# Patient Record
Sex: Male | Born: 1950
Health system: Southern US, Community
[De-identification: ages and names within clinical notes are randomized; demographics above are authoritative.]

## PROBLEM LIST (undated history)

## (undated) DIAGNOSIS — G4730 Sleep apnea, unspecified: Secondary | ICD-10-CM

## (undated) DIAGNOSIS — I469 Cardiac arrest, cause unspecified: Secondary | ICD-10-CM

## (undated) DIAGNOSIS — Z83719 Family history of colon polyps, unspecified: Secondary | ICD-10-CM

## (undated) DIAGNOSIS — E119 Type 2 diabetes mellitus without complications: Secondary | ICD-10-CM

## (undated) DIAGNOSIS — C801 Malignant (primary) neoplasm, unspecified: Secondary | ICD-10-CM

## (undated) DIAGNOSIS — I10 Essential (primary) hypertension: Secondary | ICD-10-CM

## (undated) DIAGNOSIS — I340 Nonrheumatic mitral (valve) insufficiency: Secondary | ICD-10-CM

## (undated) DIAGNOSIS — K1121 Acute sialoadenitis: Secondary | ICD-10-CM

## (undated) DIAGNOSIS — K5730 Diverticulosis of large intestine without perforation or abscess without bleeding: Secondary | ICD-10-CM

## (undated) DIAGNOSIS — J302 Other seasonal allergic rhinitis: Secondary | ICD-10-CM

## (undated) DIAGNOSIS — E785 Hyperlipidemia, unspecified: Secondary | ICD-10-CM

## (undated) DIAGNOSIS — T7840XA Allergy, unspecified, initial encounter: Secondary | ICD-10-CM

## (undated) DIAGNOSIS — Z8371 Family history of colonic polyps: Secondary | ICD-10-CM

## (undated) HISTORY — DX: Sleep apnea, unspecified: G47.30

## (undated) HISTORY — PX: NASAL SINUS SURGERY: SHX719

## (undated) HISTORY — DX: Hyperlipidemia, unspecified: E78.5

## (undated) HISTORY — PX: LUNG LOBECTOMY: SHX167

## (undated) HISTORY — DX: Other seasonal allergic rhinitis: J30.2

## (undated) HISTORY — DX: Family history of colon polyps, unspecified: Z83.719

## (undated) HISTORY — DX: Diverticulosis of large intestine without perforation or abscess without bleeding: K57.30

## (undated) HISTORY — DX: Allergy, unspecified, initial encounter: T78.40XA

## (undated) HISTORY — PX: HERNIA REPAIR: SHX51

## (undated) HISTORY — DX: Cardiac arrest, cause unspecified: I46.9

## (undated) HISTORY — PX: APPENDECTOMY: SHX54

## (undated) HISTORY — DX: Type 2 diabetes mellitus without complications: E11.9

## (undated) HISTORY — DX: Malignant (primary) neoplasm, unspecified: C80.1

## (undated) HISTORY — PX: OTHER SURGICAL HISTORY: SHX169

## (undated) HISTORY — DX: Essential (primary) hypertension: I10

## (undated) HISTORY — DX: Family history of colonic polyps: Z83.71

## (undated) HISTORY — DX: Nonrheumatic mitral (valve) insufficiency: I34.0

## (undated) HISTORY — PX: TONSILLECTOMY AND ADENOIDECTOMY: SUR1326

## (undated) HISTORY — PX: MIDDLE EAR SURGERY: SHX713

## (undated) HISTORY — DX: Acute sialoadenitis: K11.21

---

## 2004-01-31 ENCOUNTER — Ambulatory Visit: Payer: Self-pay | Admitting: Internal Medicine

## 2004-02-23 ENCOUNTER — Ambulatory Visit: Payer: Self-pay | Admitting: Internal Medicine

## 2004-03-15 ENCOUNTER — Ambulatory Visit: Payer: Self-pay | Admitting: Internal Medicine

## 2004-03-24 DIAGNOSIS — T8859XA Other complications of anesthesia, initial encounter: Secondary | ICD-10-CM

## 2004-03-24 HISTORY — DX: Other complications of anesthesia, initial encounter: T88.59XA

## 2004-04-01 ENCOUNTER — Ambulatory Visit: Payer: Self-pay | Admitting: Internal Medicine

## 2004-10-04 ENCOUNTER — Ambulatory Visit: Payer: Self-pay | Admitting: Internal Medicine

## 2004-10-18 ENCOUNTER — Ambulatory Visit: Payer: Self-pay | Admitting: Internal Medicine

## 2005-03-18 ENCOUNTER — Ambulatory Visit: Payer: Self-pay | Admitting: Internal Medicine

## 2005-07-30 ENCOUNTER — Ambulatory Visit: Payer: Self-pay | Admitting: Internal Medicine

## 2005-08-11 ENCOUNTER — Ambulatory Visit: Payer: Self-pay | Admitting: Internal Medicine

## 2006-03-12 ENCOUNTER — Ambulatory Visit: Payer: Self-pay | Admitting: Internal Medicine

## 2006-03-12 LAB — CONVERTED CEMR LAB
ALT: 30 units/L (ref 0–40)
AST: 26 units/L (ref 0–37)
BUN: 14 mg/dL (ref 6–23)
Creatinine, Ser: 1.1 mg/dL (ref 0.4–1.5)
Hgb A1c MFr Bld: 5.9 % (ref 4.6–6.0)

## 2006-03-24 HISTORY — PX: OTHER SURGICAL HISTORY: SHX169

## 2006-03-27 ENCOUNTER — Ambulatory Visit: Payer: Self-pay | Admitting: Internal Medicine

## 2006-06-06 ENCOUNTER — Ambulatory Visit: Payer: Self-pay | Admitting: Family Medicine

## 2006-08-28 ENCOUNTER — Ambulatory Visit: Payer: Self-pay | Admitting: Internal Medicine

## 2006-08-28 DIAGNOSIS — G4733 Obstructive sleep apnea (adult) (pediatric): Secondary | ICD-10-CM | POA: Insufficient documentation

## 2006-08-30 LAB — CONVERTED CEMR LAB
ALT: 33 units/L (ref 0–40)
AST: 19 units/L (ref 0–37)
Total CHOL/HDL Ratio: 6.8
VLDL: 40 mg/dL (ref 0–40)

## 2006-08-31 ENCOUNTER — Encounter: Payer: Self-pay | Admitting: Internal Medicine

## 2007-01-15 ENCOUNTER — Telehealth (INDEPENDENT_AMBULATORY_CARE_PROVIDER_SITE_OTHER): Payer: Self-pay | Admitting: *Deleted

## 2007-02-09 ENCOUNTER — Encounter: Payer: Self-pay | Admitting: Internal Medicine

## 2007-02-09 ENCOUNTER — Ambulatory Visit: Payer: Self-pay | Admitting: Cardiology

## 2007-02-09 ENCOUNTER — Encounter (INDEPENDENT_AMBULATORY_CARE_PROVIDER_SITE_OTHER): Payer: Self-pay | Admitting: Otolaryngology

## 2007-02-09 ENCOUNTER — Inpatient Hospital Stay (HOSPITAL_COMMUNITY): Admission: RE | Admit: 2007-02-09 | Discharge: 2007-02-10 | Payer: Self-pay | Admitting: Otolaryngology

## 2007-02-10 ENCOUNTER — Encounter (INDEPENDENT_AMBULATORY_CARE_PROVIDER_SITE_OTHER): Payer: Self-pay | Admitting: Otolaryngology

## 2007-02-12 ENCOUNTER — Ambulatory Visit: Payer: Self-pay | Admitting: Cardiology

## 2007-02-22 ENCOUNTER — Ambulatory Visit: Payer: Self-pay | Admitting: Internal Medicine

## 2007-02-22 DIAGNOSIS — I469 Cardiac arrest, cause unspecified: Secondary | ICD-10-CM | POA: Insufficient documentation

## 2007-02-22 DIAGNOSIS — Z8674 Personal history of sudden cardiac arrest: Secondary | ICD-10-CM | POA: Insufficient documentation

## 2007-02-28 LAB — CONVERTED CEMR LAB
CO2: 29 meq/L (ref 19–32)
Calcium: 9.7 mg/dL (ref 8.4–10.5)
Chloride: 107 meq/L (ref 96–112)
GFR calc non Af Amer: 73 mL/min
Glucose, Bld: 111 mg/dL — ABNORMAL HIGH (ref 70–99)

## 2007-03-01 ENCOUNTER — Encounter (INDEPENDENT_AMBULATORY_CARE_PROVIDER_SITE_OTHER): Payer: Self-pay | Admitting: *Deleted

## 2007-03-08 ENCOUNTER — Ambulatory Visit: Payer: Self-pay

## 2007-03-12 ENCOUNTER — Ambulatory Visit: Payer: Self-pay | Admitting: Cardiology

## 2007-03-19 ENCOUNTER — Encounter: Payer: Self-pay | Admitting: Internal Medicine

## 2007-03-26 ENCOUNTER — Ambulatory Visit: Payer: Self-pay | Admitting: Internal Medicine

## 2007-03-26 DIAGNOSIS — I10 Essential (primary) hypertension: Secondary | ICD-10-CM | POA: Insufficient documentation

## 2007-03-26 DIAGNOSIS — E8881 Metabolic syndrome: Secondary | ICD-10-CM

## 2007-03-26 DIAGNOSIS — E1159 Type 2 diabetes mellitus with other circulatory complications: Secondary | ICD-10-CM | POA: Insufficient documentation

## 2007-04-12 ENCOUNTER — Telehealth (INDEPENDENT_AMBULATORY_CARE_PROVIDER_SITE_OTHER): Payer: Self-pay | Admitting: *Deleted

## 2007-06-28 ENCOUNTER — Telehealth (INDEPENDENT_AMBULATORY_CARE_PROVIDER_SITE_OTHER): Payer: Self-pay | Admitting: *Deleted

## 2007-07-21 ENCOUNTER — Telehealth (INDEPENDENT_AMBULATORY_CARE_PROVIDER_SITE_OTHER): Payer: Self-pay | Admitting: *Deleted

## 2007-08-19 ENCOUNTER — Telehealth (INDEPENDENT_AMBULATORY_CARE_PROVIDER_SITE_OTHER): Payer: Self-pay | Admitting: *Deleted

## 2007-09-27 ENCOUNTER — Telehealth (INDEPENDENT_AMBULATORY_CARE_PROVIDER_SITE_OTHER): Payer: Self-pay | Admitting: *Deleted

## 2007-10-08 ENCOUNTER — Ambulatory Visit: Payer: Self-pay | Admitting: Internal Medicine

## 2007-10-08 DIAGNOSIS — K5730 Diverticulosis of large intestine without perforation or abscess without bleeding: Secondary | ICD-10-CM | POA: Insufficient documentation

## 2007-10-08 DIAGNOSIS — Z86010 Personal history of colonic polyps: Secondary | ICD-10-CM

## 2007-10-14 ENCOUNTER — Encounter: Payer: Self-pay | Admitting: Internal Medicine

## 2007-10-26 ENCOUNTER — Encounter (INDEPENDENT_AMBULATORY_CARE_PROVIDER_SITE_OTHER): Payer: Self-pay | Admitting: *Deleted

## 2007-10-26 ENCOUNTER — Ambulatory Visit: Payer: Self-pay | Admitting: Internal Medicine

## 2007-10-26 LAB — CONVERTED CEMR LAB
OCCULT 1: NEGATIVE
OCCULT 2: NEGATIVE
OCCULT 3: NEGATIVE

## 2008-02-01 ENCOUNTER — Ambulatory Visit: Payer: Self-pay | Admitting: Internal Medicine

## 2008-02-01 DIAGNOSIS — T887XXA Unspecified adverse effect of drug or medicament, initial encounter: Secondary | ICD-10-CM | POA: Insufficient documentation

## 2008-02-01 LAB — CONVERTED CEMR LAB
Creatinine, Ser: 1.2 mg/dL (ref 0.4–1.5)
HDL: 29.8 mg/dL — ABNORMAL LOW (ref >39.0)
Hgb A1c MFr Bld: 6.2 % — ABNORMAL HIGH (ref 4.6–6.0)
LDL Cholesterol: 70 mg/dL (ref 0–99)
Total CHOL/HDL Ratio: 4.3
VLDL: 28 mg/dL (ref 0–40)

## 2008-02-07 ENCOUNTER — Ambulatory Visit: Payer: Self-pay | Admitting: Internal Medicine

## 2008-02-07 DIAGNOSIS — R7989 Other specified abnormal findings of blood chemistry: Secondary | ICD-10-CM

## 2008-02-07 DIAGNOSIS — E790 Hyperuricemia without signs of inflammatory arthritis and tophaceous disease: Secondary | ICD-10-CM | POA: Insufficient documentation

## 2008-02-24 ENCOUNTER — Ambulatory Visit: Payer: Self-pay | Admitting: Internal Medicine

## 2008-02-28 ENCOUNTER — Ambulatory Visit: Payer: Self-pay | Admitting: Internal Medicine

## 2008-03-01 ENCOUNTER — Encounter (INDEPENDENT_AMBULATORY_CARE_PROVIDER_SITE_OTHER): Payer: Self-pay | Admitting: *Deleted

## 2008-03-07 ENCOUNTER — Ambulatory Visit: Payer: Self-pay | Admitting: Internal Medicine

## 2008-03-07 DIAGNOSIS — R930 Abnormal findings on diagnostic imaging of skull and head, not elsewhere classified: Secondary | ICD-10-CM | POA: Insufficient documentation

## 2008-03-08 ENCOUNTER — Encounter: Payer: Self-pay | Admitting: Internal Medicine

## 2008-03-08 ENCOUNTER — Encounter (INDEPENDENT_AMBULATORY_CARE_PROVIDER_SITE_OTHER): Payer: Self-pay | Admitting: *Deleted

## 2008-03-23 ENCOUNTER — Ambulatory Visit: Payer: Self-pay | Admitting: Internal Medicine

## 2008-03-23 ENCOUNTER — Telehealth (INDEPENDENT_AMBULATORY_CARE_PROVIDER_SITE_OTHER): Payer: Self-pay | Admitting: *Deleted

## 2008-03-23 DIAGNOSIS — I517 Cardiomegaly: Secondary | ICD-10-CM | POA: Insufficient documentation

## 2008-03-24 HISTORY — PX: OTHER SURGICAL HISTORY: SHX169

## 2008-03-29 ENCOUNTER — Encounter: Payer: Self-pay | Admitting: Internal Medicine

## 2008-03-29 ENCOUNTER — Telehealth (INDEPENDENT_AMBULATORY_CARE_PROVIDER_SITE_OTHER): Payer: Self-pay | Admitting: *Deleted

## 2008-03-29 ENCOUNTER — Ambulatory Visit: Payer: Self-pay

## 2008-03-30 ENCOUNTER — Encounter (INDEPENDENT_AMBULATORY_CARE_PROVIDER_SITE_OTHER): Payer: Self-pay | Admitting: *Deleted

## 2008-04-26 ENCOUNTER — Telehealth (INDEPENDENT_AMBULATORY_CARE_PROVIDER_SITE_OTHER): Payer: Self-pay | Admitting: *Deleted

## 2008-07-14 ENCOUNTER — Encounter (INDEPENDENT_AMBULATORY_CARE_PROVIDER_SITE_OTHER): Payer: Self-pay | Admitting: *Deleted

## 2008-07-31 ENCOUNTER — Ambulatory Visit: Payer: Self-pay | Admitting: Internal Medicine

## 2008-08-08 ENCOUNTER — Encounter (INDEPENDENT_AMBULATORY_CARE_PROVIDER_SITE_OTHER): Payer: Self-pay | Admitting: *Deleted

## 2008-08-08 ENCOUNTER — Ambulatory Visit: Payer: Self-pay | Admitting: Internal Medicine

## 2008-10-10 ENCOUNTER — Telehealth (INDEPENDENT_AMBULATORY_CARE_PROVIDER_SITE_OTHER): Payer: Self-pay | Admitting: *Deleted

## 2008-11-01 ENCOUNTER — Telehealth (INDEPENDENT_AMBULATORY_CARE_PROVIDER_SITE_OTHER): Payer: Self-pay | Admitting: *Deleted

## 2008-11-13 ENCOUNTER — Ambulatory Visit: Payer: Self-pay | Admitting: Internal Medicine

## 2008-11-13 LAB — CONVERTED CEMR LAB
HDL: 28.6 mg/dL — ABNORMAL LOW (ref >39.00)
Hgb A1c MFr Bld: 6 % (ref 4.6–6.5)
Microalb, Ur: 0.6 mg/dL (ref 0.0–1.9)
Total CHOL/HDL Ratio: 4
VLDL: 46.4 mg/dL — ABNORMAL HIGH (ref 0.0–40.0)

## 2008-11-14 ENCOUNTER — Ambulatory Visit: Payer: Self-pay | Admitting: Internal Medicine

## 2008-12-06 ENCOUNTER — Telehealth (INDEPENDENT_AMBULATORY_CARE_PROVIDER_SITE_OTHER): Payer: Self-pay | Admitting: *Deleted

## 2008-12-25 ENCOUNTER — Ambulatory Visit: Payer: Self-pay | Admitting: Family Medicine

## 2009-01-25 ENCOUNTER — Ambulatory Visit: Payer: Self-pay | Admitting: Internal Medicine

## 2009-03-12 ENCOUNTER — Encounter (INDEPENDENT_AMBULATORY_CARE_PROVIDER_SITE_OTHER): Payer: Self-pay | Admitting: *Deleted

## 2009-05-01 ENCOUNTER — Ambulatory Visit: Payer: Self-pay | Admitting: Family

## 2009-05-01 ENCOUNTER — Encounter (INDEPENDENT_AMBULATORY_CARE_PROVIDER_SITE_OTHER): Payer: Self-pay | Admitting: *Deleted

## 2009-05-21 ENCOUNTER — Encounter (INDEPENDENT_AMBULATORY_CARE_PROVIDER_SITE_OTHER): Payer: Self-pay

## 2009-05-22 ENCOUNTER — Ambulatory Visit: Payer: Self-pay | Admitting: Internal Medicine

## 2009-06-05 ENCOUNTER — Ambulatory Visit: Payer: Self-pay | Admitting: Internal Medicine

## 2009-06-06 ENCOUNTER — Encounter: Payer: Self-pay | Admitting: Internal Medicine

## 2009-08-13 ENCOUNTER — Ambulatory Visit: Payer: Self-pay | Admitting: Internal Medicine

## 2009-08-13 DIAGNOSIS — R7309 Other abnormal glucose: Secondary | ICD-10-CM

## 2009-08-13 DIAGNOSIS — R9431 Abnormal electrocardiogram [ECG] [EKG]: Secondary | ICD-10-CM

## 2009-08-16 ENCOUNTER — Ambulatory Visit: Payer: Self-pay | Admitting: Internal Medicine

## 2009-08-21 LAB — CONVERTED CEMR LAB
Albumin: 4.3 g/dL (ref 3.5–5.2)
Alkaline Phosphatase: 71 units/L (ref 39–117)
BUN: 16 mg/dL (ref 6–23)
Basophils Relative: 0.4 % (ref 0.0–3.0)
CO2: 26 meq/L (ref 19–32)
Calcium: 9.5 mg/dL (ref 8.4–10.5)
Cholesterol: 166 mg/dL (ref 0–200)
Creatinine, Ser: 1.1 mg/dL (ref 0.4–1.5)
Eosinophils Relative: 2.4 % (ref 0.0–5.0)
Glucose, Bld: 118 mg/dL — ABNORMAL HIGH (ref 70–99)
HCT: 41.3 % (ref 39.0–52.0)
HDL: 33 mg/dL — ABNORMAL LOW (ref >39.00)
Lymphs Abs: 1.6 10*3/uL (ref 0.7–4.0)
MCV: 89.9 fL (ref 78.0–100.0)
Monocytes Absolute: 0.4 10*3/uL (ref 0.1–1.0)
Platelets: 169 10*3/uL (ref 150.0–400.0)
Sodium: 141 meq/L (ref 135–145)
Total Protein: 6.8 g/dL (ref 6.0–8.3)
WBC: 6.3 10*3/uL (ref 4.5–10.5)

## 2010-01-07 ENCOUNTER — Ambulatory Visit: Payer: Self-pay | Admitting: Internal Medicine

## 2010-01-07 DIAGNOSIS — J019 Acute sinusitis, unspecified: Secondary | ICD-10-CM | POA: Insufficient documentation

## 2010-01-11 ENCOUNTER — Telehealth: Payer: Self-pay | Admitting: Internal Medicine

## 2010-01-24 ENCOUNTER — Encounter: Payer: Self-pay | Admitting: Internal Medicine

## 2010-01-30 ENCOUNTER — Telehealth: Payer: Self-pay | Admitting: Internal Medicine

## 2010-02-18 ENCOUNTER — Ambulatory Visit: Payer: Self-pay | Admitting: Internal Medicine

## 2010-03-11 ENCOUNTER — Telehealth: Payer: Self-pay | Admitting: Internal Medicine

## 2010-03-13 ENCOUNTER — Ambulatory Visit: Payer: Self-pay | Admitting: Cardiology

## 2010-04-21 LAB — CONVERTED CEMR LAB
Alkaline Phosphatase: 64 units/L (ref 39–117)
Basophils Absolute: 0 10*3/uL (ref 0.0–0.1)
Bilirubin, Direct: 0.1 mg/dL (ref 0.0–0.3)
Cholesterol: 133 mg/dL (ref 0–200)
GFR calc Af Amer: 73 mL/min
GFR calc non Af Amer: 60 mL/min
Glucose, Bld: 111 mg/dL — ABNORMAL HIGH (ref 70–99)
HCT: 38.9 % — ABNORMAL LOW (ref 39.0–52.0)
HDL: 30.9 mg/dL — ABNORMAL LOW (ref >39.0)
LDL Cholesterol: 69 mg/dL (ref 0–99)
MCHC: 34.5 g/dL (ref 30.0–36.0)
Monocytes Absolute: 0.4 10*3/uL (ref 0.1–1.0)
Monocytes Relative: 7.7 % (ref 3.0–12.0)
PSA: 1.09 ng/mL (ref 0.10–4.00)
Platelets: 158 10*3/uL (ref 150–400)
Potassium: 4.3 meq/L (ref 3.5–5.1)
RDW: 11.7 % (ref 11.5–14.6)
Sodium: 138 meq/L (ref 135–145)
TSH: 1.27 microintl units/mL (ref 0.35–5.50)
Total Bilirubin: 0.8 mg/dL (ref 0.3–1.2)
Total CHOL/HDL Ratio: 4.3
Total Protein: 6.5 g/dL (ref 6.0–8.3)
Triglycerides: 168 mg/dL — ABNORMAL HIGH (ref 0–149)
Uric Acid, Serum: 8.1 mg/dL — ABNORMAL HIGH (ref 4.0–7.8)

## 2010-04-25 NOTE — Assessment & Plan Note (Signed)
Summary: sinus infection//lch   Vital Signs:  Patient profile:   60 year old male Weight:      237.4 pounds BMI:     34.68 Temp:     99.6 degrees F oral Pulse rate:   72 / minute Resp:     16 per minute BP sitting:   122 / 80  (left arm) Cuff size:   large  Vitals Entered By: Shonna Chock CMA (January 07, 2010 4:42 PM) CC: Sinus Infection: scratchy throat, discolored sinus drainage, chest congestion and cough, URI symptoms   CC:  Sinus Infection: scratchy throat, discolored sinus drainage, chest congestion and cough, and URI symptoms.  History of Present Illness: URI Symptoms      This is a 60 year old man who presents with URI symptoms since 10/14 . Initially he had a ST then head congestion.  The patient reports nasal congestion, purulent nasal discharge, sore throat, and dry cough, but denies earache.  Associated symptoms include low-grade fever (<100.5 degrees).  The patient denies dyspnea and wheezing.  The patient denies headache ,bilateral facial pain, tooth pain, Strep exposure,  or tender adenopathy. Rx: Zyrtec, Advil, Riccola lozenges   Current Medications (verified): 1)  Vytorin 10-20 Mg Tabs (Ezetimibe-Simvastatin) .... Take One Tablet At Bedtime 2)  Labetalol Hcl 300 Mg  Tabs (Labetalol Hcl) .... 1/2 Two Times A Day 3)  Bayer Low Strength 81 Mg Tbec (Aspirin) .Marland Kitchen.. 1 By Mouth Once Daily 4)  Aldactone 25 Mg Tabs (Spironolactone) .Marland Kitchen.. 1 By Mouth Two Times A Day To Keep B/p Average 135/85 5)  Micardis 80 Mg Tabs (Telmisartan) .Marland Kitchen.. 1 By Mouth Once Daily  Allergies: 1)  ! Pcn 2)  ! Pcn 3)  ! Tiazac (Diltiazem Hcl Er Beads) 4)  * Tiazac  Physical Exam  General:  well-nourished,in no acute distress; alert,appropriate and cooperative throughout examination Ears:  External ear exam shows no significant lesions or deformities.  Otoscopic examination reveals clear canals, tympanic membranes are intact bilaterally without bulging, retraction, inflammation or discharge. Hearing  is grossly normal bilaterally.Minor TM scarring Nose:  External nasal examination shows no deformity or inflammation. Nasal mucosa are pink and moist without lesions or exudates. Slight septal deviation Mouth:  Oral mucosa and oropharynx without lesions or exudates.  Teeth in good repair. Mild pharyngeal erythema.   Hoarse Lungs:  Normal respiratory effort, chest expands symmetrically. Lungs are clear to auscultation, no crackles or wheezes. Heart:  Normal rate and regular rhythm. S1 and S2 normal without gallop, murmur, click, rub .S4  Cervical Nodes:  No lymphadenopathy noted Axillary Nodes:  No palpable lymphadenopathy   Impression & Recommendations:  Problem # 1:  SINUSITIS- ACUTE-NOS (ICD-461.9)  His updated medication list for this problem includes:    Smz-tmp Ds 800-160 Mg Tabs (Sulfamethoxazole-trimethoprim) .Marland Kitchen... 1 two times a day with 8 oz of water  Complete Medication List: 1)  Vytorin 10-20 Mg Tabs (Ezetimibe-simvastatin) .... Take one tablet at bedtime 2)  Labetalol Hcl 300 Mg Tabs (Labetalol hcl) .... 1/2 two times a day 3)  Bayer Low Strength 81 Mg Tbec (Aspirin) .Marland Kitchen.. 1 by mouth once daily 4)  Aldactone 25 Mg Tabs (Spironolactone) .Marland Kitchen.. 1 by mouth two times a day to keep b/p average 135/85 5)  Micardis 80 Mg Tabs (Telmisartan) .Marland Kitchen.. 1 by mouth once daily 6)  Smz-tmp Ds 800-160 Mg Tabs (Sulfamethoxazole-trimethoprim) .Marland Kitchen.. 1 two times a day with 8 oz of water  Patient Instructions: 1)  Netri pot once daily until sinuses are  clear. 2)  Drink as much  NON dairy fluid as you can tolerate for the next few days. Nasonex spray two times a day as "crossover " technique. Prescriptions: SMZ-TMP DS 800-160 MG TABS (SULFAMETHOXAZOLE-TRIMETHOPRIM) 1 two times a day with 8 oz of water  #20 x 0   Entered and Authorized by:   Marga Melnick MD   Signed by:   Marga Melnick MD on 01/07/2010   Method used:   Faxed to ...       [facility]* (retail)       [address]        [place], [place]  [address]       Ph: [phone]       Fax: [phone]   RxID:   [id number]    Orders Added: 1)  Est. Patient Level III [03474]

## 2010-04-25 NOTE — Letter (Signed)
Summary: [facility] Instructions  [facility]  [address] [place], [place] [address]   Phone: [phone]  Fax: [phone]       JORGEN WOLFINGER    Apr 22, 1950    MRN: [id number]       Procedure Day /Date: Tuesday 06/05/09     Arrival Time:  9:30 am     Procedure Time: 10:30 am     Location of Procedure:                    _x_  [facility] (4th Floor)        PREPARATION FOR COLONOSCOPY WITH MIRALAX  Starting 5 days prior to your procedure  05/31/09 do not eat nuts, seeds, popcorn, corn, beans, peas,  salads, or any raw vegetables.  Do not take any fiber supplements (e.g. Metamucil, Citrucel, and Benefiber). ____________________________________________________________________________________________________   THE DAY BEFORE YOUR PROCEDURE         DATE: 06/04/09 DAY: Monday  1   Drink clear liquids the entire day-NO SOLID FOOD  2   Do not drink anything colored red or purple.  Avoid juices with pulp.  No orange juice.  3   Drink at least 64 oz. (8 glasses) of fluid/clear liquids during the day to prevent dehydration and help the prep work efficiently.  CLEAR LIQUIDS INCLUDE: Water Jello Ice Popsicles Tea (sugar ok, no milk/cream) Powdered fruit flavored drinks Coffee (sugar ok, no milk/cream) Gatorade Juice: apple, white grape, white cranberry  Lemonade Clear bullion, consomm, broth Carbonated beverages (any kind) Strained chicken noodle soup Hard Candy  4   Mix the entire bottle of Miralax with 64 oz. of Gatorade/Powerade in the morning and put in the refrigerator to chill.  5   At 3:00 pm take 2 Dulcolax/Bisacodyl tablets.  6   At 4:30 pm take one Reglan/Metoclopramide tablet.  7  Starting at 5:00 pm drink one 8 oz glass of the Miralax mixture every 15-20 minutes until you have finished drinking the entire 64 oz.  You should finish drinking prep around 7:30 or 8:00 pm.  8   If you are nauseated, you may take the 2nd  Reglan/Metoclopramide tablet at 6:30 pm.        9    At 8:00 pm take 2 more DULCOLAX/Bisacodyl tablets.     THE DAY OF YOUR PROCEDURE      DATE:  06/05/09 DAY: Tuesday  You may drink clear liquids until 8:30 am (2 HOURS BEFORE PROCEDURE).   MEDICATION INSTRUCTIONS  Unless otherwise instructed, you should take regular prescription medications with a small sip of water as early as possible the morning of your procedure.   Additional medication instructions: Do not take fluid pill am of procedure         OTHER INSTRUCTIONS  You will need a responsible adult at least 61 years of age to accompany you and drive you home.   This person must remain in the waiting room during your procedure.  Wear loose fitting clothing that is easily removed.  Leave jewelry and other valuables at home.  However, you may wish to bring a book to read or an iPod/MP3 player to listen to music as you wait for your procedure to start.  Remove all body piercing jewelry and leave at home.  Total time from sign-in until discharge is approximately 2-3 hours.  You should go home directly after your procedure and rest.  You can resume normal activities the day after  your procedure.  The day of your procedure you should not:   Drive   Make legal decisions   Operate machinery   Drink alcohol   Return to work  You will receive specific instructions about eating, activities and medications before you leave.   The above instructions have been reviewed and explained to me by   Ulis Rias RN  May 22, 2009 8:31 AM     I fully understand and can verbalize these instructions _____________________________ Date _______

## 2010-04-25 NOTE — Procedures (Signed)
Summary: Colonoscopy  Patient: Jonathon Armstrong Note: All result statuses are Final unless otherwise noted.  Tests: (1) Colonoscopy (COL)   COL Colonoscopy           DONE     [facility]     [address]     [place], [place]  [address]           COLONOSCOPY PROCEDURE REPORT           PATIENT:  Jonathon, Armstrong  MR#:  [id number]     BIRTHDATE:  06-20-1950, 58 yrs. old  GENDER:  male           ENDOSCOPIST:  Hedwig Morton. Juanda Chance, MD     Referred by:           PROCEDURE DATE:  06/05/2009     PROCEDURE:  Colonoscopy [address]     ASA CLASS:  Class I     INDICATIONS:  adenomatous polyp in 03/2004           MEDICATIONS:   Versed 9 mg, Fentanyl 100 mcg           DESCRIPTION OF PROCEDURE:   After the risks benefits and     alternatives of the procedure were thoroughly explained, informed     consent was obtained.  Digital rectal exam was performed and     revealed no rectal masses.   The LB [id number] endoscope     was introduced through the anus and advanced to the cecum, which     was identified by both the appendix and ileocecal valve, without     limitations.  The quality of the prep was good, using MiraLax.     The instrument was then slowly withdrawn as the colon was fully     examined.     <<PROCEDUREIMAGES>>           FINDINGS:  Two polyps were found in the sigmoid colon. at 30 and     35 cm 2-3 mm sessile polyps removed The polyps were removed using     cold biopsy forceps (see image1).  Mild diverticulosis was found     in the sigmoid colon (see image3).  This was otherwise a normal     examination of the colon (see image4, image5, image6, and image9).     Retroflexed views in the rectum revealed no abnormalities.    The     scope was then withdrawn from the patient and the procedure     completed.           COMPLICATIONS:  None           ENDOSCOPIC IMPRESSION:     1) Two polyps in the sigmoid colon     2) Mild diverticulosis in the sigmoid colon     3) Otherwise  normal examination     RECOMMENDATIONS:     1) Await pathology results     2) high fiber diet           REPEAT EXAM:  In 5 - 7 year(s) for.  recall will depend on type of     polyps           ______________________________     Hedwig Morton. Juanda Chance, MD           CC:           n.     eSIGNED:   Hedwig Morton. Gwenlyn Hottinger at 06/05/2009 11:23 AM  Jonathon, Armstrong, [id number]  Note: An exclamation mark (!) indicates a result that was not dispersed into the flowsheet. Document Creation Date: 06/05/2009 11:23 AM _______________________________________________________________________  (1) Order result status: Final Collection or observation date-time: 06/05/2009 11:15 Requested date-time:  Receipt date-time:  Reported date-time:  Referring Physician:   Ordering Physician: Lina Sar [phone]) Specimen Source:  Source: Launa Grill Order Number: [id number] Lab site:   Appended Document: Colonoscopy     Procedures Next Due Date:    Colonoscopy: 05/2014

## 2010-04-25 NOTE — Letter (Signed)
Summary: Patient Notice- Polyp Results  [facility]  [address] [place], [place] [address]   Phone: [phone]  Fax: [phone]        June 06, 2009 MRN: [id number]    Jonathon Armstrong [address] [place], [place]  [address]    Dear [name],  I am pleased to inform you that the colon polyp(s) removed during your recent colonoscopy was (were) found to be benign (no cancer detected) upon pathologic examination.The polyp was  adenomatous ( precancerous).  I recommend you have a repeat colonoscopy examination in 5_ years to look for recurrent polyps, as having colon polyps increases your risk for having recurrent polyps or even colon cancer in the future.  Should you develop new or worsening symptoms of abdominal pain, bowel habit changes or bleeding from the rectum or bowels, please schedule an evaluation with either your primary care physician or with me.  Additional information/recommendations:  _x_ No further action with gastroenterology is needed at this time. Please      follow-up with your primary care physician for your other healthcare      needs.  __ Please call [phone] to schedule a return visit to review your      situation.  __ Please keep your follow-up visit as already scheduled.  __ Continue treatment plan as outlined the day of your exam.  Please call us if you are having persistent problems or have questions about your condition that have not been fully answered at this time.  Sincerely,  Hart Carwin MD  This letter has been electronically signed by your physician.  Appended Document: Patient Notice- Polyp Results Letter mailed 3.21.11

## 2010-04-25 NOTE — Assessment & Plan Note (Signed)
Summary: FLU SHOT/RH.......   Nurse Visit  CC: Flu shot./kb   Allergies: 1)  ! Pcn 2)  ! Pcn 3)  ! Tiazac (Diltiazem Hcl Er Beads) 4)  * Tiazac  Orders Added: 1)  Admin 1st Vaccine [90471] 2)  Flu Vaccine 72yrs + [[address]]      Flu Vaccine Consent Questions     Do you have a history of severe allergic reactions to this vaccine? no    Any prior history of allergic reactions to egg and/or gelatin? no    Do you have a sensitivity to the preservative Thimersol? no    Do you have a past history of Guillan-Barre Syndrome? no    Do you currently have an acute febrile illness? no    Have you ever had a severe reaction to latex? no    Vaccine information given and explained to patient? yes    Are you currently pregnant? no    Lot Number:[id number]   Exp Date:09/21/2010   Site Given  Left Deltoid IMu

## 2010-04-25 NOTE — Letter (Signed)
Summary: [facility]   Imported By: Lanelle Bal 02/05/2010 10:34:34  _____________________________________________________________________  External Attachment:    Type:   Image     Comment:   External Document

## 2010-04-25 NOTE — Miscellaneous (Signed)
Summary: Lec previsit  Clinical Lists Changes  Medications: Added new medication of MIRALAX   POWD (POLYETHYLENE GLYCOL 3350) As per prep  instructions. - Signed Added new medication of DULCOLAX 5 MG  TBEC (BISACODYL) Day before procedure take 2 at 3pm and 2 at 8pm. - Signed Added new medication of REGLAN 10 MG  TABS (METOCLOPRAMIDE HCL) As per prep instructions. - Signed Rx of MIRALAX   POWD (POLYETHYLENE GLYCOL 3350) As per prep  instructions.;  #255gm x 0;  Signed;  Entered by: Ulis Rias RN;  Authorized by: Hart Carwin MD;  Method used: Electronically to [facility] [address], [place], [place]  [address], Ph: [phone], Fax: [phone] Rx of DULCOLAX 5 MG  TBEC (BISACODYL) Day before procedure take 2 at 3pm and 2 at 8pm.;  #4 x 0;  Signed;  Entered by: Ulis Rias RN;  Authorized by: Hart Carwin MD;  Method used: Electronically to [facility]*, [address] [place], [place]  [address], Ph: [phone], Fax: [phone] Rx of REGLAN 10 MG  TABS (METOCLOPRAMIDE HCL) As per prep instructions.;  #2 x 0;  Signed;  Entered by: Ulis Rias RN;  Authorized by: Hart Carwin MD;  Method used: Electronically to [facility]*, [address], [place], [place]  [address], Ph: [phone], Fax: [phone] Observations: Added new observation of ALLERGY REV: Done (05/22/2009 8:12)    Prescriptions: REGLAN 10 MG  TABS (METOCLOPRAMIDE HCL) As per prep instructions.  #2 x 0   Entered by:   Ulis Rias RN   Authorized by:   Hart Carwin MD   Signed by:   Ulis Rias RN on 05/22/2009   Method used:   Electronically to        [facility]* (retail)       [address]       [place], [place]  [address]       Ph: [phone]       Fax: [phone]   RxID:   [id number] DULCOLAX 5 MG  TBEC (BISACODYL) Day before procedure take 2 at 3pm and 2 at 8pm.  #4 x 0   Entered by:   Ulis Rias RN   Authorized by:   Hart Carwin MD   Signed by:   Ulis Rias RN on 05/22/2009   Method used:   Electronically to        [facility]* (retail)       [address]       [place], [place]  [address]       Ph: [phone]       Fax: [phone]   RxID:   [id number] MIRALAX   POWD (POLYETHYLENE GLYCOL 3350) As per prep  instructions.  #255gm x 0   Entered by:   Ulis Rias RN   Authorized by:   Hart Carwin MD   Signed by:   Ulis Rias RN on 05/22/2009   Method used:   Electronically to        [facility]* (retail)       [address]       [place], [place]  [address]       Ph: [phone]       Fax: [phone]   RxID:   [id number]

## 2010-04-25 NOTE — Assessment & Plan Note (Signed)
Summary: cpx/cbs   Vital Signs:  Patient profile:   60 year old male Height:      69.5 inches Weight:      230.8 pounds BMI:     33.72 Temp:     98.8 degrees F oral Pulse rate:   76 / minute Resp:     14 per minute BP sitting:   142 / 90  (left arm) Cuff size:   large  Vitals Entered By: Shonna Chock (Aug 13, 2009 1:16 PM)  CC: CPX and refill meds (out of meds x 1 week), General Medical Evaluation Comments REVIEWED MED LIST, PATIENT AGREED DOSE AND INSTRUCTION CORRECT    CC:  CPX and refill meds (out of meds x 1 week) and General Medical Evaluation.  History of Present Illness: Jonathon Armstrong is here for a physical; he is asymptomatic .See BP; it averages 120/85 @ home . He has been out of meds for > 1 week.  Preventive Screening-Counseling & Management  Caffeine-Diet-Exercise     Does Patient Exercise: no  Allergies: 1)  ! Pcn 2)  ! Pcn 3)  ! Tiazac (Diltiazem Hcl Er Beads) 4)  * Tiazac  Past History:  Past Medical History: Hyperlipidemia Hypertension Elevated Glucose(Fasting Hyperglycemia) Obstructive sleep apnea, CPAP  Asystole , "vagal response " post nasal polypectomy Colonic polyps, hx of Diverticulosis, colon Mitral Regurgitation , mild & Aortic Valve calcification on 2D ECHO; SBE Prophylaxis  Past Surgical History: Sinus surgery, Polypectomy Parotitis, no surgery Herniorraphy x 2 L  TM replacement Appendectomy Colonoscopy 2006 & 2011 : polyps, Tics, Dr Juanda Chance, due 2015 ; Asystole post nasal polypectomy 02/09/2007  Family History: Father: MI in 68s, HTN,dementia Mother: angina,CABG,MI late 60's, cancer in  liver, Multiple Myeloma ZOX:WRUEAV in bone   Social History: Never Smoked Alcohol use-yes-socially No diet Regular exercise-no Retired Does Patient Exercise:  no  Review of Systems  The patient denies anorexia, fever, weight loss, weight gain, vision loss, decreased hearing, hoarseness, chest pain, syncope, dyspnea on exertion, peripheral  edema, prolonged cough, headaches, hemoptysis, abdominal pain, melena, hematochezia, severe indigestion/heartburn, hematuria, suspicious skin lesions, depression, unusual weight change, abnormal bleeding, enlarged lymph nodes, and angioedema.    Physical Exam  General:  well-nourished; alert,appropriate and cooperative throughout examination Head:  Normocephalic and atraumatic without obvious abnormalities. No apparent alopecia Eyes:  No corneal or conjunctival inflammation noted.  Perrla. Funduscopic exam benign, without hemorrhages, exudates or papilledema.  Ears:  External ear exam shows no significant lesions or deformities.  Otoscopic examination reveals clear canals, tympanic membranes are intact bilaterally without bulging, retraction, inflammation or discharge. Hearing is grossly normal bilaterally. Nose:  External nasal examination shows no deformity or inflammation. Nasal mucosa are pink and moist without lesions or exudates. Mouth:  Oral mucosa and oropharynx without lesions or exudates.  Teeth in good repair. Neck:  No deformities, masses, or tenderness noted. Lungs:  Normal respiratory effort, chest expands symmetrically. Lungs are clear to auscultation, no crackles or wheezes. Heart:  Normal rate and regular rhythm.  S2 accentuated. No gallop, murmur, click, rub or other extra sounds. Abdomen:  Bowel sounds positive,abdomen soft and non-tender without masses, organomegaly or hernias noted. Rectal:  Colonscopy 05/2009 Genitalia:  Testes bilaterally descended without nodularity, tenderness or masses. No scrotal masses or lesions. No penis lesions or urethral discharge. L varicocele.   Msk:  No deformity or scoliosis noted of thoracic or lumbar spine.   Pulses:  R and L carotid,radial,dorsalis pedis and posterior tibial pulses are full and equal bilaterally  Extremities:  No clubbing, cyanosis, edema. DIP  deformity noted  3rd R finger with normal full range of motion of all joints.     Neurologic:  alert & oriented X3 and DTRs symmetrical and 0-1/2+ Skin:  Intact without suspicious lesions or rashes Cervical Nodes:  No lymphadenopathy noted Axillary Nodes:  No palpable lymphadenopathy Inguinal Nodes:  No significant adenopathy Psych:  memory intact for recent and remote, normally interactive, and good eye contact.     Impression & Recommendations:  Problem # 1:  ROUTINE GENERAL MEDICAL EXAM@HEALTH  CARE FACL (ICD-V70.0)  Orders: EKG w/ Interpretation ([address])  Problem # 2:  HYPERTENSION, ESSENTIAL NOS (ICD-401.9)  His updated medication list for this problem includes:    Labetalol Hcl 300 Mg Tabs (Labetalol hcl) .Marland Kitchen... 1/2 two times a day    Aldactone 25 Mg Tabs (Spironolactone) .Marland Kitchen... 1 by mouth two times a day to keep b/p average 135/85    Micardis 80 Mg Tabs (Telmisartan) .Marland Kitchen... 1 by mouth once daily  Orders: EKG w/ Interpretation ([address])  Problem # 3:  HYPERGLYCEMIA, FASTING (ICD-790.29)  Problem # 4:  NONSPECIFIC ABNORMAL ELECTROCARDIOGRAM (ICD-794.31)  in context of elevated BP off meds  Orders: EKG w/ Interpretation ([address])  Problem # 5:  HYPERURICEMIA, ASYMPTOMATIC (ICD-790.6)  Problem # 6:  CARDIOMEGALY, MILD (ICD-429.3)  Mild MR  Orders: EKG w/ Interpretation ([address])  Problem # 7:  OBSTRUCTIVE SLEEP APNEA (ICD-327.23) on CPAP  Complete Medication List: 1)  Vytorin 10-20 Mg Tabs (Ezetimibe-simvastatin) .... Take one tablet at bedtime 2)  Labetalol Hcl 300 Mg Tabs (Labetalol hcl) .... 1/2 two times a day 3)  Bayer Low Strength 81 Mg Tbec (Aspirin) .Marland Kitchen.. 1 by mouth once daily 4)  Aldactone 25 Mg Tabs (Spironolactone) .Marland Kitchen.. 1 by mouth two times a day to keep b/p average 135/85 5)  Micardis 80 Mg Tabs (Telmisartan) .Marland Kitchen.. 1 by mouth once daily  Patient Instructions: 1)  It is important that you exercise regularly at least 20 minutes 5 times a week. If you develop chest pain, have severe difficulty breathing, or feel very tired , stop exercising  immediately and seek medical attention.Take an Aspirin every day.Please schedule fasting labs: 2)  BMP ;Hepatic Panel ;Lipid Panel ;TSH ;CBC w/ Diff ;PSA;HbgA1C . Prescriptions: VYTORIN 10-20 MG TABS (EZETIMIBE-SIMVASTATIN) TAKE ONE TABLET AT BEDTIME  #30 Each x 11   Entered and Authorized by:   Marga Melnick MD   Signed by:   Marga Melnick MD on 08/13/2009   Method used:   Print then Give to Patient   RxID:   [id number] MICARDIS 80 MG TABS (TELMISARTAN) 1 by mouth once daily  #90 Each x 3   Entered and Authorized by:   Marga Melnick MD   Signed by:   Marga Melnick MD on 08/13/2009   Method used:   Faxed to ...       [facility]* (retail)       [address]       [place], [place]  [address]       Ph: [phone]       Fax: [phone]   RxID:   [id number] ALDACTONE 25 MG TABS (SPIRONOLACTONE) 1 by mouth two times a day TO KEEP B/P AVERAGE 135/85  #180 Each x 3   Entered and Authorized by:   Marga Melnick MD   Signed by:   Marga Melnick MD on 08/13/2009   Method used:   Faxed to ...       [facility]* (retail)       [address]       [place], [place]  [address]       Ph: [phone]       Fax: [phone]   RxID:   [id number] LABETALOL HCL 300 MG  TABS (LABETALOL HCL) 1/2 two times a day  #90 x 3   Entered and Authorized by:   Marga Melnick MD   Signed by:   Marga Melnick MD on 08/13/2009   Method used:   Faxed to ...       [facility]* (retail)       [address]       [place], [place]  [address]       Ph: [phone]       Fax: [phone]   RxID:   [id number]

## 2010-04-25 NOTE — Assessment & Plan Note (Signed)
Summary: bronchtsis/kdc   Vital Signs:  Patient profile:   60 year old male Weight:      229 pounds O2 Sat:      97 % on Room air Temp:     98.0 degrees F oral Pulse rate:   67 / minute BP sitting:   130 / 90  (left arm)  Vitals Entered By: Jonathon Armstrong CMA (May 01, 2009 3:41 PM)  O2 Flow:  Room air CC: ? bronchitis,sore throat, dry cough, chest congestion Comments REVIEWED MED LIST, PATIENT AGREED DOSE AND INSTRUCTION CORRECT    CC:  ? bronchitis, sore throat, dry cough, and chest congestion.  History of Present Illness: Jonathon Armstrong is a 60 year old male who presents with sore throat on saturday.  Yesterday he developed a deep cough.  Voice is hoarse.  Notes that he has history of mitral valve problems for which he takes dental prophylaxis. He has an upcoming dental procedure pending.  Denies fever, has taken zyrtec and mucinex-D, and cough syrup without any improvment.  Allergies: 1)  ! Pcn 2)  ! Pcn 3)  ! Tiazac (Diltiazem Hcl Er Beads) 4)  * Tiazac  Physical Exam  General:  Well-developed,well-nourished,in no acute distress; alert,appropriate and cooperative throughout examination Eyes:  PERRLA Ears:  bilateral TM scarring without erythema or bulging Mouth:  Oral mucosa and oropharynx without lesions or exudates.  Teeth in good repair. Neck:  No deformities, masses, or tenderness noted. Lungs:  Normal respiratory effort, chest expands symmetrically. Lungs are clear to auscultation, no crackles or wheezes. Heart:  Normal rate and regular rhythm. S1 and S2 normal without gallop, murmur, click, rub or other extra sounds.   Impression & Recommendations:  Problem # 1:  BRONCHITIS, MILD (ICD-490) Assessment New Will plan to treat patient with zithromax.  Refill promethazine-codeine. Patient instructed to call if symptoms worsen or do not improve in 1 week. His updated medication list for this problem includes:    Promethazine-codeine 6.25-10 Mg/92ml Syrp  (Promethazine-codeine) .Marland Kitchen... 1 tsp q 6 hrs  as needed    Asmanex 120 Metered Doses 220 Mcg/inh Aepb (Mometasone furoate) .Marland Kitchen... 2 inh every 12 hours    Proair Hfa 108 (90 Base) Mcg/act Aers (Albuterol sulfate) .Marland Kitchen... 1-2 every 4 hours as needed  Complete Medication List: 1)  Vytorin 10-20 Mg Tabs (Ezetimibe-simvastatin) .... Take one tablet at bedtime 2)  Labetalol Hcl 300 Mg Tabs (Labetalol hcl) .... 1/2 two times a day 3)  Promethazine-codeine 6.25-10 Mg/3ml Syrp (Promethazine-codeine) .Marland Kitchen.. 1 tsp q 6 hrs  as needed 4)  Freestyle Test Strp (Glucose blood) .... Once daily 5)  Freestyle Lancets Misc (Lancets) .... Once daily 6)  Bayer Low Strength 81 Mg Tbec (Aspirin) .Marland Kitchen.. 1 by mouth once daily 7)  Aldactone 25 Mg Tabs (Spironolactone) .Marland Kitchen.. 1 by mouth two times a day to keep b/p average 135/85 8)  Asmanex 120 Metered Doses 220 Mcg/inh Aepb (Mometasone furoate) .... 2 inh every 12 hours 9)  Proair Hfa 108 (90 Base) Mcg/act Aers (Albuterol sulfate) .Marland Kitchen.. 1-2 every 4 hours as needed 10)  Micardis 80 Mg Tabs (Telmisartan) .Marland Kitchen.. 1 by mouth once daily 11)  Zithromax Z-pak 250 Mg Tabs (Azithromycin) .... 2 tabs by mouth today, then one tab by mouth daily x 4 days  Patient Instructions: 1)  Please call if symptoms worsen or have not resolved in 1 week. Prescriptions: PROMETHAZINE-CODEINE 6.25-10 MG/5ML SYRP (PROMETHAZINE-CODEINE) 1 tsp q 6 hrs  as needed  #150cc x 0  Entered and Authorized by:   Lemont Fillers FNP   Signed by:   Lemont Fillers FNP on 05/01/2009   Method used:   Print then Give to Patient   RxID:   [id number] ZITHROMAX Z-PAK 250 MG TABS (AZITHROMYCIN) 2 tabs by mouth today, then one tab by mouth daily x 4 days  #1 pack x 0   Entered and Authorized by:   Lemont Fillers FNP   Signed by:   Lemont Fillers FNP on 05/01/2009   Method used:   Print then Give to Patient   RxID:   [id number]

## 2010-04-25 NOTE — Letter (Signed)
Summary: Colonoscopy Letter  [facility]  [address] [place], [place] [address]   Phone: [phone]  Fax: [phone]      May 01, 2009 MRN: [id number]   VIJAY DURFLINGER [address] [place], [place]  [address]   Dear Mr. Hannay,   According to your medical record, it is time for you to schedule a Colonoscopy. The American Cancer Society recommends this procedure as a method to detect early colon cancer. Patients with a family history of colon cancer, or a personal history of colon polyps or inflammatory bowel disease are at increased risk.  This letter has beeen generated based on the recommendations made at the time of your procedure. If you feel that in your particular situation this may no longer apply, please contact our office.  Please call our office at [phone] to schedule this appointment or to update your records at your earliest convenience.  Thank you for cooperating with [place] to provide you with the very best care possible.   Sincerely,  Hedwig Morton. Juanda Chance, M.D.  [facility] [phone]

## 2010-04-25 NOTE — Progress Notes (Signed)
Summary: ABX request  Phone Note Call from Patient Call back at Home Phone [phone]   Details for Reason: [facility] Summary of Call: Patient called noting that sinus infection has returned. Can he be given another round of ABX? Repeat the same of use different?  Patient was seen on 01/07/10. Please advise. Initial call taken by: Lucious Groves CMA,  January 30, 2010 1:10 PM  Follow-up for Phone Call        see Rx Follow-up by: Marga Melnick MD,  January 30, 2010 2:26 PM  Additional Follow-up for Phone Call Additional follow up Details #1::        Patient notified. Additional Follow-up by: Lucious Groves CMA,  January 30, 2010 2:28 PM    New/Updated Medications: METRONIDAZOLE 500 MG TABS (METRONIDAZOLE) 1 three times a day ; avoid alcoho Prescriptions: METRONIDAZOLE 500 MG TABS (METRONIDAZOLE) 1 three times a day ; avoid alcoho  #21 x 0   Entered and Authorized by:   Marga Melnick MD   Signed by:   Marga Melnick MD on 01/30/2010   Method used:   Faxed to ...       [facility]* (retail)       [address]       [place], [place]  [id number]       Ph: [phone]       Fax: [phone]   RxID:   [id number]

## 2010-04-25 NOTE — Progress Notes (Signed)
Summary: ABX request  Phone Note Call from Patient   Summary of Call: Patient left message on triage that he again is battling a sinus inf and would like that 2nd ABX he received previously sent to [facility]. Pt was given METRONIDAZOLE 500 on 11/9.  Please advise. Initial call taken by: Lucious Groves CMA,  March 11, 2010 11:59 AM  Follow-up for Phone Call        #21 , 1 three times a day; do not take with alcohol . Sinus CT & OV if no better. Follow-up by: Marga Melnick MD,  March 11, 2010 3:39 PM  Additional Follow-up for Phone Call Additional follow up Details #1::        Pt aware, Rx sent to pharmacy, awaiting appt info.........Marland KitchenFelecia Deloach CMA  March 11, 2010 3:49 PM     New/Updated Medications: METRONIDAZOLE 500 MG TABS (METRONIDAZOLE) 1 three times a day ; avoid alcoho Prescriptions: METRONIDAZOLE 500 MG TABS (METRONIDAZOLE) 1 three times a day ; avoid alcoho  #21 x 0   Entered by:   Jeremy Johann CMA   Authorized by:   Marga Melnick MD   Signed by:   Jeremy Johann CMA on 03/11/2010   Method used:   Faxed to ...       [facility]* (retail)       [address]       [place], [place]  [id number]       Ph: [phone]       Fax: [phone]   RxID:   [id number]

## 2010-04-25 NOTE — Progress Notes (Signed)
Summary: cough med  Phone Note Call from Patient Call back at Home Phone [phone]   Summary of Call: Patient called noting that he is taking the med given, but is not that much better and needs a cough med. (seen on Monday) Please advise. Initial call taken by: Lucious Groves CMA,  January 11, 2010 8:59 AM  Follow-up for Phone Call        Patient notified.  Follow-up by: Lucious Groves CMA,  January 11, 2010 3:50 PM    New/Updated Medications: HYDROMET 5-1.5 MG/5ML SYRP (HYDROCODONE-HOMATROPINE) 1 tsp every 6 hrs as needed Prescriptions: HYDROMET 5-1.5 MG/5ML SYRP (HYDROCODONE-HOMATROPINE) 1 tsp every 6 hrs as needed  #120 cc x 0   Entered and Authorized by:   Marga Melnick MD   Signed by:   Marga Melnick MD on 01/11/2010   Method used:   Printed then faxed to ...       [facility]* (retail)       [address]       [place], [place]  [id number]       Ph: [phone]       Fax: [phone]   RxID:   [id number]

## 2010-08-06 NOTE — Assessment & Plan Note (Signed)
[facility]                            CARDIOLOGY OFFICE NOTE   NAME:Rookstool, LEONCE BALE                      MRN:          [id number]  DATE:03/12/2007                            DOB:          Mar 04, 1951    SUBJECTIVE:  Mr. Amory is a pleasant gentleman that was recently  admitted to [facility] for surgery on his nose.  Postoperatively he was found to apparently be bradycardic with a brief  episode of asystole.  It was unclear what caused this but it was felt to  possibly be secondary to anesthesia or a vagal episode.  He was admitted  to telemetry and observed.  He had no further arrhythmia's and he did  rule out for myocardial infarction.  We did schedule him to have an  echocardiogram which was performed on February 10, 2007.  His left  ventricular function was normal.  There was mild mitral regurgitation  and mild left atrial enlargement was noted.  He was discharged and had  an outpatient Holter monitor.  This revealed a sinus rhythm with brief  episodes of paroxysmal atrial tachycardia and a PVC but there were no  pauses or symptoms reported.  Finally, a Myoview was performed on  March 08, 2007.  The ejection fraction was found to be 62% and there  was no ischemia or infarction.  Since discharge the patient has not had  dyspnea, chest pain, palpitations or syncope.   MEDICATIONS:  His medications include:  1. Labetalol 300 mg p.o. b.i.d.  2. Micardis 80 mg p.o. daily.  3. Hydrochlorothiazide 12.5 mg p.o. daily.   PHYSICAL EXAMINATION:  VITAL SIGNS:  Physical exam shows a blood  pressure of 148/91 and his pulse is 69.  He weighs 222 pounds.  HEENT: Normal.  NECK:  Supple.  CHEST:  Clear.  CARDIOVASCULAR:  Exam reveals a regular rate and rhythm.  ABDOMEN:  Exam is benign.  EXTREMITIES:  Show no edema.   DIAGNOSES:  1. Recent episode of bradycardia following surgery - this may have      been a vagal episode.  Regardless, his left  ventricular function is      normal.  There is no ischemia on his Myoview and his Holter showed      no further problems with bradycardia.  He has also not had a      syncopal episode.  I do not think we need to proceed with further      cardiac evaluation at this point.  2. Hypertension.  His blood pressure is mildly elevated today.  Dr.      Alwyn Ren recently added the Micardis and he is scheduled to see him      back on March 26, 2007 for additional adjustments.  We will leave      this to his primary care physician.  3. Hyperlipidemia - per his primary care physician.  4. Sleep apnea.  He will continue on his CPAP.   FOLLOWUP:  We will see him back on an as-needed basis.     Madolyn Frieze Jens Som, MD, [facility]  Electronically  Signed    BSC/MedQ  DD: 03/12/2007  DT: 03/13/2007  Job #: [id number]   cc:   Titus Dubin. Alwyn Ren, MD,FACP,FCCP

## 2010-08-06 NOTE — Consult Note (Signed)
NAME:  Jonathon Armstrong, MINIX NO.:  [id number]   MEDICAL RECORD NO.:  [id number]          PATIENT TYPE:  OIB   LOCATION:  3312                         FACILITY:  [facility]   PHYSICIAN:  Madolyn Frieze. Jens Som, MD, FACCDATE OF BIRTH:  [birth year]   DATE OF CONSULTATION:  02/09/2007  DATE OF DISCHARGE:                                 CONSULTATION   PRIMARY CARE PHYSICIAN:  Dr. Alwyn Ren.   CHIEF COMPLAINT:  Asystole.   HISTORY OF PRESENT ILLNESS:  Mr. Clingan is a 60 year old male with no  previous history of coronary artery disease.  He was scheduled for  outpatient surgery today to remove a nasal polyp and evaluate his  tympanic membranes.  This morning he took his morning medications which  include labetalol 300 mg.  He tolerated the surgery well, but during  recovery from anesthesia while still in the operating room, he had a  heart rate then dropped into the 30s and was treated with ephedrine and  then went asystolic.  The asystole lasted 6 seconds by report.  CPR was  begun, and sinus bradycardia was reestablished with atropine 0.4 mg.  He  had another episode of asystole, according to reports, and received 0.2  mg of atropine.  His heart rate increased to the 80s, and his blood  pressure responded as well.  His systolic blood pressure had dropped to  the 80s during this.  There are no strips available for review.  Postprocedure once he recovered from anesthesia, Mr. Leveque is alert and  oriented.   Mr. Weakland never has chest pain.  The labetalol 300 mg b.i.d. he has been  on for over a year without any dose changes.  He denies any history of  chest pain.  He does not have shortness of breath or dyspnea on  exertion.  He does not have syncope or presyncope.  He denies orthopnea,  PND, orthostatic dizziness or weakness.  He has been diagnosed with  obstructive sleep apnea but has not used to CPAP recently and snores  heavily and, according to his wife, has periods of apnea.  At  this time  he is resting comfortably.   PAST MEDICAL HISTORY:  1. Hypertension.  2. Hyperlipidemia.  3. Family history of coronary artery disease but not prematurely.  4. Elevated homocysteine level.  5. History of obstructive sleep apnea.  6. History of sinusitis.   SURGICAL HISTORY:  He is status post sinus surgery map remotely and  today as well as tonsillectomy, left ear surgery, appendectomy, and  hernia repair x2.   ALLERGIES:  He is reportedly allergic or intolerant to PENICILLIN and  TIAZAC.   CURRENT MEDICATIONS:  1. Trandate 300 mg b.i.d.  2. Cozaar 100 mg a day.  3. Vytorin 10/20 daily.   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  He has no history of alcohol, tobacco or drug abuse.   FAMILY HISTORY:  His parents are both alive in their 25s, and his father  has a history of coronary artery disease.   REVIEW OF SYSTEMS:  He has been under family stressing and gained about  15 pounds in the last year.  He wears glasses.  He denies hematemesis,  hemoptysis or melena.  He has no reflux symptoms.  No dysuria.  No  hematuria. He denies arthralgias.  Full 14-point Review of Systems is  otherwise negative.   PHYSICAL EXAMINATION:  VITAL SIGNS:  Temperature is 97.0, blood pressure  181/95, pulse 73, respiratory rate 20, O2 saturation 96% on room air.  GENERAL:  He is a well-developed obese white male in no acute distress.  HEENT:  Is normal.  NECK:  There is no lymphadenopathy, thyromegaly, bruit or JVD noted.  CARDIOVASCULAR:  Heart is regular rate and rhythm with an S1-S2, and no  significant murmur or gallop is noted.  LUNGS:  Essentially clear to auscultation bilaterally.  SKIN:  No rashes or lesions are noted.  ABDOMEN:  Soft and nontender with active bowel sounds.  There is no  hepatosplenomegaly noted by percussion or palpation.  EXTREMITIES:  There is no cyanosis, clubbing or edema noted.  Distal  pulses are intact to all  four extremities.  MUSCULOSKELETAL:  There is no joint deformity or effusions and no spine  or CVA tenderness.  NEUROLOGIC: He is alert and oriented.  Cranial nerves II-XII grossly  intact.   Chest x-ray performed November 14 showed mild cardiomegaly, decreased  lung volumes and no acute disease.   EKG performed today postoperatively shows sinus rhythm, rate 82, with no  acute ischemic changes and diffuse T-wave flattening.   Laboratory values performed November 14 show hemoglobin 14.1, hematocrit  40. Sodium 141, potassium 3.9, glucose 120.  Coags within normal limits  and other labs within normal limits as well.   IMPRESSION:  Mr. Vignola is a 60 year old male with a past medical history  of hypertension, hyperlipidemia, sleep apnea who was postoperative for a  right polypectomy and had an episode of bradycardia and asystole.  He  has no prior cardiac history and no history of syncope, chest pain, or  dizziness.  Today after surgery, he had transit bradycardia and then a 6-  second pause per the notes, although no strips were available for  review.  He was treated with atropine and ephedrine and brief CPR.  Mr.  Mczeal has no recollection of these events and no complaints of chest  pain for dizziness or shortness of breath at this time.  Cardiology was  asked to evaluate him.  His EKG is sinus rhythm with nonspecific ST  changes.  His potassium is 3.9 on November 14.  The etiology of events  is unclear but most likely is a vagal reaction as he had previously  received Zofran per our records.  We will monitor overnight and cycle  cardiac enzymes.  An echocardiogram will be ordered.  If the cardiac  enzymes are negative and he has no further bradycardia on his home dose  of labetalol, he can be discharged in the morning with outpatient 48-  hour  Holter and Myoview.  He will be continued on his home blood pressure  medications with p.r.n. hydralazine for systolic greater than 170.   He  will be restarted on his CPAP for obstructive sleep apnea as well.  We  will check a TSH and lipid profile for screening and a BUN and magnesium  at this time.      Theodore Demark, PA-C      Madolyn Frieze. Jens Som, MD, [facility]  Electronically Signed    RB/MEDQ  D:  02/09/2007  T:  02/10/2007  Job:  [id number]   cc:   Dr. Alwyn Ren

## 2010-09-02 ENCOUNTER — Other Ambulatory Visit: Payer: Self-pay | Admitting: Internal Medicine

## 2010-09-05 ENCOUNTER — Other Ambulatory Visit: Payer: Self-pay | Admitting: Internal Medicine

## 2010-09-17 ENCOUNTER — Other Ambulatory Visit: Payer: Self-pay | Admitting: Internal Medicine

## 2010-09-17 NOTE — Telephone Encounter (Signed)
Pt is already scheduled for CPX. Refill sent.

## 2010-10-06 ENCOUNTER — Other Ambulatory Visit: Payer: Self-pay | Admitting: Internal Medicine

## 2010-10-29 ENCOUNTER — Encounter: Payer: Self-pay | Admitting: Internal Medicine

## 2010-12-05 ENCOUNTER — Encounter: Payer: Self-pay | Admitting: Internal Medicine

## 2010-12-05 ENCOUNTER — Ambulatory Visit (INDEPENDENT_AMBULATORY_CARE_PROVIDER_SITE_OTHER): Payer: 59 | Admitting: Internal Medicine

## 2010-12-05 DIAGNOSIS — I10 Essential (primary) hypertension: Secondary | ICD-10-CM

## 2010-12-05 DIAGNOSIS — R7309 Other abnormal glucose: Secondary | ICD-10-CM

## 2010-12-05 DIAGNOSIS — E785 Hyperlipidemia, unspecified: Secondary | ICD-10-CM | POA: Insufficient documentation

## 2010-12-05 DIAGNOSIS — G4733 Obstructive sleep apnea (adult) (pediatric): Secondary | ICD-10-CM

## 2010-12-05 DIAGNOSIS — I469 Cardiac arrest, cause unspecified: Secondary | ICD-10-CM

## 2010-12-05 DIAGNOSIS — E1169 Type 2 diabetes mellitus with other specified complication: Secondary | ICD-10-CM | POA: Insufficient documentation

## 2010-12-05 DIAGNOSIS — Z0000 Encounter for general adult medical examination without abnormal findings: Secondary | ICD-10-CM

## 2010-12-05 NOTE — Progress Notes (Signed)
Subjective:    Patient ID: Jonathon Armstrong, male    DOB: [birth year], 60 y.o.   MRN: [id number]  HPI  Jonathon Armstrong  is here for a physical; he has no acute issues.      Review of Systems HYPERTENSION: Disease Monitoring: Blood pressure range-< 125/85  Chest pain, palpitations- no       Dyspnea- no Medications: Compliance- yes  Lightheadedness,Syncope- none since sinus surgery    Edema- no  Fasting Hyperglycemia: Disease Monitoring: Blood Sugar ranges-not monitored  Polyuria/phagia/dipsia- no       Visual problems- no Medications: Compliance- no meds  Hypoglycemic symptoms- no  HYPERLIPIDEMIA: Disease Monitoring: See symptoms for Hypertension Medications: Compliance- yes  Abd pain, bowel changes- no   Muscle aches- no          Objective:   Physical Exam Gen.: Healthy and well-nourished in appearance. Alert, appropriate and cooperative throughout exam. Head: Normocephalic without obvious abnormalities;  no alopecia  Eyes: No corneal or conjunctival inflammation noted. Pupils equal round reactive to light and accommodation. Fundal exam is benign without hemorrhages, exudate, papilledema. Extraocular motion intact. Vision grossly normal with lenses. Ears: External  ear exam reveals no significant lesions or deformities. Canals clear .TMs normal. Hearing is grossly slightly decreased on L Nose: External nasal exam reveals no deformity or inflammation. Nasal mucosa are pink and moist. No lesions or exudates noted. Septum  : slight deviation & dislocation  Mouth: Oral mucosa and oropharynx reveal no lesions or exudates. Teeth in good repair. Neck: No deformities, masses, or tenderness noted. Range of motion &. Thyroid normal. Lungs: Normal respiratory effort; chest expands symmetrically. Lungs are clear to auscultation without rales, wheezes, or increased work of breathing. Heart: Normal rate and rhythm. Normal S1 and S2. No gallop, click, or rub. S4 w/o  murmur. Abdomen: Bowel  sounds normal; abdomen soft and nontender. No masses, organomegaly or hernias noted. Genitalia/DRE: Normal exam; no prostate enlargement, induration or nodularity                                                                               Musculoskeletal/extremities: No deformity or scoliosis noted of  the thoracic or lumbar spine. No clubbing, cyanosis, edema, or deformity noted. Range of motion  normal .Tone & strength  normal.Joints : localized DIP changes. Nail health  good. Vascular: Carotid, radial artery, dorsalis pedis and  posterior tibial pulses are full and equal. No bruits present. Neurologic: Alert and oriented x3. Deep tendon reflexes symmetrical and normal.         Skin: Intact without suspicious lesions or rashes. Lymph: No cervical, axillary, or inguinal lymphadenopathy present. Psych: Mood and affect are normal. Normally interactive                                                                                         Assessment &  Plan:  #1 comprehensive physical exam; no acute findings #2 see Problem List with Assessments & Recommendations Plan: see Orders   EKG reveals one PAC. Compared to 08/13/2009 there's been improvement in the T. voltage diffusely, especially in lead 1 and V4 through V6.

## 2010-12-05 NOTE — Patient Instructions (Signed)
Preventive Health Care: Exercise at least 30-45 minutes a day,  3-4 days a week.  Eat a low-fat diet with lots of fruits and vegetables, up to 7-9 servings per day. Consume less than 40 grams of sugar per day from foods & drinks with High Fructose Corn Sugar as # 1,2,3 or # 4 on label. Please  schedule fasting Labs : BMET,Lipids, hepatic panel, CBC & dif, TSH, PSA. (V70.0)

## 2010-12-09 ENCOUNTER — Other Ambulatory Visit: Payer: Self-pay | Admitting: Internal Medicine

## 2010-12-09 DIAGNOSIS — Z0000 Encounter for general adult medical examination without abnormal findings: Secondary | ICD-10-CM

## 2010-12-10 ENCOUNTER — Other Ambulatory Visit (INDEPENDENT_AMBULATORY_CARE_PROVIDER_SITE_OTHER): Payer: 59

## 2010-12-10 DIAGNOSIS — Z0000 Encounter for general adult medical examination without abnormal findings: Secondary | ICD-10-CM

## 2010-12-10 LAB — BASIC METABOLIC PANEL
CO2: 24 mEq/L (ref 19–32)
Calcium: 9.9 mg/dL (ref 8.4–10.5)
Chloride: 105 mEq/L (ref 96–112)
Glucose, Bld: 137 mg/dL — ABNORMAL HIGH (ref 70–99)
Sodium: 138 mEq/L (ref 135–145)

## 2010-12-10 LAB — LIPID PANEL
HDL: 31.5 mg/dL — ABNORMAL LOW (ref >39.00)
LDL Cholesterol: 59 mg/dL (ref 0–99)
Total CHOL/HDL Ratio: 4

## 2010-12-10 LAB — CBC WITH DIFFERENTIAL/PLATELET
Basophils Relative: 0.5 % (ref 0.0–3.0)
Eosinophils Relative: 1.5 % (ref 0.0–5.0)
HCT: 39.1 % (ref 39.0–52.0)
Hemoglobin: 13 g/dL (ref 13.0–17.0)
Lymphs Abs: 1.6 10*3/uL (ref 0.7–4.0)
MCV: 92.1 fl (ref 78.0–100.0)
Monocytes Absolute: 0.4 10*3/uL (ref 0.1–1.0)
Monocytes Relative: 7.2 % (ref 3.0–12.0)
Neutro Abs: 4.1 10*3/uL (ref 1.4–7.7)
Platelets: 155 10*3/uL (ref 150.0–400.0)
RBC: 4.25 Mil/uL (ref 4.22–5.81)
WBC: 6.2 10*3/uL (ref 4.5–10.5)

## 2010-12-10 LAB — HEPATIC FUNCTION PANEL
AST: 26 U/L (ref 0–37)
Total Bilirubin: 0.4 mg/dL (ref 0.3–1.2)

## 2010-12-12 ENCOUNTER — Other Ambulatory Visit: Payer: Self-pay | Admitting: Internal Medicine

## 2010-12-19 ENCOUNTER — Other Ambulatory Visit: Payer: Self-pay | Admitting: Internal Medicine

## 2010-12-23 ENCOUNTER — Telehealth: Payer: Self-pay | Admitting: Internal Medicine

## 2010-12-23 MED ORDER — ZOSTER VACCINE LIVE 19400 UNT/0.65ML ~~LOC~~ SOLR: 0.6500 mL | Freq: Once | SUBCUTANEOUS | Status: DC

## 2010-12-23 NOTE — Telephone Encounter (Signed)
RX mailed.

## 2010-12-23 NOTE — Telephone Encounter (Signed)
Patient wants rx for shingles mailed to him - he checked with ins & they will pay if it is given at pharmacy

## 2010-12-24 ENCOUNTER — Other Ambulatory Visit (INDEPENDENT_AMBULATORY_CARE_PROVIDER_SITE_OTHER): Payer: 59

## 2010-12-24 DIAGNOSIS — R7309 Other abnormal glucose: Secondary | ICD-10-CM

## 2010-12-24 NOTE — Progress Notes (Signed)
Labs only

## 2010-12-27 LAB — HEMOGLOBIN A1C: Hgb A1c MFr Bld: 7.2 % — ABNORMAL HIGH (ref 4.6–6.5)

## 2010-12-31 LAB — CARDIAC PANEL(CRET KIN+CKTOT+MB+TROPI)
CK, MB: 4.2 — ABNORMAL HIGH
Relative Index: 0.4
Total CK: 1105 — ABNORMAL HIGH
Total CK: 876 — ABNORMAL HIGH
Troponin I: 0.01
Troponin I: 0.02

## 2010-12-31 LAB — BASIC METABOLIC PANEL
BUN: 13
BUN: 14
CO2: 28
Calcium: 8.9
Chloride: 105
Creatinine, Ser: 1.02
Creatinine, Ser: 1.02
GFR calc Af Amer: >60
GFR calc non Af Amer: >60
Glucose, Bld: 120 — ABNORMAL HIGH

## 2010-12-31 LAB — PROTIME-INR: Prothrombin Time: 13.3

## 2010-12-31 LAB — LIPID PANEL: Cholesterol: 145

## 2010-12-31 LAB — CK TOTAL AND CKMB (NOT AT ARMC): Total CK: 912 — ABNORMAL HIGH

## 2010-12-31 LAB — CBC
MCHC: 34.9
MCV: 86.1
Platelets: 187
RBC: 4.69
RDW: 13.2

## 2010-12-31 LAB — TSH: TSH: 1.005

## 2010-12-31 LAB — TROPONIN I: Troponin I: 0.01

## 2011-01-10 ENCOUNTER — Ambulatory Visit (INDEPENDENT_AMBULATORY_CARE_PROVIDER_SITE_OTHER): Payer: 59 | Admitting: Internal Medicine

## 2011-01-10 ENCOUNTER — Encounter: Payer: Self-pay | Admitting: Internal Medicine

## 2011-01-10 VITALS — BP 124/78 | HR 86 | Temp 98.6°F | Wt 231.0 lb

## 2011-01-10 DIAGNOSIS — M5412 Radiculopathy, cervical region: Secondary | ICD-10-CM

## 2011-01-10 DIAGNOSIS — Z23 Encounter for immunization: Secondary | ICD-10-CM

## 2011-01-10 MED ORDER — GABAPENTIN 100 MG PO CAPS: 100.0000 mg | ORAL_CAPSULE | ORAL | Status: DC

## 2011-01-10 NOTE — Progress Notes (Signed)
  Subjective:    Patient ID: Jonathon Armstrong, male    DOB: [birth year], 60 y.o.   MRN: [id number]  HPIExtremity pain Location:L neck &  Shoulder/ trapezius area Onset:10 days ago upon arising Trigger/injury:no Pain quality:dull - throbbing Pain severity:up to 8 Duration:constant  Radiation:no Exacerbating factors:rotation of neck to  R Treatment/response:heat with benefit; massage; Icy Hot Review of systems: Constitutional: no fever, chills, sweats, change in weight  Musculoskeletal:no  muscle cramps or pain; no  joint stiffness, redness, or swelling Skin:no rash, color change Neuro: no weakness; incontinence (stool/urine); numbness and tingling Heme:no lymphadenopathy; abnormal bruising or bleeding        Review of Systems     Objective:   Physical Exam   He is healthy and well-nourished and in no acute distress  He has no lymphadenopathy about the head, neck or axilla.  There is full range of motion of the neck; he describes discomfort with rotation to the right.  Thyroid is normal to palpation without nodularity  Cranial nerve exam is normal.  There is slight accentuation of the lordotic curve of the upper thoracic spine  There is asymmetry of the thoracic musculature; right paraspinous muscles are larger than the left. There is no scapular winging with arm extension. There is no neuro deficit noted in the upper extremity.  Gait and reflexes and strength are normal in the upper extremities  He has  a regular rhythm with a grade 1/2-1 systolic murmur  There is an isolated DIP deformity of the third right digit  Skin is free of any significant lesions or rashes.         Assessment & Plan:  #1 cervical radiculopathy symptoms, question C5-C7  Plan: See orders and recommendations

## 2011-01-10 NOTE — Patient Instructions (Signed)
Order for x-rays entered into  the computer; these will be performed at [address] across from [facility]. No appointment is necessary. 

## 2011-01-13 ENCOUNTER — Ambulatory Visit (INDEPENDENT_AMBULATORY_CARE_PROVIDER_SITE_OTHER)
Admission: RE | Admit: 2011-01-13 | Discharge: 2011-01-13 | Disposition: A | Discharge status: Home / Self Care | Payer: 59 | Source: Ambulatory Visit | Attending: Internal Medicine | Admitting: Internal Medicine

## 2011-01-13 DIAGNOSIS — Z23 Encounter for immunization: Secondary | ICD-10-CM

## 2011-01-23 ENCOUNTER — Ambulatory Visit (INDEPENDENT_AMBULATORY_CARE_PROVIDER_SITE_OTHER): Payer: 59 | Admitting: Internal Medicine

## 2011-01-23 ENCOUNTER — Encounter: Payer: Self-pay | Admitting: Internal Medicine

## 2011-01-23 VITALS — BP 108/76 | HR 94 | Temp 99.1°F | Resp 16 | Wt 230.4 lb

## 2011-01-23 DIAGNOSIS — J029 Acute pharyngitis, unspecified: Secondary | ICD-10-CM

## 2011-01-23 DIAGNOSIS — J019 Acute sinusitis, unspecified: Secondary | ICD-10-CM

## 2011-01-23 MED ORDER — CLARITHROMYCIN ER 500 MG PO TB24: 1000.0000 mg | ORAL_TABLET | Freq: Every day | ORAL | Status: AC

## 2011-01-23 MED ORDER — MOMETASONE FUROATE 50 MCG/ACT NA SUSP: 2.0000 | NASAL | Status: DC

## 2011-01-23 NOTE — Progress Notes (Signed)
  Subjective:    Patient ID: Jonathon Armstrong, male    DOB: [birth year], 60 y.o.   MRN: [id number]  HPI Respiratory tract infection Onset/symptoms:10/28 as head congestion Exposures (illness/environmental/extrinsic):yes , non family Progression of symptoms:to ST & chest Treatments/response:Neti pot , Aleve Cold & Sinus Present symptoms: Fever/chills/sweats:no Frontal headache:no Facial pain:no Nasal purulence:green X 2 days Sore throat:yes Dental pain:no Lymphadenopathy:no Wheezing/shortness of breath:no Cough/sputum/hemoptysis:no Associated extrinsic/allergic symptoms:itchy eyes/ sneezing:no Past medical history: Seasonal allergies: remotely/asthma:no Smoking history:never           Review of Systems     Objective:   Physical Exam General appearance is of good health and nourishment; no acute distress or increased work of breathing is present.  No  lymphadenopathy about the head, neck, or axilla noted.   Eyes: No conjunctival inflammation or lid edema is present.   Ears:  External ear exam shows no significant lesions or deformities.  Otoscopic examination reveals clear canals, tympanic membranes are intact bilaterally without bulging, retraction, inflammation or discharge.  Nose:  External nasal examination shows no deformity or inflammation. Nasal mucosa are pink and moist without lesions or exudates. No septal  dislocation.No obstruction to airflow.   Oral exam: Dental hygiene is good; lips and gums are healthy appearing.There is mild  oropharyngeal erythema w/o  exudate noted. Hoarse    Heart:  Normal rate and regular rhythm. S1 and S2 normal without gallop, murmur, click, rub or other extra sounds.   Lungs:Chest clear to auscultation; no wheezes, rhonchi,rales ,or rubs present.No increased work of breathing.    Extremities:  No cyanosis, edema, or clubbing  noted    Skin: Warm & dry           Assessment & Plan:  #1 rhinosinusitis  #2 pharyngitis  #3  past history of rash with amoxicillin in the context of mononucleosis  Plan: See orders and recommendations

## 2011-01-23 NOTE — Patient Instructions (Addendum)
Plain Mucinex for thick secretions ;force NON dairy fluids for next 48 hrs. Use a Neti pot daily as needed for sinus congestion . Nasonex 1 spray in each nostril twice a day as needed. Use the "crossover" technique as discussed   

## 2011-01-23 NOTE — Progress Notes (Signed)
Addended by: Regis Bill on: 01/23/2011 02:04 PM   Modules accepted: Orders

## 2011-03-21 ENCOUNTER — Other Ambulatory Visit: Payer: Self-pay | Admitting: Internal Medicine

## 2011-03-21 MED ORDER — TELMISARTAN 80 MG PO TABS: ORAL_TABLET | ORAL | Status: DC

## 2011-03-21 MED ORDER — EZETIMIBE-SIMVASTATIN 10-20 MG PO TABS: ORAL_TABLET | ORAL | Status: DC

## 2011-03-21 NOTE — Telephone Encounter (Signed)
RXs sent.

## 2011-03-25 DIAGNOSIS — K1121 Acute sialoadenitis: Secondary | ICD-10-CM

## 2011-03-25 HISTORY — DX: Acute sialoadenitis: K11.21

## 2011-03-27 ENCOUNTER — Other Ambulatory Visit: Payer: Self-pay | Admitting: Internal Medicine

## 2011-03-28 NOTE — Telephone Encounter (Signed)
BMET,Lipids, A1c in 3 months after nutrition changes (277.7 Copied from 11/2010

## 2011-10-03 ENCOUNTER — Ambulatory Visit (INDEPENDENT_AMBULATORY_CARE_PROVIDER_SITE_OTHER): Payer: 59 | Admitting: Internal Medicine

## 2011-10-03 ENCOUNTER — Encounter: Payer: Self-pay | Admitting: Internal Medicine

## 2011-10-03 VITALS — BP 128/80 | HR 60 | Temp 98.3°F | Wt 238.0 lb

## 2011-10-03 DIAGNOSIS — K1120 Sialoadenitis, unspecified: Secondary | ICD-10-CM

## 2011-10-03 MED ORDER — CLINDAMYCIN HCL 300 MG PO CAPS: 300.0000 mg | ORAL_CAPSULE | Freq: Four times a day (QID) | ORAL | Status: AC

## 2011-10-03 MED ORDER — CIPROFLOXACIN HCL 500 MG PO TABS: 500.0000 mg | ORAL_TABLET | Freq: Two times a day (BID) | ORAL | Status: AC

## 2011-10-03 NOTE — Assessment & Plan Note (Signed)
Symptoms consistent with parotitis, he is allergic to penicillin. Plan: clinda, ciprofloxacin, see instructions.

## 2011-10-03 NOTE — Patient Instructions (Addendum)
Parotitis  Parotitis is an inflammation of one or both parotid glands. This is the main salivary gland. It lies behind the angle of the jaw and below the ear lobe. The saliva produced comes out of a tiny opening (duct) inside the cheek on either side. It is usually at the level of the upper back teeth. If the parotid gland is swollen, the ear is pushed up and out in a particular way. This helps set this condition apart from a simple lymph gland infection in the same area.  CAUSES   Cases of mumps have mostly disappeared since the start of immunization against mumps. Currently, the most common causes of parotitis are:   Germ (bacterial) infection.   Inflammation of the lymph channels (lymphatics).  Other Uncommon Causes of Parotitis:   Sjogren's syndrome. A condition in which arthritis is associated with a decrease in activity of the glands of the body that produce saliva and tears. Some people are bothered by a dry mouth and intermittent salivary gland enlargement. The diagnosis is made with blood tests or by examination of a piece of tissue from the inside of the lip.   Atypical mycobacteria. Can give rise to a condition that usually infects children. It is a germ similar to tuberculosis. It is often resistant to antibiotic treatment. It may require surgical treatment and removal of the infected salivary gland.   Actinomycosis. An infection of the parotid gland that may also involve the overlying skin. The diagnosis is made by detecting granules of sulphur, produced by the bacteria, on microscopic examination. Treatment is with a prolonged course of penicillin for up to one year.  Acute (Sudden Onset) Bacertial Parotitis  This is a sudden inflammatory response to bacterial infection that causes:   Redness (erythema).   Pain.   Swelling.   Tenderness over the gland on the side of the cheek.   The appearance of pus from the opening of the duct on the inside of the cheek.  It used to be common in dehydrated  and debilitated patients, and often following surgery. It is now more commonly seen after radiotherapy (X-ray treatment) or in patients with a poorly working immune system. Treatment includes:    Correction of the lack of fluids (rehydration).   Medications which kill germs(antibiotics).   Pain relief.  Chronic Recurrent Parotitis  This refers to repeated episodes of discomfort and swelling of the parotid gland. This occurs often after eating. It is caused by decreased flow of saliva. This is often due to either blockage of the duct by a stone or the formation of a duct narrowing. It is treated with:    Gland massage.   Methods to stimulate the flow of saliva (for example, giving lemon juice).   Antibiotics if required.  Surgery to remove the gland is possible. The benefits of surgery need to be balanced against the risk of damage to the facial nerve. The facial nerve allows the muscles of facial expression to function. Damage to this can cause paralysis of one side of the face. X-ray treatment (radiotherapy) and treatment with steroid tablets have been considered. But they are thought to be ineffective.  Viral Parotitis  The most common viral cause of parotitis is mumps. It usually affects 4 to 10 year olds. It causes painful swelling of both parotid glands.  Recurrent Parotitis in Children  This condition is thought to be due to swelling or ballooning of the ducts (ectasia). It results in the same problems(symptoms ) as acute   penicillin. It usually gets well by itself without treatment (self-limiting). Surgery is usually not needed. Tuberculosis The parotid glands may become infected with the same bacteria causing tuberculosis (TB). Treatment is with anti-tuberculous antibiotic therapy. HOME CARE INSTRUCTIONS   Apply ice bags about every 2 hours, for 15 to 20 minutes, while awake, to the sore  gland. Place ice in a plastic bag with a towel around it to prevent frostbite to skin. Continue for 24 hours and then as directed by your caregiver.   Only take over-the-counter or prescription medicines for pain, discomfort, or fever as directed by your caregiver.  SEEK IMMEDIATE MEDICAL CARE IF:   There is increased pain or swelling in your gland that is not controlled with medication.   You have a fever.  Document Released: 08/30/2001 Document Revised: 02/27/2011 Document Reviewed  02/03/2011 [facility] Patient Information 2012 [place], [place].  Take antibiotics as prescribed Drink plenty of fluids Suck on a wedge of lime or lemon 2-3 times a day Call if not improving in the next few days, call anytime if you get worse.

## 2011-10-03 NOTE — Progress Notes (Signed)
  Subjective:    Patient ID: Jonathon Armstrong, male    DOB: January 08, 1951, 61 y.o.   MRN: [id number]  HPI Acute visit Last night the right side of his face become tender to touch, this morning it is a little hard to open his mouth wide. Symptoms resemble a previous episode of arthritis he had several years ago.  Past Medical History  Diagnosis Date  . Hyperlipidemia   . Hypertension   . Elevated glucose     fasting hyperglycemia  . Obstructive sleep apnea   . Asystole     "vagal response"post nasal polypectomy  . FH: colonic polyps     hx of  . Diverticulosis of colon   . Mitral regurgitation     mild & aortic Valve calcification on 2d echo;sbe prophylaxis    Review of Systems No fever or chills No sore throat, ear ache, runny nose. No cough or chest congestion No difficulty swallowing.     Objective:   Physical Exam  HENT:  Head:      General -- alert, well-developed . No apparent distress.  Neck --no LADs HEENT -- TMs normal, throat w/o redness,   nose not congested . Right parotid slightly increased in size, slightly tender. No red or hot. Left parotid normal. No trismus or difficulty swallowing. Oral cavity normal to inspection. Lungs -- normal respiratory effort, no intercostal retractions, no accessory muscle use, and normal breath sounds.   Heart-- normal rate, regular rhythm, no murmur, and no gallop.    Psych-- Cognition and judgment appear intact. Alert and cooperative with normal attention span and concentration.  not anxious appearing and not depressed appearing.         Assessment & Plan:

## 2011-10-30 ENCOUNTER — Telehealth: Payer: Self-pay | Admitting: Internal Medicine

## 2011-10-30 MED ORDER — TELMISARTAN 80 MG PO TABS: ORAL_TABLET | ORAL | Status: DC

## 2011-10-30 NOTE — Telephone Encounter (Signed)
Refill: Micardis 80mg  tablet. Take 1 tablet once daily. Qty 30. Last fill 09-29-11

## 2011-10-30 NOTE — Telephone Encounter (Signed)
RX sent

## 2011-12-19 ENCOUNTER — Other Ambulatory Visit: Payer: Self-pay | Admitting: Internal Medicine

## 2012-04-01 ENCOUNTER — Other Ambulatory Visit: Payer: Self-pay | Admitting: Internal Medicine

## 2012-04-01 NOTE — Telephone Encounter (Signed)
APPOINTMENT DUE, Patient needs to schedule CPX and fasting labs

## 2012-04-29 ENCOUNTER — Other Ambulatory Visit: Payer: Self-pay | Admitting: Internal Medicine

## 2012-05-07 ENCOUNTER — Other Ambulatory Visit: Payer: Self-pay | Admitting: Internal Medicine

## 2012-05-08 ENCOUNTER — Ambulatory Visit (INDEPENDENT_AMBULATORY_CARE_PROVIDER_SITE_OTHER): Payer: 59 | Admitting: Family Medicine

## 2012-05-08 VITALS — BP 111/71 | HR 81 | Temp 98.7°F | Resp 18 | Wt 243.0 lb

## 2012-05-08 DIAGNOSIS — J069 Acute upper respiratory infection, unspecified: Secondary | ICD-10-CM

## 2012-05-08 MED ORDER — AZITHROMYCIN 250 MG PO TABS: ORAL_TABLET | ORAL | Status: DC

## 2012-05-08 NOTE — Patient Instructions (Addendum)
Saline nasal spray atleast 4 times per day, over the counter mucinex or mucinex DM, drink plenty of fluids. Can fill zpak if chest congestion and not improving into next week. Return to the clinic or go to the nearest emergency room if any of your symptoms worsen or new symptoms occur.

## 2012-05-08 NOTE — Progress Notes (Signed)
  Subjective:    Patient ID: Jonathon Armstrong, male    DOB: June 05, 1950, 62 y.o.   MRN: [id number]  HPI Jonathon Armstrong is a 62 y.o. male Started with headache, scratchy throat. Hx of bronchitis in past with these sx's - last experienced a year and a half ago.  No fever. Did have flu vaccine.  No known sick contacts.  Nasal congestion today, nothing in chest yet "but always goes to chest"   Tx: mucinex D. Aware of risks with this medicine and blood pressure. advil few times for headache.    Review of Systems As above.     Objective:   Physical Exam  Vitals reviewed. Constitutional: He is oriented to person, place, and time. He appears well-developed and well-nourished.  HENT:  Head: Normocephalic and atraumatic.  Right Ear: Tympanic membrane, external ear and ear canal normal.  Left Ear: Tympanic membrane, external ear and ear canal normal.  Nose: No rhinorrhea. Right sinus exhibits no maxillary sinus tenderness and no frontal sinus tenderness. Left sinus exhibits no maxillary sinus tenderness and no frontal sinus tenderness.  Mouth/Throat: Oropharynx is clear and moist and mucous membranes are normal. No oropharyngeal exudate or posterior oropharyngeal erythema.  Eyes: Conjunctivae are normal. Pupils are equal, round, and reactive to light.  Neck: Neck supple.  Cardiovascular: Normal rate, regular rhythm, normal heart sounds and intact distal pulses.   No murmur heard. Pulmonary/Chest: Effort normal and breath sounds normal. He has no wheezes. He has no rhonchi. He has no rales.  Abdominal: Soft. There is no tenderness.  Lymphadenopathy:    He has no cervical adenopathy.  Neurological: He is alert and oriented to person, place, and time.  Skin: Skin is warm and dry. No rash noted.  Psychiatric: He has a normal mood and affect. His behavior is normal.          Assessment & Plan:  Jonathon Armstrong is a 62 y.o. male Acute upper respiratory infections of unspecified site - Plan:  azithromycin (ZITHROMAX) 250 MG tablet  [place] Peers - discussed likely viral uri at this point.  Hx of bronchitis.  zpak if not improving next week. Sx care with mucinex and saline ns.  Cautioned on mucinex D and PMH including htn. Understanding expressed.   Patient Instructions  Saline nasal spray atleast 4 times per day, over the counter mucinex or mucinex DM, drink plenty of fluids. Can fill zpak if chest congestion and not improving into next week. Return to the clinic or go to the nearest emergency room if any of your symptoms worsen or new symptoms occur.

## 2012-05-31 ENCOUNTER — Other Ambulatory Visit: Payer: Self-pay | Admitting: Internal Medicine

## 2012-06-07 ENCOUNTER — Other Ambulatory Visit: Payer: Self-pay | Admitting: Internal Medicine

## 2012-07-13 ENCOUNTER — Ambulatory Visit (INDEPENDENT_AMBULATORY_CARE_PROVIDER_SITE_OTHER): Payer: 59 | Admitting: Internal Medicine

## 2012-07-13 ENCOUNTER — Encounter: Payer: Self-pay | Admitting: Internal Medicine

## 2012-07-13 VITALS — BP 120/68 | HR 89 | Temp 98.6°F | Resp 14 | Ht 69.5 in | Wt 237.0 lb

## 2012-07-13 DIAGNOSIS — R9431 Abnormal electrocardiogram [ECG] [EKG]: Secondary | ICD-10-CM

## 2012-07-13 DIAGNOSIS — Z0000 Encounter for general adult medical examination without abnormal findings: Secondary | ICD-10-CM

## 2012-07-13 MED ORDER — TELMISARTAN 80 MG PO TABS: ORAL_TABLET | ORAL | Status: DC

## 2012-07-13 MED ORDER — SPIRONOLACTONE 25 MG PO TABS: 25.0000 mg | ORAL_TABLET | Freq: Every day | ORAL | Status: DC

## 2012-07-13 MED ORDER — LABETALOL HCL 300 MG PO TABS: ORAL_TABLET | ORAL | Status: DC

## 2012-07-13 NOTE — Progress Notes (Signed)
  Subjective:    Patient ID: Jonathon Armstrong, male    DOB: Jun 09, 1950, 62 y.o.   MRN: [id number]  HPI  [name] is here for a physical; he denies acute issues.     Review of Systems He is on a heart healthy diet; he exercises 60 minutes 2 times per week as walking without symptoms. Specifically he denies chest pain, palpitations, dyspnea, or claudication. Blood pressure averages in the 120s over 72 or less. He's been compliant with his blood pressure medicine and statin. Family history is negative for premature coronary disease. No advanced cholesterol testing to date. He does not monitor glucoses; he denies polyuria, polydipsia, or polyphagia. A1c has been non-diabetic todate.     . Objective:   Physical Exam  Gen.: Healthy and well-nourished in appearance. Alert, appropriate and cooperative throughout exam. Head: Normocephalic without obvious abnormalities; no alopecia  Eyes: No corneal or conjunctival inflammation noted. Pupils equal round reactive to light and accommodation.  Extraocular motion intact. Vision grossly normal with lenses Ears: External  ear exam reveals no significant lesions or deformities. Canals clear .Minor scarring L TM. Hearing is grossly decreased on L. Nose: External nasal exam reveals no deformity or inflammation. Nasal mucosa are pink and moist. No lesions or exudates noted.  Mouth: Oral mucosa and oropharynx reveal no lesions or exudates. Teeth in good repair. Neck: No deformities, masses, or tenderness noted. Range of motion & Thyroid normal. Lungs: Normal respiratory effort; chest expands symmetrically. Lungs are clear to auscultation without rales, wheezes, or increased work of breathing. Heart: Normal rate and rhythm. Normal S1 and S2. No gallop, click, or rub. S4 w/o murmur. Abdomen: Bowel sounds normal; abdomen soft and nontender. No masses or organomegaly .Small umbilical hernia noted. Genitalia: Genitalia normal except for left varices & slight atrophy L  testicle. Prostate is normal without enlargement, asymmetry, nodularity, or induration.                             Musculoskeletal/extremities: There is subtle asymmetry of the posterior thoracic musculature suggesting occult scoliosis. No clubbing, cyanosis, edema, or significant extremity  deformity noted. Range of motion normal .Tone & strength  Normal. Joints  reveal mild  DJD DIP changes. Nail health good. Able to lie down & sit up w/o help. Negative SLR bilaterally Vascular: Carotid, radial artery, dorsalis pedis and  posterior tibial pulses are full and equal. No bruits present. Neurologic: Alert and oriented x3. Deep tendon reflexes symmetrical and normal.       Skin: Intact without suspicious lesions or rashes. Lymph: No cervical, axillary, or inguinal lymphadenopathy present. Psych: Mood and affect are normal. Normally interactive                                                                                        Assessment & Plan:  #1 comprehensive physical exam; no acute findings  Plan: see Orders  & Recommendations

## 2012-07-13 NOTE — Patient Instructions (Addendum)
Preventive Health Care: Exercise at least 30-45 minutes a day,  3-4 days a week.  Eat a low-fat diet with lots of fruits and vegetables, up to 7-9 servings per day. This would eliminate the need for vitamin supplements. Consume less than 40 grams of sugar (preferably ZERO) per day from foods & drinks with High Fructose Corn Sugar as #1,2,3 or # 4 on label. Take the EKG to any emergency room or preop visits. There are nonspecific T  changes; as long as there is no new change these are not clinically significant . If the old EKG is not available for comparison; it may result in unnecessary hospitalization for observation with significant unnecessary expense. Please  schedule fasting Labs : BMET,NMR LIPOPROFILE Lipid Panel, hepatic panel, CBC & dif, TSH.PLEASE BRING THESE INSTRUCTIONS TO FOLLOW UP  LAB APPOINTMENT.This will guarantee correct labs are drawn, eliminating need for repeat blood sampling ( needle sticks ! ). Diagnoses /Codes: V70.0.  If you activate the  My Chart system; lab & Xray results will be released directly  to you as soon as I review & address these through the computer. If you choose not to sign up for My Chart within 36 hours of labs being drawn; results will be reviewed & interpretation added before being copied & mailed, causing a delay in getting the results to you.If you do not receive that report within 7-10 days ,please call. Additionally you can use this system to gain direct  access to your records  if  out of town or @ an office of a  physician who is not in  the My Chart network.  This improves continuity of care & places you in control of your medical record.

## 2012-07-27 ENCOUNTER — Other Ambulatory Visit (INDEPENDENT_AMBULATORY_CARE_PROVIDER_SITE_OTHER): Payer: 59

## 2012-07-27 DIAGNOSIS — Z0000 Encounter for general adult medical examination without abnormal findings: Secondary | ICD-10-CM

## 2012-07-27 LAB — HEPATIC FUNCTION PANEL
Albumin: 4.3 g/dL (ref 3.5–5.2)
Alkaline Phosphatase: 65 U/L (ref 39–117)
Bilirubin, Direct: 0.1 mg/dL (ref 0.0–0.3)
Total Bilirubin: 0.7 mg/dL (ref 0.3–1.2)

## 2012-07-27 LAB — CBC WITH DIFFERENTIAL/PLATELET
Basophils Absolute: 0 10*3/uL (ref 0.0–0.1)
HCT: 40 % (ref 39.0–52.0)
Hemoglobin: 14 g/dL (ref 13.0–17.0)
Lymphs Abs: 1.5 10*3/uL (ref 0.7–4.0)
MCHC: 34.9 g/dL (ref 30.0–36.0)
Monocytes Relative: 7.4 % (ref 3.0–12.0)
Neutro Abs: 4.3 10*3/uL (ref 1.4–7.7)
RDW: 13.2 % (ref 11.5–14.6)

## 2012-07-27 LAB — BASIC METABOLIC PANEL
BUN: 21 mg/dL (ref 6–23)
CO2: 24 mEq/L (ref 19–32)
Calcium: 9.7 mg/dL (ref 8.4–10.5)
Glucose, Bld: 155 mg/dL — ABNORMAL HIGH (ref 70–99)
Sodium: 135 mEq/L (ref 135–145)

## 2012-07-29 LAB — NMR LIPOPROFILE WITH LIPIDS
HDL Particle Number: 28 umol/L — ABNORMAL LOW (ref >=30.5)
HDL-C: 34 mg/dL — ABNORMAL LOW (ref >=40)
LDL Size: 19.7 nm — ABNORMAL LOW (ref >20.5)
Large HDL-P: <1.3 umol/L — ABNORMAL LOW (ref >=4.8)
Large VLDL-P: 15.1 nmol/L — ABNORMAL HIGH (ref <=2.7)

## 2012-08-02 ENCOUNTER — Ambulatory Visit: Payer: 59

## 2012-08-02 DIAGNOSIS — R7309 Other abnormal glucose: Secondary | ICD-10-CM

## 2012-08-04 ENCOUNTER — Encounter: Payer: Self-pay | Admitting: Internal Medicine

## 2012-08-11 ENCOUNTER — Other Ambulatory Visit: Payer: Self-pay | Admitting: Internal Medicine

## 2012-12-30 ENCOUNTER — Other Ambulatory Visit: Payer: Self-pay | Admitting: Internal Medicine

## 2012-12-31 ENCOUNTER — Other Ambulatory Visit: Payer: Self-pay | Admitting: *Deleted

## 2012-12-31 MED ORDER — EZETIMIBE-SIMVASTATIN 10-20 MG PO TABS: ORAL_TABLET | ORAL | Status: DC

## 2012-12-31 NOTE — Telephone Encounter (Signed)
Vytorin refill sent to pharmacy

## 2013-02-25 ENCOUNTER — Ambulatory Visit (INDEPENDENT_AMBULATORY_CARE_PROVIDER_SITE_OTHER): Payer: 59 | Admitting: Internal Medicine

## 2013-02-25 ENCOUNTER — Encounter: Payer: Self-pay | Admitting: Internal Medicine

## 2013-02-25 VITALS — BP 133/80 | HR 66 | Temp 98.3°F | Resp 13 | Ht 69.5 in | Wt 241.6 lb

## 2013-02-25 DIAGNOSIS — J209 Acute bronchitis, unspecified: Secondary | ICD-10-CM

## 2013-02-25 DIAGNOSIS — E1169 Type 2 diabetes mellitus with other specified complication: Secondary | ICD-10-CM | POA: Insufficient documentation

## 2013-02-25 DIAGNOSIS — IMO0001 Reserved for inherently not codable concepts without codable children: Secondary | ICD-10-CM

## 2013-02-25 DIAGNOSIS — E119 Type 2 diabetes mellitus without complications: Secondary | ICD-10-CM | POA: Insufficient documentation

## 2013-02-25 LAB — MICROALBUMIN / CREATININE URINE RATIO
Creatinine,U: 125.3 mg/dL
Microalb Creat Ratio: 1.3 mg/g (ref 0.0–30.0)
Microalb, Ur: 1.6 mg/dL (ref 0.0–1.9)

## 2013-02-25 MED ORDER — HYDROCODONE-HOMATROPINE 5-1.5 MG/5ML PO SYRP: 5.0000 mL | ORAL_SOLUTION | Freq: Four times a day (QID) | ORAL | Status: DC | PRN

## 2013-02-25 MED ORDER — AZITHROMYCIN 250 MG PO TABS: ORAL_TABLET | ORAL | Status: DC

## 2013-02-25 NOTE — Patient Instructions (Signed)

## 2013-02-25 NOTE — Progress Notes (Signed)
   Subjective:    Patient ID: Jonathon Armstrong, male    DOB: [birth year], 62 y.o.   MRN: [id number]  HPI   Symptoms began 7-8 days ago as a scratchy throat. This was followed by a dry cough. Subsequently as of 12/1 he is developed discolored nasal secretions, postnasal drainage, watery eyes.  With laughing he will developed some airway compromise. There's been no definite wheezing or shortness of breath.  He has used the nasal saline with some response. The cough continues to be major issue.  He does have a  past history of bronchitis but not asthma. He's never smoked.    Review of Systems  He denies associated itchy eyes or sneezing. He has not had fever, chills, or sweats.  The cough is not associated with purulent sputum. He has no frontal sinus pain, maxillary sinus pain, dental pain, otic pain, or otic discharge.   His A1c was 7.1% in May. He does not monitor glucoses at this time. Due to stresses his diet program has not been optimal     Objective:   Physical Exam General appearance:good health ;well nourished; no acute distress or increased work of breathing is present.  No  lymphadenopathy about the head, neck, or axilla noted.   Eyes: No conjunctival inflammation or lid edema is present.   Ears:  External ear exam shows no significant lesions or deformities.  Otoscopic examination reveals clear canals, tympanic membranes are intact bilaterally without bulging, retraction, inflammation or discharge. Healed perforation right TM. Postoperative changes left TM  Nose:  External nasal examination shows no deformity or inflammation. Nasal mucosa are pink and moist without lesions or exudates. No septal dislocation or deviation.No obstruction to airflow.   Oral exam: Dental hygiene is good; lips and gums are healthy appearing.There is no oropharyngeal erythema or exudate noted.   Neck:  No deformities, thyromegaly, masses, or tenderness noted.    Heart:  Normal rate and regular  rhythm. S1 and S2 normal without gallop, murmur, click, rub or other extra sounds.   Lungs:Chest clear to auscultation; no wheezes, rhonchi,rales ,or rubs present.No increased work of breathing.    Extremities:  No cyanosis, edema, or clubbing  noted    Skin: Warm & dry .         Assessment & Plan:  #1 acute bronchitis w/o bronchospasm #2 URI, resolved  #3 diabetes, questionable status Plan: See orders and recommendations

## 2013-02-25 NOTE — Progress Notes (Signed)
Pre visit review using our clinic review tool, if applicable. No additional management support is needed unless otherwise documented below in the visit note. 

## 2013-02-26 ENCOUNTER — Other Ambulatory Visit: Payer: Self-pay | Admitting: Internal Medicine

## 2013-02-26 MED ORDER — METFORMIN HCL 1000 MG PO TABS: 500.0000 mg | ORAL_TABLET | Freq: Two times a day (BID) | ORAL | Status: DC

## 2013-02-26 MED ORDER — DAPAGLIFLOZIN PROPANEDIOL 10 MG PO TABS: 10.0000 mg | ORAL_TABLET | ORAL | Status: DC

## 2013-04-21 ENCOUNTER — Telehealth: Payer: Self-pay | Admitting: *Deleted

## 2013-04-21 NOTE — Telephone Encounter (Signed)
Called and informed patient's wife that card is ready for pick up

## 2013-04-21 NOTE — Telephone Encounter (Signed)
Patient called and stated that his discount card for ezetimibe-simvastatin (VYTORIN) 10-20 MG per tablet is expired and wanted to know if we can give him another one

## 2013-07-16 ENCOUNTER — Other Ambulatory Visit: Payer: Self-pay | Admitting: Internal Medicine

## 2013-08-08 ENCOUNTER — Other Ambulatory Visit: Payer: Self-pay | Admitting: Internal Medicine

## 2013-08-17 ENCOUNTER — Other Ambulatory Visit: Payer: Self-pay | Admitting: Internal Medicine

## 2013-09-01 ENCOUNTER — Ambulatory Visit (INDEPENDENT_AMBULATORY_CARE_PROVIDER_SITE_OTHER): Payer: 59 | Admitting: Internal Medicine

## 2013-09-01 ENCOUNTER — Encounter: Payer: Self-pay | Admitting: Internal Medicine

## 2013-09-01 VITALS — BP 112/74 | HR 72 | Temp 98.7°F | Wt 238.0 lb

## 2013-09-01 DIAGNOSIS — E1165 Type 2 diabetes mellitus with hyperglycemia: Secondary | ICD-10-CM

## 2013-09-01 DIAGNOSIS — J209 Acute bronchitis, unspecified: Secondary | ICD-10-CM

## 2013-09-01 DIAGNOSIS — I10 Essential (primary) hypertension: Secondary | ICD-10-CM

## 2013-09-01 DIAGNOSIS — E785 Hyperlipidemia, unspecified: Secondary | ICD-10-CM

## 2013-09-01 DIAGNOSIS — IMO0001 Reserved for inherently not codable concepts without codable children: Secondary | ICD-10-CM

## 2013-09-01 MED ORDER — AZITHROMYCIN 250 MG PO TABS: ORAL_TABLET | ORAL | Status: DC

## 2013-09-01 MED ORDER — HYDROCODONE-HOMATROPINE 5-1.5 MG/5ML PO SYRP: 5.0000 mL | ORAL_SOLUTION | Freq: Four times a day (QID) | ORAL | Status: DC | PRN

## 2013-09-01 NOTE — Patient Instructions (Signed)

## 2013-09-01 NOTE — Progress Notes (Signed)
   Subjective:    Patient ID: Jonathon Armstrong, male    DOB: Nov 17, 1950, 63 y.o.   MRN: [id number]  HPI  His symptoms began 08/18/13 as a sore, scratchy throat followed by rhinitis. By day 3 he had chest congestion with a nonproductive cough. He also has had itchy, watery eyes and sneezing.  The initiating event was probably exposure to an ill granddaughter  He's used Mucinex D and Robitussin with some benefit  He's had some nasal discoloration. He also questions some wheezing.  His last A1c was 7.5. He is not monitoring his glucoses    Review of Systems  He denies frontal headache, facial pain, dental pain, otic pain, or otic discharge.  There is no purulent sputum. He has no associated shortness of breath  He also denies fever, chills, or sweats     Objective:   Physical Exam General appearance:good health ;well nourished; no acute distress or increased work of breathing is present.  No  lymphadenopathy about the head, neck, or axilla noted. Obesity present as per CDC Guidelines  Eyes: No conjunctival inflammation or lid edema is present. There is no scleral icterus.  Ears:  External ear exam shows no significant lesions or deformities.  Otoscopic examination reveals clear canals, tympanic membranes are intact bilaterally without bulging, retraction, inflammation or discharge.  Nose:  External nasal examination shows no deformity or inflammation. Nasal mucosa are pink and moist without lesions or exudates. No septal dislocation or deviation.No obstruction to airflow.   Oral exam: Dental hygiene is good; lips and gums are healthy appearing.There is no oropharyngeal erythema or exudate noted.   Neck:  No deformities, thyromegaly, masses, or tenderness noted.    Heart:  Normal rate and regular rhythm. S1 and S2 normal without gallop, murmur, click, rub or other extra sounds.   Lungs:Chest clear to auscultation; no wheezes, rhonchi,rales ,or rubs present.No increased work of  breathing.    Extremities:  No cyanosis, edema, or clubbing  noted    Skin: Warm & dry .         Assessment & Plan:  #1 Acute bronchitis  #2 diabetes, questionable control  See orders and recommendations

## 2013-09-01 NOTE — Progress Notes (Signed)
Pre visit review using our clinic review tool, if applicable. No additional management support is needed unless otherwise documented below in the visit note. 

## 2013-09-21 ENCOUNTER — Telehealth: Payer: Self-pay | Admitting: *Deleted

## 2013-09-21 NOTE — Telephone Encounter (Signed)
Spoke with wife and patient will come for labs.  Orders already placed.

## 2013-09-29 ENCOUNTER — Other Ambulatory Visit (INDEPENDENT_AMBULATORY_CARE_PROVIDER_SITE_OTHER): Payer: 59

## 2013-09-29 DIAGNOSIS — E785 Hyperlipidemia, unspecified: Secondary | ICD-10-CM

## 2013-09-29 DIAGNOSIS — IMO0001 Reserved for inherently not codable concepts without codable children: Secondary | ICD-10-CM

## 2013-09-29 DIAGNOSIS — E1165 Type 2 diabetes mellitus with hyperglycemia: Principal | ICD-10-CM

## 2013-09-29 DIAGNOSIS — I10 Essential (primary) hypertension: Secondary | ICD-10-CM

## 2013-09-29 LAB — BASIC METABOLIC PANEL
BUN: 14 mg/dL (ref 6–23)
CO2: 28 mEq/L (ref 19–32)
Calcium: 9.7 mg/dL (ref 8.4–10.5)
Chloride: 106 mEq/L (ref 96–112)
Creatinine, Ser: 1.1 mg/dL (ref 0.4–1.5)
GFR: 73.12 mL/min (ref >60.00)
Glucose, Bld: 178 mg/dL — ABNORMAL HIGH (ref 70–99)
Potassium: 4.5 mEq/L (ref 3.5–5.1)
SODIUM: 138 meq/L (ref 135–145)

## 2013-09-29 LAB — LIPID PANEL
CHOL/HDL RATIO: 4
Cholesterol: 137 mg/dL (ref 0–200)
HDL: 38.6 mg/dL — AB (ref >39.00)
LDL Cholesterol: 51 mg/dL (ref 0–99)
NONHDL: 98.4
Triglycerides: 238 mg/dL — ABNORMAL HIGH (ref 0.0–149.0)
VLDL: 47.6 mg/dL — AB (ref 0.0–40.0)

## 2013-09-29 LAB — HEMOGLOBIN A1C: HEMOGLOBIN A1C: 7.2 % — AB (ref 4.6–6.5)

## 2013-09-29 LAB — HEPATIC FUNCTION PANEL
ALT: 34 U/L (ref 0–53)
AST: 29 U/L (ref 0–37)
Albumin: 4.3 g/dL (ref 3.5–5.2)
Alkaline Phosphatase: 66 U/L (ref 39–117)
BILIRUBIN DIRECT: 0.1 mg/dL (ref 0.0–0.3)
Total Bilirubin: 0.6 mg/dL (ref 0.2–1.2)
Total Protein: 6.9 g/dL (ref 6.0–8.3)

## 2013-09-29 LAB — MICROALBUMIN / CREATININE URINE RATIO
Creatinine,U: 212.1 mg/dL
MICROALB UR: 2.2 mg/dL — AB (ref 0.0–1.9)
Microalb Creat Ratio: 1 mg/g (ref 0.0–30.0)

## 2013-09-29 LAB — TSH: TSH: 1.03 u[IU]/mL (ref 0.35–4.50)

## 2013-10-01 ENCOUNTER — Other Ambulatory Visit: Payer: Self-pay | Admitting: Internal Medicine

## 2013-10-01 DIAGNOSIS — IMO0001 Reserved for inherently not codable concepts without codable children: Secondary | ICD-10-CM

## 2013-10-01 DIAGNOSIS — E1165 Type 2 diabetes mellitus with hyperglycemia: Principal | ICD-10-CM

## 2013-10-01 MED ORDER — METFORMIN HCL 500 MG PO TABS: ORAL_TABLET | ORAL | Status: DC

## 2013-10-03 MED ORDER — METFORMIN HCL 500 MG PO TABS: ORAL_TABLET | ORAL | Status: DC

## 2013-10-03 NOTE — Addendum Note (Signed)
Addended by: Roma Schanz R on: 10/03/2013 08:15 AM   Modules accepted: Orders

## 2013-12-29 ENCOUNTER — Other Ambulatory Visit: Payer: Self-pay | Admitting: Internal Medicine

## 2014-02-10 ENCOUNTER — Other Ambulatory Visit: Payer: Self-pay | Admitting: Internal Medicine

## 2014-02-18 ENCOUNTER — Other Ambulatory Visit: Payer: Self-pay | Admitting: Internal Medicine

## 2014-02-21 ENCOUNTER — Ambulatory Visit (INDEPENDENT_AMBULATORY_CARE_PROVIDER_SITE_OTHER): Payer: 59 | Admitting: Internal Medicine

## 2014-02-21 ENCOUNTER — Other Ambulatory Visit (INDEPENDENT_AMBULATORY_CARE_PROVIDER_SITE_OTHER): Payer: 59

## 2014-02-21 ENCOUNTER — Encounter: Payer: Self-pay | Admitting: Internal Medicine

## 2014-02-21 VITALS — BP 128/70 | HR 78 | Temp 98.9°F | Wt 233.0 lb

## 2014-02-21 DIAGNOSIS — R059 Cough, unspecified: Secondary | ICD-10-CM

## 2014-02-21 DIAGNOSIS — IMO0002 Reserved for concepts with insufficient information to code with codable children: Secondary | ICD-10-CM

## 2014-02-21 DIAGNOSIS — E1165 Type 2 diabetes mellitus with hyperglycemia: Secondary | ICD-10-CM

## 2014-02-21 DIAGNOSIS — J069 Acute upper respiratory infection, unspecified: Secondary | ICD-10-CM

## 2014-02-21 DIAGNOSIS — R05 Cough: Secondary | ICD-10-CM

## 2014-02-21 LAB — HEMOGLOBIN A1C: HEMOGLOBIN A1C: 7.1 % — AB (ref 4.6–6.5)

## 2014-02-21 MED ORDER — SULFAMETHOXAZOLE-TRIMETHOPRIM 800-160 MG PO TABS: 1.0000 | ORAL_TABLET | Freq: Two times a day (BID) | ORAL | Status: DC

## 2014-02-21 MED ORDER — HYDROCODONE-HOMATROPINE 5-1.5 MG/5ML PO SYRP
5.0000 mL | ORAL_SOLUTION | Freq: Four times a day (QID) | ORAL | Status: DC | PRN
Start: 2014-02-21 — End: 2014-05-02

## 2014-02-21 NOTE — Patient Instructions (Signed)

## 2014-02-21 NOTE — Progress Notes (Signed)
Pre visit review using our clinic review tool, if applicable. No additional management support is needed unless otherwise documented below in the visit note. 

## 2014-02-21 NOTE — Progress Notes (Signed)
   Subjective:    Patient ID: Jonathon Armstrong, male    DOB: 10/27/1950, 63 y.o.   MRN: [id number]  HPI   His symptoms began 11/28 as head congestion followed by throat discomfort 11/30 and now nonproductive cough. He was exposed to strep over Thanksgiving.  He describes paroxysmal cough lasting up to a minute which is nonproductive.  He has had some yellow-green nasal discharge.  He took non prescription cough syrup with marginal response  No other upper respiratory tract infection symptoms.  He has no extrinsic symptoms.  Not monitoring glucoses @ home.A1c 7.2 % on 7/9  Review of Systems Frontal headache, facial pain , dental pain, otic pain or otic discharge denied. No fever , chills or sweats. Extrinsic symptoms of itchy, watery eyes, sneezing, or angioedema are denied. There is no sputum production, wheezing,or  paroxysmal nocturnal dyspnea.     Objective:   Physical Exam  General appearance:good health ;well nourished; no acute distress or increased work of breathing is present.  No  lymphadenopathy about the head, neck, or axilla noted.   Eyes: No conjunctival inflammation or lid edema is present. There is no scleral icterus.  Ears:  External ear exam shows no significant lesions or deformities.  Otoscopic examination reveals clear canals, tympanic membranes are intact bilaterally without bulging, retraction, inflammation or discharge.  Nose:  External nasal examination shows no deformity or inflammation. Nasal mucosa are pink and moist without lesions or exudates. No septal dislocation or deviation.No obstruction to airflow.   Oral exam: Dental hygiene is good; lips and gums are healthy appearing.There is no oropharyngeal erythema or exudate noted.   Neck:  No deformities, thyromegaly, masses, or tenderness noted.   Supple with full range of motion without pain.   Heart:  Normal rate and regular rhythm. S1 and S2 normal without gallop, murmur, click, rub or other extra  sounds.   Lungs:Chest clear to auscultation; no wheezes, rhonchi,rales ,or rubs present.No increased work of breathing.    Extremities:  No cyanosis, edema, or clubbing  noted    Skin: Warm & dry w/o jaundice or tenting.        Assessment & Plan:  #1 URI #2 DM , ? status See orders

## 2014-03-09 ENCOUNTER — Other Ambulatory Visit: Payer: Self-pay | Admitting: Internal Medicine

## 2014-03-23 ENCOUNTER — Other Ambulatory Visit: Payer: Self-pay | Admitting: Internal Medicine

## 2014-05-02 ENCOUNTER — Ambulatory Visit (INDEPENDENT_AMBULATORY_CARE_PROVIDER_SITE_OTHER): Payer: 59 | Admitting: Physician Assistant

## 2014-05-02 VITALS — BP 110/68 | HR 68 | Temp 98.8°F | Resp 16 | Ht 69.5 in | Wt 237.6 lb

## 2014-05-02 DIAGNOSIS — R05 Cough: Secondary | ICD-10-CM

## 2014-05-02 DIAGNOSIS — J329 Chronic sinusitis, unspecified: Secondary | ICD-10-CM

## 2014-05-02 DIAGNOSIS — R059 Cough, unspecified: Secondary | ICD-10-CM

## 2014-05-02 DIAGNOSIS — R0981 Nasal congestion: Secondary | ICD-10-CM

## 2014-05-02 MED ORDER — GUAIFENESIN ER 1200 MG PO TB12: 1.0000 | ORAL_TABLET | Freq: Two times a day (BID) | ORAL | Status: AC | PRN

## 2014-05-02 MED ORDER — BENZONATATE 100 MG PO CAPS: 100.0000 mg | ORAL_CAPSULE | Freq: Three times a day (TID) | ORAL | Status: DC | PRN

## 2014-05-02 MED ORDER — HYDROCOD POLST-CHLORPHEN POLST 10-8 MG/5ML PO LQCR: 5.0000 mL | Freq: Two times a day (BID) | ORAL | Status: DC | PRN

## 2014-05-02 MED ORDER — CETIRIZINE HCL 10 MG PO TABS: 10.0000 mg | ORAL_TABLET | Freq: Every day | ORAL | Status: DC

## 2014-05-02 MED ORDER — AZITHROMYCIN 500 MG PO TABS: 500.0000 mg | ORAL_TABLET | Freq: Every day | ORAL | Status: AC

## 2014-05-02 NOTE — Progress Notes (Signed)
    MRN: [id number] DOB: 1950/10/21  Subjective:   Jonathon Armstrong is a 64 y.o. male presenting for chief complaint of Sinusitis; Sore Throat; and Cough Patient states that she has had 2 days of rhinorrhea, cough and discolored nasal mucus.  He states that the mucus just pores out of him.  It is yellowish green mucus.  He has post nasal drip, and coughs with same colored sputum.  He has no ear fullness, foever, or sore throat.  Patient denies any sinus pressure, but notes that his symptoms rarely have any sinus pressure.  He denies chest pains, palpitaitons, or leg swelling.  He has no dyspnea, or pnd.  He has been taking zyrtec, mucinex, which are not causing any relief.  He has one sick contact from a grand child who had an ear infection.    Jonathon Armstrong has a current medication list which includes the following prescription(s): vitamin c, aspirin, labetalol, metformin, micardis, spironolactone, and vytorin.  He is allergic to diltiazem hcl and penicillins.  Jonathon Armstrong  has a past medical history of Hyperlipidemia; Hypertension; Elevated glucose; Obstructive sleep apnea; Asystole; FH: colonic polyps; Diverticulosis of colon; Mitral regurgitation; Seasonal rhinitis; and Parotitis, acute (2013). Also  has past surgical history that includes Nasal sinus surgery (1994, 4287,6811); heriorraphy; Middle ear surgery; Appendectomy; asytole post nasal polypectomy (2008); Tonsillectomy and adenoidectomy; colonoscopy with polypectomy (2010); and Hernia repair.  ROS As in subjective.  Objective:   Vitals: BP 110/68 mmHg  Pulse 68  Temp(Src) 98.8 F (37.1 C) (Oral)  Resp 16  Ht 5' 9.5" (1.765 m)  Wt 237 lb 9.6 oz (107.775 kg)  BMI 34.60 kg/m2  SpO2 95%  Physical Exam  Constitutional: He is oriented to person, place, and time and well-developed, well-nourished, and in no distress. No distress.  HENT:  Nose: Rhinorrhea (Mucopurulent and thich mucus) present.  Mouth/Throat: No trismus in the jaw. No uvula  swelling. No oropharyngeal exudate, posterior oropharyngeal edema or posterior oropharyngeal erythema.  Cardiovascular: Normal rate and regular rhythm.  Exam reveals no gallop and no friction rub.   Murmur heard. Pulmonary/Chest: Effort normal and breath sounds normal. No respiratory distress. He has no wheezes.  Lymphadenopathy:       Head (right side): No tonsillar, no preauricular and no posterior auricular adenopathy present.       Head (left side): No tonsillar, no preauricular and no posterior auricular adenopathy present.    He has no cervical adenopathy.  Neurological: He is alert and oriented to person, place, and time.  Skin: Skin is warm and dry. He is not diaphoretic.  Psychiatric: Mood and affect normal.      Assessment and Plan :  64 year old male with PMH listed above is here today for chief complaint of rhinorrhea.  Hx of sinus infection and multiple procedures, and comorbidities significant to address vague symptoms more aggressively.  Treating with azithromycin for 5 days.    Sinusitis, unspecified chronicity, unspecified location - Plan: azithromycin (ZITHROMAX) 500 MG tablet, cetirizine (ZYRTEC) 10 MG tablet, Guaifenesin (MUCINEX MAXIMUM STRENGTH) 1200 MG TB12  Nasal congestion - Plan: cetirizine (ZYRTEC) 10 MG tablet, Guaifenesin (MUCINEX MAXIMUM STRENGTH) 1200 MG TB12  Cough - Plan: benzonatate (TESSALON) 100 MG capsule, chlorpheniramine-HYDROcodone (TUSSIONEX PENNKINETIC ER) 10-8 MG/5ML Jonathon Armstrong  Jonathon Drape, PA-C [facility] 2/11/201610:13 AM

## 2014-05-02 NOTE — Patient Instructions (Signed)

## 2014-07-12 ENCOUNTER — Encounter: Payer: Self-pay | Admitting: Internal Medicine

## 2014-07-20 ENCOUNTER — Encounter: Payer: Self-pay | Admitting: Internal Medicine

## 2014-07-23 ENCOUNTER — Other Ambulatory Visit: Payer: Self-pay | Admitting: Internal Medicine

## 2014-08-13 ENCOUNTER — Other Ambulatory Visit: Payer: Self-pay | Admitting: Internal Medicine

## 2014-08-14 ENCOUNTER — Other Ambulatory Visit: Payer: Self-pay

## 2014-08-14 MED ORDER — SPIRONOLACTONE 25 MG PO TABS: 25.0000 mg | ORAL_TABLET | Freq: Every day | ORAL | Status: DC

## 2014-08-25 ENCOUNTER — Other Ambulatory Visit: Payer: Self-pay | Admitting: Internal Medicine

## 2014-09-15 ENCOUNTER — Ambulatory Visit (AMBULATORY_SURGERY_CENTER): Payer: Self-pay | Admitting: *Deleted

## 2014-09-15 VITALS — Ht 70.0 in | Wt 235.0 lb

## 2014-09-15 DIAGNOSIS — Z86010 Personal history of colonic polyps: Secondary | ICD-10-CM

## 2014-09-15 NOTE — Progress Notes (Signed)
No egg or soy allergy. No anesthesia problems.  No home O2.  No diet meds.  

## 2014-09-18 ENCOUNTER — Ambulatory Visit (INDEPENDENT_AMBULATORY_CARE_PROVIDER_SITE_OTHER): Payer: 59 | Admitting: Internal Medicine

## 2014-09-18 ENCOUNTER — Other Ambulatory Visit: Payer: Self-pay | Admitting: Internal Medicine

## 2014-09-18 ENCOUNTER — Encounter: Payer: Self-pay | Admitting: Internal Medicine

## 2014-09-18 ENCOUNTER — Other Ambulatory Visit (INDEPENDENT_AMBULATORY_CARE_PROVIDER_SITE_OTHER): Payer: 59

## 2014-09-18 ENCOUNTER — Ambulatory Visit: Payer: Self-pay | Admitting: Internal Medicine

## 2014-09-18 VITALS — BP 128/82 | HR 66 | Temp 97.9°F | Resp 16 | Wt 235.0 lb

## 2014-09-18 DIAGNOSIS — E785 Hyperlipidemia, unspecified: Secondary | ICD-10-CM

## 2014-09-18 DIAGNOSIS — E1165 Type 2 diabetes mellitus with hyperglycemia: Secondary | ICD-10-CM

## 2014-09-18 DIAGNOSIS — IMO0002 Reserved for concepts with insufficient information to code with codable children: Secondary | ICD-10-CM

## 2014-09-18 DIAGNOSIS — Z86010 Personal history of colonic polyps: Secondary | ICD-10-CM | POA: Diagnosis not present

## 2014-09-18 DIAGNOSIS — E790 Hyperuricemia without signs of inflammatory arthritis and tophaceous disease: Secondary | ICD-10-CM

## 2014-09-18 DIAGNOSIS — R799 Abnormal finding of blood chemistry, unspecified: Secondary | ICD-10-CM | POA: Diagnosis not present

## 2014-09-18 DIAGNOSIS — I10 Essential (primary) hypertension: Secondary | ICD-10-CM | POA: Diagnosis not present

## 2014-09-18 LAB — HEPATIC FUNCTION PANEL
ALT: 45 U/L (ref 0–53)
AST: 39 U/L — ABNORMAL HIGH (ref 0–37)
Albumin: 4.5 g/dL (ref 3.5–5.2)
Alkaline Phosphatase: 72 U/L (ref 39–117)
Bilirubin, Direct: 0 mg/dL (ref 0.0–0.3)
Total Bilirubin: 0.5 mg/dL (ref 0.2–1.2)
Total Protein: 7.1 g/dL (ref 6.0–8.3)

## 2014-09-18 LAB — BASIC METABOLIC PANEL WITH GFR
BUN: 17 mg/dL (ref 6–23)
CO2: 25 meq/L (ref 19–32)
Calcium: 9.8 mg/dL (ref 8.4–10.5)
Chloride: 103 meq/L (ref 96–112)
Creatinine, Ser: 1.14 mg/dL (ref 0.40–1.50)
GFR: 68.49 mL/min
Glucose, Bld: 153 mg/dL — ABNORMAL HIGH (ref 70–99)
Potassium: 4.4 meq/L (ref 3.5–5.1)
Sodium: 136 meq/L (ref 135–145)

## 2014-09-18 LAB — CBC WITH DIFFERENTIAL/PLATELET
Basophils Absolute: 0 10*3/uL (ref 0.0–0.1)
Basophils Relative: 0.3 % (ref 0.0–3.0)
Eosinophils Absolute: 0.1 10*3/uL (ref 0.0–0.7)
Eosinophils Relative: 1.8 % (ref 0.0–5.0)
HCT: 40.1 % (ref 39.0–52.0)
HEMOGLOBIN: 13.8 g/dL (ref 13.0–17.0)
Lymphocytes Relative: 26.2 % (ref 12.0–46.0)
Lymphs Abs: 1.8 10*3/uL (ref 0.7–4.0)
MCHC: 34.3 g/dL (ref 30.0–36.0)
MCV: 90.1 fl (ref 78.0–100.0)
Monocytes Absolute: 0.6 10*3/uL (ref 0.1–1.0)
Monocytes Relative: 8.5 % (ref 3.0–12.0)
Neutro Abs: 4.3 10*3/uL (ref 1.4–7.7)
Neutrophils Relative %: 63.2 % (ref 43.0–77.0)
Platelets: 150 10*3/uL (ref 150.0–400.0)
RBC: 4.46 Mil/uL (ref 4.22–5.81)
RDW: 13.1 % (ref 11.5–15.5)
WBC: 6.9 10*3/uL (ref 4.0–10.5)

## 2014-09-18 LAB — LIPID PANEL
CHOL/HDL RATIO: 4
CHOLESTEROL: 126 mg/dL (ref 0–200)
HDL: 32.2 mg/dL — AB (ref >39.00)
LDL Cholesterol: 54 mg/dL (ref 0–99)
NonHDL: 93.8
TRIGLYCERIDES: 197 mg/dL — AB (ref 0.0–149.0)
VLDL: 39.4 mg/dL (ref 0.0–40.0)

## 2014-09-18 LAB — MICROALBUMIN / CREATININE URINE RATIO
CREATININE, U: 82.6 mg/dL
Microalb Creat Ratio: 0.8 mg/g (ref 0.0–30.0)
Microalb, Ur: <0.7 mg/dL (ref 0.0–1.9)

## 2014-09-18 LAB — TSH: TSH: 1.36 u[IU]/mL (ref 0.35–4.50)

## 2014-09-18 LAB — HEMOGLOBIN A1C: Hgb A1c MFr Bld: 7.2 % — ABNORMAL HIGH (ref 4.6–6.5)

## 2014-09-18 LAB — URIC ACID: Uric Acid, Serum: 8.6 mg/dL — ABNORMAL HIGH (ref 4.0–7.8)

## 2014-09-18 NOTE — Assessment & Plan Note (Signed)
A1c , urine microalbumin, BMET 

## 2014-09-18 NOTE — Assessment & Plan Note (Signed)
Colonoscopy 09/29/14

## 2014-09-18 NOTE — Progress Notes (Signed)
Pre visit review using our clinic review tool, if applicable. No additional management support is needed unless otherwise documented below in the visit note. 

## 2014-09-18 NOTE — Assessment & Plan Note (Signed)
Uric acid

## 2014-09-18 NOTE — Assessment & Plan Note (Signed)
Blood pressure goals reviewed. BMET 

## 2014-09-18 NOTE — Progress Notes (Signed)
   Subjective:    Patient ID: Jonathon Armstrong, male    DOB: 03-08-1951, 64 y.o.   MRN: [id number]  HPI The patient is here to assess status of active health conditions.  PMH, FH, & Social History reviewed & updated.   He is on a modified heart healthy diet with no added salt. He states that he is a stress eater. 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   He walks 30 minutes 3 times per week with no cardio pulmonary symptoms. Blood pressure averages less than 130/85.  He has no active GI symptoms; he has a follow-up colonoscopy scheduled 09/29/14.   He has not seen Ophthalmologist for 3 years. He does not see a Podiatrist. He is not monitoring glucoses. He denies any hypoglycemia, limb numbness or tingling, or nonhealing skin lesions.  He has a history of elevated uric acid but no history of gout.    Review of Systems Chest pain, palpitations, tachycardia, exertional dyspnea, paroxysmal nocturnal dyspnea, claudication or edema are absent. No unexplained weight loss, abdominal pain, significant dyspepsia, dysphagia, melena, rectal bleeding, or persistently small caliber stools. Dysuria, pyuria, hematuria, frequency, nocturia or polyuria are denied. Change in hair, skin, nails denied. No bowel changes of constipation or diarrhea. No intolerance to heat or cold.     Objective:   Physical Exam  Pertinent or positive findings include: Abdomen is protuberant with ventral and umbilical hernias. Dorsalis pedis pulses are slightly decreased but equal.  General appearance :adequately nourished; in no distress.BMI: 33.72  Eyes: No conjunctival inflammation or scleral icterus is present.  Oral exam:  Lips and gums are healthy appearing.There is no oropharyngeal erythema or exudate noted. Dental hygiene is  good.  Heart:  Normal rate and regular rhythm. S1 and S2 normal without gallop, murmur, click, rub or other extra sounds    Lungs:Chest clear to auscultation; no wheezes, rhonchi,rales ,or rubs present.No increased work of breathing.   Abdomen: bowel sounds normal, soft and non-tender without masses, organomegaly or hernias noted.  No guarding or rebound.   Vascular : all pulses equal ; no bruits present.  Skin:Warm & dry.  Intact without suspicious lesions or rashes ; no tenting or jaundice   Lymphatic: No lymphadenopathy is noted about the head, neck, axilla   Neuro: Strength, tone & DTRs normal.         Assessment & Plan:  See Current Assessment & Plan in Problem List under specific Diagnosis

## 2014-09-18 NOTE — Assessment & Plan Note (Signed)
Lipids, LFTs, TSH  

## 2014-09-18 NOTE — Patient Instructions (Addendum)
Minimal Blood Pressure Goal= AVERAGE < 140/90;  Ideal is an AVERAGE < 135/85. This AVERAGE should be calculated from @ least 5-7 BP readings taken @ different times of day on different days of week. You should not respond to isolated BP readings , but rather the AVERAGE for that week .Please bring your  blood pressure cuff to office visits to verify that it is reliable.It  can also be checked against the blood pressure device at the pharmacy. Finger or wrist cuffs are not dependable; an arm cuff is.  Your next office appointment will be determined based upon review of your pending labs . Those written interpretation of the lab results and instructions will be transmitted to you by mail for your records.  Critical results will be called.   Followup as needed for any active or acute issue. Please report any significant change in your symptoms.  Ophthalmology exam should be done annually.

## 2014-09-27 ENCOUNTER — Other Ambulatory Visit: Payer: Self-pay | Admitting: Internal Medicine

## 2014-09-29 ENCOUNTER — Ambulatory Visit (AMBULATORY_SURGERY_CENTER): Payer: 59 | Admitting: Internal Medicine

## 2014-09-29 ENCOUNTER — Encounter: Payer: Self-pay | Admitting: Internal Medicine

## 2014-09-29 VITALS — BP 98/54 | HR 68 | Temp 96.7°F | Resp 16 | Ht 70.0 in | Wt 235.0 lb

## 2014-09-29 DIAGNOSIS — K635 Polyp of colon: Secondary | ICD-10-CM

## 2014-09-29 DIAGNOSIS — D124 Benign neoplasm of descending colon: Secondary | ICD-10-CM | POA: Diagnosis not present

## 2014-09-29 DIAGNOSIS — Z86010 Personal history of colonic polyps: Secondary | ICD-10-CM | POA: Diagnosis not present

## 2014-09-29 MED ORDER — SODIUM CHLORIDE 0.9 % IV SOLN: 500.0000 mL | INTRAVENOUS | Status: DC

## 2014-09-29 NOTE — Op Note (Signed)
[place]  [address] [place], [id number]   COLONOSCOPY PROCEDURE REPORT  PATIENT: Jonathon Armstrong, Jonathon Armstrong  MR#: [id number] BIRTHDATE: [birth year] , 67  yrs. old GENDER: male ENDOSCOPIST: Lafayette Dragon, MD REFERRED ZS:MOLMBEM Linna Darner, M.D. PROCEDURE DATE:  09/29/2014 PROCEDURE:   Colonoscopy, surveillance and Colonoscopy with snare polypectomy First Screening Colonoscopy - Avg.  risk and is 50 yrs.  old or older - No.  Prior Negative Screening - Now for repeat screening. N/A  History of Adenoma - Now for follow-up colonoscopy & has been > or = to 3 yrs.  Yes hx of adenoma.  Has been 3 or more years since last colonoscopy.  Polyps removed today? Yes ASA CLASS:   Class II INDICATIONS:Surveillance due to prior colonic neoplasia and higher colonoscopy in 2006.  Adenomatous polyp removed.  Most recent colonoscopy in March 2011, 2 tubular adenomas removed. MEDICATIONS: Monitored anesthesia care and Propofol 200 mg IV  DESCRIPTION OF PROCEDURE:   After the risks benefits and alternatives of the procedure were thoroughly explained, informed consent was obtained.  The digital rectal exam revealed no abnormalities of the rectum.   The LB [id number]  endoscope was introduced through the anus and advanced to the cecum, which was identified by both the appendix and ileocecal valve. No adverse events experienced.   The quality of the prep was good.  The instrument was then slowly withdrawn as the colon was fully examined. Estimated blood loss is zero unless otherwise noted in this procedure report.      COLON FINDINGS: A smooth sessile polyp measuring 4 mm in size was found in the descending colon.  A polypectomy was performed with a cold snare.  The resection was complete, the polyp tissue was completely retrieved and sent to histology.   There was mild diverticulosis noted in the descending colon.  Retroflexed views revealed no abnormalities. The time to cecum = 4.47  Withdrawal time = 7.58   The scope was withdrawn and the procedure completed. COMPLICATIONS: There were no immediate complications.  ENDOSCOPIC IMPRESSION: 1.   Sessile polyp was found in the descending colon; polypectomy was performed with a cold snare 2.   Mild diverticulosis was noted in the descending colon 3. Small internal hemorrhoids  RECOMMENDATIONS: 1.  Await pathology results 2.  High fiber diet Recall colonoscopy pending path report  eSigned:  Lafayette Dragon, MD 09/29/2014 7:56 AM   cc:   PATIENT NAME:  [name], [name] MR#: [id number]

## 2014-09-29 NOTE — Progress Notes (Signed)
To recovery, report to Scott, RN, VSS 

## 2014-09-29 NOTE — Progress Notes (Signed)
Called to room to assist during endoscopic procedure.  Patient ID and intended procedure confirmed with present staff. Received instructions for my participation in the procedure from the performing physician.  

## 2014-09-29 NOTE — Patient Instructions (Signed)
YOU HAD AN ENDOSCOPIC PROCEDURE TODAY AT [facility]:   Refer to the procedure report that was given to you for any specific questions about what was found during the examination.  If the procedure report does not answer your questions, please call your gastroenterologist to clarify.  If you requested that your care partner not be given the details of your procedure findings, then the procedure report has been included in a sealed envelope for you to review at your convenience later.  YOU SHOULD EXPECT: Some feelings of bloating in the abdomen. Passage of more gas than usual.  Walking can help get rid of the air that was put into your GI tract during the procedure and reduce the bloating. If you had a lower endoscopy (such as a colonoscopy or flexible sigmoidoscopy) you may notice spotting of blood in your stool or on the toilet paper. If you underwent a bowel prep for your procedure, you may not have a normal bowel movement for a few days.  Please Note:  You might notice some irritation and congestion in your nose or some drainage.  This is from the oxygen used during your procedure.  There is no need for concern and it should clear up in a day or so.  SYMPTOMS TO REPORT IMMEDIATELY:   Following lower endoscopy (colonoscopy or flexible sigmoidoscopy):  Excessive amounts of blood in the stool  Significant tenderness or worsening of abdominal pains  Swelling of the abdomen that is new, acute  Fever of 100F or higher  For urgent or emergent issues, a gastroenterologist can be reached at any hour by calling [phone].   DIET: Your first meal following the procedure should be a small meal and then it is ok to progress to your normal diet. Heavy or fried foods are harder to digest and may make you feel nauseous or bloated.  Likewise, meals heavy in dairy and vegetables can increase bloating.  Drink plenty of fluids but you should avoid alcoholic beverages for 24  hours.  ACTIVITY:  You should plan to take it easy for the rest of today and you should NOT DRIVE or use heavy machinery until tomorrow (because of the sedation medicines used during the test).    FOLLOW UP: Our staff will call the number listed on your records the next business day following your procedure to check on you and address any questions or concerns that you may have regarding the information given to you following your procedure. If we do not reach you, we will leave a message.  However, if you are feeling well and you are not experiencing any problems, there is no need to return our call.  We will assume that you have returned to your regular daily activities without incident.  If any biopsies were taken you will be contacted by phone or by letter within the next 1-3 weeks.  Please call us at [phone] if you have not heard about the biopsies in 3 weeks.    SIGNATURES/CONFIDENTIALITY: You and/or your care partner have signed paperwork which will be entered into your electronic medical record.  These signatures attest to the fact that that the information above on your After Visit Summary has been reviewed and is understood.  Full responsibility of the confidentiality of this discharge information lies with you and/or your care-partner.  Polyp,hemorrhoid,diverticulosis, high fiber diet information given.

## 2014-10-02 ENCOUNTER — Telehealth: Payer: Self-pay | Admitting: *Deleted

## 2014-10-02 NOTE — Telephone Encounter (Signed)
  Follow up Call-  Call back number 09/29/2014  Post procedure Call Back phone  # [phone]  Permission to leave phone message Yes     Patient questions:  Do you have a fever, pain , or abdominal swelling? No. Pain Score  0 *  Have you tolerated food without any problems? Yes.    Have you been able to return to your normal activities? Yes.    Do you have any questions about your discharge instructions: Diet   No. Medications  No. Follow up visit  No.  Do you have questions or concerns about your Care? No.  Actions: * If pain score is 4 or above: No action needed, pain <4.

## 2014-10-04 ENCOUNTER — Encounter: Payer: Self-pay | Admitting: Internal Medicine

## 2014-10-06 ENCOUNTER — Other Ambulatory Visit: Payer: Self-pay | Admitting: Internal Medicine

## 2014-10-06 NOTE — Telephone Encounter (Signed)
Metformin rx sent to pharm 

## 2014-10-18 ENCOUNTER — Encounter: Payer: Self-pay | Admitting: Internal Medicine

## 2015-01-17 ENCOUNTER — Other Ambulatory Visit: Payer: Self-pay | Admitting: Internal Medicine

## 2015-01-18 ENCOUNTER — Other Ambulatory Visit: Payer: Self-pay | Admitting: Emergency Medicine

## 2015-01-18 MED ORDER — METFORMIN HCL 500 MG PO TABS: ORAL_TABLET | ORAL | Status: DC

## 2015-01-26 ENCOUNTER — Other Ambulatory Visit: Payer: Self-pay | Admitting: Internal Medicine

## 2015-02-22 ENCOUNTER — Encounter: Payer: 59 | Admitting: Allergy and Immunology

## 2015-02-23 NOTE — Progress Notes (Signed)
This encounter was created in error - please disregard.

## 2015-03-28 ENCOUNTER — Other Ambulatory Visit (INDEPENDENT_AMBULATORY_CARE_PROVIDER_SITE_OTHER): Payer: Medicare Other

## 2015-03-28 ENCOUNTER — Ambulatory Visit (INDEPENDENT_AMBULATORY_CARE_PROVIDER_SITE_OTHER): Payer: Medicare Other | Admitting: Family

## 2015-03-28 ENCOUNTER — Encounter: Payer: Self-pay | Admitting: Family

## 2015-03-28 VITALS — BP 122/80 | HR 68 | Temp 98.3°F | Resp 20 | Ht 70.0 in | Wt 231.8 lb

## 2015-03-28 DIAGNOSIS — Z0000 Encounter for general adult medical examination without abnormal findings: Secondary | ICD-10-CM

## 2015-03-28 DIAGNOSIS — IMO0001 Reserved for inherently not codable concepts without codable children: Secondary | ICD-10-CM

## 2015-03-28 DIAGNOSIS — E785 Hyperlipidemia, unspecified: Secondary | ICD-10-CM

## 2015-03-28 DIAGNOSIS — E1165 Type 2 diabetes mellitus with hyperglycemia: Secondary | ICD-10-CM | POA: Diagnosis not present

## 2015-03-28 DIAGNOSIS — Z0001 Encounter for general adult medical examination with abnormal findings: Secondary | ICD-10-CM | POA: Diagnosis not present

## 2015-03-28 DIAGNOSIS — I10 Essential (primary) hypertension: Secondary | ICD-10-CM | POA: Diagnosis not present

## 2015-03-28 DIAGNOSIS — Z23 Encounter for immunization: Secondary | ICD-10-CM

## 2015-03-28 DIAGNOSIS — R972 Elevated prostate specific antigen [PSA]: Secondary | ICD-10-CM

## 2015-03-28 LAB — LIPID PANEL
CHOLESTEROL: 146 mg/dL (ref 0–200)
HDL: 30.9 mg/dL — AB (ref >39.00)
NonHDL: 114.91
TRIGLYCERIDES: 239 mg/dL — AB (ref 0.0–149.0)
Total CHOL/HDL Ratio: 5
VLDL: 47.8 mg/dL — ABNORMAL HIGH (ref 0.0–40.0)

## 2015-03-28 LAB — COMPREHENSIVE METABOLIC PANEL
ALBUMIN: 4.6 g/dL (ref 3.5–5.2)
ALK PHOS: 79 U/L (ref 39–117)
ALT: 31 U/L (ref 0–53)
AST: 27 U/L (ref 0–37)
BILIRUBIN TOTAL: 0.5 mg/dL (ref 0.2–1.2)
BUN: 18 mg/dL (ref 6–23)
CALCIUM: 9.9 mg/dL (ref 8.4–10.5)
CO2: 24 mEq/L (ref 19–32)
CREATININE: 1.12 mg/dL (ref 0.40–1.50)
Chloride: 104 mEq/L (ref 96–112)
GFR: 69.79 mL/min (ref >60.00)
Glucose, Bld: 162 mg/dL — ABNORMAL HIGH (ref 70–99)
Potassium: 4.5 mEq/L (ref 3.5–5.1)
Sodium: 137 mEq/L (ref 135–145)
Total Protein: 6.8 g/dL (ref 6.0–8.3)

## 2015-03-28 LAB — CBC
HCT: 42.2 % (ref 39.0–52.0)
HEMOGLOBIN: 14.4 g/dL (ref 13.0–17.0)
MCHC: 34.1 g/dL (ref 30.0–36.0)
MCV: 90.2 fl (ref 78.0–100.0)
PLATELETS: 168 10*3/uL (ref 150.0–400.0)
RBC: 4.68 Mil/uL (ref 4.22–5.81)
RDW: 13 % (ref 11.5–15.5)
WBC: 7.2 10*3/uL (ref 4.0–10.5)

## 2015-03-28 LAB — HEMOGLOBIN A1C: Hgb A1c MFr Bld: 7.5 % — ABNORMAL HIGH (ref 4.6–6.5)

## 2015-03-28 LAB — PSA: PSA: 2 ng/mL (ref 0.10–4.00)

## 2015-03-28 LAB — LDL CHOLESTEROL, DIRECT: LDL DIRECT: 81 mg/dL

## 2015-03-28 MED ORDER — SPIRONOLACTONE 25 MG PO TABS: 25.0000 mg | ORAL_TABLET | Freq: Every day | ORAL | Status: DC

## 2015-03-28 NOTE — Patient Instructions (Signed)
Thank you for choosing [facility].  Summary/Instructions:  Your prescription(s) have been submitted to your pharmacy or been printed and provided for you. Please take as directed and contact our office if you believe you are having problem(s) with the medication(s) or have any questions.  Please stop by the lab on the basement level of the building for your blood work. Your results will be released to [place] (or called to you) after review, usually within 72 hours after test completion. If any changes need to be made, you will be notified at that same time.  If your symptoms worsen or fail to improve, please contact our office for further instruction, or in case of emergency go directly to the emergency room at the closest medical facility.    Health Maintenance  Topic Date Due  . Hepatitis C Screening  November 08, 1950  . OPHTHALMOLOGY EXAM  07/25/1959  . HIV Screening  07/24/1964  . ZOSTAVAX  07/24/2009  . TETANUS/TDAP  03/24/2014  . PNA vac Low Risk Adult (1 of 2 - PCV13) 07/25/2014  . HEMOGLOBIN A1C  03/20/2015  . FOOT EXAM  09/18/2015  . INFLUENZA VACCINE  10/23/2015  . COLONOSCOPY  09/28/2024    Health Maintenance, Male A healthy lifestyle and preventative care can promote health and wellness.  Maintain regular health, dental, and eye exams.  Eat a healthy diet. Foods like vegetables, fruits, whole grains, low-fat dairy products, and lean protein foods contain the nutrients you need and are low in calories. Decrease your intake of foods high in solid fats, added sugars, and salt. Get information about a proper diet from your health care provider, if necessary.  Regular physical exercise is one of the most important things you can do for your health. Most adults should get at least 150 minutes of moderate-intensity exercise (any activity that increases your heart rate and causes you to sweat) each week. In addition, most adults need muscle-strengthening exercises on 2 or more  days a week.   Maintain a healthy weight. The body mass index (BMI) is a screening tool to identify possible weight problems. It provides an estimate of body fat based on height and weight. Your health care provider can find your BMI and can help you achieve or maintain a healthy weight. For males 20 years and older:  A BMI below 18.5 is considered underweight.  A BMI of 18.5 to 24.9 is normal.  A BMI of 25 to 29.9 is considered overweight.  A BMI of 30 and above is considered obese.  Maintain normal blood lipids and cholesterol by exercising and minimizing your intake of saturated fat. Eat a balanced diet with plenty of fruits and vegetables. Blood tests for lipids and cholesterol should begin at age 40 and be repeated every 5 years. If your lipid or cholesterol levels are high, you are over age 77, or you are at high risk for heart disease, you may need your cholesterol levels checked more frequently.Ongoing high lipid and cholesterol levels should be treated with medicines if diet and exercise are not working.  If you smoke, find out from your health care provider how to quit. If you do not use tobacco, do not start.  Lung cancer screening is recommended for adults aged 49-80 years who are at high risk for developing lung cancer because of a history of smoking. A yearly low-dose CT scan of the lungs is recommended for people who have at least a 30-pack-year history of smoking and are current smokers or have  quit within the past 15 years. A pack year of smoking is smoking an average of 1 pack of cigarettes a day for 1 year (for example, a 30-pack-year history of smoking could mean smoking 1 pack a day for 30 years or 2 packs a day for 15 years). Yearly screening should continue until the smoker has stopped smoking for at least 15 years. Yearly screening should be stopped for people who develop a health problem that would prevent them from having lung cancer treatment.  If you choose to drink  alcohol, do not have more than 2 drinks per day. One drink is considered to be 12 oz (360 mL) of beer, 5 oz (150 mL) of wine, or 1.5 oz (45 mL) of liquor.  Avoid the use of street drugs. Do not share needles with anyone. Ask for help if you need support or instructions about stopping the use of drugs.  High blood pressure causes heart disease and increases the risk of stroke. High blood pressure is more likely to develop in:  People who have blood pressure in the end of the normal range (100-139/85-89 mm Hg).  People who are overweight or obese.  People who are African American.  If you are 4-65 years of age, have your blood pressure checked every 3-5 years. If you are 79 years of age or older, have your blood pressure checked every year. You should have your blood pressure measured twice--once when you are at a hospital or clinic, and once when you are not at a hospital or clinic. Record the average of the two measurements. To check your blood pressure when you are not at a hospital or clinic, you can use:  An automated blood pressure machine at a pharmacy.  A home blood pressure monitor.  If you are 47-30 years old, ask your health care provider if you should take aspirin to prevent heart disease.  Diabetes screening involves taking a blood sample to check your fasting blood sugar level. This should be done once every 3 years after age 85 if you are at a normal weight and without risk factors for diabetes. Testing should be considered at a younger age or be carried out more frequently if you are overweight and have at least 1 risk factor for diabetes.  Colorectal cancer can be detected and often prevented. Most routine colorectal cancer screening begins at the age of 20 and continues through age 40. However, your health care provider may recommend screening at an earlier age if you have risk factors for colon cancer. On a yearly basis, your health care provider may provide home test kits to  check for hidden blood in the stool. A small camera at the end of a tube may be used to directly examine the colon (sigmoidoscopy or colonoscopy) to detect the earliest forms of colorectal cancer. Talk to your health care provider about this at age 72 when routine screening begins. A direct exam of the colon should be repeated every 5-10 years through age 54, unless early forms of precancerous polyps or small growths are found.  People who are at an increased risk for hepatitis B should be screened for this virus. You are considered at high risk for hepatitis B if:  You were born in a country where hepatitis B occurs often. Talk with your health care provider about which countries are considered high risk.  Your parents were born in a high-risk country and you have not received a shot to protect against hepatitis  B (hepatitis B vaccine).  You have HIV or AIDS.  You use needles to inject street drugs.  You live with, or have sex with, someone who has hepatitis B.  You are a man who has sex with other men (MSM).  You get hemodialysis treatment.  You take certain medicines for conditions like cancer, organ transplantation, and autoimmune conditions.  Hepatitis C blood testing is recommended for all people born from [birth year] through 1965 and any individual with known risk factors for hepatitis C.  Healthy men should no longer receive prostate-specific antigen (PSA) blood tests as part of routine cancer screening. Talk to your health care provider about prostate cancer screening.  Testicular cancer screening is not recommended for adolescents or adult males who have no symptoms. Screening includes self-exam, a health care provider exam, and other screening tests. Consult with your health care provider about any symptoms you have or any concerns you have about testicular cancer.  Practice safe sex. Use condoms and avoid high-risk sexual practices to reduce the spread of sexually transmitted  infections (STIs).  You should be screened for STIs, including gonorrhea and chlamydia if:  You are sexually active and are younger than 24 years.  You are older than 24 years, and your health care provider tells you that you are at risk for this type of infection.  Your sexual activity has changed since you were last screened, and you are at an increased risk for chlamydia or gonorrhea. Ask your health care provider if you are at risk.  If you are at risk of being infected with HIV, it is recommended that you take a prescription medicine daily to prevent HIV infection. This is called pre-exposure prophylaxis (PrEP). You are considered at risk if:  You are a man who has sex with other men (MSM).  You are a heterosexual man who is sexually active with multiple partners.  You take drugs by injection.  You are sexually active with a partner who has HIV.  Talk with your health care provider about whether you are at high risk of being infected with HIV. If you choose to begin PrEP, you should first be tested for HIV. You should then be tested every 3 months for as long as you are taking PrEP.  Use sunscreen. Apply sunscreen liberally and repeatedly throughout the day. You should seek shade when your shadow is shorter than you. Protect yourself by wearing long sleeves, pants, a wide-brimmed hat, and sunglasses year round whenever you are outdoors.  Tell your health care provider of new moles or changes in moles, especially if there is a change in shape or color. Also, tell your health care provider if a mole is larger than the size of a pencil eraser.  A one-time screening for abdominal aortic aneurysm (AAA) and surgical repair of large AAAs by ultrasound is recommended for men aged 65-75 years who are current or former smokers.  Stay current with your vaccines (immunizations).   This information is not intended to replace advice given to you by your health care provider. Make sure you  discuss any questions you have with your health care provider.   Document Released: 09/06/2007 Document Revised: 03/31/2014 Document Reviewed: 08/05/2010 Elsevier Interactive Patient Education [employer].

## 2015-03-28 NOTE — Progress Notes (Signed)
Pre visit review using our clinic review tool, if applicable. No additional management support is needed unless otherwise documented below in the visit note. 

## 2015-03-28 NOTE — Assessment & Plan Note (Signed)
Reviewed and updated patient's medical, surgical, family and social history. Medications and allergies were also reviewed. Basic screenings for depression, activities of daily living, hearing, cognition and safety were performed. Provider list was updated and health plan was provided to the patient.   1) Anticipatory Guidance: Discussed importance of wearing a seatbelt while driving and not texting while driving; changing batteries in smoke detector at least once annually; wearing suntan lotion when outside; eating a balanced and moderate diet; getting physical activity at least 30 minutes per day.  2) Immunizations / Screenings / Labs:  Prevnar updated today. Due for tetanus and Zostavax which will be completed at a later time. Due for a vision screen which will be completed independently. Hepatitis C screening will be obtained through blood work. Prostate screening will be obtained through PSA. All other screenings are up-to-date per recommendations. Obtain CBC, CMET, Lipid profile and A1c.   Overall well exam with risk factors for cardiovascular disease including hypertension, hyperlipidemia, diabetes, and obesity. Discussed importance of a nutrient dense diet that is low in saturated/transfer fats and is moderate and varied. Continue physical activity. Chronic conditions will be assessed through blood work and are currently maintained on medications. Changes will be made pending blood work results. Continue other healthy lifestyle behaviors and choices. Follow-up prevention exam in 1 year. Follow-up office visit pending blood work.

## 2015-03-28 NOTE — Progress Notes (Signed)
Subjective:    Patient ID: Jonathon Armstrong, male    DOB: [birth year], 65 y.o.   MRN: [id number]  Chief Complaint  Patient presents with  . Annual Exam  . Medication Refill    HPI:  Jonathon Armstrong is a 65 y.o. male who presents today for an annual wellness visit.   1) Health Maintenance -   Diet - Averages about 3 meals per day consisting of chicken, beef, pork, fruits, vegetables and some fish; Caffiene intake of about 2-3 cups daily.   Exercise - Walk 3x per week for about 1.5 miles  2) Preventative Exams / Immunizations:  Dental -- Up to date  Vision -- Due for exam   Health Maintenance  Topic Date Due  . Hepatitis C Screening  [birth year]  . OPHTHALMOLOGY EXAM  07/25/1959  . HIV Screening  07/24/1964  . ZOSTAVAX  07/24/2009  . TETANUS/TDAP  03/24/2014  . PNA vac Low Risk Adult (1 of 2 - PCV13) 07/25/2014  . HEMOGLOBIN A1C  03/20/2015  . FOOT EXAM  09/18/2015  . INFLUENZA VACCINE  10/23/2015  . COLONOSCOPY  09/28/2024     Immunization History  Administered Date(s) Administered  . Influenza Split 01/10/2011  . Influenza Whole 02/18/2010  . Influenza-Unspecified 01/05/2013, 01/23/2015  . Tdap 03/24/2004     RISK FACTORS  Tobacco History  Smoking status  . Never Smoker   Smokeless tobacco  . Never Used     Cardiac risk factors: advanced age (older than 53 for men, 1 for women), diabetes mellitus, dyslipidemia, hypertension and obesity (BMI >= 30 kg/m2).  Depression Screen  Q1: Over the past two weeks, have you felt down, depressed or hopeless? No  Q2: Over the past two weeks, have you felt little interest or pleasure in doing things? No  Have you lost interest or pleasure in daily life? No  Do you often feel hopeless? No  Do you cry easily over simple problems? No  Activities of Daily Living In your present state of health, do you have any difficulty performing the following activities?:  Driving? No Managing money?  No Feeding yourself?  No Getting from bed to chair? No Climbing a flight of stairs? No Preparing food and eating?: No Bathing or showering? No Getting dressed: No Getting to the toilet? No Using the toilet: No Moving around from place to place: No In the past year have you fallen or had a near fall?:No   Home Safety Has smoke detector and wears seat belts. No excess sun exposure. Are there smokers in your home (other than you)?  No Do you feel safe at home?  Yes  Hearing Difficulties: No Do you often ask people to speak up or repeat themselves? No Do you experience ringing or noises in your ears? No  Do you have difficulty understanding soft or whispered voices? No    Cognitive Testing  Alert? Yes   Normal Appearance? Yes  Oriented to person? Yes  Place? Yes   Time? Yes  Recall of three objects?  Yes  Can perform simple calculations? Yes  Displays appropriate judgment? Yes  Can read the correct time from a watch face? Yes  Do you feel that you have a problem with memory? No  Do you often misplace items? No   Advanced Directives have been discussed with the patient? Living will completed that is [place].   Current Physicians/Providers and Suppliers  1. Marcos Eke, FNP  - Internal Medicine/PCP 2. Marga Melnick, MD -  Internal Medicine 3. Delfin Edis, MD - Gastroenterology    Indicate any recent Medical Services you may have received from other than [facility] providers in the past year (date may be approximate).  All answers were reviewed with the patient and necessary referrals were made:  Mauricio Po, [place]   03/28/2015   Allergies  Allergen Reactions  . Diltiazem Hcl     ? symptoms  . Penicillins     Rash with Amoxicillin with Mono in college     Outpatient Prescriptions Prior to Visit  Medication Sig Dispense Refill  . Ascorbic Acid (VITAMIN C) 1000 MG tablet Take 1,000 mg by mouth daily.    Marland Kitchen aspirin (BAYER LOW STRENGTH) 81 MG EC tablet Take 81 mg by mouth daily.      Marland Kitchen  labetalol (NORMODYNE) 300 MG tablet TAKE (1/2) TABLET TWICE DAILY. 90 tablet 3  . metFORMIN (GLUCOPHAGE) 500 MG tablet TAKE 1 TABLET AFTER LARGEST MEAL. 30 tablet 2  . spironolactone (ALDACTONE) 25 MG tablet TAKE 1 TABLET ONCE DAILY. 30 tablet 5  . telmisartan (MICARDIS) 80 MG tablet TAKE 1 TABLET ONCE DAILY. 30 tablet 5  . VYTORIN 10-20 MG per tablet TAKE (1) TABLET DAILY AT BEDTIME. 30 tablet 5  . bisacodyl (DULCOLAX) 5 MG EC tablet Take 5 mg by mouth daily as needed for moderate constipation.     No facility-administered medications prior to visit.     Past Medical History  Diagnosis Date  . Hyperlipidemia   . Hypertension   . Elevated glucose     fasting hyperglycemia  . Obstructive sleep apnea     Dr Ardis Hughs  . Asystole ([place])     "vagal response"post nasal polypectomy  . FH: colonic polyps     hx of  . Diverticulosis of colon   . Mitral regurgitation     mild & aortic Valve calcification on 2d echo;sbe prophylaxis  . Seasonal rhinitis   . Parotitis, acute 2013  . Allergy     seasonal  . Diabetes mellitus without complication ([place])     pre but take metformin  . Sleep apnea     CPAP     Past Surgical History  Procedure Laterality Date  . Nasal sinus surgery  1994, [id number]    X3  . Heriorraphy      x2  . Middle ear surgery      TM replacement  . Appendectomy    . Asytole post nasal polypectomy  2008  . Tonsillectomy and adenoidectomy    . Colonoscopy with polypectomy  2010    Dr Olevia Perches  . Hernia repair       Family History  Problem Relation Age of Onset  . Cancer Mother     liver &multiple myeloma  . Heart disease Mother     CABG, Angina  . Hyperlipidemia Mother   . Hypertension Father   . Heart disease Father     MI in 56"s  . Dementia Father   . Cancer Maternal Grandfather     bone  . Diabetes Neg Hx   . Stroke Neg Hx   . Colon cancer Neg Hx   . Other Sister     perforated bowel  . Other Sister     cns aneurysm     Social History    Social History  . Marital Status: Married    Spouse Name: N/A  . Number of Children: 2  . Years of Education: 18   Occupational History  .  retired    Social History Main Topics  . Smoking status: Never Smoker   . Smokeless tobacco: Never Used  . Alcohol Use: 1.2 oz/week    1 Glasses of wine, 1 Shots of liquor per week     Comment: socially < 2/ week  . Drug Use: No  . Sexual Activity: Yes   Other Topics Concern  . Not on file   Social History Narrative   Fun: Water, swimming, [employer]   Denies abuse and feels safe at home.   Regular exercise           Review of Systems  Constitutional: Denies fever, chills, fatigue, or significant weight gain/loss. HENT: Head: Denies headache or neck pain Ears: Denies changes in hearing, ringing in ears, earache, drainage Nose: Denies discharge, stuffiness, itching, nosebleed, sinus pain Throat: Denies sore throat, hoarseness, dry mouth, sores, thrush Eyes: Denies loss/changes in vision, pain, redness, blurry/double vision, flashing lights Cardiovascular: Denies chest pain/discomfort, tightness, palpitations, shortness of breath with activity, difficulty lying down, swelling, sudden awakening with shortness of breath Respiratory: Denies shortness of breath, cough, sputum production, wheezing Gastrointestinal: Denies dysphasia, heartburn, change in appetite, nausea, change in bowel habits, rectal bleeding, constipation, diarrhea, yellow skin or eyes Genitourinary: Denies frequency, urgency, burning/pain, blood in urine, incontinence, change in urinary strength. Musculoskeletal: Denies muscle/joint pain, stiffness, back pain, redness or swelling of joints, trauma Skin: Denies rashes, lumps, itching, dryness, color changes, or hair/nail changes Neurological: Denies dizziness, fainting, seizures, weakness, numbness, tingling, tremor Psychiatric - Denies nervousness, stress, depression or memory loss Endocrine: Denies heat or cold  intolerance, sweating, frequent urination, excessive thirst, changes in appetite Hematologic: Denies ease of bruising or bleeding    Objective:     BP 122/80 mmHg  Pulse 68  Temp(Src) 98.3 F (36.8 C) (Oral)  Resp 20  Ht _0  (1.778 m)  Wt 231 lb 12 oz (105.121 kg)  BMI 33.25 kg/m2  SpO2 98% Nursing note and vital signs reviewed.  Physical Exam  Constitutional: He is oriented to person, place, and time. He appears well-developed and well-nourished. No distress.  Cardiovascular: Normal rate, regular rhythm, normal heart sounds and intact distal pulses.   Pulmonary/Chest: Effort normal and breath sounds normal.  Neurological: He is alert and oriented to person, place, and time.  Skin: Skin is warm and dry.  Psychiatric: He has a normal mood and affect. His behavior is normal. Judgment and thought content normal.       Assessment & Plan:   During the course of the visit the patient was educated and counseled about appropriate screening and preventive services including:    Pneumococcal vaccine   Hepatitis B vaccine  Td vaccine  Prostate cancer screening  Colorectal cancer screening  Diabetes screening  Nutrition counseling   Diet review for nutrition referral? Yes ____  Not Indicated _X___   Patient Instructions (the written plan) was given to the patient.  Medicare Attestation I have personally reviewed: The patient's medical and social history Their use of alcohol, tobacco or illicit drugs Their current medications and supplements The patient's functional ability including ADLs,fall risks, home safety risks, cognitive, and hearing and visual impairment Diet and physical activities Evidence for depression or mood disorders  The patient's weight, height, BMI,  have been recorded in the chart.  I have made referrals, counseling, and provided education to the patient based on review of the above and I have provided the patient with a written personalized care  plan for preventive services.  Mauricio Po, FNP   03/28/2015    Problem List Items Addressed This Visit      Cardiovascular and Mediastinum   Essential hypertension   Relevant Medications   spironolactone (ALDACTONE) 25 MG tablet   Other Relevant Orders   CBC   Comprehensive metabolic panel   Lipid panel     Other   Hyperlipidemia   Relevant Medications   spironolactone (ALDACTONE) 25 MG tablet   Other Relevant Orders   CBC   Lipid panel   Uncontrolled diabetes mellitus ([place])   Relevant Orders   CBC   Comprehensive metabolic panel   Hemoglobin A1c   Medicare annual wellness visit, initial - Primary    Reviewed and updated patient's medical, surgical, family and social history. Medications and allergies were also reviewed. Basic screenings for depression, activities of daily living, hearing, cognition and safety were performed. Provider list was updated and health plan was provided to the patient.   1) Anticipatory Guidance: Discussed importance of wearing a seatbelt while driving and not texting while driving; changing batteries in smoke detector at least once annually; wearing suntan lotion when outside; eating a balanced and moderate diet; getting physical activity at least 30 minutes per day.  2) Immunizations / Screenings / Labs:  Prevnar updated today. Due for tetanus and Zostavax which will be completed at a later time. Due for a vision screen which will be completed independently. Hepatitis C screening will be obtained through blood work. Prostate screening will be obtained through PSA. All other screenings are up-to-date per recommendations. Obtain CBC, CMET, Lipid profile and A1c.   Overall well exam with risk factors for cardiovascular disease including hypertension, hyperlipidemia, diabetes, and obesity. Discussed importance of a nutrient dense diet that is low in saturated/transfer fats and is moderate and varied. Continue physical activity. Chronic conditions  will be assessed through blood work and are currently maintained on medications. Changes will be made pending blood work results. Continue other healthy lifestyle behaviors and choices. Follow-up prevention exam in 1 year. Follow-up office visit pending blood work.       Routine general medical examination at a health care facility   Relevant Orders   Hepatitis C antibody    Other Visit Diagnoses    Rising PSA level        Relevant Orders    PSA

## 2015-03-29 ENCOUNTER — Telehealth: Payer: Self-pay | Admitting: Family

## 2015-03-29 LAB — HEPATITIS C ANTIBODY: HCV Ab: NEGATIVE

## 2015-03-29 MED ORDER — EZETIMIBE-SIMVASTATIN 10-20 MG PO TABS: ORAL_TABLET | ORAL | Status: DC

## 2015-03-29 MED ORDER — METFORMIN HCL 500 MG PO TABS: 500.0000 mg | ORAL_TABLET | Freq: Two times a day (BID) | ORAL | Status: DC

## 2015-03-29 NOTE — Telephone Encounter (Signed)
Please inform patient that his Hepatitis C was negative. His kidney function, electrolytes, liver function, white/red blood cells and prostate are all within the normal limits. His A1c has increased slightly to 7.5 from 7.2. His ttotal and LDL or bad cholesterol are both normal. His HDL or good cholesterol is low at 30 with a goal of being as high as possible. Lastly his triglycerides were elevated at 239 with a goal of less than 150. Thus I am going to restart his Vytorin for him to take and I would like him to take the metformin twice daily. We will plan to follow up in 3 months for A1c check.

## 2015-04-03 NOTE — Telephone Encounter (Signed)
Tried calling pt and he had trouble hearing and understanding what I was saying to him. He asked that I send his results in the mail so he could look over them. Results have been mailed.

## 2015-06-02 ENCOUNTER — Ambulatory Visit (INDEPENDENT_AMBULATORY_CARE_PROVIDER_SITE_OTHER): Payer: Medicare Other | Admitting: Family Medicine

## 2015-06-02 VITALS — BP 118/70 | HR 79 | Temp 98.8°F | Resp 16 | Ht 70.0 in | Wt 224.0 lb

## 2015-06-02 DIAGNOSIS — J0191 Acute recurrent sinusitis, unspecified: Secondary | ICD-10-CM | POA: Diagnosis not present

## 2015-06-02 MED ORDER — HYDROCOD POLST-CPM POLST ER 10-8 MG/5ML PO SUER: 5.0000 mL | Freq: Two times a day (BID) | ORAL | Status: DC | PRN

## 2015-06-02 MED ORDER — AMOXICILLIN-POT CLAVULANATE 875-125 MG PO TABS: 1.0000 | ORAL_TABLET | Freq: Two times a day (BID) | ORAL | Status: DC

## 2015-06-02 NOTE — Progress Notes (Signed)
Subjective:  By signing my name below, I, Jonathon Armstrong, attest that this documentation has been prepared under the direction and in the presence of Delman Cheadle, MD.  Electronically Signed: Thea Alken, ED Scribe. 06/02/2015. 2:22 PM.   Patient ID: Jonathon Armstrong, male    DOB: 1950-05-26, 66 y.o.   MRN: [id number]  HPI Chief Complaint  Patient presents with  . Sinusitis    x 5 days  . Sore Throat    x 5 days  . Cough    x 5 days    HPI Comments: Jonathon Armstrong is a 65 y.o. male who presents to the [facility] complaining of cough that began 4 days ago. Pt reports symptoms started with sinus followed by sore throat, cough and chest congestion. He has been drinking tea and has been taking robitussin and advil without relief. He uses a nette pot 3 times a day that he's had for about 20 years. He denies fever, chills, SOB and CP.   Patient Active Problem List   Diagnosis Date Noted  . Medicare annual wellness visit, initial 03/28/2015  . Routine general medical examination at a health care facility 03/28/2015  . Uncontrolled diabetes mellitus ([place]) 02/25/2013  . Hyperlipidemia 12/05/2010  . NONSPECIFIC ABNORMAL ELECTROCARDIOGRAM 08/13/2009  . CARDIOMEGALY, MILD 03/23/2008  . HYPERURICEMIA, ASYMPTOMATIC 02/07/2008  . DIVERTICULOSIS, COLON 10/08/2007  . History of colonic polyps 10/08/2007  . Essential hypertension 03/26/2007  . ASYSTOLE 02/22/2007  . OBSTRUCTIVE SLEEP APNEA 08/28/2006   Past Medical History  Diagnosis Date  . Hyperlipidemia   . Hypertension   . Elevated glucose     fasting hyperglycemia  . Obstructive sleep apnea     Dr Ardis Hughs  . Asystole ([place])     "vagal response"post nasal polypectomy  . FH: colonic polyps     hx of  . Diverticulosis of colon   . Mitral regurgitation     mild & aortic Valve calcification on 2d echo;sbe prophylaxis  . Seasonal rhinitis   . Parotitis, acute 2013  . Allergy     seasonal  . Diabetes mellitus without  complication ([place])     pre but take metformin  . Sleep apnea     CPAP   Past Surgical History  Procedure Laterality Date  . Nasal sinus surgery  1994, [id number]    X3  . Heriorraphy      x2  . Middle ear surgery      TM replacement  . Appendectomy    . Asytole post nasal polypectomy  2008  . Tonsillectomy and adenoidectomy    . Colonoscopy with polypectomy  2010    Dr Olevia Perches  . Hernia repair     Allergies  Allergen Reactions  . Diltiazem Hcl     ? symptoms  . Penicillins     Rash with Amoxicillin with Mono in college   Prior to Admission medications   Medication Sig Start Date End Date Taking? Authorizing Provider  Ascorbic Acid (VITAMIN C) 1000 MG tablet Take 1,000 mg by mouth daily.   Yes Historical Provider, MD  aspirin (BAYER LOW STRENGTH) 81 MG EC tablet Take 81 mg by mouth daily.     Yes Historical Provider, MD  ezetimibe-simvastatin (VYTORIN) 10-20 MG tablet TAKE (1) TABLET DAILY AT BEDTIME. 03/29/15  Yes Golden Circle, FNP  labetalol (NORMODYNE) 300 MG tablet TAKE (1/2) TABLET TWICE DAILY. 09/27/14  Yes Hendricks Limes, MD  metFORMIN (GLUCOPHAGE) 500 MG tablet Take  1 tablet (500 mg total) by mouth 2 (two) times daily with a meal. 03/29/15  Yes Golden Circle, FNP  spironolactone (ALDACTONE) 25 MG tablet Take 1 tablet (25 mg total) by mouth daily. 03/28/15  Yes Golden Circle, FNP  telmisartan (MICARDIS) 80 MG tablet TAKE 1 TABLET ONCE DAILY. 01/26/15  Yes Hendricks Limes, MD   Social History   Social History  . Marital Status: Married    Spouse Name: N/A  . Number of Children: 2  . Years of Education: 18   Occupational History  . retired    Social History Main Topics  . Smoking status: Never Smoker   . Smokeless tobacco: Never Used  . Alcohol Use: 1.2 oz/week    1 Glasses of wine, 1 Shots of liquor per week     Comment: socially < 2/ week  . Drug Use: No  . Sexual Activity: Yes   Other Topics Concern  . Not on file   Social History Narrative   Fun:  Water, swimming, [employer]   Denies abuse and feels safe at home.   Regular exercise         Review of Systems  Constitutional: Negative for fever, chills and appetite change.  HENT: Positive for congestion and sore throat. Negative for trouble swallowing.   Respiratory: Positive for cough. Negative for chest tightness and shortness of breath.   Cardiovascular: Negative for chest pain.       Objective:   Physical Exam  Constitutional: He is oriented to person, place, and time. He appears well-developed and well-nourished. No distress.  HENT:  Head: Normocephalic and atraumatic.  Right Ear: A middle ear effusion is present.  Left Ear: Tympanic membrane is injected and scarred. A middle ear effusion is present.  Nose: Nose normal.  Mouth/Throat: Posterior oropharyngeal erythema present.  Eyes: Conjunctivae and EOM are normal.  Neck: Neck supple.  Cardiovascular: Normal rate.   Pulmonary/Chest: Effort normal.  Musculoskeletal: Normal range of motion.  Lymphadenopathy:    He has no cervical adenopathy.  Neurological: He is alert and oriented to person, place, and time.  Skin: Skin is warm and dry.  Psychiatric: He has a normal mood and affect. His behavior is normal.  Nursing note and vitals reviewed.   Filed Vitals:   06/02/15 1405  BP: 118/70  Pulse: 79  Temp: 98.8 F (37.1 C)  TempSrc: Oral  Resp: 16  Height: '5\' 10"'$  (1.778 m)  Weight: 224 lb (101.606 kg)  SpO2: 96%   Assessment & Plan:   1. Acute recurrent sinusitis, unspecified location     Meds ordered this encounter  Medications  . amoxicillin-clavulanate (AUGMENTIN) 875-125 MG tablet    Sig: Take 1 tablet by mouth 2 (two) times daily.    Dispense:  20 tablet    Refill:  0  . chlorpheniramine-HYDROcodone (TUSSIONEX PENNKINETIC ER) 10-8 MG/5ML SUER    Sig: Take 5 mLs by mouth every 12 (twelve) hours as needed.    Dispense:  120 mL    Refill:  0    I personally performed the services described in this  documentation, which was scribed in my presence. The recorded information has been reviewed and considered, and addended by me as needed.  Delman Cheadle, MD MPH

## 2015-06-02 NOTE — Patient Instructions (Addendum)
   IF you received an x-ray today, you will receive an invoice from [facility]. Please contact [facility] at [phone] with questions or concerns regarding your invoice.   IF you received labwork today, you will receive an invoice from [facility]. Please contact [facility] at [phone] with questions or concerns regarding your invoice.   Our billing staff will not be able to assist you with questions regarding bills from these companies.  You will be contacted with the lab results as soon as they are available. The fastest way to get your results is to activate your My Chart account. Instructions are located on the last page of this paperwork. If you have not heard from us regarding the results in 2 weeks, please contact this office.     Sinusitis, Adult Sinusitis is redness, soreness, and inflammation of the paranasal sinuses. Paranasal sinuses are air pockets within the bones of your face. They are located beneath your eyes, in the middle of your forehead, and above your eyes. In healthy paranasal sinuses, mucus is able to drain out, and air is able to circulate through them by way of your nose. However, when your paranasal sinuses are inflamed, mucus and air can become trapped. This can allow bacteria and other germs to grow and cause infection. Sinusitis can develop quickly and last only a short time (acute) or continue over a long period (chronic). Sinusitis that lasts for more than 12 weeks is considered chronic. CAUSES Causes of sinusitis include:  Allergies.  Structural abnormalities, such as displacement of the cartilage that separates your nostrils (deviated septum), which can decrease the air flow through your nose and sinuses and affect sinus drainage.  Functional abnormalities, such as when the small hairs (cilia) that line your sinuses and help remove mucus do not work properly or are not present. SIGNS AND SYMPTOMS Symptoms  of acute and chronic sinusitis are the same. The primary symptoms are pain and pressure around the affected sinuses. Other symptoms include:  Upper toothache.  Earache.  Headache.  Bad breath.  Decreased sense of smell and taste.  A cough, which worsens when you are lying flat.  Fatigue.  Fever.  Thick drainage from your nose, which often is green and may contain pus (purulent).  Swelling and warmth over the affected sinuses. DIAGNOSIS Your health care provider will perform a physical exam. During your exam, your health care provider may perform any of the following to help determine if you have acute sinusitis or chronic sinusitis:  Look in your nose for signs of abnormal growths in your nostrils (nasal polyps).  Tap over the affected sinus to check for signs of infection.  View the inside of your sinuses using an imaging device that has a light attached (endoscope). If your health care provider suspects that you have chronic sinusitis, one or more of the following tests may be recommended:  Allergy tests.  Nasal culture. A sample of mucus is taken from your nose, sent to a lab, and screened for bacteria.  Nasal cytology. A sample of mucus is taken from your nose and examined by your health care provider to determine if your sinusitis is related to an allergy. TREATMENT Most cases of acute sinusitis are related to a viral infection and will resolve on their own within 10 days. Sometimes, medicines are prescribed to help relieve symptoms of both acute and chronic sinusitis. These may include pain medicines, decongestants, nasal steroid sprays, or saline sprays. However, for sinusitis related   to a bacterial infection, your health care provider will prescribe antibiotic medicines. These are medicines that will help kill the bacteria causing the infection. Rarely, sinusitis is caused by a fungal infection. In these cases, your health care provider will prescribe antifungal  medicine. For some cases of chronic sinusitis, surgery is needed. Generally, these are cases in which sinusitis recurs more than 3 times per year, despite other treatments. HOME CARE INSTRUCTIONS  Drink plenty of water. Water helps thin the mucus so your sinuses can drain more easily.  Use a humidifier.  Inhale steam 3-4 times a day (for example, sit in the bathroom with the shower running).  Apply a warm, moist washcloth to your face 3-4 times a day, or as directed by your health care provider.  Use saline nasal sprays to help moisten and clean your sinuses.  Take medicines only as directed by your health care provider.  If you were prescribed either an antibiotic or antifungal medicine, finish it all even if you start to feel better. SEEK IMMEDIATE MEDICAL CARE IF:  You have increasing pain or severe headaches.  You have nausea, vomiting, or drowsiness.  You have swelling around your face.  You have vision problems.  You have a stiff neck.  You have difficulty breathing.   This information is not intended to replace advice given to you by your health care provider. Make sure you discuss any questions you have with your health care provider.   Document Released: 03/10/2005 Document Revised: 03/31/2014 Document Reviewed: 03/25/2011 Elsevier Interactive Patient Education 2016 Elsevier Inc.  

## 2015-06-04 ENCOUNTER — Other Ambulatory Visit: Payer: Self-pay | Admitting: Family

## 2015-06-15 ENCOUNTER — Encounter: Payer: Self-pay | Admitting: Family

## 2015-06-15 ENCOUNTER — Ambulatory Visit (INDEPENDENT_AMBULATORY_CARE_PROVIDER_SITE_OTHER): Payer: Medicare Other | Admitting: Family

## 2015-06-15 ENCOUNTER — Ambulatory Visit (INDEPENDENT_AMBULATORY_CARE_PROVIDER_SITE_OTHER)
Admission: RE | Admit: 2015-06-15 | Discharge: 2015-06-15 | Disposition: A | Discharge status: Home / Self Care | Payer: Medicare Other | Source: Ambulatory Visit | Attending: Family | Admitting: Family

## 2015-06-15 VITALS — BP 120/72 | HR 60 | Temp 98.3°F | Resp 16 | Ht 70.0 in | Wt 221.0 lb

## 2015-06-15 DIAGNOSIS — R05 Cough: Secondary | ICD-10-CM | POA: Diagnosis not present

## 2015-06-15 DIAGNOSIS — R059 Cough, unspecified: Secondary | ICD-10-CM | POA: Insufficient documentation

## 2015-06-15 DIAGNOSIS — R051 Acute cough: Secondary | ICD-10-CM | POA: Insufficient documentation

## 2015-06-15 MED ORDER — ALBUTEROL SULFATE HFA 108 (90 BASE) MCG/ACT IN AERS: 2.0000 | INHALATION_SPRAY | Freq: Four times a day (QID) | RESPIRATORY_TRACT | Status: DC | PRN

## 2015-06-15 MED ORDER — PREDNISONE 20 MG PO TABS
20.0000 mg | ORAL_TABLET | Freq: Every day | ORAL | Status: DC
Start: 2015-06-15 — End: 2016-04-30

## 2015-06-15 MED ORDER — HYDROCOD POLST-CPM POLST ER 10-8 MG/5ML PO SUER: 5.0000 mL | Freq: Two times a day (BID) | ORAL | Status: DC | PRN

## 2015-06-15 MED ORDER — BENZONATATE 100 MG PO CAPS: 100.0000 mg | ORAL_CAPSULE | Freq: Three times a day (TID) | ORAL | Status: DC | PRN

## 2015-06-15 NOTE — Assessment & Plan Note (Addendum)
Symptoms and exam consistent with post-infective cough with concern for pneumonia based on adventitious lung sounds. Obtain x-ray. Start albuterol, tessalon and Breo. Continue current dosage of Tussinex. Start prednisone if no improvement. Follow up pending x-ray results and if symptoms worsen or fail to improve.

## 2015-06-15 NOTE — Progress Notes (Signed)
Pre visit review using our clinic review tool, if applicable. No additional management support is needed unless otherwise documented below in the visit note. 

## 2015-06-15 NOTE — Patient Instructions (Signed)
Thank you for choosing [facility].  Summary/Instructions:  Your prescription(s) have been submitted to your pharmacy or been printed and provided for you. Please take as directed and contact our office if you believe you are having problem(s) with the medication(s) or have any questions.  Please stop by radiology on the basement level of the building for your x-rays. Your results will be released to MyChart (or called to you) after review, usually within 72 hours after test completion. If any treatments or changes are necessary, you will be notified at that same time.  If your symptoms worsen or fail to improve, please contact our office for further instruction, or in case of emergency go directly to the emergency room at the closest medical facility.   General Recommendations:    Please drink plenty of fluids.  Get plenty of rest   Sleep in humidified air  Use saline nasal sprays  Netti pot   OTC Medications:  Decongestants - helps relieve congestion   Flonase (generic fluticasone) or Nasacort (generic triamcinolone) - please make sure to use the "cross-over" technique at a 45 degree angle towards the opposite eye as opposed to straight up the nasal passageway.   Sudafed (generic pseudoephedrine - Note this is the one that is available behind the pharmacy counter); Products with phenylephrine (-PE) may also be used but is often not as effective as pseudoephedrine.   If you have HIGH BLOOD PRESSURE - Coricidin HBP; AVOID any product that is -D as this contains pseudoephedrine which may increase your blood pressure.  Afrin (oxymetazoline) every 6-8 hours for up to 3 days.   Allergies - helps relieve runny nose, itchy eyes and sneezing   Claritin (generic loratidine), Allegra (fexofenidine), or Zyrtec (generic cyrterizine) for runny nose. These medications should not cause drowsiness.  Note - Benadryl (generic diphenhydramine) may be used however may cause  drowsiness  Cough -   Delsym or Robitussin (generic dextromethorphan)  Expectorants - helps loosen mucus to ease removal   Mucinex (generic guaifenesin) as directed on the package.  Headaches / General Aches   Tylenol (generic acetaminophen) - DO NOT EXCEED 3 grams (3,000 mg) in a 24 hour time period  Advil/Motrin (generic ibuprofen)   Sore Throat -   Salt water gargle   Chloraseptic (generic benzocaine) spray or lozenges / Sucrets (generic dyclonine)     

## 2015-06-15 NOTE — Progress Notes (Signed)
Subjective:    Patient ID: Jonathon Armstrong, male    DOB: [birth year], 65 y.o.   MRN: [id number]  Chief Complaint  Patient presents with  . Cough    has a bad cough that are causing spasms where it sounds like he can not breathe    HPI:  Jonathon Armstrong is a 65 y.o. male who  has a past medical history of Hyperlipidemia; Hypertension; Elevated glucose; Obstructive sleep apnea; Asystole ([place]); FH: colonic polyps; Diverticulosis of colon; Mitral regurgitation; Seasonal rhinitis; Parotitis, acute (2013); Allergy; Diabetes mellitus without complication ([place]); and Sleep apnea. and presents todayFor an acute office visit.  Previously seen in urgent care and diagnosed with acute recurrent sinusitis that was treated with Augmentin and Tussinex. Reports improvement following the completion of the antibiotic. Continues to experience the associated symptom of a cough that has lingered from the previous infection. This has now been going on for about 2 weeks. Describes some wheezing. Modifying factors include Tussinex and Robitussin which has helped some. No fevers.   Allergies  Allergen Reactions  . Diltiazem Hcl     ? symptoms  . Penicillins     Rash with Amoxicillin with Mono in college     Current Outpatient Prescriptions on File Prior to Visit  Medication Sig Dispense Refill  . Ascorbic Acid (VITAMIN C) 1000 MG tablet Take 1,000 mg by mouth daily.    Marland Kitchen aspirin (BAYER LOW STRENGTH) 81 MG EC tablet Take 81 mg by mouth daily.      Marland Kitchen labetalol (NORMODYNE) 300 MG tablet TAKE (1/2) TABLET TWICE DAILY. 90 tablet 3  . metFORMIN (GLUCOPHAGE) 500 MG tablet Take 1 tablet (500 mg total) by mouth 2 (two) times daily with a meal. 60 tablet 2  . spironolactone (ALDACTONE) 25 MG tablet Take 1 tablet (25 mg total) by mouth daily. 30 tablet 5  . telmisartan (MICARDIS) 80 MG tablet TAKE 1 TABLET ONCE DAILY. 30 tablet 5  . VYTORIN 10-20 MG tablet TAKE (1) TABLET DAILY AT BEDTIME. 30 tablet 0   No current  facility-administered medications on file prior to visit.     Past Surgical History  Procedure Laterality Date  . Nasal sinus surgery  1994, [id number]    X3  . Heriorraphy      x2  . Middle ear surgery      TM replacement  . Appendectomy    . Asytole post nasal polypectomy  2008  . Tonsillectomy and adenoidectomy    . Colonoscopy with polypectomy  2010    Dr Olevia Perches  . Hernia repair      Review of Systems  Constitutional: Negative for fever and chills.  HENT: Negative for sinus pressure and sore throat.   Respiratory: Positive for cough and shortness of breath. Negative for chest tightness.   Neurological: Negative for headaches.      Objective:    BP 120/72 mmHg  Pulse 60  Temp(Src) 98.3 F (36.8 C) (Oral)  Resp 16  Ht '5\' 10"'$  (1.778 m)  Wt 221 lb (100.245 kg)  BMI 31.71 kg/m2  SpO2 97% Nursing note and vital signs reviewed.  Physical Exam  Constitutional: He is oriented to person, place, and time. He appears well-developed and well-nourished. No distress.  HENT:  Right Ear: Hearing, tympanic membrane, external ear and ear canal normal.  Left Ear: Hearing, tympanic membrane, external ear and ear canal normal.  Nose: Right sinus exhibits no maxillary sinus tenderness and no frontal sinus tenderness. Left sinus exhibits  no maxillary sinus tenderness and no frontal sinus tenderness.  Mouth/Throat: Uvula is midline, oropharynx is clear and moist and mucous membranes are normal.  Cardiovascular: Normal rate, regular rhythm, normal heart sounds and intact distal pulses.   Pulmonary/Chest: Effort normal and breath sounds normal. He has no wheezes.  Neurological: He is alert and oriented to person, place, and time.  Skin: Skin is warm and dry.  Psychiatric: He has a normal mood and affect. His behavior is normal. Judgment and thought content normal.       Assessment & Plan:   Problem List Items Addressed This Visit      Other   Cough - Primary    Symptoms and exam  consistent with post-infective cough with concern for pneumonia based on adventitious lung sounds. Obtain x-ray. Start albuterol and Breo. Continue current dosage of Tussinex. Start prednisone if no improvement. Follow up pending x-ray results and if symptoms worsen or fail to improve.       Relevant Medications   benzonatate (TESSALON PERLES) 100 MG capsule   predniSONE (DELTASONE) 20 MG tablet   albuterol (PROVENTIL HFA;VENTOLIN HFA) 108 (90 Base) MCG/ACT inhaler   Other Relevant Orders   DG Chest 2 View

## 2015-06-16 ENCOUNTER — Telehealth: Payer: Self-pay | Admitting: Family

## 2015-06-16 NOTE — Telephone Encounter (Signed)
Please inform patient that his x-rays are negative for pneumonia therefore no antibiotics are needed at this time.

## 2015-06-18 NOTE — Telephone Encounter (Signed)
Pt aware of results 

## 2015-07-09 ENCOUNTER — Other Ambulatory Visit: Payer: Self-pay | Admitting: Family

## 2015-08-06 ENCOUNTER — Other Ambulatory Visit: Payer: Self-pay | Admitting: Internal Medicine

## 2015-08-06 ENCOUNTER — Other Ambulatory Visit: Payer: Self-pay | Admitting: Family

## 2015-09-03 DIAGNOSIS — L720 Epidermal cyst: Secondary | ICD-10-CM | POA: Diagnosis not present

## 2015-09-03 DIAGNOSIS — L821 Other seborrheic keratosis: Secondary | ICD-10-CM | POA: Diagnosis not present

## 2015-09-03 DIAGNOSIS — H43811 Vitreous degeneration, right eye: Secondary | ICD-10-CM | POA: Diagnosis not present

## 2015-09-03 DIAGNOSIS — D2362 Other benign neoplasm of skin of left upper limb, including shoulder: Secondary | ICD-10-CM | POA: Diagnosis not present

## 2015-09-03 DIAGNOSIS — H2513 Age-related nuclear cataract, bilateral: Secondary | ICD-10-CM | POA: Diagnosis not present

## 2015-09-03 DIAGNOSIS — L738 Other specified follicular disorders: Secondary | ICD-10-CM | POA: Diagnosis not present

## 2015-09-03 DIAGNOSIS — H5213 Myopia, bilateral: Secondary | ICD-10-CM | POA: Diagnosis not present

## 2015-09-03 DIAGNOSIS — L918 Other hypertrophic disorders of the skin: Secondary | ICD-10-CM | POA: Diagnosis not present

## 2015-09-03 DIAGNOSIS — Z7984 Long term (current) use of oral hypoglycemic drugs: Secondary | ICD-10-CM | POA: Diagnosis not present

## 2015-09-03 DIAGNOSIS — H52223 Regular astigmatism, bilateral: Secondary | ICD-10-CM | POA: Diagnosis not present

## 2015-09-03 DIAGNOSIS — H18413 Arcus senilis, bilateral: Secondary | ICD-10-CM | POA: Diagnosis not present

## 2015-09-03 DIAGNOSIS — E119 Type 2 diabetes mellitus without complications: Secondary | ICD-10-CM | POA: Diagnosis not present

## 2015-09-03 DIAGNOSIS — H524 Presbyopia: Secondary | ICD-10-CM | POA: Diagnosis not present

## 2015-09-17 ENCOUNTER — Other Ambulatory Visit: Payer: Self-pay | Admitting: Internal Medicine

## 2015-09-20 ENCOUNTER — Other Ambulatory Visit: Payer: Self-pay | Admitting: Family

## 2016-02-06 DIAGNOSIS — Z23 Encounter for immunization: Secondary | ICD-10-CM | POA: Diagnosis not present

## 2016-02-07 ENCOUNTER — Other Ambulatory Visit: Payer: Self-pay | Admitting: Family

## 2016-03-09 ENCOUNTER — Other Ambulatory Visit: Payer: Self-pay | Admitting: Family

## 2016-03-25 ENCOUNTER — Other Ambulatory Visit: Payer: Self-pay | Admitting: Family

## 2016-04-11 ENCOUNTER — Other Ambulatory Visit: Payer: Self-pay | Admitting: Family

## 2016-04-25 ENCOUNTER — Other Ambulatory Visit: Payer: Self-pay | Admitting: Family

## 2016-04-30 ENCOUNTER — Ambulatory Visit (INDEPENDENT_AMBULATORY_CARE_PROVIDER_SITE_OTHER): Payer: Medicare Other | Admitting: Family

## 2016-04-30 ENCOUNTER — Encounter: Payer: Self-pay | Admitting: Family

## 2016-04-30 ENCOUNTER — Other Ambulatory Visit (INDEPENDENT_AMBULATORY_CARE_PROVIDER_SITE_OTHER): Payer: Medicare Other

## 2016-04-30 VITALS — BP 122/78 | HR 65 | Temp 98.1°F | Resp 16 | Ht 70.0 in | Wt 222.0 lb

## 2016-04-30 DIAGNOSIS — E782 Mixed hyperlipidemia: Secondary | ICD-10-CM

## 2016-04-30 DIAGNOSIS — IMO0001 Reserved for inherently not codable concepts without codable children: Secondary | ICD-10-CM

## 2016-04-30 DIAGNOSIS — Z0000 Encounter for general adult medical examination without abnormal findings: Secondary | ICD-10-CM | POA: Diagnosis not present

## 2016-04-30 DIAGNOSIS — E1165 Type 2 diabetes mellitus with hyperglycemia: Secondary | ICD-10-CM

## 2016-04-30 DIAGNOSIS — Z125 Encounter for screening for malignant neoplasm of prostate: Secondary | ICD-10-CM | POA: Diagnosis not present

## 2016-04-30 DIAGNOSIS — I10 Essential (primary) hypertension: Secondary | ICD-10-CM

## 2016-04-30 DIAGNOSIS — E1149 Type 2 diabetes mellitus with other diabetic neurological complication: Secondary | ICD-10-CM | POA: Diagnosis not present

## 2016-04-30 DIAGNOSIS — L4050 Arthropathic psoriasis, unspecified: Secondary | ICD-10-CM

## 2016-04-30 DIAGNOSIS — R972 Elevated prostate specific antigen [PSA]: Secondary | ICD-10-CM

## 2016-04-30 DIAGNOSIS — E1142 Type 2 diabetes mellitus with diabetic polyneuropathy: Secondary | ICD-10-CM

## 2016-04-30 DIAGNOSIS — E1129 Type 2 diabetes mellitus with other diabetic kidney complication: Secondary | ICD-10-CM

## 2016-04-30 DIAGNOSIS — E1140 Type 2 diabetes mellitus with diabetic neuropathy, unspecified: Secondary | ICD-10-CM

## 2016-04-30 DIAGNOSIS — E1121 Type 2 diabetes mellitus with diabetic nephropathy: Secondary | ICD-10-CM

## 2016-04-30 DIAGNOSIS — E756 Lipid storage disorder, unspecified: Secondary | ICD-10-CM

## 2016-04-30 DIAGNOSIS — Z23 Encounter for immunization: Secondary | ICD-10-CM | POA: Diagnosis not present

## 2016-04-30 DIAGNOSIS — IMO0002 Reserved for concepts with insufficient information to code with codable children: Secondary | ICD-10-CM

## 2016-04-30 DIAGNOSIS — G4733 Obstructive sleep apnea (adult) (pediatric): Secondary | ICD-10-CM | POA: Diagnosis not present

## 2016-04-30 DIAGNOSIS — N058 Unspecified nephritic syndrome with other morphologic changes: Secondary | ICD-10-CM

## 2016-04-30 LAB — COMPREHENSIVE METABOLIC PANEL
ALT: 24 U/L (ref 0–53)
AST: 19 U/L (ref 0–37)
Albumin: 4.8 g/dL (ref 3.5–5.2)
Alkaline Phosphatase: 74 U/L (ref 39–117)
BUN: 17 mg/dL (ref 6–23)
CHLORIDE: 106 meq/L (ref 96–112)
CO2: 26 meq/L (ref 19–32)
CREATININE: 1.09 mg/dL (ref 0.40–1.50)
Calcium: 10 mg/dL (ref 8.4–10.5)
GFR: 71.77 mL/min (ref >60.00)
Glucose, Bld: 116 mg/dL — ABNORMAL HIGH (ref 70–99)
Potassium: 4.5 mEq/L (ref 3.5–5.1)
SODIUM: 139 meq/L (ref 135–145)
Total Bilirubin: 0.4 mg/dL (ref 0.2–1.2)
Total Protein: 7.1 g/dL (ref 6.0–8.3)

## 2016-04-30 LAB — PSA, MEDICARE: PSA: 2.11 ng/mL (ref 0.10–4.00)

## 2016-04-30 LAB — LIPID PANEL
CHOL/HDL RATIO: 4
Cholesterol: 127 mg/dL (ref 0–200)
HDL: 33.8 mg/dL — ABNORMAL LOW (ref >39.00)
NONHDL: 93.55
Triglycerides: 268 mg/dL — ABNORMAL HIGH (ref 0.0–149.0)
VLDL: 53.6 mg/dL — ABNORMAL HIGH (ref 0.0–40.0)

## 2016-04-30 LAB — CBC
HCT: 39.1 % (ref 39.0–52.0)
Hemoglobin: 13.5 g/dL (ref 13.0–17.0)
MCHC: 34.7 g/dL (ref 30.0–36.0)
MCV: 89.1 fl (ref 78.0–100.0)
Platelets: 167 10*3/uL (ref 150.0–400.0)
RBC: 4.39 Mil/uL (ref 4.22–5.81)
RDW: 12.9 % (ref 11.5–15.5)
WBC: 6.8 10*3/uL (ref 4.0–10.5)

## 2016-04-30 LAB — LDL CHOLESTEROL, DIRECT: Direct LDL: 62 mg/dL

## 2016-04-30 LAB — MICROALBUMIN / CREATININE URINE RATIO
Creatinine,U: 133.2 mg/dL
Microalb Creat Ratio: 1 mg/g (ref 0.0–30.0)
Microalb, Ur: 1.3 mg/dL (ref 0.0–1.9)

## 2016-04-30 LAB — HEMOGLOBIN A1C: HEMOGLOBIN A1C: 6.6 % — AB (ref 4.6–6.5)

## 2016-04-30 NOTE — Addendum Note (Signed)
Addended by: Delice Bison E on: 04/30/2016 01:28 PM   Modules accepted: Orders

## 2016-04-30 NOTE — Assessment & Plan Note (Addendum)
Reviewed and updated patient's medical, surgical, family and social history. Medications and allergies were also reviewed. Basic screenings for depression, activities of daily living, hearing, cognition and safety were performed. Provider list was updated and health plan was provided to the patient.   Pneumovax updated today. Discussed plan for Zostavax. Declines tetanus. All other immunizations are up-to-date per recommendations. Obtain PSA for prostate cancer screening. Obtain hemoglobin A1c and urine microalbumin for diabetes screening. Diabetic foot exam completed today. All other screenings are up-to-date per recommendations.  Overall well exam with risk factors for cardiovascular disease including type 2 diabetes, hypertension, hyperlipidemia. Chronic conditions appear adequately managed through medication regimens and discussed under respective sections. Recommend weight loss of 5-10% of current body weight through nutrition and physical activity. His goal is to maintain his health. Continue healthy lifestyle behaviors and choices. Follow-up prevention exam in 1 year. Follow-up office visit for chronic conditions pending blood work.

## 2016-04-30 NOTE — Assessment & Plan Note (Signed)
Stable and reports using the positive airway pressure nightly. Continue current positive airway pressure. Recommend weight loss to assist. Continue to monitor.

## 2016-04-30 NOTE — Patient Instructions (Addendum)
Thank you for choosing [facility].  SUMMARY AND INSTRUCTIONS:  Medication:  Your prescription(s) have been submitted to your pharmacy or been printed and provided for you. Please take as directed and contact our office if you believe you are having problem(s) with the medication(s) or questions.  Labs:  Please stop by the lab on the lower level of the building for your blood work. Your results will be released to [place] (or called to you) after review, usually within 72 hours after test completion. If any changes need to be made, you will be notified at that same time.  1.) The lab is open from 7:30am to 5:30 pm Monday-Friday 2.) No appointment is necessary 3.) Fasting (if needed) is 6-8 hours after food and drink; black coffee and water are okay   Follow up:  If your symptoms worsen or fail to improve, please contact our office for further instruction, or in case of emergency go directly to the emergency room at the closest medical facility.   Health Maintenance  Topic Date Due  . OPHTHALMOLOGY EXAM  07/25/1960  . ZOSTAVAX  07/24/2009  . TETANUS/TDAP  03/24/2014  . FOOT EXAM  09/18/2015  . HEMOGLOBIN A1C  09/25/2015  . PNA vac Low Risk Adult (2 of 2 - PPSV23) 03/27/2016  . COLONOSCOPY  09/28/2024  . INFLUENZA VACCINE  Completed  . Hepatitis C Screening  Completed   Health Maintenance, Male A healthy lifestyle and preventative care can promote health and wellness.  Maintain regular health, dental, and eye exams.  Eat a healthy diet. Foods like vegetables, fruits, whole grains, low-fat dairy products, and lean protein foods contain the nutrients you need and are low in calories. Decrease your intake of foods high in solid fats, added sugars, and salt. Get information about a proper diet from your health care provider, if necessary.  Regular physical exercise is one of the most important things you can do for your health. Most adults should get at least 150 minutes of  moderate-intensity exercise (any activity that increases your heart rate and causes you to sweat) each week. In addition, most adults need muscle-strengthening exercises on 2 or more days a week.   Maintain a healthy weight. The body mass index (BMI) is a screening tool to identify possible weight problems. It provides an estimate of body fat based on height and weight. Your health care provider can find your BMI and can help you achieve or maintain a healthy weight. For males 20 years and older:  A BMI below 18.5 is considered underweight.  A BMI of 18.5 to 24.9 is normal.  A BMI of 25 to 29.9 is considered overweight.  A BMI of 30 and above is considered obese.  Maintain normal blood lipids and cholesterol by exercising and minimizing your intake of saturated fat. Eat a balanced diet with plenty of fruits and vegetables. Blood tests for lipids and cholesterol should begin at age 70 and be repeated every 5 years. If your lipid or cholesterol levels are high, you are over age 50, or you are at high risk for heart disease, you may need your cholesterol levels checked more frequently.Ongoing high lipid and cholesterol levels should be treated with medicines if diet and exercise are not working.  If you smoke, find out from your health care provider how to quit. If you do not use tobacco, do not start.  Lung cancer screening is recommended for adults aged 11-80 years who are at high risk for developing lung  cancer because of a history of smoking. A yearly low-dose CT scan of the lungs is recommended for people who have at least a 30-pack-year history of smoking and are current smokers or have quit within the past 15 years. A pack year of smoking is smoking an average of 1 pack of cigarettes a day for 1 year (for example, a 30-pack-year history of smoking could mean smoking 1 pack a day for 30 years or 2 packs a day for 15 years). Yearly screening should continue until the smoker has stopped smoking  for at least 15 years. Yearly screening should be stopped for people who develop a health problem that would prevent them from having lung cancer treatment.  If you choose to drink alcohol, do not have more than 2 drinks per day. One drink is considered to be 12 oz (360 mL) of beer, 5 oz (150 mL) of wine, or 1.5 oz (45 mL) of liquor.  Avoid the use of street drugs. Do not share needles with anyone. Ask for help if you need support or instructions about stopping the use of drugs.  High blood pressure causes heart disease and increases the risk of stroke. High blood pressure is more likely to develop in:  People who have blood pressure in the end of the normal range (100-139/85-89 mm Hg).  People who are overweight or obese.  People who are African American.  If you are 50-31 years of age, have your blood pressure checked every 3-5 years. If you are 76 years of age or older, have your blood pressure checked every year. You should have your blood pressure measured twice-once when you are at a hospital or clinic, and once when you are not at a hospital or clinic. Record the average of the two measurements. To check your blood pressure when you are not at a hospital or clinic, you can use:  An automated blood pressure machine at a pharmacy.  A home blood pressure monitor.  If you are 66-7 years old, ask your health care provider if you should take aspirin to prevent heart disease.  Diabetes screening involves taking a blood sample to check your fasting blood sugar level. This should be done once every 3 years after age 13 if you are at a normal weight and without risk factors for diabetes. Testing should be considered at a younger age or be carried out more frequently if you are overweight and have at least 1 risk factor for diabetes.  Colorectal cancer can be detected and often prevented. Most routine colorectal cancer screening begins at the age of 35 and continues through age 39. However, your  health care provider may recommend screening at an earlier age if you have risk factors for colon cancer. On a yearly basis, your health care provider may provide home test kits to check for hidden blood in the stool. A small camera at the end of a tube may be used to directly examine the colon (sigmoidoscopy or colonoscopy) to detect the earliest forms of colorectal cancer. Talk to your health care provider about this at age 73 when routine screening begins. A direct exam of the colon should be repeated every 5-10 years through age 65, unless early forms of precancerous polyps or small growths are found.  People who are at an increased risk for hepatitis B should be screened for this virus. You are considered at high risk for hepatitis B if:  You were born in a country where hepatitis B occurs  often. Talk with your health care provider about which countries are considered high risk.  Your parents were born in a high-risk country and you have not received a shot to protect against hepatitis B (hepatitis B vaccine).  You have HIV or AIDS.  You use needles to inject street drugs.  You live with, or have sex with, someone who has hepatitis B.  You are a man who has sex with other men (MSM).  You get hemodialysis treatment.  You take certain medicines for conditions like cancer, organ transplantation, and autoimmune conditions.  Hepatitis C blood testing is recommended for all people born from [birth year] through 1966 and any individual with known risk factors for hepatitis C.  Healthy men should no longer receive prostate-specific antigen (PSA) blood tests as part of routine cancer screening. Talk to your health care provider about prostate cancer screening.  Testicular cancer screening is not recommended for adolescents or adult males who have no symptoms. Screening includes self-exam, a health care provider exam, and other screening tests. Consult with your health care provider about any symptoms you  have or any concerns you have about testicular cancer.  Practice safe sex. Use condoms and avoid high-risk sexual practices to reduce the spread of sexually transmitted infections (STIs).  You should be screened for STIs, including gonorrhea and chlamydia if:  You are sexually active and are younger than 24 years.  You are older than 24 years, and your health care provider tells you that you are at risk for this type of infection.  Your sexual activity has changed since you were last screened, and you are at an increased risk for chlamydia or gonorrhea. Ask your health care provider if you are at risk.  If you are at risk of being infected with HIV, it is recommended that you take a prescription medicine daily to prevent HIV infection. This is called pre-exposure prophylaxis (PrEP). You are considered at risk if:  You are a man who has sex with other men (MSM).  You are a heterosexual man who is sexually active with multiple partners.  You take drugs by injection.  You are sexually active with a partner who has HIV.  Talk with your health care provider about whether you are at high risk of being infected with HIV. If you choose to begin PrEP, you should first be tested for HIV. You should then be tested every 3 months for as long as you are taking PrEP.  Use sunscreen. Apply sunscreen liberally and repeatedly throughout the day. You should seek shade when your shadow is shorter than you. Protect yourself by wearing long sleeves, pants, a wide-brimmed hat, and sunglasses year round whenever you are outdoors.  Tell your health care provider of new moles or changes in moles, especially if there is a change in shape or color. Also, tell your health care provider if a mole is larger than the size of a pencil eraser.  A one-time screening for abdominal aortic aneurysm (AAA) and surgical repair of large AAAs by ultrasound is recommended for men aged 46-75 years who are current or former  smokers.  Stay current with your vaccines (immunizations). This information is not intended to replace advice given to you by your health care provider. Make sure you discuss any questions you have with your health care provider. Document Released: 09/06/2007 Document Revised: 03/31/2014 Document Reviewed: 12/12/2014 Elsevier Interactive Patient Education  2017 [employer].

## 2016-04-30 NOTE — Assessment & Plan Note (Signed)
Appears stable with Vytorin with no adverse side effects. Obtain lipid profile. Continue current dosage of Vytorin pending lipid profile results.

## 2016-04-30 NOTE — Assessment & Plan Note (Signed)
Blood pressure well-controlled and below goal 140/90 with current regimen and no adverse side effects. Denies worst headache of life with no new symptoms of end organ damage noted on physical exam. Continue to monitor blood pressure at home and follow low-sodium diet. Continue current dosage of labetalol, spironolactone, and telmisartan.

## 2016-04-30 NOTE — Assessment & Plan Note (Signed)
Obtain A1c and urine microalbumin. Diabetic foot exam completed today. Eye exam is up to date. Maintained on telmisartan for CAD risk reduction. Pneumovax updated today. Continue current dosage of metformin pending A1c results. Continue monitor.

## 2016-04-30 NOTE — Progress Notes (Signed)
Subjective:    Patient ID: Jonathon Armstrong, male    DOB: [birth year], 66 y.o.   MRN: [id number]  Chief Complaint  Patient presents with  . CPE    not fasting    HPI:  Jonathon Armstrong is a 66 y.o. male who presents today for a Medicare Annual Wellness/Physical exam.    1) Health Maintenance -   Diet - Averaging about 3 meals per day consisting of a regular diet; Caffeine intake of about 2-3 cups per day  Exercise - 2-3x per week cardio walking a couple of miles per occurance  2) Preventative Exams / Immunizations:  Dental -- Up to date  Vision -- Up to date   Health Maintenance  Topic Date Due  . OPHTHALMOLOGY EXAM  07/25/1959  . ZOSTAVAX  07/24/2009  . TETANUS/TDAP  03/24/2014  . FOOT EXAM  09/18/2015  . HEMOGLOBIN A1C  09/25/2015  . PNA vac Low Risk Adult (2 of 2 - PPSV23) 03/27/2016  . COLONOSCOPY  09/28/2024  . INFLUENZA VACCINE  Completed  . Hepatitis C Screening  Completed     Immunization History  Administered Date(s) Administered  . Influenza Split 01/10/2011  . Influenza Whole 02/18/2010  . Influenza-Unspecified 01/05/2013, 01/23/2015, 02/06/2016  . Pneumococcal Conjugate-13 03/28/2015  . Pneumococcal Polysaccharide-23 04/30/2016  . Tdap 03/24/2004    RISK FACTORS  Tobacco History  Smoking Status  . Never Smoker  Smokeless Tobacco  . Never Used     Cardiac risk factors: advanced age (older than 72 for men, 92 for women), diabetes mellitus, dyslipidemia, hypertension, male gender and obesity (BMI >= 30 kg/m2).  Depression Screen  Depression screen [facility] 2/9 04/30/2016  Decreased Interest 0  Down, Depressed, Hopeless 0  PHQ - 2 Score 0     Activities of Daily Living In your present state of health, do you have any difficulty performing the following activities?:  Driving? No Managing money?  No Feeding yourself? No Getting from bed to chair? No Climbing a flight of stairs? No Preparing food and eating?: No Bathing or showering?  No Getting dressed: No Getting to the toilet? No Using the toilet: No Moving around from place to place: No In the past year have you fallen or had a near fall?:No   Home Safety Has smoke detector and wears seat belts.  No excess sun exposure. Are there smokers in your home (other than you)?  No Do you feel safe at home?  Yes  Hearing Difficulties: No Do you often ask people to speak up or repeat themselves? No Do you experience ringing or noises in your ears? No  Do you have difficulty understanding soft or whispered voices? No    Cognitive Testing  Alert? Yes   Normal Appearance? Yes  Oriented to person? Yes  Place? Yes   Time? Yes  Recall of three objects?  Yes  Can perform simple calculations? Yes  Displays appropriate judgment? Yes  Can read the correct time from a watch face? Yes  Do you feel that you have a problem with memory? No  Do you often misplace items? No   Advanced Directives have been discussed with the patient? Yes   Current Physicians/Providers and Suppliers  1. Terri Piedra, FNP - Internal Medicine  Indicate any recent Medical Services you may have received from other than [facility] providers in the past year (date may be approximate).  All answers were reviewed with the patient and necessary referrals were made:  Mauricio Po, [place]  04/30/2016    Allergies  Allergen Reactions  . Diltiazem Hcl     ? symptoms  . Penicillins     Rash with Amoxicillin with Mono in college     Outpatient Medications Prior to Visit  Medication Sig Dispense Refill  . Ascorbic Acid (VITAMIN C) 1000 MG tablet Take 1,000 mg by mouth daily.    Marland Kitchen aspirin (BAYER LOW STRENGTH) 81 MG EC tablet Take 81 mg by mouth daily.      Marland Kitchen labetalol (NORMODYNE) 300 MG tablet TAKE (1/2) TABLET TWICE DAILY. 30 tablet 0  . metFORMIN (GLUCOPHAGE) 500 MG tablet TAKE 1 TABLET TWICE DAILY WITH FOOD. 60 tablet 11  . spironolactone (ALDACTONE) 25 MG tablet TAKE 1 TABLET ONCE DAILY. 30 tablet 11   . telmisartan (MICARDIS) 80 MG tablet Take 1 tablet (80 mg total) by mouth daily. Needs office visit for more refills 30 tablet 0  . VYTORIN 10-20 MG tablet TAKE (1) TABLET DAILY AT BEDTIME. 30 tablet 11  . albuterol (PROVENTIL HFA;VENTOLIN HFA) 108 (90 Base) MCG/ACT inhaler Inhale 2 puffs into the lungs every 6 (six) hours as needed for wheezing or shortness of breath. 1 Inhaler 0  . benzonatate (TESSALON PERLES) 100 MG capsule Take 1 capsule (100 mg total) by mouth 3 (three) times daily as needed for cough. 20 capsule 0  . chlorpheniramine-HYDROcodone (TUSSIONEX PENNKINETIC ER) 10-8 MG/5ML SUER Take 5 mLs by mouth every 12 (twelve) hours as needed. 120 mL 0  . predniSONE (DELTASONE) 20 MG tablet Take 1 tablet (20 mg total) by mouth daily with breakfast. 10 tablet 0   No facility-administered medications prior to visit.      Past Medical History:  Diagnosis Date  . Allergy    seasonal  . Asystole ([place])    "vagal response"post nasal polypectomy  . Diabetes mellitus without complication ([place])    pre but take metformin  . Diverticulosis of colon   . Elevated glucose    fasting hyperglycemia  . FH: colonic polyps    hx of  . Hyperlipidemia   . Hypertension   . Mitral regurgitation    mild & aortic Valve calcification on 2d echo;sbe prophylaxis  . Obstructive sleep apnea    Dr Ardis Hughs  . Parotitis, acute 2013  . Seasonal rhinitis   . Sleep apnea    CPAP     Past Surgical History:  Procedure Laterality Date  . APPENDECTOMY    . asytole post nasal polypectomy  2008  . colonoscopy with polypectomy  2010   Dr Olevia Perches  . heriorraphy     x2  . HERNIA REPAIR    . MIDDLE EAR SURGERY     TM replacement  . NASAL SINUS SURGERY  1994, [id number]   X3  . TONSILLECTOMY AND ADENOIDECTOMY       Family History  Problem Relation Age of Onset  . Cancer Mother     liver &multiple myeloma  . Heart disease Mother     CABG, Angina  . Hyperlipidemia Mother   . Hypertension Father   .  Heart disease Father     MI in 47"s  . Dementia Father   . Cancer Maternal Grandfather     bone  . Other Sister     perforated bowel  . Other Sister     cns aneurysm  . Diabetes Neg Hx   . Stroke Neg Hx   . Colon cancer Neg Hx      Social History   Social  History  . Marital status: Married    Spouse name: N/A  . Number of children: 2  . Years of education: 35   Occupational History  . Retired Retired   Social History Main Topics  . Smoking status: Never Smoker  . Smokeless tobacco: Never Used  . Alcohol use 1.2 oz/week    1 Glasses of wine, 1 Shots of liquor per week     Comment: socially < 2/ week  . Drug use: No  . Sexual activity: Yes   Other Topics Concern  . 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   Regular exercise           Review of Systems  Constitutional: Denies fever, chills, fatigue, or significant weight gain/loss. HENT: Head: Denies headache or neck pain Ears: Denies changes in hearing, ringing in ears, earache, drainage Nose: Denies discharge, stuffiness, itching, nosebleed, sinus pain Throat: Denies sore throat, hoarseness, dry mouth, sores, thrush Eyes: Denies loss/changes in vision, pain, redness, blurry/double vision, flashing lights Cardiovascular: Denies chest pain/discomfort, tightness, palpitations, shortness of breath with activity, difficulty lying down, swelling, sudden awakening with shortness of breath Respiratory: Denies shortness of breath, cough, sputum production, wheezing Gastrointestinal: Denies dysphasia, heartburn, change in appetite, nausea, change in bowel habits, rectal bleeding, constipation, diarrhea, yellow skin or eyes Genitourinary: Denies frequency, urgency, burning/pain, blood in urine, incontinence, change in urinary strength. Musculoskeletal: Denies muscle/joint pain, stiffness, back pain, redness or swelling of joints,  trauma Skin: Denies rashes, lumps, itching, dryness, color changes, or hair/nail changes Neurological: Denies dizziness, fainting, seizures, weakness, numbness, tingling, tremor Psychiatric - Denies nervousness, stress, depression or memory loss Endocrine: Denies heat or cold intolerance, sweating, frequent urination, excessive thirst, changes in appetite Hematologic: Denies ease of bruising or bleeding    Objective:     BP 122/78 (BP Location: Left Arm, Patient Position: Sitting, Cuff Size: Large)   Pulse 65   Temp 98.1 F (36.7 C) (Oral)   Resp 16   Ht '5\' 10"'  (1.778 m)   Wt 222 lb (100.7 kg)   SpO2 98%   BMI 31.85 kg/m  Nursing note and vital signs reviewed.   Physical Exam  Constitutional: He is oriented to person, place, and time. He appears well-developed and well-nourished.  HENT:  Head: Normocephalic.  Right Ear: Hearing, tympanic membrane, external ear and ear canal normal.  Left Ear: Hearing, tympanic membrane, external ear and ear canal normal.  Nose: Nose normal.  Mouth/Throat: Uvula is midline, oropharynx is clear and moist and mucous membranes are normal.  Eyes: Conjunctivae and EOM are normal. Pupils are equal, round, and reactive to light.  Neck: Neck supple. No JVD present. No tracheal deviation present. No thyromegaly present.  Cardiovascular: Normal rate, regular rhythm, normal heart sounds and intact distal pulses.   Pulmonary/Chest: Effort normal and breath sounds normal.  Abdominal: Soft. Bowel sounds are normal. He exhibits no distension and no mass. There is no tenderness. There is no rebound and no guarding.  Musculoskeletal: Normal range of motion. He exhibits no edema or tenderness.  Lymphadenopathy:    He has no cervical adenopathy.  Neurological: He is alert and oriented to person, place, and time. He has normal reflexes. No cranial nerve deficit. He exhibits normal muscle tone. Coordination normal.  Skin: Skin is warm and dry.  Psychiatric: He has  a normal mood and affect. His behavior is normal.  Judgment and thought content normal.       Assessment & Plan:   During the course of the visit the patient was educated and counseled about appropriate screening and preventive services including:    Pneumococcal vaccine   Influenza vaccine  Td vaccine  Prostate cancer screening  Colorectal cancer screening  Diabetes screening  Nutrition counseling   Diet review for nutrition referral? Yes ____  Not Indicated _X___   Patient Instructions (the written plan) was given to the patient.  Medicare Attestation I have personally reviewed: The patient's medical and social history Their use of alcohol, tobacco or illicit drugs Their current medications and supplements The patient's functional ability including ADLs,fall risks, home safety risks, cognitive, and hearing and visual impairment Diet and physical activities Evidence for depression or mood disorders  The patient's weight, height, BMI,  have been recorded in the chart.  I have made referrals, counseling, and provided education to the patient based on review of the above and I have provided the patient with a written personalized care plan for preventive services.     Problem List Items Addressed This Visit      Cardiovascular and Mediastinum   Essential hypertension    Blood pressure well-controlled and below goal 140/90 with current regimen and no adverse side effects. Denies worst headache of life with no new symptoms of end organ damage noted on physical exam. Continue to monitor blood pressure at home and follow low-sodium diet. Continue current dosage of labetalol, spironolactone, and telmisartan.      Relevant Orders   CBC   Comprehensive metabolic panel     Respiratory   Obstructive sleep apnea    Stable and reports using the positive airway pressure nightly. Continue current positive airway pressure. Recommend weight loss to assist. Continue to monitor.         Endocrine   Uncontrolled diabetes mellitus (HCC)    Obtain A1c and urine microalbumin. Diabetic foot exam completed today. Eye exam is up to date. Maintained on telmisartan for CAD risk reduction. Pneumovax updated today. Continue current dosage of metformin pending A1c results. Continue monitor.      Relevant Orders   Hemoglobin A1c   Urine Microalbumin w/creat. ratio     Other   Hyperlipidemia    Appears stable with Vytorin with no adverse side effects. Obtain lipid profile. Continue current dosage of Vytorin pending lipid profile results.      Relevant Orders   Lipid panel   Medicare annual wellness visit, subsequent - Primary    Reviewed and updated patient's medical, surgical, family and social history. Medications and allergies were also reviewed. Basic screenings for depression, activities of daily living, hearing, cognition and safety were performed. Provider list was updated and health plan was provided to the patient.   Pneumovax updated today. Discussed plan for Zostavax. Declines tetanus. All other immunizations are up-to-date per recommendations. Obtain PSA for prostate cancer screening. Obtain hemoglobin A1c and urine microalbumin for diabetes screening. Diabetic foot exam completed today. All other screenings are up-to-date per recommendations.  Overall well exam with risk factors for cardiovascular disease including type 2 diabetes, hypertension, hyperlipidemia. Chronic conditions appear adequately managed through medication regimens and discussed under respective sections. Recommend weight loss of 5-10% of current body weight through nutrition and physical activity. His goal is to maintain his health. Continue healthy lifestyle behaviors and choices. Follow-up prevention exam in 1 year. Follow-up office visit for chronic conditions pending blood work.  Other Visit Diagnoses    Screening for prostate cancer       Relevant Orders   PSA, Medicare   Need for  23-polyvalent pneumococcal polysaccharide vaccine       Relevant Orders   Pneumococcal polysaccharide vaccine 23-valent greater than or equal to 2yo subcutaneous/IM (Completed)       I have discontinued Mr. Hallum benzonatate, predniSONE, albuterol, and chlorpheniramine-HYDROcodone. I am also having him maintain his aspirin, vitamin C, metFORMIN, VYTORIN, spironolactone, telmisartan, and labetalol.   Follow-up: Return in about 6 months (around 10/28/2016), or if symptoms worsen or fail to improve.   Mauricio Po, FNP

## 2016-05-01 ENCOUNTER — Other Ambulatory Visit: Payer: Self-pay | Admitting: Family

## 2016-05-01 MED ORDER — OMEGA-3-ACID ETHYL ESTERS 1 G PO CAPS: 2.0000 g | ORAL_CAPSULE | Freq: Two times a day (BID) | ORAL | 1 refills | Status: DC

## 2016-05-14 ENCOUNTER — Other Ambulatory Visit: Payer: Self-pay | Admitting: Family

## 2016-05-21 ENCOUNTER — Other Ambulatory Visit: Payer: Self-pay | Admitting: Family

## 2016-05-30 ENCOUNTER — Other Ambulatory Visit: Payer: Medicare Other

## 2016-05-30 ENCOUNTER — Other Ambulatory Visit: Payer: Self-pay | Admitting: Emergency Medicine

## 2016-05-30 ENCOUNTER — Other Ambulatory Visit (INDEPENDENT_AMBULATORY_CARE_PROVIDER_SITE_OTHER): Payer: Medicare Other

## 2016-05-30 DIAGNOSIS — E785 Hyperlipidemia, unspecified: Secondary | ICD-10-CM

## 2016-05-30 LAB — LIPID PANEL
CHOL/HDL RATIO: 4
Cholesterol: 139 mg/dL (ref 0–200)
HDL: 31.1 mg/dL — AB (ref >39.00)
NonHDL: 107.72
Triglycerides: 252 mg/dL — ABNORMAL HIGH (ref 0.0–149.0)
VLDL: 50.4 mg/dL — AB (ref 0.0–40.0)

## 2016-05-30 LAB — LDL CHOLESTEROL, DIRECT: LDL DIRECT: 70 mg/dL

## 2016-06-04 ENCOUNTER — Encounter: Payer: Self-pay | Admitting: Family

## 2016-06-04 ENCOUNTER — Other Ambulatory Visit: Payer: Self-pay | Admitting: Family

## 2016-06-04 DIAGNOSIS — E782 Mixed hyperlipidemia: Secondary | ICD-10-CM

## 2016-06-16 ENCOUNTER — Encounter: Payer: Self-pay | Admitting: Family

## 2016-06-25 ENCOUNTER — Other Ambulatory Visit: Payer: Self-pay | Admitting: Family

## 2016-07-15 ENCOUNTER — Encounter: Payer: Self-pay | Admitting: Cardiology

## 2016-07-25 ENCOUNTER — Other Ambulatory Visit: Payer: Self-pay | Admitting: Family

## 2016-07-29 NOTE — Progress Notes (Deleted)
Referring-[place], Ples Specter, FNP Reason for referral-Hyperlipidemia  HPI: 66 year old male for evaluation of hyperlipidemia at request of [place] Circle, FNP. Nuclear study 2008 showed ejection fraction 62% and no ischemia or infarction. Echocardiogram January 2010 showed normal LV function and mild mitral regurgitation. I saw this patient previously but not since 2008.  Current Outpatient Prescriptions  Medication Sig Dispense Refill  . Ascorbic Acid (VITAMIN C) 1000 MG tablet Take 1,000 mg by mouth daily.    Marland Kitchen aspirin (BAYER LOW STRENGTH) 81 MG EC tablet Take 81 mg by mouth daily.      Marland Kitchen labetalol (NORMODYNE) 300 MG tablet TAKE (1/2) TABLET TWICE DAILY. 90 tablet 0  . metFORMIN (GLUCOPHAGE) 500 MG tablet TAKE 1 TABLET TWICE DAILY WITH FOOD. 60 tablet 11  . omega-3 acid ethyl esters (LOVAZA) 1 g capsule Take 2 capsules (2 g total) by mouth 2 (two) times daily. 120 capsule 1  . spironolactone (ALDACTONE) 25 MG tablet TAKE 1 TABLET ONCE DAILY. 30 tablet 11  . telmisartan (MICARDIS) 80 MG tablet TAKE 1 TABLET ONCE DAILY. 90 tablet 3  . VYTORIN 10-20 MG tablet TAKE (1) TABLET DAILY AT BEDTIME. 30 tablet 11   No current facility-administered medications for this visit.     Allergies  Allergen Reactions  . Diltiazem Hcl     ? symptoms  . Penicillins     Rash with Amoxicillin with Mono in college    Past Medical History:  Diagnosis Date  . Allergy    seasonal  . Asystole ([place])    "vagal response"post nasal polypectomy  . Diabetes mellitus without complication ([place])    pre but take metformin  . Diverticulosis of colon   . Elevated glucose    fasting hyperglycemia  . FH: colonic polyps    hx of  . Hyperlipidemia   . Hypertension   . Mitral regurgitation    mild & aortic Valve calcification on 2d echo;sbe prophylaxis  . Obstructive sleep apnea    Dr Ardis Hughs  . Parotitis, acute 2013  . Seasonal rhinitis   . Sleep apnea    CPAP    Past Surgical History:  Procedure  Laterality Date  . APPENDECTOMY    . asytole post nasal polypectomy  2008  . colonoscopy with polypectomy  2010   Dr Olevia Perches  . heriorraphy     x2  . HERNIA REPAIR    . MIDDLE EAR SURGERY     TM replacement  . NASAL SINUS SURGERY  1994, [id number]   X3  . TONSILLECTOMY AND ADENOIDECTOMY      Social History   Social History  . Marital status: Married    Spouse name: N/A  . Number of children: 2  . Years of education: 83   Occupational History  . Retired Retired   Social History Main Topics  . Smoking status: Never Smoker  . Smokeless tobacco: Never Used  . Alcohol use 1.2 oz/week    1 Glasses of wine, 1 Shots of liquor per week     Comment: socially < 2/ week  . Drug use: No  . Sexual activity: Yes   Other Topics Concern  . 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   Regular exercise          Family History  Problem Relation Age of Onset  . Cancer Mother  liver &multiple myeloma  . Heart disease Mother     CABG, Angina  . Hyperlipidemia Mother   . Hypertension Father   . Heart disease Father     MI in 19"s  . Dementia Father   . Cancer Maternal Grandfather     bone  . Other Sister     perforated bowel  . Other Sister     cns aneurysm  . Diabetes Neg Hx   . Stroke Neg Hx   . Colon cancer Neg Hx     ROS: no fevers or chills, productive cough, hemoptysis, dysphasia, odynophagia, melena, hematochezia, dysuria, hematuria, rash, seizure activity, orthopnea, PND, pedal edema, claudication. Remaining systems are negative.  Physical Exam:   There were no vitals taken for this visit.  General:  Well developed/well nourished in NAD Skin warm/dry Patient not depressed No peripheral clubbing Back-normal HEENT-normal/normal eyelids Neck supple/normal carotid upstroke bilaterally; no bruits; no JVD; no thyromegaly chest - CTA/ normal expansion CV - RRR/normal S1  and S2; no murmurs, rubs or gallops;  PMI nondisplaced Abdomen -NT/ND, no HSM, no mass, + bowel sounds, no bruit 2+ femoral pulses, no bruits Ext-no edema, chords, 2+ DP Neuro-grossly nonfocal  ECG - personally reviewed  A/P  1  Kirk Ruths, MD

## 2016-08-04 ENCOUNTER — Ambulatory Visit: Payer: Medicare Other | Admitting: Cardiology

## 2016-08-04 ENCOUNTER — Encounter: Payer: Self-pay | Admitting: Cardiology

## 2016-08-04 ENCOUNTER — Ambulatory Visit (INDEPENDENT_AMBULATORY_CARE_PROVIDER_SITE_OTHER): Payer: Medicare Other | Admitting: Cardiology

## 2016-08-04 VITALS — BP 130/70 | HR 71 | Ht 70.0 in | Wt 228.0 lb

## 2016-08-04 DIAGNOSIS — I10 Essential (primary) hypertension: Secondary | ICD-10-CM

## 2016-08-04 DIAGNOSIS — E7800 Pure hypercholesterolemia, unspecified: Secondary | ICD-10-CM

## 2016-08-04 NOTE — Progress Notes (Signed)
Jonathon Armstrong, Jonathon Specter, FNP Reason for referral-Hyperlipidemia  HPI: 66 year old male for evaluation of hyperlipidemia at request of Mauricio Po, FNP. I saw him previously for bradycardia in 2008. Holter monitor showed sinus rhythm with brief PAT and PVC. Nuclear study showed ejection fraction 62% and no ischemia or infarction. Last echocardiogram 2010 showed normal LV function and mild mitral regurgitation. Most recent laboratories and March 2018 showed direct LDL 70. Total cholesterol 139 with triglycerides 252. HDL 31. Patient denies dyspnea, chest pain, palpitations or syncope. No orthopnea, PND or pedal edema.   Current Outpatient Prescriptions  Medication Sig Dispense Refill  . Ascorbic Acid (VITAMIN C) 1000 MG tablet Take 500 mg by mouth daily.     Marland Kitchen aspirin (BAYER LOW STRENGTH) 81 MG EC tablet Take 81 mg by mouth daily.      Marland Kitchen labetalol (NORMODYNE) 300 MG tablet TAKE (1/2) TABLET TWICE DAILY. 90 tablet 0  . metFORMIN (GLUCOPHAGE) 500 MG tablet TAKE 1 TABLET TWICE DAILY WITH FOOD. 60 tablet 11  . spironolactone (ALDACTONE) 25 MG tablet TAKE 1 TABLET ONCE DAILY. 30 tablet 11  . telmisartan (MICARDIS) 80 MG tablet TAKE 1 TABLET ONCE DAILY. 90 tablet 3  . VYTORIN 10-20 MG tablet TAKE (1) TABLET DAILY AT BEDTIME. 30 tablet 11   No current facility-administered medications for this visit.     Allergies  Allergen Reactions  . Diltiazem Hcl     ? symptoms  . Penicillins     Rash with Amoxicillin with Mono in college     Past Medical History:  Diagnosis Date  . Allergy    seasonal  . Asystole ([place])    "vagal response"post nasal polypectomy  . Diabetes mellitus without complication ([place])    pre but take metformin  . Diverticulosis of colon   . FH: colonic polyps    hx of  . Hyperlipidemia   . Hypertension   . Mitral regurgitation    mild & aortic Valve calcification on 2d echo;sbe prophylaxis  . Parotitis, acute 2013  . Seasonal rhinitis   . Sleep apnea    CPAP    Past Surgical History:  Procedure Laterality Date  . APPENDECTOMY    . asytole post nasal polypectomy  2008  . colonoscopy with polypectomy  2010   Dr Olevia Perches  . heriorraphy     x2  . HERNIA REPAIR    . MIDDLE EAR SURGERY     TM replacement  . NASAL SINUS SURGERY  1994, [id number]   X3  . TONSILLECTOMY AND ADENOIDECTOMY      Social History   Social History  . Marital status: Married    Spouse name: N/A  . Number of children: 2  . Years of education: 88   Occupational History  . Retired Retired   Social History Main Topics  . Smoking status: Never Smoker  . Smokeless tobacco: Never Used  . Alcohol use 1.2 oz/week    1 Glasses of wine, 1 Shots of liquor per week     Comment: socially < 2/ week  . Drug use: No  . Sexual activity: Yes   Other Topics Concern  . 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   Regular exercise          Family History  Problem Relation Age of Onset  . Cancer Mother  liver &multiple myeloma  . Heart disease Mother        CABG, Angina  . Hyperlipidemia Mother   . Hypertension Father   . Heart disease Father        MI in 43"s  . Dementia Father   . Cancer Maternal Grandfather        bone  . Other Sister        perforated bowel  . Other Sister        cns aneurysm  . Diabetes Neg Hx   . Stroke Neg Hx   . Colon cancer Neg Hx     ROS: no fevers or chills, productive cough, hemoptysis, dysphasia, odynophagia, melena, hematochezia, dysuria, hematuria, rash, seizure activity, orthopnea, PND, pedal edema, claudication. Remaining systems are negative.  Physical Exam:   Blood pressure 130/70, pulse 71, height _0  (1.778 m), weight 103.4 kg (228 lb).  General:  Well developed/well nourished in NAD Skin warm/dry Patient not depressed No peripheral clubbing Back-normal HEENT-normal/normal eyelids Neck supple/normal carotid  upstroke bilaterally; no bruits; no JVD; no thyromegaly chest - CTA/ normal expansion CV - RRR/normal S1 and S2; no murmurs, rubs or gallops;  PMI nondisplaced Abdomen -NT/ND, no HSM, no mass, + bowel sounds, no bruit 2+ femoral pulses, no bruits Ext-no edema, chords, 2+ DP Neuro-grossly nonfocal  ECG -  normal sinus rhythm, nonspecific ST changes. personally reviewed  A/P  1 Hyperlipidemia-I have reviewed the patient's laboratories. His LDL is 70. His triglycerides are mildly elevated and his HDL is mildly reduced. We will continue with Vytorin for now. I will have him seen in the lipid clinic and he may benefit from niacin or fibrate.   2 hypertension-blood pressure is controlled. Continue present medications.   3 diabetes mellitus-management per primary care.  4 History of bradycardia-occurred in the postoperative setting with no additional episodes. No further workup.   Kirk Ruths, MD

## 2016-08-04 NOTE — Patient Instructions (Signed)
Your physician wants you to follow-up in: [place] will receive a reminder letter in the mail two months in advance. If you don't receive a letter, please call our office to schedule the follow-up appointment.   Your physician recommends that you schedule a follow-up appointment TO [facility]

## 2016-08-21 ENCOUNTER — Other Ambulatory Visit: Payer: Self-pay | Admitting: Family

## 2016-08-27 NOTE — Progress Notes (Signed)
08/28/2016 GAL FELDHAUS 1951/02/04 [id number]   HPI:  Jonathon Armstrong is a 66 y.o. male patient of Dr Stanford Breed, who presents today for a lipid clinic evaluation.  His cardiac history is significant for bradycardia, mild mitral regurgitation, DM (last A1c 6.6), hypertension, sleep apnea and hyperlipidemia.  He has been working on dietary changes over the past year, gave up breads for Malena Edman and now french fries.  He notes a drop in his A1c over the past year when he gave up on desserts.    His PCP started him on Lovaza in February and after a month his TG dropped from 268 to 252.  He was told that it wasn't making much impact and it was discontinued.    Current Medications:  Vytorin 10/20 qd for 10+ years, Armstrong problems  Cholesterol Goals:   LDL < 100, TG < 150  Family history:   Father had MI at 10, died with Alzheimer's at 44  Mother at 39 multiple myeleoma  1 sister with brain aneurysm  Son with hypertension  Diet:   Eats most meals at home, when he does eat out he will usually choose fish.  Has a habit of giving up certain foods when his daughter or daughter in law are pregnant. He gave up on processed sweets and french fries, then found he did well without them and now only eats occasionally.  Does still eat some rice, occasional pasta and mostly chicken for protein.     Exercise:    Walking trail 2-3 times per week about 1 hour  Labs:  Results for HEATHER, STREEPER (MRN [id number]) as of 08/27/2016 19:33  Ref. Range 04/30/2016 10:55 05/30/2016  Total CHOL/HDL Ratio Unknown 4   Cholesterol Latest Ref Range: 0 - 200 mg/dL 127 139  HDL Cholesterol Latest Ref Range: >39.00 mg/dL 33.80 (L) 31.1  Direct LDL Latest Units: mg/dL 62.0   MICROALB/CREAT RATIO Latest Ref Range: 0.0 - 30.0 mg/g 1.0   NonHDL Unknown 93.55 107.72  Triglycerides Latest Ref Range: 0.0 - 149.0 mg/dL 268.0 (H) 252.0      Current Outpatient Prescriptions  Medication Sig Dispense Refill  . Ascorbic Acid (VITAMIN C)  1000 MG tablet Take 500 mg by mouth daily.     Marland Kitchen aspirin (BAYER LOW STRENGTH) 81 MG EC tablet Take 81 mg by mouth daily.      Marland Kitchen labetalol (NORMODYNE) 300 MG tablet TAKE (1/2) TABLET TWICE DAILY. 90 tablet 0  . metFORMIN (GLUCOPHAGE) 500 MG tablet TAKE 1 TABLET TWICE DAILY WITH FOOD. 180 tablet 0  . spironolactone (ALDACTONE) 25 MG tablet TAKE 1 TABLET ONCE DAILY. 30 tablet 11  . telmisartan (MICARDIS) 80 MG tablet TAKE 1 TABLET ONCE DAILY. 90 tablet 3  . VYTORIN 10-20 MG tablet TAKE (1) TABLET DAILY AT BEDTIME. 30 tablet 11   Armstrong current facility-administered medications for this visit.     Allergies  Allergen Reactions  . Diltiazem Hcl     ? symptoms  . Penicillins     Rash with Amoxicillin with Mono in college    Past Medical History:  Diagnosis Date  . Allergy    seasonal  . Asystole ([place])    "vagal response"post nasal polypectomy  . Diabetes mellitus without complication ([place])    pre but take metformin  . Diverticulosis of colon   . FH: colonic polyps    hx of  . Hyperlipidemia   . Hypertension   . Mitral regurgitation    mild & aortic  Valve calcification on 2d echo;sbe prophylaxis  . Parotitis, acute 2013  . Seasonal rhinitis   . Sleep apnea    CPAP    There were Armstrong vitals taken for this visit.   Hyperlipidemia Patient with elevated triglycerides currently on vytorin 10/20.  He is not interested in resuming prescription fish oil at this time, would instead prefer to work on some dietary changes.  He has managed to drop his A1c from 7.5 to 6.6 over the past year.  He will incorporate more cold water fish into his diet as well as continue to cut back on the white foods.  He was also encouraged to increase his exercise to daily unless the summer humidity gets to bad.  Advised that he call with further questions or concerns.     Tommy Medal PharmD CPP [facility]

## 2016-08-28 ENCOUNTER — Ambulatory Visit (INDEPENDENT_AMBULATORY_CARE_PROVIDER_SITE_OTHER): Payer: Medicare Other | Admitting: Pharmacist Clinician (PhC)/ Clinical Pharmacy Specialist

## 2016-08-28 DIAGNOSIS — E7800 Pure hypercholesterolemia, unspecified: Secondary | ICD-10-CM

## 2016-08-28 NOTE — Patient Instructions (Signed)

## 2016-08-28 NOTE — Assessment & Plan Note (Signed)
Patient with elevated triglycerides currently on vytorin 10/20.  He is not interested in resuming prescription fish oil at this time, would instead prefer to work on some dietary changes.  He has managed to drop his A1c from 7.5 to 6.6 over the past year.  He will incorporate more cold water fish into his diet as well as continue to cut back on the white foods.  He was also encouraged to increase his exercise to daily unless the summer humidity gets to bad.  Advised that he call with further questions or concerns.

## 2016-09-02 ENCOUNTER — Other Ambulatory Visit: Payer: Self-pay | Admitting: Family

## 2016-09-26 ENCOUNTER — Other Ambulatory Visit: Payer: Self-pay | Admitting: Family

## 2016-10-26 ENCOUNTER — Other Ambulatory Visit: Payer: Self-pay | Admitting: Family

## 2016-11-03 ENCOUNTER — Encounter: Payer: Self-pay | Admitting: Family

## 2016-11-21 ENCOUNTER — Other Ambulatory Visit: Payer: Self-pay | Admitting: Family

## 2016-12-03 ENCOUNTER — Telehealth: Payer: Self-pay | Admitting: Family

## 2016-12-03 NOTE — Telephone Encounter (Signed)
Pt called in and would like to know if you would be willing to take him on as a new pt

## 2016-12-04 NOTE — Telephone Encounter (Signed)
I can accept him.

## 2016-12-05 NOTE — Telephone Encounter (Signed)
Called pt left message for him to call back to sch transfer appt with Dr Quay Burow

## 2016-12-24 NOTE — Progress Notes (Signed)
Subjective:    Patient ID: Jonathon Armstrong, male    DOB: 09/23/1950, 66 y.o.   MRN: [id number]  HPI  He is here to establish with a new pcp.   The patient is here for follow up.  Hypertension: He is taking his medication daily. He is compliant with a low sodium diet.  He denies chest pain, palpitations, edema, shortness of breath and regular headaches. He is exercising regularly - walking three times a week - 2.5 miles.  He does not monitor his blood pressure at home.    Hyperlipidemia: He is taking his medication daily. He is compliant with a low fat/cholesterol diet. He is exercising regularly. He denies myalgias.   Diabetes: He is taking his medication daily as prescribed. He is compliant with a diabetic diet. He is exercising regularly.  He checks his feet daily and denies foot lesions. He is up-to-date with an ophthalmology examination.    Medications and allergies reviewed with patient and updated if appropriate.  Patient Active Problem List   Diagnosis Date Noted  . Cough 06/15/2015  . Diabetes ([place]) 02/25/2013  . Hyperlipidemia 12/05/2010  . NONSPECIFIC ABNORMAL ELECTROCARDIOGRAM 08/13/2009  . CARDIOMEGALY, MILD 03/23/2008  . HYPERURICEMIA, ASYMPTOMATIC 02/07/2008  . DIVERTICULOSIS, COLON 10/08/2007  . History of colonic polyps 10/08/2007  . Essential hypertension 03/26/2007  . ASYSTOLE 02/22/2007  . Obstructive sleep apnea 08/28/2006    Current Outpatient Prescriptions on File Prior to Visit  Medication Sig Dispense Refill  . Ascorbic Acid (VITAMIN C) 1000 MG tablet Take 500 mg by mouth daily.     Marland Kitchen aspirin (BAYER LOW STRENGTH) 81 MG EC tablet Take 81 mg by mouth daily.      Marland Kitchen ezetimibe-simvastatin (VYTORIN) 10-20 MG tablet TAKE (1) TABLET DAILY AT BEDTIME. 30 tablet 5  . labetalol (NORMODYNE) 300 MG tablet TAKE (1/2) TABLET TWICE DAILY. 90 tablet 0  . metFORMIN (GLUCOPHAGE) 500 MG tablet TAKE 1 TABLET TWICE DAILY WITH FOOD. 180 tablet 0  . spironolactone (ALDACTONE)  25 MG tablet TAKE 1 TABLET ONCE DAILY. 30 tablet 3  . telmisartan (MICARDIS) 80 MG tablet TAKE 1 TABLET ONCE DAILY. 90 tablet 3   No current facility-administered medications on file prior to visit.     Past Medical History:  Diagnosis Date  . Allergy    seasonal  . Asystole ([place])    "vagal response"post nasal polypectomy  . Diabetes mellitus without complication ([place])    pre but take metformin  . Diverticulosis of colon   . FH: colonic polyps    hx of  . Hyperlipidemia   . Hypertension   . Mitral regurgitation    mild & aortic Valve calcification on 2d echo;sbe prophylaxis  . Parotitis, acute 2013  . Seasonal rhinitis   . Sleep apnea    CPAP    Past Surgical History:  Procedure Laterality Date  . APPENDECTOMY    . asytole post nasal polypectomy  2008  . colonoscopy with polypectomy  2010   Dr Olevia Perches  . heriorraphy     x2  . HERNIA REPAIR    . MIDDLE EAR SURGERY     TM replacement  . NASAL SINUS SURGERY  1994, 8768,1157   X3  . TONSILLECTOMY AND ADENOIDECTOMY      Social History   Social History  . Marital status: Married    Spouse name: N/A  . Number of children: 2  . Years of education: 23   Occupational History  . Retired Retired  Social History Main Topics  . Smoking status: Never Smoker  . Smokeless tobacco: Never Used  . Alcohol use 1.2 oz/week    1 Glasses of wine, 1 Shots of liquor per week     Comment: socially < 2/ week  . Drug use: No  . Sexual activity: Yes   Other Topics Concern  . None   Social History Narrative   Fun: Water, swimming, [employer]; Psychologist, occupational work    Denies abuse and feels safe at home.   Regular exercise          Family History  Problem Relation Age of Onset  . Cancer Mother        liver &multiple myeloma  . Heart disease Mother        CABG, Angina  . Hyperlipidemia Mother   . Hypertension Father   . Heart disease Father        MI in 25"s  . Dementia Father   . Cancer Maternal Grandfather        bone    . Other Sister        perforated bowel  . Other Sister        cns aneurysm  . Diabetes Neg Hx   . Stroke Neg Hx   . Colon cancer Neg Hx     Review of Systems  Constitutional: Negative for chills and fever.  Respiratory: Negative for cough, shortness of breath and wheezing.   Cardiovascular: Negative for chest pain, palpitations and leg swelling.  Neurological: Negative for light-headedness and headaches.       Objective:   Vitals:   12/25/16 0910  BP: 124/80  Pulse: 69  Resp: 16  Temp: 98.8 F (37.1 C)  SpO2: 98%   Wt Readings from Last 3 Encounters:  12/25/16 231 lb (104.8 kg)  08/04/16 228 lb (103.4 kg)  04/30/16 222 lb (100.7 kg)   Body mass index is 33.15 kg/m.   Physical Exam    Constitutional: Appears well-developed and well-nourished. No distress.  HENT:  Head: Normocephalic and atraumatic.  Neck: Neck supple. No tracheal deviation present. No thyromegaly present.  No cervical lymphadenopathy Cardiovascular: Normal rate, regular rhythm and normal heart sounds.   No murmur heard. No carotid bruit .  No edema Pulmonary/Chest: Effort normal and breath sounds normal. No respiratory distress. No has no wheezes. No rales.  Skin: Skin is warm and dry. Not diaphoretic.  Psychiatric: Normal mood and affect. Behavior is normal.      Assessment & Plan:    See Problem List for Assessment and Plan of chronic medical problems.

## 2016-12-25 ENCOUNTER — Ambulatory Visit (INDEPENDENT_AMBULATORY_CARE_PROVIDER_SITE_OTHER): Payer: Medicare Other | Admitting: Internal Medicine

## 2016-12-25 ENCOUNTER — Other Ambulatory Visit (INDEPENDENT_AMBULATORY_CARE_PROVIDER_SITE_OTHER): Payer: Medicare Other

## 2016-12-25 ENCOUNTER — Encounter: Payer: Self-pay | Admitting: Internal Medicine

## 2016-12-25 VITALS — BP 124/80 | HR 69 | Temp 98.8°F | Resp 16 | Ht 70.0 in | Wt 231.0 lb

## 2016-12-25 DIAGNOSIS — I10 Essential (primary) hypertension: Secondary | ICD-10-CM

## 2016-12-25 DIAGNOSIS — Z23 Encounter for immunization: Secondary | ICD-10-CM | POA: Diagnosis not present

## 2016-12-25 DIAGNOSIS — E7800 Pure hypercholesterolemia, unspecified: Secondary | ICD-10-CM | POA: Diagnosis not present

## 2016-12-25 DIAGNOSIS — E119 Type 2 diabetes mellitus without complications: Secondary | ICD-10-CM

## 2016-12-25 LAB — COMPREHENSIVE METABOLIC PANEL
ALK PHOS: 67 U/L (ref 39–117)
ALT: 38 U/L (ref 0–53)
AST: 31 U/L (ref 0–37)
Albumin: 4.6 g/dL (ref 3.5–5.2)
BILIRUBIN TOTAL: 0.4 mg/dL (ref 0.2–1.2)
BUN: 15 mg/dL (ref 6–23)
CO2: 23 mEq/L (ref 19–32)
Calcium: 9.8 mg/dL (ref 8.4–10.5)
Chloride: 104 mEq/L (ref 96–112)
Creatinine, Ser: 1.13 mg/dL (ref 0.40–1.50)
GFR: 68.71 mL/min (ref >60.00)
GLUCOSE: 195 mg/dL — AB (ref 70–99)
Potassium: 4.1 mEq/L (ref 3.5–5.1)
SODIUM: 137 meq/L (ref 135–145)
TOTAL PROTEIN: 7.1 g/dL (ref 6.0–8.3)

## 2016-12-25 LAB — LIPID PANEL
Cholesterol: 121 mg/dL (ref 0–200)
HDL: 33.5 mg/dL — AB (ref >39.00)
NONHDL: 87.48
Total CHOL/HDL Ratio: 4
Triglycerides: 224 mg/dL — ABNORMAL HIGH (ref 0.0–149.0)
VLDL: 44.8 mg/dL — ABNORMAL HIGH (ref 0.0–40.0)

## 2016-12-25 LAB — HEMOGLOBIN A1C: Hgb A1c MFr Bld: 7 % — ABNORMAL HIGH (ref 4.6–6.5)

## 2016-12-25 LAB — LDL CHOLESTEROL, DIRECT: LDL DIRECT: 66 mg/dL

## 2016-12-25 NOTE — Patient Instructions (Addendum)
  Test(s) ordered today. Your results will be released to [place] (or called to you) after review, usually within 72hours after test completion. If any changes need to be made, you will be notified at that same time.  All other Health Maintenance issues reviewed.   All recommended immunizations and age-appropriate screenings are up-to-date or discussed.  Flu immunization administered today.   Medications reviewed and updated.  No changes recommended at this time.   Please followup in 6 months

## 2016-12-25 NOTE — Assessment & Plan Note (Signed)
Following with cardiology - elevated trigs -- they advised restarting prescription fish oil, but he wanted to work on lifestyle first Check lipids today Taking vytorin

## 2016-12-25 NOTE — Assessment & Plan Note (Signed)
BP Readings from Last 3 Encounters:  12/25/16 124/80  08/04/16 130/70  04/30/16 122/78    BP well controlled Current regimen effective and well tolerated Continue current medications at current doses cmp

## 2016-12-25 NOTE — Assessment & Plan Note (Signed)
Check a1c Continue regular exercise - walks three times a week Would benefit from weight loss Compliant with a diabetic diet Continue metformin - will adjust dose if needed

## 2016-12-26 ENCOUNTER — Encounter: Payer: Self-pay | Admitting: Internal Medicine

## 2017-01-28 ENCOUNTER — Other Ambulatory Visit: Payer: Self-pay | Admitting: Emergency Medicine

## 2017-01-28 MED ORDER — LABETALOL HCL 300 MG PO TABS: ORAL_TABLET | ORAL | 1 refills | Status: DC

## 2017-01-30 ENCOUNTER — Telehealth: Payer: Self-pay | Admitting: Internal Medicine

## 2017-01-30 MED ORDER — SPIRONOLACTONE 25 MG PO TABS: 25.0000 mg | ORAL_TABLET | Freq: Every day | ORAL | 0 refills | Status: DC

## 2017-01-30 NOTE — Telephone Encounter (Signed)
erx sent for 30 days

## 2017-01-30 NOTE — Telephone Encounter (Signed)
Pt called in and need refill of spironolactone (ALDACTONE) 25 MG tablet [[id number]]  Asap, pt is completely out.  Said that pharmacy faxed this over the end of last week? I didn't see it

## 2017-02-02 ENCOUNTER — Other Ambulatory Visit: Payer: Self-pay | Admitting: Emergency Medicine

## 2017-02-02 MED ORDER — SPIRONOLACTONE 25 MG PO TABS: 25.0000 mg | ORAL_TABLET | Freq: Every day | ORAL | 1 refills | Status: DC

## 2017-02-26 ENCOUNTER — Other Ambulatory Visit: Payer: Self-pay | Admitting: Internal Medicine

## 2017-02-26 ENCOUNTER — Other Ambulatory Visit: Payer: Self-pay | Admitting: Family

## 2017-03-11 ENCOUNTER — Telehealth: Payer: Self-pay | Admitting: Family

## 2017-03-11 ENCOUNTER — Other Ambulatory Visit: Payer: Self-pay | Admitting: Internal Medicine

## 2017-03-11 ENCOUNTER — Telehealth: Payer: Self-pay | Admitting: Internal Medicine

## 2017-03-11 MED ORDER — EZETIMIBE-SIMVASTATIN 10-20 MG PO TABS: ORAL_TABLET | ORAL | 5 refills | Status: DC

## 2017-03-11 NOTE — Telephone Encounter (Signed)
Copied from [place]. Topic: Quick Communication - Rx Refill/Question >> Mar 11, 2017 12:46 PM Scherrie Gerlach wrote: Has the patient contacted their pharmacy? Yes  Pt now sees Dr Quay Burow and his  ezetimibe-simvastatin (VYTORIN) 10-20 MG tablet  Was denied.  Sent to [facility]. Can you refill this med Dr Quay Burow? Pt last seen 12/2016.  [place], [place] [phone] (Phone) [phone] (Fax)

## 2017-03-11 NOTE — Telephone Encounter (Signed)
Pt now sees Dr Quay Burow and his  ezetimibe-simvastatin (VYTORIN) 10-20 MG tablet  Was denied.  Sent to [facility]. Can you refill this med Dr Quay Burow? Pt last seen 12/2016.  [place], [place] [phone] (Phone) [phone] (Fax)

## 2017-03-11 NOTE — Telephone Encounter (Signed)
Reviewed chart pt is up-to-date sent refills to [facility].../lmb  

## 2017-03-24 DIAGNOSIS — J45909 Unspecified asthma, uncomplicated: Secondary | ICD-10-CM

## 2017-03-24 HISTORY — DX: Unspecified asthma, uncomplicated: J45.909

## 2017-05-18 ENCOUNTER — Other Ambulatory Visit: Payer: Self-pay | Admitting: Internal Medicine

## 2017-07-09 ENCOUNTER — Other Ambulatory Visit: Payer: Self-pay | Admitting: Emergency Medicine

## 2017-07-09 MED ORDER — EZETIMIBE-SIMVASTATIN 10-20 MG PO TABS: ORAL_TABLET | ORAL | 1 refills | Status: DC

## 2017-07-14 ENCOUNTER — Ambulatory Visit: Payer: Medicare Other | Admitting: Internal Medicine

## 2017-07-23 NOTE — Progress Notes (Signed)
Subjective:    Patient ID: Jonathon Armstrong, male    DOB: 14-Sep-1950, 67 y.o.   MRN: [id number]  HPI The patient is here for follow up.  Diabetes: He is taking his medication daily as prescribed. He is compliant with a diabetic diet. He is exercising regularly. He checks his feet daily and denies foot lesions. He is not up-to-date with an ophthalmology examination.   Hypertension: He is taking his medication daily. He is compliant with a low sodium diet.  He denies chest pain, palpitations, edema, shortness of breath and regular headaches. He is exercising regularly - walks 2.5 miles three times a week.  He does not monitor his blood pressure at home.    Hyperlipidemia: He is taking his medication daily. He is compliant with a low fat/cholesterol diet. He is exercising regularly. He denies myalgias.    Medications and allergies reviewed with patient and updated if appropriate.  Patient Active Problem List   Diagnosis Date Noted  . Diabetes ([place]) 02/25/2013  . Hyperlipidemia 12/05/2010  . NONSPECIFIC ABNORMAL ELECTROCARDIOGRAM 08/13/2009  . HYPERURICEMIA, ASYMPTOMATIC 02/07/2008  . DIVERTICULOSIS, COLON 10/08/2007  . History of colonic polyps 10/08/2007  . Essential hypertension 03/26/2007  . ASYSTOLE 02/22/2007  . Obstructive sleep apnea 08/28/2006    Current Outpatient Medications on File Prior to Visit  Medication Sig Dispense Refill  . Ascorbic Acid (VITAMIN C) 1000 MG tablet Take 500 mg by mouth daily.     Marland Kitchen aspirin (BAYER LOW STRENGTH) 81 MG EC tablet Take 81 mg by mouth daily.      Marland Kitchen ezetimibe-simvastatin (VYTORIN) 10-20 MG tablet TAKE (1) TABLET DAILY AT BEDTIME. 90 tablet 1  . labetalol (NORMODYNE) 300 MG tablet TAKE (1/2) TABLET TWICE DAILY. 90 tablet 1  . metFORMIN (GLUCOPHAGE) 500 MG tablet TAKE 1 TABLET TWICE DAILY WITH FOOD. 180 tablet 1  . spironolactone (ALDACTONE) 25 MG tablet Take 1 tablet (25 mg total) daily by mouth. 90 tablet 1  . telmisartan (MICARDIS) 80  MG tablet TAKE 1 TABLET ONCE DAILY. 90 tablet 0   No current facility-administered medications on file prior to visit.     Past Medical History:  Diagnosis Date  . Allergy    seasonal  . Asystole ([place])    "vagal response"post nasal polypectomy  . Diabetes mellitus without complication ([place])    pre but take metformin  . Diverticulosis of colon   . FH: colonic polyps    hx of  . Hyperlipidemia   . Hypertension   . Mitral regurgitation    mild & aortic Valve calcification on 2d echo;sbe prophylaxis  . Parotitis, acute 2013  . Seasonal rhinitis   . Sleep apnea    CPAP    Past Surgical History:  Procedure Laterality Date  . APPENDECTOMY    . asytole post nasal polypectomy  2008  . colonoscopy with polypectomy  2010   Dr Olevia Perches  . heriorraphy     x2  . HERNIA REPAIR    . MIDDLE EAR SURGERY     TM replacement  . NASAL SINUS SURGERY  1994, 5361,4431   X3  . TONSILLECTOMY AND ADENOIDECTOMY      Social History   Socioeconomic History  . Marital status: Married    Spouse name: Not on file  . Number of children: 2  . Years of education: 30  . Highest education level: Not on file  Occupational History  . Occupation: Retired    Fish farm manager: RETIRED  Social Needs  .  Financial resource strain: Not on file  . Food insecurity:    Worry: Not on file    Inability: Not on file  . Transportation needs:    Medical: Not on file    Non-medical: Not on file  Tobacco Use  . Smoking status: Never Smoker  . Smokeless tobacco: Never Used  Substance and Sexual Activity  . Alcohol use: Yes    Alcohol/week: 1.2 oz    Types: 1 Glasses of wine, 1 Shots of liquor per week    Comment: socially < 2/ week  . Drug use: No  . Sexual activity: Yes  Lifestyle  . Physical activity:    Days per week: Not on file    Minutes per session: Not on file  . Stress: Not on file  Relationships  . Social connections:    Talks on phone: Not on file    Gets together: Not on file    Attends  religious service: Not on file    Active member of club or organization: Not on file    Attends meetings of clubs or organizations: Not on file    Relationship status: Not on file  Other Topics Concern  . Not on file  Social History Narrative   Fun: Water, swimming, [employer]; Psychologist, occupational work    Denies abuse and feels safe at home.   Regular exercise       Family History  Problem Relation Age of Onset  . Cancer Mother        liver &multiple myeloma  . Heart disease Mother        CABG, Angina  . Hyperlipidemia Mother   . Hypertension Father   . Heart disease Father        MI in 47"s  . Dementia Father   . Cancer Maternal Grandfather        bone  . Other Sister        perforated bowel  . Other Sister        cns aneurysm  . Diabetes Neg Hx   . Stroke Neg Hx   . Colon cancer Neg Hx     Review of Systems  Constitutional: Negative for chills and fever.  Respiratory: Negative for cough, shortness of breath and wheezing.   Cardiovascular: Negative for chest pain, palpitations and leg swelling.  Neurological: Negative for light-headedness and headaches.       Objective:   Vitals:   07/24/17 0823  BP: 116/68  Pulse: 72  Resp: 16  Temp: 98.3 F (36.8 C)  SpO2: 97%   BP Readings from Last 3 Encounters:  07/24/17 116/68  12/25/16 124/80  08/04/16 130/70   Wt Readings from Last 3 Encounters:  07/24/17 227 lb (103 kg)  12/25/16 231 lb (104.8 kg)  08/04/16 228 lb (103.4 kg)   Body mass index is 32.57 kg/m.   Physical Exam    Constitutional: Appears well-developed and well-nourished. No distress.  HENT:  Head: Normocephalic and atraumatic.  Neck: Neck supple. No tracheal deviation present. No thyromegaly present.  No cervical lymphadenopathy Cardiovascular: Normal rate, regular rhythm and normal heart sounds.   No murmur heard. No carotid bruit .  No edema Pulmonary/Chest: Effort normal and breath sounds normal. No respiratory distress. No has no wheezes. No  rales.  Skin: Skin is warm and dry. Not diaphoretic.  Psychiatric: Normal mood and affect. Behavior is normal.      Assessment & Plan:    See Problem List for Assessment and Plan  of chronic medical problems.

## 2017-07-23 NOTE — Patient Instructions (Addendum)

## 2017-07-24 ENCOUNTER — Other Ambulatory Visit (INDEPENDENT_AMBULATORY_CARE_PROVIDER_SITE_OTHER): Payer: Medicare Other

## 2017-07-24 ENCOUNTER — Ambulatory Visit (INDEPENDENT_AMBULATORY_CARE_PROVIDER_SITE_OTHER): Payer: Medicare Other | Admitting: Internal Medicine

## 2017-07-24 ENCOUNTER — Encounter: Payer: Self-pay | Admitting: Internal Medicine

## 2017-07-24 VITALS — BP 116/68 | HR 72 | Temp 98.3°F | Resp 16 | Wt 227.0 lb

## 2017-07-24 DIAGNOSIS — I10 Essential (primary) hypertension: Secondary | ICD-10-CM | POA: Diagnosis not present

## 2017-07-24 DIAGNOSIS — Z125 Encounter for screening for malignant neoplasm of prostate: Secondary | ICD-10-CM

## 2017-07-24 DIAGNOSIS — E782 Mixed hyperlipidemia: Secondary | ICD-10-CM

## 2017-07-24 DIAGNOSIS — E119 Type 2 diabetes mellitus without complications: Secondary | ICD-10-CM

## 2017-07-24 LAB — LIPID PANEL
CHOLESTEROL: 117 mg/dL (ref 0–200)
HDL: 32 mg/dL — AB (ref >39.00)
NonHDL: 85.12
TRIGLYCERIDES: 225 mg/dL — AB (ref 0.0–149.0)
Total CHOL/HDL Ratio: 4
VLDL: 45 mg/dL — ABNORMAL HIGH (ref 0.0–40.0)

## 2017-07-24 LAB — COMPREHENSIVE METABOLIC PANEL
ALBUMIN: 4.6 g/dL (ref 3.5–5.2)
ALK PHOS: 69 U/L (ref 39–117)
ALT: 31 U/L (ref 0–53)
AST: 28 U/L (ref 0–37)
BUN: 21 mg/dL (ref 6–23)
CALCIUM: 10.1 mg/dL (ref 8.4–10.5)
CO2: 25 mEq/L (ref 19–32)
Chloride: 103 mEq/L (ref 96–112)
Creatinine, Ser: 1.27 mg/dL (ref 0.40–1.50)
GFR: 59.94 mL/min — AB (ref >60.00)
Glucose, Bld: 156 mg/dL — ABNORMAL HIGH (ref 70–99)
POTASSIUM: 4.6 meq/L (ref 3.5–5.1)
Sodium: 139 mEq/L (ref 135–145)
TOTAL PROTEIN: 6.9 g/dL (ref 6.0–8.3)
Total Bilirubin: 0.6 mg/dL (ref 0.2–1.2)

## 2017-07-24 LAB — LDL CHOLESTEROL, DIRECT: LDL DIRECT: 62 mg/dL

## 2017-07-24 LAB — CBC WITH DIFFERENTIAL/PLATELET
BASOS PCT: 0.4 % (ref 0.0–3.0)
Basophils Absolute: 0 10*3/uL (ref 0.0–0.1)
EOS PCT: 1.2 % (ref 0.0–5.0)
Eosinophils Absolute: 0.1 10*3/uL (ref 0.0–0.7)
HCT: 40.8 % (ref 39.0–52.0)
HEMOGLOBIN: 14.2 g/dL (ref 13.0–17.0)
Lymphocytes Relative: 21.6 % (ref 12.0–46.0)
Lymphs Abs: 1.8 10*3/uL (ref 0.7–4.0)
MCHC: 34.7 g/dL (ref 30.0–36.0)
MCV: 88.3 fl (ref 78.0–100.0)
MONOS PCT: 6.6 % (ref 3.0–12.0)
Monocytes Absolute: 0.6 10*3/uL (ref 0.1–1.0)
Neutro Abs: 5.9 10*3/uL (ref 1.4–7.7)
Neutrophils Relative %: 70.2 % (ref 43.0–77.0)
Platelets: 192 10*3/uL (ref 150.0–400.0)
RBC: 4.62 Mil/uL (ref 4.22–5.81)
RDW: 13.4 % (ref 11.5–15.5)
WBC: 8.4 10*3/uL (ref 4.0–10.5)

## 2017-07-24 LAB — HEMOGLOBIN A1C: HEMOGLOBIN A1C: 7.6 % — AB (ref 4.6–6.5)

## 2017-07-24 LAB — PSA, MEDICARE: PSA: 2.72 ng/ml (ref 0.10–4.00)

## 2017-07-24 LAB — TSH: TSH: 1.42 u[IU]/mL (ref 0.35–4.50)

## 2017-07-24 NOTE — Assessment & Plan Note (Addendum)
BP well controlled Current regimen effective and well tolerated Continue current medications at current doses Cmp, cbc, tsh

## 2017-07-24 NOTE — Assessment & Plan Note (Signed)
Check lipid panel  Continue daily statin Regular exercise and healthy diet encouraged  

## 2017-07-24 NOTE — Assessment & Plan Note (Signed)
Check a1c Low sugar / carb diet Stressed regular exercise, weight loss  

## 2017-08-04 ENCOUNTER — Other Ambulatory Visit: Payer: Self-pay | Admitting: Internal Medicine

## 2017-08-11 ENCOUNTER — Other Ambulatory Visit: Payer: Self-pay | Admitting: Internal Medicine

## 2017-08-11 NOTE — Telephone Encounter (Signed)
Copied from [place] [id number]. Topic: Quick Communication - Rx Refill/Question >> Aug 11, 2017 11:24 AM Robina Ade, Helene Kelp D wrote: Medication: metFORMIN (GLUCOPHAGE) 500 MG tablet, increase to take 4X a day, 2 pills twice a day.  Has the patient contacted their pharmacy? Yes (Agent: If no, request that the patient contact the pharmacy for the refill.) (Agent: If yes, when and what did the pharmacy advise?)  Preferred Pharmacy (with phone number or street name): [facility], [place].  Agent: Please be advised that RX refills may take up to 3 business days. We ask that you follow-up with your pharmacy.

## 2017-08-12 MED ORDER — METFORMIN HCL 500 MG PO TABS: 500.0000 mg | ORAL_TABLET | Freq: Two times a day (BID) | ORAL | 1 refills | Status: DC

## 2017-08-12 MED ORDER — METFORMIN HCL 500 MG PO TABS: 1000.0000 mg | ORAL_TABLET | Freq: Two times a day (BID) | ORAL | 1 refills | Status: DC

## 2017-08-12 NOTE — Telephone Encounter (Signed)
Reviewed chart MD increase metformin to 2 tablets twice a day. Updated med list and sent new rx to [facility].Marland KitchenJohny Chess

## 2017-08-12 NOTE — Telephone Encounter (Signed)
Rx refill request: Please correct Sig and Number to reflect new dosing per lab note: 07/24/17 so patient Rx will be correct. Metformin 500 mg     Last filled: 02/26/17 # 180  LOV: 07/24/17  PCP: Quay Burow  Pharmacy: verified

## 2017-08-20 ENCOUNTER — Other Ambulatory Visit: Payer: Self-pay | Admitting: Internal Medicine

## 2017-08-28 ENCOUNTER — Other Ambulatory Visit: Payer: Self-pay | Admitting: Internal Medicine

## 2017-09-15 DIAGNOSIS — L570 Actinic keratosis: Secondary | ICD-10-CM | POA: Diagnosis not present

## 2017-09-15 DIAGNOSIS — L821 Other seborrheic keratosis: Secondary | ICD-10-CM | POA: Diagnosis not present

## 2017-09-15 DIAGNOSIS — D1801 Hemangioma of skin and subcutaneous tissue: Secondary | ICD-10-CM | POA: Diagnosis not present

## 2017-09-15 DIAGNOSIS — L720 Epidermal cyst: Secondary | ICD-10-CM | POA: Diagnosis not present

## 2017-10-07 ENCOUNTER — Telehealth: Payer: Self-pay | Admitting: Internal Medicine

## 2017-10-07 DIAGNOSIS — G4733 Obstructive sleep apnea (adult) (pediatric): Secondary | ICD-10-CM

## 2017-10-07 NOTE — Telephone Encounter (Signed)
Copied from [place] [id number]. Topic: Inquiry >> Oct 07, 2017  2:23 PM Burchel, Abbi R wrote: Reason for CRM:   Pt is using a 68 year-old CPAP machine that needs to be replaced.  He also needs a travel unit.  Pt needs a physical rx for this.  Pt states he will pick up whenever it is ready. Please call pt at: [phone]

## 2017-10-07 NOTE — Telephone Encounter (Signed)
Will this require a pulmonary referal?

## 2017-10-08 NOTE — Telephone Encounter (Signed)
I think he will probably need to see pulm - he may need his old sleep study results and may need further testing done.   See if he is ok with seeing pulm

## 2017-10-08 NOTE — Telephone Encounter (Signed)
Spoke with pt, he is okay with have referral sent to pulmonary.

## 2017-10-09 NOTE — Telephone Encounter (Signed)
Referral ordered

## 2017-10-22 ENCOUNTER — Encounter: Payer: Self-pay | Admitting: Internal Medicine

## 2017-11-10 DIAGNOSIS — H5213 Myopia, bilateral: Secondary | ICD-10-CM | POA: Diagnosis not present

## 2017-11-10 DIAGNOSIS — H43813 Vitreous degeneration, bilateral: Secondary | ICD-10-CM | POA: Diagnosis not present

## 2017-11-10 DIAGNOSIS — H524 Presbyopia: Secondary | ICD-10-CM | POA: Diagnosis not present

## 2017-11-10 DIAGNOSIS — H52223 Regular astigmatism, bilateral: Secondary | ICD-10-CM | POA: Diagnosis not present

## 2017-11-10 DIAGNOSIS — E119 Type 2 diabetes mellitus without complications: Secondary | ICD-10-CM | POA: Diagnosis not present

## 2017-11-10 DIAGNOSIS — H2513 Age-related nuclear cataract, bilateral: Secondary | ICD-10-CM | POA: Diagnosis not present

## 2017-11-10 LAB — HM DIABETES EYE EXAM

## 2017-11-16 ENCOUNTER — Encounter: Payer: Self-pay | Admitting: Pulmonary Disease

## 2017-11-16 ENCOUNTER — Ambulatory Visit (INDEPENDENT_AMBULATORY_CARE_PROVIDER_SITE_OTHER): Payer: Medicare Other | Admitting: Pulmonary Disease

## 2017-11-16 VITALS — BP 140/82 | HR 86 | Ht 70.5 in | Wt 225.4 lb

## 2017-11-16 DIAGNOSIS — G4733 Obstructive sleep apnea (adult) (pediatric): Secondary | ICD-10-CM | POA: Diagnosis not present

## 2017-11-16 NOTE — Progress Notes (Signed)
[facility]  Chief Complaint  Patient presents with  . sleep consult    per Dr. Quay Burow, was wanting travel CPAP    Constitutional: BP 140/82 (BP Location: Left Arm, Patient Position: Sitting, Cuff Size: Normal)   Pulse 86   Ht 5' 10.5" (1.791 m)   Wt 225 lb 6.4 oz (102.2 kg)   SpO2 98%   BMI 31.88 kg/m   History of Present Illness: Jonathon Armstrong is a 67 y.o. male with obstructive sleep apnea.  He had a sleep study in the early 2000's at [facility] and started on CPAP.  He has same machine since original set up.  He gets his replacement parts on his own from the internet and doesn't have a DME.  He is planning a trip to [place] and wanted to look into the option of a travel machine.  He was told he needed a script and checked with his PCP who then advised he should have sleep assessment first.  He goes to sleep at 10 pm.  He falls asleep immediately.  He wakes up 2 times to use the bathroom.  He gets out of bed at 5 am.  He feels rested in the morning.  He denies morning headache.  He does not use anything to help him fall sleep or stay awake.  If he doesn't wear CPAP, then he will snore and have trouble with his breathing.  He denies sleep walking, sleep talking, bruxism, or nightmares.  There is no history of restless legs.  He denies sleep hallucinations, sleep paralysis, or cataplexy.  The Epworth score is 1 out of 24.   Comprehensive Respiratory Exam:  Appearance - well kempt  ENMT - nasal mucosa moist, turbinates clear, midline nasal septum, no dental lesions, no gingival bleeding, no oral exudates, no tonsillar hypertrophy, MP 3, enlarged tongue with scalloped border Neck - no masses, trachea midline, no thyromegaly, no elevation in JVP Respiratory - normal appearance of chest wall, normal respiratory effort w/o accessory muscle use, no dullness on percussion, no tactile fremitus, no wheezing or rales CV - s1s2 regular rate and rhythm, no  murmurs, no peripheral edema, no varicosities, radial pulses symmetric GI - soft, non tender, no masses, no hepatosplenomegaly Lymph - no adenopathy noted in neck and axillary areas MSK - normal muscle strength and tone, normal gait Ext - no cyanosis, clubbing, or joint inflammation noted Skin - no rashes, lesions, or ulcers Neuro - oriented to person, place, and time Psych - normal mood and affect  Assessment/Plan:  Obstructive sleep apnea. - will arrange for home sleep study to assess current status of sleep apnea - assuming he still has sleep apnea, then will arrange for new CPAP machine through a DME - explained that he would need to pay for travel machine on his own >> he doesn't see the need for this anymore, and will defer this  Obesity. - discussed how weight can impact sleep and risk for sleep disordered breathing - discussed options to assist with weight loss: combination of diet modification, cardiovascular and strength training exercises  Cardiovascular risk. - had an extensive discussion regarding the adverse health consequences related to untreated sleep disordered breathing - specifically discussed the risks for hypertension, coronary artery disease, cardiac dysrhythmias, cerebrovascular disease, and diabetes - lifestyle modification discussed  Safe driving practices. - discussed how sleep disruption can increase risk of accidents, particularly when driving - safe driving practices were discussed  Therapies for obstructive sleep  apnea. - if the sleep study shows significant sleep apnea, then various therapies for treatment were reviewed: CPAP, oral appliance, and surgical interventions    Patient Instructions  Will arrange for home sleep study Will call to arrange for follow up after sleep study reviewed     Chesley Mires, MD [place] 11/16/2017, 11:43 AM  Flow Sheet  Sleep tests:  Review of Systems: All negative except in  HPI.  Past Medical History: He  has a past medical history of Allergy, Asystole ([place]), Diabetes mellitus without complication ([place]), Diverticulosis of colon, FH: colonic polyps, Hyperlipidemia, Hypertension, Mitral regurgitation, Parotitis, acute (2013), Seasonal rhinitis, and Sleep apnea.  Past Surgical History: He  has a past surgical history that includes Nasal sinus surgery (1994, 6789,3810); heriorraphy; Middle ear surgery; Appendectomy; asytole post nasal polypectomy (2008); Tonsillectomy and adenoidectomy; colonoscopy with polypectomy (2010); and Hernia repair.  Family History: His family history includes Cancer in his maternal grandfather and mother; Dementia in his father; Heart disease in his father and mother; Hyperlipidemia in his mother; Hypertension in his father; Other in his sister and sister.  Social History: He  reports that he has never smoked. He has never used smokeless tobacco. He reports that he drinks about 2.0 standard drinks of alcohol per week. He reports that he does not use drugs.  Medications: Allergies as of 11/16/2017      Reactions   Diltiazem Hcl    ? symptoms   Penicillins    Rash with Amoxicillin with Mono in college      Medication List        Accurate as of 11/16/17 11:43 AM. Always use your most recent med list.          BAYER LOW STRENGTH 81 MG EC tablet Generic drug:  aspirin Take 81 mg by mouth daily.   ezetimibe-simvastatin 10-20 MG tablet Commonly known as:  VYTORIN TAKE (1) TABLET DAILY AT BEDTIME.   labetalol 300 MG tablet Commonly known as:  NORMODYNE TAKE (1/2) TABLET TWICE DAILY.   metFORMIN 500 MG tablet Commonly known as:  GLUCOPHAGE Take 2 tablets (1,000 mg total) by mouth 2 (two) times daily with a meal.   spironolactone 25 MG tablet Commonly known as:  ALDACTONE TAKE 1 TABLET ONCE DAILY.   telmisartan 80 MG tablet Commonly known as:  MICARDIS TAKE 1 TABLET ONCE DAILY.   vitamin C 1000 MG tablet Take 500 mg by  mouth daily.

## 2017-11-16 NOTE — Patient Instructions (Signed)
Will arrange for home sleep study Will call to arrange for follow up after sleep study reviewed  

## 2017-11-19 ENCOUNTER — Ambulatory Visit (INDEPENDENT_AMBULATORY_CARE_PROVIDER_SITE_OTHER): Payer: Medicare Other | Admitting: Cardiology

## 2017-11-19 ENCOUNTER — Encounter: Payer: Self-pay | Admitting: Cardiology

## 2017-11-19 VITALS — BP 120/82 | HR 65 | Ht 70.5 in | Wt 226.2 lb

## 2017-11-19 DIAGNOSIS — R001 Bradycardia, unspecified: Secondary | ICD-10-CM

## 2017-11-19 DIAGNOSIS — E7800 Pure hypercholesterolemia, unspecified: Secondary | ICD-10-CM

## 2017-11-19 DIAGNOSIS — I10 Essential (primary) hypertension: Secondary | ICD-10-CM

## 2017-11-19 NOTE — Patient Instructions (Signed)
Your physician wants you to follow-up in: ONE YEAR WITH DR CRENSHAW You will receive a reminder letter in the mail two months in advance. If you don't receive a letter, please call our office to schedule the follow-up appointment.   If you need a refill on your cardiac medications before your next appointment, please call your pharmacy.  

## 2017-11-19 NOTE — Progress Notes (Signed)
HPI: FU hyperlipidemia. I saw him previously for bradycardia in 2008. Holter monitor showed sinus rhythm with brief PAT and PVC. Nuclear study showed ejection fraction 62% and no ischemia or infarction. Last echocardiogram 2010 showed normal LV function and mild mitral regurgitation.  Since last seen he denies dyspnea on exertion, orthopnea, PND, pedal edema, chest pain or syncope.  Current Outpatient Medications  Medication Sig Dispense Refill  . aspirin (BAYER LOW STRENGTH) 81 MG EC tablet Take 81 mg by mouth daily.      Marland Kitchen ezetimibe-simvastatin (VYTORIN) 10-20 MG tablet TAKE (1) TABLET DAILY AT BEDTIME. 90 tablet 1  . labetalol (NORMODYNE) 300 MG tablet TAKE (1/2) TABLET TWICE DAILY. 90 tablet 1  . metFORMIN (GLUCOPHAGE) 500 MG tablet Take 2 tablets (1,000 mg total) by mouth 2 (two) times daily with a meal. 360 tablet 1  . spironolactone (ALDACTONE) 25 MG tablet TAKE 1 TABLET ONCE DAILY. 90 tablet 2  . telmisartan (MICARDIS) 80 MG tablet TAKE 1 TABLET ONCE DAILY. 90 tablet 1   No current facility-administered medications for this visit.      Past Medical History:  Diagnosis Date  . Allergy    seasonal  . Asystole ([place])    "vagal response"post nasal polypectomy  . Diabetes mellitus without complication ([place])    pre but take metformin  . Diverticulosis of colon   . FH: colonic polyps    hx of  . Hyperlipidemia   . Hypertension   . Mitral regurgitation    mild & aortic Valve calcification on 2d echo;sbe prophylaxis  . Parotitis, acute 2013  . Seasonal rhinitis   . Sleep apnea    CPAP    Past Surgical History:  Procedure Laterality Date  . APPENDECTOMY    . asytole post nasal polypectomy  2008  . colonoscopy with polypectomy  2010   Dr Olevia Perches  . heriorraphy     x2  . HERNIA REPAIR    . MIDDLE EAR SURGERY     TM replacement  . NASAL SINUS SURGERY  1994, [id number]   X3  . TONSILLECTOMY AND ADENOIDECTOMY      Social History   Socioeconomic History  .  Marital status: Married    Spouse name: Not on file  . Number of children: 2  . Years of education: 38  . Highest education level: Not on file  Occupational History  . Occupation: Retired    Fish farm manager: RETIRED  Social Needs  . Financial resource strain: Not on file  . Food insecurity:    Worry: Not on file    Inability: Not on file  . Transportation needs:    Medical: Not on file    Non-medical: Not on file  Tobacco Use  . Smoking status: Never Smoker  . Smokeless tobacco: Never Used  Substance and Sexual Activity  . Alcohol use: Yes    Alcohol/week: 2.0 standard drinks    Types: 1 Glasses of wine, 1 Shots of liquor per week    Comment: socially < 2/ week  . Drug use: No  . Sexual activity: Yes  Lifestyle  . Physical activity:    Days per week: Not on file    Minutes per session: Not on file  . Stress: Not on file  Relationships  . Social connections:    Talks on phone: Not on file    Gets together: Not on file    Attends religious service: Not on file    Active member of  club or organization: Not on file    Attends meetings of clubs or organizations: Not on file    Relationship status: Not on file  . Intimate partner violence:    Fear of current or ex partner: Not on file    Emotionally abused: Not on file    Physically abused: Not on file    Forced sexual activity: Not on file  Other Topics Concern  . Not on file  Social History Narrative   Fun: Water, swimming, [employer]; Psychologist, occupational work    Denies abuse and feels safe at home.   Regular exercise       Family History  Problem Relation Age of Onset  . Cancer Mother        liver &multiple myeloma  . Heart disease Mother        CABG, Angina  . Hyperlipidemia Mother   . Hypertension Father   . Heart disease Father        MI in 43"s  . Dementia Father   . Cancer Maternal Grandfather        bone  . Other Sister        perforated bowel  . Other Sister        cns aneurysm  . Diabetes Neg Hx   . Stroke  Neg Hx   . Colon cancer Neg Hx     ROS: no fevers or chills, productive cough, hemoptysis, dysphasia, odynophagia, melena, hematochezia, dysuria, hematuria, rash, seizure activity, orthopnea, PND, pedal edema, claudication. Remaining systems are negative.  Physical Exam: Well-developed well-nourished in no acute distress.  Skin is warm and dry.  HEENT is normal.  Neck is supple.  Chest is clear to auscultation with normal expansion.  Cardiovascular exam is regular rate and rhythm.  Abdominal exam nontender or distended. No masses palpated. Extremities show no edema. neuro grossly intact  ECG-sinus rhythm at a rate of 65.  No ST changes.  Personally reviewed  A/P  1 hyperlipidemia-plan to continue present medications.    2 hypertension-blood pressure is controlled.  Continue present medications.    3 history of bradycardia-this occurred in the postoperative setting and he has had no additional episodes.  No plans for further evaluation.  4 diabetes mellitus-followed by primary care.  Kirk Ruths, MD

## 2017-11-20 ENCOUNTER — Encounter: Payer: Self-pay | Admitting: Internal Medicine

## 2018-01-04 ENCOUNTER — Ambulatory Visit (INDEPENDENT_AMBULATORY_CARE_PROVIDER_SITE_OTHER): Payer: Medicare Other | Admitting: Emergency Medicine

## 2018-01-04 DIAGNOSIS — Z23 Encounter for immunization: Secondary | ICD-10-CM | POA: Diagnosis not present

## 2018-01-04 DIAGNOSIS — G4733 Obstructive sleep apnea (adult) (pediatric): Secondary | ICD-10-CM | POA: Diagnosis not present

## 2018-01-05 DIAGNOSIS — G4733 Obstructive sleep apnea (adult) (pediatric): Secondary | ICD-10-CM | POA: Diagnosis not present

## 2018-01-06 ENCOUNTER — Other Ambulatory Visit: Payer: Self-pay | Admitting: *Deleted

## 2018-01-06 DIAGNOSIS — G4733 Obstructive sleep apnea (adult) (pediatric): Secondary | ICD-10-CM

## 2018-01-07 ENCOUNTER — Telehealth: Payer: Self-pay | Admitting: Pulmonary Disease

## 2018-01-07 DIAGNOSIS — L720 Epidermal cyst: Secondary | ICD-10-CM | POA: Diagnosis not present

## 2018-01-07 NOTE — Telephone Encounter (Signed)
Attempted to call patient today regarding results. I did not receive an answer at time of call. I have left a voicemail message for pt to return call. X1  

## 2018-01-07 NOTE — Telephone Encounter (Signed)
HST 01/04/18 >> AHI 7.5, SpO2 low 87%.   Please let him know that the sleep study showed mild obstructive sleep apnea.  Please arrange for ROV with a nurse practitioner to review treatment options.

## 2018-01-08 NOTE — Telephone Encounter (Signed)
Attempted to call patient today regarding results. I did not receive an answer at time of call. I have left a voicemail message for pt to return call. X2  

## 2018-01-12 NOTE — Telephone Encounter (Signed)
LMOM X1 

## 2018-01-12 NOTE — Telephone Encounter (Signed)
Attempted to call patient today regarding results. I have left a voicemail message for pt to return call. X3 We have left three messages for pt to call our office back regarding results. No response at this time, a letter has been placed in mail today for pt to call our office. Mailed letter of communication today. Nothing further needed at this time.    

## 2018-01-15 ENCOUNTER — Telehealth: Payer: Self-pay | Admitting: Pulmonary Disease

## 2018-01-15 NOTE — Telephone Encounter (Signed)
Attempted to call Patient.  VM left for Patient to call back.

## 2018-01-18 ENCOUNTER — Ambulatory Visit (INDEPENDENT_AMBULATORY_CARE_PROVIDER_SITE_OTHER): Payer: Medicare Other | Admitting: Internal Medicine

## 2018-01-18 ENCOUNTER — Encounter: Payer: Self-pay | Admitting: Internal Medicine

## 2018-01-18 VITALS — BP 132/76 | HR 64 | Temp 98.7°F | Resp 16 | Ht 70.5 in | Wt 224.8 lb

## 2018-01-18 DIAGNOSIS — H6981 Other specified disorders of Eustachian tube, right ear: Secondary | ICD-10-CM | POA: Insufficient documentation

## 2018-01-18 DIAGNOSIS — H9193 Unspecified hearing loss, bilateral: Secondary | ICD-10-CM | POA: Diagnosis not present

## 2018-01-18 DIAGNOSIS — H6983 Other specified disorders of Eustachian tube, bilateral: Secondary | ICD-10-CM

## 2018-01-18 MED ORDER — FLUTICASONE PROPIONATE 50 MCG/ACT NA SUSP: 2.0000 | Freq: Every day | NASAL | 6 refills | Status: DC

## 2018-01-18 MED ORDER — PREDNISONE 20 MG PO TABS: 40.0000 mg | ORAL_TABLET | Freq: Every day | ORAL | 0 refills | Status: DC

## 2018-01-18 NOTE — Progress Notes (Signed)
Subjective:    Patient ID: Jonathon Armstrong, male    DOB: February 13, 1951, 67 y.o.   MRN: [id number]  HPI He is here for an acute visit for cold symptoms.   His symptoms started after vacation on 9/27.  His head was never unplugged from the airplane. He had mild cold symptoms.  He took antibiotics x 3-4 days that were leftover and it seemed to help.     One week ago he noticed his head will not unclog.  His is having decreased hearing.  Momentarily it will clear up and then instantly clog back up.  He had something like this happen in the past and he had tubes put in.     he has mild nasal congestion and the pressure or clogged sensation in his ears, but denies ear pain.  He has decreased hearing.  He denies fever, chills, sinus pain and cough.  He denies headaches.    He has not taken anything for his symptoms, except the antibiotic.     Medications and allergies reviewed with patient and updated if appropriate.  Patient Active Problem List   Diagnosis Date Noted  . Diabetes ([place]) 02/25/2013  . Hyperlipidemia 12/05/2010  . NONSPECIFIC ABNORMAL ELECTROCARDIOGRAM 08/13/2009  . HYPERURICEMIA, ASYMPTOMATIC 02/07/2008  . DIVERTICULOSIS, COLON 10/08/2007  . History of colonic polyps 10/08/2007  . Essential hypertension 03/26/2007  . ASYSTOLE 02/22/2007  . Obstructive sleep apnea 08/28/2006    Current Outpatient Medications on File Prior to Visit  Medication Sig Dispense Refill  . aspirin (BAYER LOW STRENGTH) 81 MG EC tablet Take 81 mg by mouth daily.      Marland Kitchen ezetimibe-simvastatin (VYTORIN) 10-20 MG tablet TAKE (1) TABLET DAILY AT BEDTIME. 90 tablet 1  . labetalol (NORMODYNE) 300 MG tablet TAKE (1/2) TABLET TWICE DAILY. 90 tablet 1  . metFORMIN (GLUCOPHAGE) 500 MG tablet Take 2 tablets (1,000 mg total) by mouth 2 (two) times daily with a meal. 360 tablet 1  . spironolactone (ALDACTONE) 25 MG tablet TAKE 1 TABLET ONCE DAILY. 90 tablet 2  . telmisartan (MICARDIS) 80 MG tablet TAKE 1 TABLET  ONCE DAILY. 90 tablet 1   No current facility-administered medications on file prior to visit.     Past Medical History:  Diagnosis Date  . Allergy    seasonal  . Asystole ([place])    "vagal response"post nasal polypectomy  . Diabetes mellitus without complication ([place])    pre but take metformin  . Diverticulosis of colon   . FH: colonic polyps    hx of  . Hyperlipidemia   . Hypertension   . Mitral regurgitation    mild & aortic Valve calcification on 2d echo;sbe prophylaxis  . Parotitis, acute 2013  . Seasonal rhinitis   . Sleep apnea    CPAP    Past Surgical History:  Procedure Laterality Date  . APPENDECTOMY    . asytole post nasal polypectomy  2008  . colonoscopy with polypectomy  2010   Dr Olevia Perches  . heriorraphy     x2  . HERNIA REPAIR    . MIDDLE EAR SURGERY     TM replacement  . NASAL SINUS SURGERY  1994, [id number]   X3  . TONSILLECTOMY AND ADENOIDECTOMY      Social History   Socioeconomic History  . Marital status: Married    Spouse name: Not on file  . Number of children: 2  . Years of education: 17  . Highest education level: Not on file  Occupational History  . Occupation: Retired    Fish farm manager: RETIRED  Social Needs  . Financial resource strain: Not on file  . Food insecurity:    Worry: Not on file    Inability: Not on file  . Transportation needs:    Medical: Not on file    Non-medical: Not on file  Tobacco Use  . Smoking status: Never Smoker  . Smokeless tobacco: Never Used  Substance and Sexual Activity  . Alcohol use: Yes    Alcohol/week: 2.0 standard drinks    Types: 1 Glasses of wine, 1 Shots of liquor per week    Comment: socially < 2/ week  . Drug use: No  . Sexual activity: Yes  Lifestyle  . Physical activity:    Days per week: Not on file    Minutes per session: Not on file  . Stress: Not on file  Relationships  . Social connections:    Talks on phone: Not on file    Gets together: Not on file    Attends religious  service: Not on file    Active member of club or organization: Not on file    Attends meetings of clubs or organizations: Not on file    Relationship status: Not on file  Other Topics Concern  . Not on file  Social History Narrative   Fun: Water, swimming, [employer]; Psychologist, occupational work    Denies abuse and feels safe at home.   Regular exercise       Family History  Problem Relation Age of Onset  . Cancer Mother        liver &multiple myeloma  . Heart disease Mother        CABG, Angina  . Hyperlipidemia Mother   . Hypertension Father   . Heart disease Father        MI in 42"s  . Dementia Father   . Cancer Maternal Grandfather        bone  . Other Sister        perforated bowel  . Other Sister        cns aneurysm  . Diabetes Neg Hx   . Stroke Neg Hx   . Colon cancer Neg Hx     Review of Systems  Constitutional: Negative for chills and fever.  HENT: Positive for congestion and hearing loss. Negative for ear pain (ear clogged and pressure feeling R > L), sinus pressure, sinus pain and sore throat.   Respiratory: Negative for cough, shortness of breath and wheezing.   Neurological: Negative for dizziness, light-headedness and headaches.       Objective:   Vitals:   01/18/18 1450  BP: 132/76  Pulse: 64  Resp: 16  Temp: 98.7 F (37.1 C)  SpO2: 97%   BP Readings from Last 3 Encounters:  01/18/18 132/76  11/19/17 120/82  11/16/17 140/82   Wt Readings from Last 3 Encounters:  01/18/18 224 lb 12.8 oz (102 kg)  11/19/17 226 lb 3.2 oz (102.6 kg)  11/16/17 225 lb 6.4 oz (102.2 kg)   Body mass index is 31.8 kg/m.   Physical Exam    GENERAL APPEARANCE: Appears stated age, well appearing, NAD EYES: conjunctiva clear, no icterus HEENT: bilateral tympanic membranes and ear canals normal, oropharynx with no erythema, no thyromegaly, trachea midline, no cervical or supraclavicular lymphadenopathy LUNGS: Clear to auscultation without wheeze or crackles, unlabored  breathing, good air entry bilaterally CARDIOVASCULAR: Normal S1,S2 without murmurs, no edema SKIN: Warm, dry  Assessment & Plan:    See Problem List for Assessment and Plan of chronic medical problems.

## 2018-01-18 NOTE — Assessment & Plan Note (Signed)
ETD related to flying and mild cold symptoms Referral to ENT Trial of flonase daily and prednisone 40 mg daily x 5 days - reviewed possible side effects

## 2018-01-18 NOTE — Telephone Encounter (Signed)
HST 01/04/18 >> AHI 7.5, SpO2 low 87%.   Please let him know that the sleep study showed mild obstructive sleep apnea.  Please arrange for ROV with a nurse practitioner to review treatment options.  ATC patient, call went straight to VM. LMTCB.

## 2018-01-18 NOTE — Patient Instructions (Addendum)
Take the steroids as directed.  Use the Flonase daily.   A referral was ordered for ENT.  They will call you to schedule this.     Eustachian Tube Dysfunction The eustachian tube connects the middle ear to the back of the nose. It regulates air pressure in the middle ear by allowing air to move between the ear and nose. It also helps to drain fluid from the middle ear space. When the eustachian tube does not function properly, air pressure, fluid, or both can build up in the middle ear. Eustachian tube dysfunction can affect one or both ears. What are the causes? This condition happens when the eustachian tube becomes blocked or cannot open normally. This may result from:  Ear infections.  Colds and other upper respiratory infections.  Allergies.  Irritation, such as from cigarette smoke or acid from the stomach coming up into the esophagus (gastroesophageal reflux).  Sudden changes in air pressure, such as from descending in an airplane.  Abnormal growths in the nose or throat, such as nasal polyps, tumors, or enlarged tissue at the back of the throat (adenoids).  What increases the risk? This condition may be more likely to develop in people who smoke and people who are overweight. Eustachian tube dysfunction may also be more likely to develop in children, especially children who have:  Certain birth defects of the mouth, such as cleft palate.  Large tonsils and adenoids.  What are the signs or symptoms? Symptoms of this condition may include:  A feeling of fullness in the ear.  Ear pain.  Clicking or popping noises in the ear.  Ringing in the ear.  Hearing loss.  Loss of balance.  Symptoms may get worse when the air pressure around you changes, such as when you travel to an area of high elevation or fly on an airplane. How is this diagnosed? This condition may be diagnosed based on:  Your symptoms.  A physical exam of your ear, nose, and throat.  Tests, such  as those that measure: ? The movement of your eardrum (tympanogram). ? Your hearing (audiometry).  How is this treated? Treatment depends on the cause and severity of your condition. If your symptoms are mild, you may be able to relieve your symptoms by moving air into ("popping") your ears. If you have symptoms of fluid in your ears, treatment may include:  Decongestants.  Antihistamines.  Nasal sprays or ear drops that contain medicines that reduce swelling (steroids).  In some cases, you may need to have a procedure to drain the fluid in your eardrum (myringotomy). In this procedure, a small tube is placed in the eardrum to:  Drain the fluid.  Restore the air in the middle ear space.  Follow these instructions at home:  Take over-the-counter and prescription medicines only as told by your health care provider.  Use techniques to help pop your ears as recommended by your health care provider. These may include: ? Chewing gum. ? Yawning. ? Frequent, forceful swallowing. ? Closing your mouth, holding your nose closed, and gently blowing as if you are trying to blow air out of your nose.  Do not do any of the following until your health care provider approves: ? Travel to high altitudes. ? Fly in airplanes. ? Work in a Pension scheme manager or room. ? Scuba dive.  Keep your ears dry. Dry your ears completely after showering or bathing.  Do not smoke.  Keep all follow-up visits as told by your health  care provider. This is important. Contact a health care provider if:  Your symptoms do not go away after treatment.  Your symptoms come back after treatment.  You are unable to pop your ears.  You have: ? A fever. ? Pain in your ear. ? Pain in your head or neck. ? Fluid draining from your ear.  Your hearing suddenly changes.  You become very dizzy.  You lose your balance. This information is not intended to replace advice given to you by your health care provider.  Make sure you discuss any questions you have with your health care provider. Document Released: 04/06/2015 Document Revised: 08/16/2015 Document Reviewed: 03/29/2014 Elsevier Interactive Patient Education  [employer].

## 2018-01-18 NOTE — Assessment & Plan Note (Signed)
related to ETD Treat ETD and refer to ENT

## 2018-01-18 NOTE — Telephone Encounter (Signed)
Pt is calling back [phone]

## 2018-01-19 DIAGNOSIS — H6983 Other specified disorders of Eustachian tube, bilateral: Secondary | ICD-10-CM | POA: Diagnosis not present

## 2018-01-19 NOTE — Telephone Encounter (Signed)
Attempted to call pt but line went straight to VM. Left message for pt to return call. 

## 2018-01-20 NOTE — Telephone Encounter (Signed)
Attempted to contact pt. I did not receive an answer. I have left a message for pt to return our call.  

## 2018-01-21 DIAGNOSIS — Z4802 Encounter for removal of sutures: Secondary | ICD-10-CM | POA: Diagnosis not present

## 2018-01-21 NOTE — Telephone Encounter (Signed)
We have attempted to contact pt several times with no success or call back from pt. Per triage protocol, message will be closed.  

## 2018-01-22 ENCOUNTER — Other Ambulatory Visit: Payer: Self-pay | Admitting: Internal Medicine

## 2018-01-25 ENCOUNTER — Ambulatory Visit: Payer: Medicare Other

## 2018-01-25 ENCOUNTER — Ambulatory Visit: Payer: Medicare Other | Admitting: Internal Medicine

## 2018-01-26 ENCOUNTER — Encounter: Payer: Self-pay | Admitting: Internal Medicine

## 2018-01-28 ENCOUNTER — Ambulatory Visit (INDEPENDENT_AMBULATORY_CARE_PROVIDER_SITE_OTHER): Payer: Medicare Other | Admitting: Emergency Medicine

## 2018-01-28 ENCOUNTER — Encounter: Payer: Self-pay | Admitting: Emergency Medicine

## 2018-01-28 ENCOUNTER — Other Ambulatory Visit: Payer: Self-pay

## 2018-01-28 VITALS — BP 122/69 | HR 81 | Temp 98.9°F | Resp 16 | Ht 69.0 in | Wt 225.4 lb

## 2018-01-28 DIAGNOSIS — R05 Cough: Secondary | ICD-10-CM

## 2018-01-28 DIAGNOSIS — J22 Unspecified acute lower respiratory infection: Secondary | ICD-10-CM

## 2018-01-28 DIAGNOSIS — R0981 Nasal congestion: Secondary | ICD-10-CM

## 2018-01-28 DIAGNOSIS — J029 Acute pharyngitis, unspecified: Secondary | ICD-10-CM | POA: Diagnosis not present

## 2018-01-28 DIAGNOSIS — R059 Cough, unspecified: Secondary | ICD-10-CM

## 2018-01-28 MED ORDER — AZITHROMYCIN 250 MG PO TABS: ORAL_TABLET | ORAL | 0 refills | Status: DC

## 2018-01-28 NOTE — Progress Notes (Signed)
Jonathon Armstrong 67 y.o.   Chief Complaint  Patient presents with  . Ear Problem    both 12/2017  . Sore Throat    and cough    HISTORY OF PRESENT ILLNESS: This is a 67 y.o. male recently travelled, developed sinus congestion with ear fullness.  Was seen by PCP and then ENT last week.  Treated with nasal corticosteroid and decongestants with some relief.  Throat bothering him now he started coughing 2 days ago.  Also had a sore throat.  No other significant symptoms.  HPI   Prior to Admission medications   Medication Sig Start Date End Date Taking? Authorizing Provider  aspirin (BAYER LOW STRENGTH) 81 MG EC tablet Take 81 mg by mouth daily.     Yes [provider]  ezetimibe-simvastatin (VYTORIN) 10-20 MG tablet TAKE (1) TABLET DAILY AT BEDTIME. 01/22/18  Yes Burns, Claudina Lick, MD  labetalol (NORMODYNE) 300 MG tablet TAKE (1/2) TABLET TWICE DAILY. 08/04/17  Yes Burns, Claudina Lick, MD  metFORMIN (GLUCOPHAGE) 500 MG tablet Take 2 tablets (1,000 mg total) by mouth 2 (two) times daily with a meal. 08/12/17  Yes Burns, Claudina Lick, MD  spironolactone (ALDACTONE) 25 MG tablet TAKE 1 TABLET ONCE DAILY. 08/28/17  Yes Burns, Claudina Lick, MD  telmisartan (MICARDIS) 80 MG tablet TAKE 1 TABLET ONCE DAILY. 08/20/17  Yes Burns, Claudina Lick, MD  fluticasone (FLONASE) 50 MCG/ACT nasal spray Place 2 sprays into both nostrils daily. Patient not taking: Reported on 01/28/2018 01/18/18   [name], MD  predniSONE (DELTASONE) 20 MG tablet Take 2 tablets (40 mg total) by mouth daily with breakfast. Patient not taking: Reported on 01/28/2018 01/18/18   [name], MD    Allergies  Allergen Reactions  . Diltiazem Hcl     ? symptoms  . Penicillins     Rash with Amoxicillin with Mono in college    Patient Active Problem List   Diagnosis Date Noted  . Eustachian tube dysfunction, bilateral 01/18/2018  . Bilateral hearing loss 01/18/2018  . Diabetes ([place]) 02/25/2013  . Hyperlipidemia 12/05/2010  .  NONSPECIFIC ABNORMAL ELECTROCARDIOGRAM 08/13/2009  . HYPERURICEMIA, ASYMPTOMATIC 02/07/2008  . DIVERTICULOSIS, COLON 10/08/2007  . History of colonic polyps 10/08/2007  . Essential hypertension 03/26/2007  . ASYSTOLE 02/22/2007  . Obstructive sleep apnea 08/28/2006    Past Medical History:  Diagnosis Date  . Allergy    seasonal  . Asystole ([place])    "vagal response"post nasal polypectomy  . Diabetes mellitus without complication ([place])    pre but take metformin  . Diverticulosis of colon   . FH: colonic polyps    hx of  . Hyperlipidemia   . Hypertension   . Mitral regurgitation    mild & aortic Valve calcification on 2d echo;sbe prophylaxis  . Parotitis, acute 2013  . Seasonal rhinitis   . Sleep apnea    CPAP    Past Surgical History:  Procedure Laterality Date  . APPENDECTOMY    . asytole post nasal polypectomy  2008  . colonoscopy with polypectomy  2010   Dr Olevia Perches  . heriorraphy     x2  . HERNIA REPAIR    . MIDDLE EAR SURGERY     TM replacement  . NASAL SINUS SURGERY  1994, [id number]   X3  . TONSILLECTOMY AND ADENOIDECTOMY      Social History   Socioeconomic History  . Marital status: Married    Spouse name: Not on file  . Number of  children: 2  . Years of education: 30  . Highest education level: Not on file  Occupational History  . Occupation: Retired    Fish farm manager: RETIRED  Social Needs  . Financial resource strain: Not on file  . Food insecurity:    Worry: Not on file    Inability: Not on file  . Transportation needs:    Medical: Not on file    Non-medical: Not on file  Tobacco Use  . Smoking status: Never Smoker  . Smokeless tobacco: Never Used  Substance and Sexual Activity  . Alcohol use: Yes    Alcohol/week: 2.0 standard drinks    Types: 1 Glasses of wine, 1 Shots of liquor per week    Comment: socially < 2/ week  . Drug use: No  . Sexual activity: Yes  Lifestyle  . Physical activity:    Days per week: Not on file    Minutes per  session: Not on file  . Stress: Not on file  Relationships  . Social connections:    Talks on phone: Not on file    Gets together: Not on file    Attends religious service: Not on file    Active member of club or organization: Not on file    Attends meetings of clubs or organizations: Not on file    Relationship status: Not on file  . Intimate partner violence:    Fear of current or ex partner: Not on file    Emotionally abused: Not on file    Physically abused: Not on file    Forced sexual activity: Not on file  Other Topics Concern  . Not on file  Social History Narrative   Fun: Water, swimming, [employer]; Psychologist, occupational work    Denies abuse and feels safe at home.   Regular exercise       Family History  Problem Relation Age of Onset  . Cancer Mother        liver &multiple myeloma  . Heart disease Mother        CABG, Angina  . Hyperlipidemia Mother   . Hypertension Father   . Heart disease Father        MI in 57"s  . Dementia Father   . Cancer Maternal Grandfather        bone  . Other Sister        perforated bowel  . Other Sister        cns aneurysm  . Diabetes Neg Hx   . Stroke Neg Hx   . Colon cancer Neg Hx      Review of Systems  Constitutional: Negative.  Negative for chills and fever.  HENT: Positive for congestion, ear pain, sinus pain and sore throat.   Eyes: Negative.  Negative for discharge and redness.  Respiratory: Positive for cough. Negative for hemoptysis, shortness of breath and wheezing.   Cardiovascular: Negative.  Negative for chest pain and palpitations.  Gastrointestinal: Negative.  Negative for abdominal pain, diarrhea, nausea and vomiting.  Genitourinary: Negative.  Negative for dysuria and hematuria.  Musculoskeletal: Negative.  Negative for back pain, myalgias and neck pain.  Skin: Negative.  Negative for rash.  Neurological: Negative.  Negative for dizziness and headaches.  Endo/Heme/Allergies: Negative.   All other systems reviewed  and are negative.   Vitals:   01/28/18 1455  BP: 122/69  Pulse: 81  Resp: 16  Temp: 98.9 F (37.2 C)  SpO2: 97%    Physical Exam  Constitutional: He is  oriented to person, place, and time. He appears well-developed and well-nourished.  HENT:  Head: Normocephalic and atraumatic.  Right Ear: Tympanic membrane, external ear and ear canal normal.  Left Ear: Tympanic membrane, external ear and ear canal normal.  Nose: Nose normal.  Mouth/Throat: Oropharynx is clear and moist.  Eyes: Pupils are equal, round, and reactive to light. Conjunctivae and EOM are normal.  Neck: Normal range of motion. Neck supple. No JVD present. No thyromegaly present.  Cardiovascular: Normal rate, regular rhythm and normal heart sounds.  Pulmonary/Chest: Effort normal and breath sounds normal.  Abdominal: Soft. There is no tenderness.  Musculoskeletal: Normal range of motion.  Lymphadenopathy:    He has no cervical adenopathy.  Neurological: He is alert and oriented to person, place, and time. No sensory deficit. He exhibits normal muscle tone. Coordination normal.  Skin: Skin is warm and dry. Capillary refill takes less than 2 seconds.  Psychiatric: He has a normal mood and affect. His behavior is normal.  Vitals reviewed.   A total of 25 minutes was spent in the room with the patient, greater than 50% of which was in counseling/coordination of care regarding differential diagnosis, treatment, medications and need for follow-up if no better or worse.  ASSESSMENT & PLAN: Osby was seen today for ear problem and sore throat.  Diagnoses and all orders for this visit:  Cough  Sinus congestion  Lower respiratory infection -     azithromycin (ZITHROMAX) 250 MG tablet; Sig as indicated  Sore throat     Patient Instructions       If you have lab work done today you will be contacted with your lab results within the next 2 weeks.  If you have not heard from [place] then please contact us. The  fastest way to get your results is to register for My Chart.   IF you received an x-ray today, you will receive an invoice from [facility]. Please contact [facility] at [phone] with questions or concerns regarding your invoice.   IF you received labwork today, you will receive an invoice from [facility]. Please contact [facility] at [phone] with questions or concerns regarding your invoice.   Our billing staff will not be able to assist you with questions regarding bills from these companies.  You will be contacted with the lab results as soon as they are available. The fastest way to get your results is to activate your My Chart account. Instructions are located on the last page of this paperwork. If you have not heard from [place] regarding the results in 2 weeks, please contact this office.     Cough, Adult A cough helps to clear your throat and lungs. A cough may last only 2-3 weeks (acute), or it may last longer than 8 weeks (chronic). Many different things can cause a cough. A cough may be a sign of an illness or another medical condition. Follow these instructions at home:  Pay attention to any changes in your cough.  Take medicines only as told by your doctor. ? If you were prescribed an antibiotic medicine, take it as told by your doctor. Do not stop taking it even if you start to feel better. ? Talk with your doctor before you try using a cough medicine.  Drink enough fluid to keep your pee (urine) clear or pale yellow.  If the air is dry, use a cold steam vaporizer or humidifier in your home.  Stay away from things that make you cough at  work or at home.  If your cough is worse at night, try using extra pillows to raise your head up higher while you sleep.  Do not smoke, and try not to be around smoke. If you need help quitting, ask your doctor.  Do not have caffeine.  Do not drink alcohol.  Rest as needed. Contact a doctor if:  You have new  problems (symptoms).  You cough up yellow fluid (pus).  Your cough does not get better after 2-3 weeks, or your cough gets worse.  Medicine does not help your cough and you are not sleeping well.  You have pain that gets worse or pain that is not helped with medicine.  You have a fever.  You are losing weight and you do not know why.  You have night sweats. Get help right away if:  You cough up blood.  You have trouble breathing.  Your heartbeat is very fast. This information is not intended to replace advice given to you by your health care provider. Make sure you discuss any questions you have with your health care provider. Document Released: 11/21/2010 Document Revised: 08/16/2015 Document Reviewed: 05/17/2014 Elsevier Interactive Patient Education  2018 Elsevier Inc.     Agustina Caroli, MD [facility]

## 2018-01-28 NOTE — Patient Instructions (Addendum)
     If you have lab work done today you will be contacted with your lab results within the next 2 weeks.  If you have not heard from [place] then please contact us. The fastest way to get your results is to register for My Chart.   IF you received an x-ray today, you will receive an invoice from [facility] Radiology. Please contact [facility] Radiology at [phone] with questions or concerns regarding your invoice.   IF you received labwork today, you will receive an invoice from [facility]. Please contact [facility] at [phone] with questions or concerns regarding your invoice.   Our billing staff will not be able to assist you with questions regarding bills from these companies.  You will be contacted with the lab results as soon as they are available. The fastest way to get your results is to activate your My Chart account. Instructions are located on the last page of this paperwork. If you have not heard from [place] regarding the results in 2 weeks, please contact this office.     Cough, Adult A cough helps to clear your throat and lungs. A cough may last only 2-3 weeks (acute), or it may last longer than 8 weeks (chronic). Many different things can cause a cough. A cough may be a sign of an illness or another medical condition. Follow these instructions at home:  Pay attention to any changes in your cough.  Take medicines only as told by your doctor. ? If you were prescribed an antibiotic medicine, take it as told by your doctor. Do not stop taking it even if you start to feel better. ? Talk with your doctor before you try using a cough medicine.  Drink enough fluid to keep your pee (urine) clear or pale yellow.  If the air is dry, use a cold steam vaporizer or humidifier in your home.  Stay away from things that make you cough at work or at home.  If your cough is worse at night, try using extra pillows to raise your head up higher while you sleep.  Do not smoke, and try not to be  around smoke. If you need help quitting, ask your doctor.  Do not have caffeine.  Do not drink alcohol.  Rest as needed. Contact a doctor if:  You have new problems (symptoms).  You cough up yellow fluid (pus).  Your cough does not get better after 2-3 weeks, or your cough gets worse.  Medicine does not help your cough and you are not sleeping well.  You have pain that gets worse or pain that is not helped with medicine.  You have a fever.  You are losing weight and you do not know why.  You have night sweats. Get help right away if:  You cough up blood.  You have trouble breathing.  Your heartbeat is very fast. This information is not intended to replace advice given to you by your health care provider. Make sure you discuss any questions you have with your health care provider. Document Released: 11/21/2010 Document Revised: 08/16/2015 Document Reviewed: 05/17/2014 Elsevier Interactive Patient Education  [employer].

## 2018-02-04 ENCOUNTER — Other Ambulatory Visit: Payer: Self-pay | Admitting: Internal Medicine

## 2018-02-16 ENCOUNTER — Other Ambulatory Visit: Payer: Self-pay | Admitting: Internal Medicine

## 2018-03-01 NOTE — Progress Notes (Addendum)
Subjective:   Jonathon Armstrong is a 67 y.o. male who presents for Medicare Annual/Subsequent preventive examination.  Review of Systems:  No ROS.  Medicare Wellness Visit. Additional risk factors are reflected in the social history.  Cardiac Risk Factors include: advanced age (>17mn, >>58women);diabetes mellitus;dyslipidemia;male gender;hypertension Sleep patterns: feels rested on waking, gets up 1 times nightly to void and sleeps 6 hours nightly.    Home Safety/Smoke Alarms: Feels safe in home. Smoke alarms in place.  Living environment; residence and Firearm Safety: [place] no firearms Lives with wife, no needs for DME, good support system. Seat Belt Safety/Bike Helmet: Wears seat belt.   PSA-  Lab Results  Component Value Date   PSA 2.72 07/24/2017   PSA 2.11 04/30/2016   PSA 2.00 03/28/2015       Objective:    Vitals: BP 132/74   Pulse 65   Temp 98.4 F (36.9 C)   Resp 17   Ht _0  (1.753 m)   Wt 222 lb (100.7 kg)   SpO2 98%   BMI 32.78 kg/m   Body mass index is 32.78 kg/m.  Advanced Directives 03/02/2018 09/29/2014  Does Patient Have a Medical Advance Directive? Yes Yes  Type of [place]amed[place]of [place] will -  Copy of HF[place]in Chart? No - copy requested -    Tobacco Social History   Tobacco Use  Smoking Status Never Smoker  Smokeless Tobacco Never Used     Counseling given: Not Answered  Past Medical History:  Diagnosis Date  . Allergy    seasonal  . [place]    "vagal response"post nasal polypectomy  . Diabetes mellitus without complication ([place]    pre but take metformin  . Diverticulosis of colon   . FH: colonic polyps    hx of  . Hyperlipidemia   . Hypertension   . Mitral regurgitation    mild & aortic Valve calcification on 2d echo;sbe prophylaxis  . Parotitis, acute 2013  . Seasonal rhinitis   . Sleep apnea    CPAP   Past Surgical History:  Procedure Laterality Date   . APPENDECTOMY    . asytole post nasal polypectomy  2008  . colonoscopy with polypectomy  2010   Dr BOlevia Perches . heriorraphy     x2  . HERNIA REPAIR    . MIDDLE EAR SURGERY     TM replacement  . NASAL SINUS SURGERY  1994, [id number]  X3  . TONSILLECTOMY AND ADENOIDECTOMY     Family History  Problem Relation Age of Onset  . Cancer Mother        liver &multiple myeloma  . Heart disease Mother        CABG, Angina  . Hyperlipidemia Mother   . Hypertension Father   . Heart disease Father        MI in 842"s . Dementia Father   . Cancer Maternal Grandfather        bone  . Other Sister        perforated bowel  . Other Sister        cns aneurysm  . Diabetes Neg Hx   . Stroke Neg Hx   . Colon cancer Neg Hx    Social History   Socioeconomic History  . Marital status: Married    Spouse name: Not on file  . Number of children: 2  . Years of education: 169 . Highest education  level: Not on file  Occupational History  . Occupation: Retired    Fish farm manager: RETIRED  Social Needs  . Financial resource strain: Not hard at all  . Food insecurity:    Worry: Never true    Inability: Never true  . Transportation needs:    Medical: No    Non-medical: No  Tobacco Use  . Smoking status: Never Smoker  . Smokeless tobacco: Never Used  Substance and Sexual Activity  . Alcohol use: Yes    Alcohol/week: 2.0 standard drinks    Types: 1 Glasses of Isma Tietje, 1 Shots of liquor per week    Comment: socially < 2/ week  . Drug use: No  . Sexual activity: Yes  Lifestyle  . Physical activity:    Days per week: 3 days    Minutes per session: 60 min  . Stress: Not at all  Relationships  . Social connections:    Talks on phone: More than three times a week    Gets together: More than three times a week    Attends religious service: More than 4 times per year    Active member of club or organization: Yes    Attends meetings of clubs or organizations: More than 4 times per year    Relationship  status: Married  Other Topics Concern  . Not on file  Social History Narrative   Fun: Water, swimming, [employer]; Psychologist, occupational work    Denies abuse and feels safe at home.   Regular exercise       Outpatient Encounter Medications as of 03/02/2018  Medication Sig  . amoxicillin (AMOXIL) 500 MG capsule Take 500 mg by mouth as needed.  Marland Kitchen aspirin (BAYER LOW STRENGTH) 81 MG EC tablet Take 81 mg by mouth daily.    Marland Kitchen ezetimibe-simvastatin (VYTORIN) 10-20 MG tablet TAKE (1) TABLET DAILY AT BEDTIME.  Marland Kitchen labetalol (NORMODYNE) 300 MG tablet TAKE (1/2) TABLET TWICE DAILY.  . metFORMIN (GLUCOPHAGE) 500 MG tablet TAKE 2 TABLETS TWICE DAILY WITH FOOD.  Marland Kitchen spironolactone (ALDACTONE) 25 MG tablet TAKE 1 TABLET ONCE DAILY.  Marland Kitchen telmisartan (MICARDIS) 80 MG tablet TAKE 1 TABLET ONCE DAILY.  . [DISCONTINUED] azithromycin (ZITHROMAX) 250 MG tablet Sig as indicated  . [DISCONTINUED] fluticasone (FLONASE) 50 MCG/ACT nasal spray Place 2 sprays into both nostrils daily. (Patient not taking: Reported on 01/28/2018)  . [DISCONTINUED] predniSONE (DELTASONE) 20 MG tablet Take 2 tablets (40 mg total) by mouth daily with breakfast. (Patient not taking: Reported on 01/28/2018)   No facility-administered encounter medications on file as of 03/02/2018.     Activities of Daily Living In your present state of health, do you have any difficulty performing the following activities: 03/02/2018  Hearing? N  Vision? N  Difficulty concentrating or making decisions? N  Walking or climbing stairs? N  Dressing or bathing? N  Doing errands, shopping? N  Preparing Food and eating ? N  Using the Toilet? N  In the past six months, have you accidently leaked urine? N  Do you have problems with loss of bowel control? N  Managing your Medications? N  Managing your Finances? N  Housekeeping or managing your Housekeeping? N  Some recent data might be hidden    Patient Care Team: [name], MD as PCP - General (Internal  Medicine)   Assessment:   This is a routine wellness examination for Jonathon Armstrong. Physical assessment deferred to PCP.   Exercise Activities and Dietary recommendations Current Exercise Habits: Home exercise routine, Type of  exercise: walking, Time (Minutes): 60, Frequency (Times/Week): 3, Weekly Exercise (Minutes/Week): 180, Intensity: Mild, Exercise limited by: None identified  Diet (meal preparation, eat out, water intake, caffeinated beverages, dairy products, fruits and vegetables): in general, a "healthy" diet  , well balanced.   Reviewed heart healthy and diabetic diet. Encouraged patient to increase daily water and healthy fluid intake.  Goals    . Patient Stated     Continue volunteer within the community. Enjoy life, love family and stay emotionally engaged with myself and others.       Fall Risk Fall Risk  03/02/2018 03/02/2018 01/28/2018 01/18/2018 12/25/2016  Falls in the past year? 1 0 1 No No  Number falls in past yr: 0 - 0 - -  Injury with Fall? 1 - - - -   Depression Screen PHQ 2/9 Scores 03/02/2018 03/02/2018 01/28/2018 01/18/2018  PHQ - 2 Score 0 0 0 0  PHQ- 9 Score - - - 0    Cognitive Function       Ad8 score reviewed for issues:  Issues making decisions: no  Less interest in hobbies / activities: no  Repeats questions, stories (family complaining): no  Trouble using ordinary gadgets (microwave, computer, phone):no  Forgets the month or year: no  Mismanaging finances: no  Remembering appts: no  Daily problems with thinking and/or memory: no Ad8 score is= 0  Immunization History  Administered Date(s) Administered  . Influenza Split 01/10/2011  . Influenza Whole 02/18/2010  . Influenza, High Dose Seasonal PF 12/25/2016, 01/04/2018  . Influenza-Unspecified 01/05/2013, 01/23/2015, 02/06/2016  . Pneumococcal Conjugate-13 03/28/2015  . Pneumococcal Polysaccharide-23 04/30/2016  . Tdap 03/24/2004   Screening Tests Health Maintenance  Topic Date  Due  . HEMOGLOBIN A1C  01/24/2018  . TETANUS/TDAP  07/22/2018 (Originally 03/24/2014)  . OPHTHALMOLOGY EXAM  11/11/2018  . FOOT EXAM  03/03/2019  . COLONOSCOPY  09/28/2024  . INFLUENZA VACCINE  Completed  . Hepatitis C Screening  Completed  . PNA vac Low Risk Adult  Completed       Plan:     Bring a copy of your living will and/or healthcare power of attorney to your next office visit.  Continue to eat heart healthy diet (full of fruits, vegetables, whole grains, lean protein, water--limit salt, fat, and sugar intake) and increase physical activity as tolerated.  Continue doing brain stimulating activities (puzzles, reading, adult coloring books, staying active) to keep memory sharp.   I have personally reviewed and noted the following in the patient's chart:   . Medical and social history . Use of alcohol, tobacco or illicit drugs  . Current medications and supplements . Functional ability and status . Nutritional status . Physical activity . Advanced directives . List of other physicians . Vitals . Screenings to include cognitive, depression, and falls . Referrals and appointments  In addition, I have reviewed and discussed with patient certain preventive protocols, quality metrics, and best practice recommendations. A written personalized care plan for preventive services as well as general preventive health recommendations were provided to patient.     Michiel Cowboy, RN  03/02/2018    Medical screening examination/treatment/procedure(s) were performed by non-physician practitioner and as supervising physician I was immediately available for consultation/collaboration. I agree with above. [name], MD

## 2018-03-01 NOTE — Progress Notes (Signed)
Subjective:    Patient ID: Jonathon Armstrong, male    DOB: 05-May-1950, 67 y.o.   MRN: [id number]  HPI The patient is here for follow up.  He is walking three times a week -  2.5 miles-he walks outside.  Diabetes: He is taking his medication daily as prescribed. He is compliant with a diabetic diet. He is exercising regularly.  He checks his feet daily and denies foot lesions. He is up-to-date with an ophthalmology examination.   Hypertension: He is taking his medication daily. He is compliant with a low sodium diet.  He denies chest pain, palpitations, edema, shortness of breath and regular headaches. He is exercising regularly.      Hyperlipidemia: He is taking his medication daily. He is compliant with a low fat/cholesterol diet. He is exercising regularly. He denies myalgias.   His head is still clogged up.  He has seen me, seen an ENT and been to urgent care.  The left side has cleared up but the right side is not 100%.  There is no signs of infection.  He states decreased hearing and the right side feels full.  He denies ear pain, no ear popping or pressure.  He is using the flonase and doing daily sinus irrigations.  He has an ENT follow up this month.    Medications and allergies reviewed with patient and updated if appropriate.  Patient Active Problem List   Diagnosis Date Noted  . Eustachian tube dysfunction, bilateral 01/18/2018  . Bilateral hearing loss 01/18/2018  . Diabetes ([place]) 02/25/2013  . Hyperlipidemia 12/05/2010  . NONSPECIFIC ABNORMAL ELECTROCARDIOGRAM 08/13/2009  . HYPERURICEMIA, ASYMPTOMATIC 02/07/2008  . DIVERTICULOSIS, COLON 10/08/2007  . History of colonic polyps 10/08/2007  . Essential hypertension 03/26/2007  . ASYSTOLE 02/22/2007  . Obstructive sleep apnea 08/28/2006    Current Outpatient Medications on File Prior to Visit  Medication Sig Dispense Refill  . amoxicillin (AMOXIL) 500 MG capsule Take 500 mg by mouth as needed.    Marland Kitchen aspirin (BAYER LOW  STRENGTH) 81 MG EC tablet Take 81 mg by mouth daily.      Marland Kitchen ezetimibe-simvastatin (VYTORIN) 10-20 MG tablet TAKE (1) TABLET DAILY AT BEDTIME. 90 tablet 0  . labetalol (NORMODYNE) 300 MG tablet TAKE (1/2) TABLET TWICE DAILY. 90 tablet 0  . metFORMIN (GLUCOPHAGE) 500 MG tablet TAKE 2 TABLETS TWICE DAILY WITH FOOD. 360 tablet 0  . spironolactone (ALDACTONE) 25 MG tablet TAKE 1 TABLET ONCE DAILY. 90 tablet 2  . telmisartan (MICARDIS) 80 MG tablet TAKE 1 TABLET ONCE DAILY. 90 tablet 0   No current facility-administered medications on file prior to visit.     Past Medical History:  Diagnosis Date  . Allergy    seasonal  . Asystole ([place])    "vagal response"post nasal polypectomy  . Diabetes mellitus without complication ([place])    pre but take metformin  . Diverticulosis of colon   . FH: colonic polyps    hx of  . Hyperlipidemia   . Hypertension   . Mitral regurgitation    mild & aortic Valve calcification on 2d echo;sbe prophylaxis  . Parotitis, acute 2013  . Seasonal rhinitis   . Sleep apnea    CPAP    Past Surgical History:  Procedure Laterality Date  . APPENDECTOMY    . asytole post nasal polypectomy  2008  . colonoscopy with polypectomy  2010   Dr Olevia Perches  . heriorraphy     x2  . HERNIA REPAIR    .  MIDDLE EAR SURGERY     TM replacement  . NASAL SINUS SURGERY  1994, [id number]   X3  . TONSILLECTOMY AND ADENOIDECTOMY      Social History   Socioeconomic History  . Marital status: Married    Spouse name: Not on file  . Number of children: 2  . Years of education: 47  . Highest education level: Not on file  Occupational History  . Occupation: Retired    Fish farm manager: RETIRED  Social Needs  . Financial resource strain: Not hard at all  . Food insecurity:    Worry: Never true    Inability: Never true  . Transportation needs:    Medical: No    Non-medical: No  Tobacco Use  . Smoking status: Never Smoker  . Smokeless tobacco: Never Used  Substance and Sexual Activity   . Alcohol use: Yes    Alcohol/week: 2.0 standard drinks    Types: 1 Glasses of wine, 1 Shots of liquor per week    Comment: socially < 2/ week  . Drug use: No  . Sexual activity: Yes  Lifestyle  . Physical activity:    Days per week: 3 days    Minutes per session: 60 min  . Stress: Not at all  Relationships  . Social connections:    Talks on phone: More than three times a week    Gets together: More than three times a week    Attends religious service: More than 4 times per year    Active member of club or organization: Yes    Attends meetings of clubs or organizations: More than 4 times per year    Relationship status: Married  Other Topics Concern  . Not on file  Social History Narrative   Fun: Water, swimming, [employer]; Psychologist, occupational work    Denies abuse and feels safe at home.   Regular exercise       Family History  Problem Relation Age of Onset  . Cancer Mother        liver &multiple myeloma  . Heart disease Mother        CABG, Angina  . Hyperlipidemia Mother   . Hypertension Father   . Heart disease Father        MI in 77"s  . Dementia Father   . Cancer Maternal Grandfather        bone  . Other Sister        perforated bowel  . Other Sister        cns aneurysm  . Diabetes Neg Hx   . Stroke Neg Hx   . Colon cancer Neg Hx     Review of Systems  Constitutional: Negative for chills and fever.  HENT: Positive for hearing loss. Negative for ear pain.   Respiratory: Positive for cough (occ, dry). Negative for shortness of breath and wheezing.   Cardiovascular: Negative for chest pain, palpitations and leg swelling.  Neurological: Negative for light-headedness and headaches.       Objective:   Vitals:   03/02/18 0922  BP: 132/74  Pulse: 65  Resp: 16  Temp: 98.4 F (36.9 C)  SpO2: 98%   BP Readings from Last 3 Encounters:  03/02/18 132/74  03/02/18 132/74  01/28/18 122/69   Wt Readings from Last 3 Encounters:  03/02/18 222 lb (100.7 kg)    03/02/18 222 lb (100.7 kg)  01/28/18 225 lb 6.4 oz (102.2 kg)   Body mass index is 32.78 kg/m.   Physical  Exam    Constitutional: Appears well-developed and well-nourished. No distress.  HENT:  Head: Normocephalic and atraumatic.  Neck: Bilateral ear canals normal without excessive cerumen or erythema.  Left tympanic membrane normal.  Right tympanic membrane with possible perforation, but no erythema.  Neck supple. No tracheal deviation present. No thyromegaly present.  No cervical lymphadenopathy Cardiovascular: Normal rate, regular rhythm and normal heart sounds.   No murmur heard. No carotid bruit .  No edema Pulmonary/Chest: Effort normal and breath sounds normal. No respiratory distress. No has no wheezes. No rales.  Skin: Skin is warm and dry. Not diaphoretic.  Psychiatric: Normal mood and affect. Behavior is normal.      Assessment & Plan:    See Problem List for Assessment and Plan of chronic medical problems.

## 2018-03-02 ENCOUNTER — Encounter: Payer: Self-pay | Admitting: Internal Medicine

## 2018-03-02 ENCOUNTER — Ambulatory Visit (INDEPENDENT_AMBULATORY_CARE_PROVIDER_SITE_OTHER): Payer: Medicare Other | Admitting: *Deleted

## 2018-03-02 ENCOUNTER — Ambulatory Visit (INDEPENDENT_AMBULATORY_CARE_PROVIDER_SITE_OTHER): Payer: Medicare Other | Admitting: Internal Medicine

## 2018-03-02 ENCOUNTER — Other Ambulatory Visit (INDEPENDENT_AMBULATORY_CARE_PROVIDER_SITE_OTHER): Payer: Medicare Other

## 2018-03-02 VITALS — BP 132/74 | HR 65 | Temp 98.4°F | Resp 17 | Ht 69.0 in | Wt 222.0 lb

## 2018-03-02 VITALS — BP 132/74 | HR 65 | Temp 98.4°F | Resp 16 | Ht 69.0 in | Wt 222.0 lb

## 2018-03-02 DIAGNOSIS — H6981 Other specified disorders of Eustachian tube, right ear: Secondary | ICD-10-CM

## 2018-03-02 DIAGNOSIS — E782 Mixed hyperlipidemia: Secondary | ICD-10-CM

## 2018-03-02 DIAGNOSIS — E119 Type 2 diabetes mellitus without complications: Secondary | ICD-10-CM

## 2018-03-02 DIAGNOSIS — Z0000 Encounter for general adult medical examination without abnormal findings: Secondary | ICD-10-CM | POA: Diagnosis not present

## 2018-03-02 DIAGNOSIS — I10 Essential (primary) hypertension: Secondary | ICD-10-CM | POA: Diagnosis not present

## 2018-03-02 LAB — COMPREHENSIVE METABOLIC PANEL
ALT: 31 U/L (ref 0–53)
AST: 32 U/L (ref 0–37)
Albumin: 4.8 g/dL (ref 3.5–5.2)
Alkaline Phosphatase: 70 U/L (ref 39–117)
BUN: 15 mg/dL (ref 6–23)
CO2: 26 mEq/L (ref 19–32)
Calcium: 10.2 mg/dL (ref 8.4–10.5)
Chloride: 104 mEq/L (ref 96–112)
Creatinine, Ser: 1.17 mg/dL (ref 0.40–1.50)
GFR: 65.77 mL/min (ref >60.00)
Glucose, Bld: 139 mg/dL — ABNORMAL HIGH (ref 70–99)
Potassium: 4.4 mEq/L (ref 3.5–5.1)
Sodium: 139 mEq/L (ref 135–145)
Total Bilirubin: 0.5 mg/dL (ref 0.2–1.2)
Total Protein: 7.3 g/dL (ref 6.0–8.3)

## 2018-03-02 LAB — LIPID PANEL
Cholesterol: 139 mg/dL (ref 0–200)
HDL: 33.3 mg/dL — ABNORMAL LOW (ref >39.00)
NonHDL: 105.22
Total CHOL/HDL Ratio: 4
Triglycerides: 300 mg/dL — ABNORMAL HIGH (ref 0.0–149.0)
VLDL: 60 mg/dL — ABNORMAL HIGH (ref 0.0–40.0)

## 2018-03-02 LAB — HEMOGLOBIN A1C: Hgb A1c MFr Bld: 7.3 % — ABNORMAL HIGH (ref 4.6–6.5)

## 2018-03-02 LAB — LDL CHOLESTEROL, DIRECT: Direct LDL: 75 mg/dL

## 2018-03-02 NOTE — Assessment & Plan Note (Signed)
Check A1c Continue regular activity Work on weight loss Continue current medications for now-can adjust if needed Follow-up in 6 months

## 2018-03-02 NOTE — Patient Instructions (Addendum)
Bring a copy of your living will and/or healthcare power of attorney to your next office visit.  Continue to eat heart healthy diet (full of fruits, vegetables, whole grains, lean protein, water--limit salt, fat, and sugar intake) and increase physical activity as tolerated.  Continue doing brain stimulating activities (puzzles, reading, adult coloring books, staying active) to keep memory sharp.   Jonathon Armstrong , Thank you for taking time to come for your Medicare Wellness Visit. I appreciate your ongoing commitment to your health goals. Please review the following plan we discussed and let me know if I can assist you in the future.   These are the goals we discussed: Goals    . Patient Stated     Continue volunteer within the community. Enjoy life, love family and stay emotionally engaged with myself and others.       This is a list of the screening recommended for you and due dates:  Health Maintenance  Topic Date Due  . Complete foot exam   12/22/2017  . Hemoglobin A1C  01/24/2018  . Tetanus Vaccine  07/22/2018*  . Eye exam for diabetics  11/11/2018  . Colon Cancer Screening  09/28/2024  . Flu Shot  Completed  .  Hepatitis C: One time screening is recommended by Center for Disease Control  (CDC) for  adults born from [birth year] through 1965.   Completed  . Pneumonia vaccines  Completed  *Topic was postponed. The date shown is not the original due date.    Health Maintenance, Male A healthy lifestyle and preventive care is important for your health and wellness. Ask your health care provider about what schedule of regular examinations is right for you. What should I know about weight and diet? Eat a Healthy Diet  Eat plenty of vegetables, fruits, whole grains, low-fat dairy products, and lean protein.  Do not eat a lot of foods high in solid fats, added sugars, or salt.  Maintain a Healthy Weight Regular exercise can help you achieve or maintain a healthy weight. You should:  Do at  least 150 minutes of exercise each week. The exercise should increase your heart rate and make you sweat (moderate-intensity exercise).  Do strength-training exercises at least twice a week.  Watch Your Levels of Cholesterol and Blood Lipids  Have your blood tested for lipids and cholesterol every 5 years starting at 68 years of age. If you are at high risk for heart disease, you should start having your blood tested when you are 67 years old. You may need to have your cholesterol levels checked more often if: ? Your lipid or cholesterol levels are high. ? You are older than 68 years of age. ? You are at high risk for heart disease.  What should I know about cancer screening? Many types of cancers can be detected early and may often be prevented. Lung Cancer  You should be screened every year for lung cancer if: ? You are a current smoker who has smoked for at least 30 years. ? You are a former smoker who has quit within the past 15 years.  Talk to your health care provider about your screening options, when you should start screening, and how often you should be screened.  Colorectal Cancer  Routine colorectal cancer screening usually begins at 68 years of age and should be repeated every 5-10 years until you are 68 years old. You may need to be screened more often if early forms of precancerous polyps or small growths  are found. Your health care provider may recommend screening at an earlier age if you have risk factors for colon cancer.  Your health care provider may recommend using home test kits to check for hidden blood in the stool.  A small camera at the end of a tube can be used to examine your colon (sigmoidoscopy or colonoscopy). This checks for the earliest forms of colorectal cancer.  Prostate and Testicular Cancer  Depending on your age and overall health, your health care provider may do certain tests to screen for prostate and testicular cancer.  Talk to your health  care provider about any symptoms or concerns you have about testicular or prostate cancer.  Skin Cancer  Check your skin from head to toe regularly.  Tell your health care provider about any new moles or changes in moles, especially if: ? There is a change in a mole's size, shape, or color. ? You have a mole that is larger than a pencil eraser.  Always use sunscreen. Apply sunscreen liberally and repeat throughout the day.  Protect yourself by wearing long sleeves, pants, a wide-brimmed hat, and sunglasses when outside.  What should I know about heart disease, diabetes, and high blood pressure?  If you are 81-9 years of age, have your blood pressure checked every 3-5 years. If you are 47 years of age or older, have your blood pressure checked every year. You should have your blood pressure measured twice-once when you are at a hospital or clinic, and once when you are not at a hospital or clinic. Record the average of the two measurements. To check your blood pressure when you are not at a hospital or clinic, you can use: ? An automated blood pressure machine at a pharmacy. ? A home blood pressure monitor.  Talk to your health care provider about your target blood pressure.  If you are between 62-73 years old, ask your health care provider if you should take aspirin to prevent heart disease.  Have regular diabetes screenings by checking your fasting blood sugar level. ? If you are at a normal weight and have a low risk for diabetes, have this test once every three years after the age of 27. ? If you are overweight and have a high risk for diabetes, consider being tested at a younger age or more often.  A one-time screening for abdominal aortic aneurysm (AAA) by ultrasound is recommended for men aged 35-75 years who are current or former smokers. What should I know about preventing infection? Hepatitis B If you have a higher risk for hepatitis B, you should be screened for this virus.  Talk with your health care provider to find out if you are at risk for hepatitis B infection. Hepatitis C Blood testing is recommended for:  Everyone born from [birth year] through 1966.  Anyone with known risk factors for hepatitis C.  Sexually Transmitted Diseases (STDs)  You should be screened each year for STDs including gonorrhea and chlamydia if: ? You are sexually active and are younger than 68 years of age. ? You are older than 68 years of age and your health care provider tells you that you are at risk for this type of infection. ? Your sexual activity has changed since you were last screened and you are at an increased risk for chlamydia or gonorrhea. Ask your health care provider if you are at risk.  Talk with your health care provider about whether you are at high risk of being infected  with HIV. Your health care provider may recommend a prescription medicine to help prevent HIV infection.  What else can I do?  Schedule regular health, dental, and eye exams.  Stay current with your vaccines (immunizations).  Do not use any tobacco products, such as cigarettes, chewing tobacco, and e-cigarettes. If you need help quitting, ask your health care provider.  Limit alcohol intake to no more than 2 drinks per day. One drink equals 12 ounces of beer, 5 ounces of wine, or 1 ounces of hard liquor.  Do not use street drugs.  Do not share needles.  Ask your health care provider for help if you need support or information about quitting drugs.  Tell your health care provider if you often feel depressed.  Tell your health care provider if you have ever been abused or do not feel safe at home. This information is not intended to replace advice given to you by your health care provider. Make sure you discuss any questions you have with your health care provider. Document Released: 09/06/2007 Document Revised: 11/07/2015 Document Reviewed: 12/12/2014 Elsevier Interactive Patient Education   2018 [place] Maintenance, Male A healthy lifestyle and preventive care is important for your health and wellness. Ask your health care provider about what schedule of regular examinations is right for you. What should I know about weight and diet? Eat a Healthy Diet  Eat plenty of vegetables, fruits, whole grains, low-fat dairy products, and lean protein.  Do not eat a lot of foods high in solid fats, added sugars, or salt.  Maintain a Healthy Weight Regular exercise can help you achieve or maintain a healthy weight. You should:  Do at least 150 minutes of exercise each week. The exercise should increase your heart rate and make you sweat (moderate-intensity exercise).  Do strength-training exercises at least twice a week.  Watch Your Levels of Cholesterol and Blood Lipids  Have your blood tested for lipids and cholesterol every 5 years starting at 68 years of age. If you are at high risk for heart disease, you should start having your blood tested when you are 68 years old. You may need to have your cholesterol levels checked more often if: ? Your lipid or cholesterol levels are high. ? You are older than 68 years of age. ? You are at high risk for heart disease.  What should I know about cancer screening? Many types of cancers can be detected early and may often be prevented. Lung Cancer  You should be screened every year for lung cancer if: ? You are a current smoker who has smoked for at least 30 years. ? You are a former smoker who has quit within the past 15 years.  Talk to your health care provider about your screening options, when you should start screening, and how often you should be screened.  Colorectal Cancer  Routine colorectal cancer screening usually begins at 68 years of age and should be repeated every 5-10 years until you are 68 years old. You may need to be screened more often if early forms of precancerous polyps or small growths are found.  Your health care provider may recommend screening at an earlier age if you have risk factors for colon cancer.  Your health care provider may recommend using home test kits to check for hidden blood in the stool.  A small camera at the end of a tube can be used to examine your colon (sigmoidoscopy or colonoscopy). This checks  for the earliest forms of colorectal cancer.  Prostate and Testicular Cancer  Depending on your age and overall health, your health care provider may do certain tests to screen for prostate and testicular cancer.  Talk to your health care provider about any symptoms or concerns you have about testicular or prostate cancer.  Skin Cancer  Check your skin from head to toe regularly.  Tell your health care provider about any new moles or changes in moles, especially if: ? There is a change in a mole's size, shape, or color. ? You have a mole that is larger than a pencil eraser.  Always use sunscreen. Apply sunscreen liberally and repeat throughout the day.  Protect yourself by wearing long sleeves, pants, a wide-brimmed hat, and sunglasses when outside.  What should I know about heart disease, diabetes, and high blood pressure?  If you are 110-32 years of age, have your blood pressure checked every 3-5 years. If you are 40 years of age or older, have your blood pressure checked every year. You should have your blood pressure measured twice-once when you are at a hospital or clinic, and once when you are not at a hospital or clinic. Record the average of the two measurements. To check your blood pressure when you are not at a hospital or clinic, you can use: ? An automated blood pressure machine at a pharmacy. ? A home blood pressure monitor.  Talk to your health care provider about your target blood pressure.  If you are between 76-38 years old, ask your health care provider if you should take aspirin to prevent heart disease.  Have regular diabetes screenings by  checking your fasting blood sugar level. ? If you are at a normal weight and have a low risk for diabetes, have this test once every three years after the age of 34. ? If you are overweight and have a high risk for diabetes, consider being tested at a younger age or more often.  A one-time screening for abdominal aortic aneurysm (AAA) by ultrasound is recommended for men aged 24-75 years who are current or former smokers. What should I know about preventing infection? Hepatitis B If you have a higher risk for hepatitis B, you should be screened for this virus. Talk with your health care provider to find out if you are at risk for hepatitis B infection. Hepatitis C Blood testing is recommended for:  Everyone born from [birth year] through 1966.  Anyone with known risk factors for hepatitis C.  Sexually Transmitted Diseases (STDs)  You should be screened each year for STDs including gonorrhea and chlamydia if: ? You are sexually active and are younger than 68 years of age. ? You are older than 68 years of age and your health care provider tells you that you are at risk for this type of infection. ? Your sexual activity has changed since you were last screened and you are at an increased risk for chlamydia or gonorrhea. Ask your health care provider if you are at risk.  Talk with your health care provider about whether you are at high risk of being infected with HIV. Your health care provider may recommend a prescription medicine to help prevent HIV infection.  What else can I do?  Schedule regular health, dental, and eye exams.  Stay current with your vaccines (immunizations).  Do not use any tobacco products, such as cigarettes, chewing tobacco, and e-cigarettes. If you need help quitting, ask your health care provider.  Limit  alcohol intake to no more than 2 drinks per day. One drink equals 12 ounces of beer, 5 ounces of wine, or 1 ounces of hard liquor.  Do not use street drugs.  Do not  share needles.  Ask your health care provider for help if you need support or information about quitting drugs.  Tell your health care provider if you often feel depressed.  Tell your health care provider if you have ever been abused or do not feel safe at home. This information is not intended to replace advice given to you by your health care provider. Make sure you discuss any questions you have with your health care provider. Document Released: 09/06/2007 Document Revised: 11/07/2015 Document Reviewed: 12/12/2014 Elsevier Interactive Patient Education  [employer].

## 2018-03-02 NOTE — Assessment & Plan Note (Signed)
BP well controlled Current regimen effective and well tolerated Continue current medications at current doses cmp  

## 2018-03-02 NOTE — Patient Instructions (Addendum)
  Tests ordered today. Your results will be released to MyChart (or called to you) after review, usually within 72hours after test completion. If any changes need to be made, you will be notified at that same time.  Medications reviewed and updated.  Changes include :   none      Please followup in 6 months   

## 2018-03-02 NOTE — Assessment & Plan Note (Signed)
Check lipid panel  Continue Vytorin Regular exercise and healthy diet encouraged

## 2018-03-02 NOTE — Assessment & Plan Note (Signed)
He is still having symptoms of eustachian tube dysfunction on the right He has done very sinus rinses, Flonase and completed the prednisone 1 prescribed He has a follow-up with ENT later this month for further evaluation and treatment I do not recommend anything additional at this time until he sees ENT

## 2018-03-03 ENCOUNTER — Encounter: Payer: Self-pay | Admitting: Internal Medicine

## 2018-03-10 DIAGNOSIS — H900 Conductive hearing loss, bilateral: Secondary | ICD-10-CM | POA: Diagnosis not present

## 2018-03-10 DIAGNOSIS — H9190 Unspecified hearing loss, unspecified ear: Secondary | ICD-10-CM | POA: Diagnosis not present

## 2018-03-10 DIAGNOSIS — H6983 Other specified disorders of Eustachian tube, bilateral: Secondary | ICD-10-CM | POA: Diagnosis not present

## 2018-03-10 DIAGNOSIS — H65491 Other chronic nonsuppurative otitis media, right ear: Secondary | ICD-10-CM | POA: Diagnosis not present

## 2018-03-15 DIAGNOSIS — H6691 Otitis media, unspecified, right ear: Secondary | ICD-10-CM | POA: Diagnosis not present

## 2018-03-23 ENCOUNTER — Encounter: Payer: Self-pay | Admitting: Internal Medicine

## 2018-03-23 ENCOUNTER — Ambulatory Visit (INDEPENDENT_AMBULATORY_CARE_PROVIDER_SITE_OTHER)
Admission: RE | Admit: 2018-03-23 | Discharge: 2018-03-23 | Disposition: A | Discharge status: Home / Self Care | Payer: Medicare Other | Source: Ambulatory Visit | Attending: Internal Medicine | Admitting: Internal Medicine

## 2018-03-23 ENCOUNTER — Ambulatory Visit (INDEPENDENT_AMBULATORY_CARE_PROVIDER_SITE_OTHER): Payer: Medicare Other | Admitting: Internal Medicine

## 2018-03-23 VITALS — BP 128/86 | HR 75 | Temp 99.3°F | Ht 69.0 in | Wt 224.0 lb

## 2018-03-23 DIAGNOSIS — R05 Cough: Secondary | ICD-10-CM

## 2018-03-23 DIAGNOSIS — R059 Cough, unspecified: Secondary | ICD-10-CM

## 2018-03-23 LAB — POCT EXHALED NITRIC OXIDE: FeNO level (ppb): 51

## 2018-03-23 MED ORDER — PREDNISONE 20 MG PO TABS: 40.0000 mg | ORAL_TABLET | Freq: Every day | ORAL | 0 refills | Status: DC

## 2018-03-23 NOTE — Progress Notes (Signed)
   Subjective:   Patient ID: Jonathon Armstrong, male    DOB: Jul 15, 1950, 67 y.o.   MRN: [id number]  HPI The patient is a 67 y.o. man coming in for cold and ear symptoms. Started about 3 months ago and has never resolved. Did take a plane trip and on the return his right ear clogged. Pain and hearing loss. Just got tubes about 1 week ago. Also with cough and sinus symptoms off and on. Worse in the last week. Main symptoms are: cough congestion. Denies SOB or fevers or chills. Overall it is worsening. Has tried tubes, allergy medication, sinus rinses, nose sprays. Is seeing ENT and just got ear tube on the right placed 03/15/18. Is concerned he has bronchitis or pneumonia with the cough. Sleeping okay.   Review of Systems  Constitutional: Positive for appetite change. Negative for activity change, chills, fatigue, fever and unexpected weight change.  HENT: Positive for congestion, ear pain, postnasal drip, rhinorrhea, sinus pressure and sore throat. Negative for ear discharge, sinus pain, sneezing, tinnitus, trouble swallowing and voice change.   Eyes: Negative.   Respiratory: Positive for cough. Negative for chest tightness, shortness of breath and wheezing.   Cardiovascular: Negative.   Gastrointestinal: Negative.   Musculoskeletal: Positive for myalgias.  Neurological: Negative.     Objective:  Physical Exam Constitutional:      Appearance: He is well-developed.  HENT:     Head: Normocephalic and atraumatic.     Comments: Oropharynx with redness and clear drainage, nose with swollen turbinates, TMs normal left    Ears:     Comments: Right ear tube in place and open Neck:     Musculoskeletal: Normal range of motion.     Thyroid: No thyromegaly.  Cardiovascular:     Rate and Rhythm: Normal rate and regular rhythm.  Pulmonary:     Effort: Pulmonary effort is normal. No respiratory distress.     Breath sounds: Normal breath sounds. No wheezing or rales.  Abdominal:     Palpations:  Abdomen is soft.  Musculoskeletal:        General: Tenderness present.  Lymphadenopathy:     Cervical: No cervical adenopathy.  Skin:    General: Skin is warm and dry.  Neurological:     Mental Status: He is alert and oriented to person, place, and time.     Vitals:   03/23/18 1201  BP: 128/86  Pulse: 75  Temp: 99.3 F (37.4 C)  TempSrc: Oral  SpO2: 96%  Weight: 224 lb (101.6 kg)  Height: 5\' 9"  (1.753 m)   FENO: 51  Assessment & Plan:  Visit time 25 minutes: greater than 50% of that time was spent in face to face counseling and coordination of care with the patient: counseled about etiology, time course as well as possible causes also with treatment options

## 2018-03-23 NOTE — Assessment & Plan Note (Signed)
FENO done in the office 51 which indicates steroid responsiveness. Rx for prednisone burst and CXR done today given course. Ear tube is still draining well and likely allergic rhinitis is playing into cough and could be component of UACS as well.

## 2018-03-23 NOTE — Patient Instructions (Signed)
We have sent in prednisone to take 2 pills daily for 5 days to help clear the cough.   We are also checking the chest x-ray and if any findings will call in an antibiotic.

## 2018-03-29 ENCOUNTER — Encounter: Payer: Self-pay | Admitting: Internal Medicine

## 2018-03-29 ENCOUNTER — Ambulatory Visit (INDEPENDENT_AMBULATORY_CARE_PROVIDER_SITE_OTHER): Payer: Medicare Other | Admitting: Internal Medicine

## 2018-03-29 VITALS — BP 132/88 | HR 61 | Temp 98.3°F | Resp 16 | Ht 69.0 in | Wt 221.0 lb

## 2018-03-29 DIAGNOSIS — R059 Cough, unspecified: Secondary | ICD-10-CM

## 2018-03-29 DIAGNOSIS — R05 Cough: Secondary | ICD-10-CM

## 2018-03-29 MED ORDER — HYDROCOD POLST-CPM POLST ER 10-8 MG/5ML PO SUER: 5.0000 mL | Freq: Two times a day (BID) | ORAL | 0 refills | Status: DC | PRN

## 2018-03-29 NOTE — Progress Notes (Signed)
Subjective:    Patient ID: Jonathon Armstrong, male    DOB: 21-May-1950, 68 y.o.   MRN: [id number]  HPI The patient is here for an acute visit.   He continues to have cough that occurs in spasms.  It can be worse at night.  He denies SOB and is unsure if he has any wheeze - his wife has heard wheeze at times.  The cough seems to be worse with moving his diaphragm - like when he goes to stand up.  He is more fatigued and has been napping.  He does clear his throat a lot. He was recently prescribed.  -  He saw Dr Sharlet Salina last week and was prescribed Prednisone which did not help.    He is also seeing ENT for his R ETD and has a tube in the ear.  When he saw her he did not have a sinus infection.  He did discuss the cough with them.    He denies GERD.   Medications and allergies reviewed with patient and updated if appropriate.  Patient Active Problem List   Diagnosis Date Noted  . Chronic Eustachian tube dysfunction, right 01/18/2018  . Bilateral hearing loss 01/18/2018  . Cough 06/15/2015  . Diabetes ([place]) 02/25/2013  . Hyperlipidemia 12/05/2010  . NONSPECIFIC ABNORMAL ELECTROCARDIOGRAM 08/13/2009  . HYPERURICEMIA, ASYMPTOMATIC 02/07/2008  . DIVERTICULOSIS, COLON 10/08/2007  . History of colonic polyps 10/08/2007  . Essential hypertension 03/26/2007  . ASYSTOLE 02/22/2007  . Obstructive sleep apnea 08/28/2006    Current Outpatient Medications on File Prior to Visit  Medication Sig Dispense Refill  . amoxicillin (AMOXIL) 500 MG capsule Take 500 mg by mouth as needed.    Marland Kitchen aspirin (BAYER LOW STRENGTH) 81 MG EC tablet Take 81 mg by mouth daily.      Marland Kitchen ezetimibe-simvastatin (VYTORIN) 10-20 MG tablet TAKE (1) TABLET DAILY AT BEDTIME. 90 tablet 0  . labetalol (NORMODYNE) 300 MG tablet TAKE (1/2) TABLET TWICE DAILY. 90 tablet 0  . metFORMIN (GLUCOPHAGE) 500 MG tablet TAKE 2 TABLETS TWICE DAILY WITH FOOD. 360 tablet 0  . spironolactone (ALDACTONE) 25 MG tablet TAKE 1 TABLET ONCE DAILY.  90 tablet 2  . telmisartan (MICARDIS) 80 MG tablet TAKE 1 TABLET ONCE DAILY. 90 tablet 0   No current facility-administered medications on file prior to visit.     Past Medical History:  Diagnosis Date  . Allergy    seasonal  . Asystole ([place])    "vagal response"post nasal polypectomy  . Diabetes mellitus without complication ([place])    pre but take metformin  . Diverticulosis of colon   . FH: colonic polyps    hx of  . Hyperlipidemia   . Hypertension   . Mitral regurgitation    mild & aortic Valve calcification on 2d echo;sbe prophylaxis  . Parotitis, acute 2013  . Seasonal rhinitis   . Sleep apnea    CPAP    Past Surgical History:  Procedure Laterality Date  . APPENDECTOMY    . asytole post nasal polypectomy  2008  . colonoscopy with polypectomy  2010   Dr Olevia Perches  . heriorraphy     x2  . HERNIA REPAIR    . MIDDLE EAR SURGERY     TM replacement  . NASAL SINUS SURGERY  1994, 9977,4142   X3  . TONSILLECTOMY AND ADENOIDECTOMY      Social History   Socioeconomic History  . Marital status: Married    Spouse name: Not on  file  . Number of children: 2  . Years of education: 44  . Highest education level: Not on file  Occupational History  . Occupation: Retired    Fish farm manager: RETIRED  Social Needs  . Financial resource strain: Not hard at all  . Food insecurity:    Worry: Never true    Inability: Never true  . Transportation needs:    Medical: No    Non-medical: No  Tobacco Use  . Smoking status: Never Smoker  . Smokeless tobacco: Never Used  Substance and Sexual Activity  . Alcohol use: Yes    Alcohol/week: 2.0 standard drinks    Types: 1 Glasses of wine, 1 Shots of liquor per week    Comment: socially < 2/ week  . Drug use: No  . Sexual activity: Yes  Lifestyle  . Physical activity:    Days per week: 3 days    Minutes per session: 60 min  . Stress: Not at all  Relationships  . Social connections:    Talks on phone: More than three times a week     Gets together: More than three times a week    Attends religious service: More than 4 times per year    Active member of club or organization: Yes    Attends meetings of clubs or organizations: More than 4 times per year    Relationship status: Married  Other Topics Concern  . Not on file  Social History Narrative   Fun: Water, swimming, [employer]; Psychologist, occupational work    Denies abuse and feels safe at home.   Regular exercise       Family History  Problem Relation Age of Onset  . Cancer Mother        liver &multiple myeloma  . Heart disease Mother        CABG, Angina  . Hyperlipidemia Mother   . Hypertension Father   . Heart disease Father        MI in 32"s  . Dementia Father   . Cancer Maternal Grandfather        bone  . Other Sister        perforated bowel  . Other Sister        cns aneurysm  . Diabetes Neg Hx   . Stroke Neg Hx   . Colon cancer Neg Hx     Review of Systems  Constitutional: Negative for fever.  HENT: Positive for congestion (mild at times). Negative for ear pain, sinus pain and sore throat.   Respiratory: Positive for cough and wheezing (? occ, mild). Negative for shortness of breath.   Cardiovascular: Negative for chest pain.  Gastrointestinal: Negative for nausea.       No gerd  Neurological: Negative for headaches.       Objective:   Vitals:   03/29/18 1352  BP: 132/88  Pulse: 61  Resp: 16  Temp: 98.3 F (36.8 C)  SpO2: 97%   BP Readings from Last 3 Encounters:  03/29/18 132/88  03/23/18 128/86  03/02/18 132/74   Wt Readings from Last 3 Encounters:  03/29/18 221 lb (100.2 kg)  03/23/18 224 lb (101.6 kg)  03/02/18 222 lb (100.7 kg)   Body mass index is 32.64 kg/m.   Physical Exam    GENERAL APPEARANCE: Appears stated age, well appearing, NAD EYES: conjunctiva clear, no icterus HEENT: bilateral tympanic membranes, except tube in right TM - clear,  ear canals normal, oropharynx with no erythema, no thyromegaly, trachea  midline,  no cervical or supraclavicular lymphadenopathy LUNGS: Clear to auscultation without wheeze or crackles, unlabored breathing, good air entry bilaterally CARDIOVASCULAR: Normal S1,S2 without murmurs, no edema SKIN: Warm, dry      Assessment & Plan:    See Problem List for Assessment and Plan of chronic medical problems.

## 2018-03-29 NOTE — Patient Instructions (Addendum)
Try the cough syrup for your cough.   Discuss the cough with your ENT.   If the cough persists we can consider changing your BP medication - telmisartan.

## 2018-03-29 NOTE — Assessment & Plan Note (Signed)
Dry cough - exam is normal Denies PND, ENT has evaluated for a sinus infection - no infection Denies GERD, but may have silent GERD ? ARB induced cough - discussed changing telmisartan but he prefers to hold off on that for now Would like cough syrup - tussionex prn Referred to pulmonary

## 2018-04-02 ENCOUNTER — Encounter: Payer: Self-pay | Admitting: Internal Medicine

## 2018-04-13 DIAGNOSIS — H90A11 Conductive hearing loss, unilateral, right ear with restricted hearing on the contralateral side: Secondary | ICD-10-CM | POA: Diagnosis not present

## 2018-04-13 DIAGNOSIS — H9190 Unspecified hearing loss, unspecified ear: Secondary | ICD-10-CM | POA: Diagnosis not present

## 2018-04-13 DIAGNOSIS — H90A32 Mixed conductive and sensorineural hearing loss, unilateral, left ear with restricted hearing on the contralateral side: Secondary | ICD-10-CM | POA: Diagnosis not present

## 2018-04-13 DIAGNOSIS — H6983 Other specified disorders of Eustachian tube, bilateral: Secondary | ICD-10-CM | POA: Diagnosis not present

## 2018-04-16 ENCOUNTER — Ambulatory Visit (INDEPENDENT_AMBULATORY_CARE_PROVIDER_SITE_OTHER): Payer: Medicare Other | Admitting: Pulmonary Disease

## 2018-04-16 ENCOUNTER — Encounter: Payer: Self-pay | Admitting: Pulmonary Disease

## 2018-04-16 VITALS — BP 122/72 | HR 62 | Ht 70.0 in | Wt 218.0 lb

## 2018-04-16 DIAGNOSIS — R05 Cough: Secondary | ICD-10-CM

## 2018-04-16 DIAGNOSIS — G4733 Obstructive sleep apnea (adult) (pediatric): Secondary | ICD-10-CM | POA: Diagnosis not present

## 2018-04-16 DIAGNOSIS — R059 Cough, unspecified: Secondary | ICD-10-CM

## 2018-04-16 LAB — POCT EXHALED NITRIC OXIDE: FeNO level (ppb): 38

## 2018-04-16 MED ORDER — ALBUTEROL SULFATE HFA 108 (90 BASE) MCG/ACT IN AERS: 2.0000 | INHALATION_SPRAY | Freq: Four times a day (QID) | RESPIRATORY_TRACT | 5 refills | Status: DC | PRN

## 2018-04-16 MED ORDER — FLUTICASONE FUROATE 100 MCG/ACT IN AEPB: 1.0000 | INHALATION_SPRAY | Freq: Every day | RESPIRATORY_TRACT | 5 refills | Status: DC

## 2018-04-16 NOTE — Patient Instructions (Signed)
Arnuity one puff daily, and rinse mouth after each use Albuterol two puffs every 6 hours as needed for cough, wheeze, or chest congestion Will arrange for auto CPAP set up  Follow up in 2 weeks with Dr. Halford Chessman or Nurse Practitioner to assess cough

## 2018-04-16 NOTE — Progress Notes (Signed)
[facility]  Chief Complaint  Patient presents with  . Follow-up    Pt has deep dry cough, cannot get rid of it.     Constitutional:  BP 122/72 (BP Location: Left Arm, Cuff Size: Normal)   Pulse 62   Ht 5\' 10"  (1.778 m)   Wt 218 lb (98.9 kg)   SpO2 97%   BMI 31.28 kg/m   Past Medical History:  HTN, HLD, Diverticulosis, DM  Brief Summary:  Jonathon Armstrong is a 68 y.o. male with cough and obstructive sleep apnea.  He had home sleep study in October.  Mild sleep apnea.    Around that time he developed a virus after travelling to [place].  He was treated with 2 rounds of prednisone.  He has some improvement.  He had FeNO that was elevated.  His cough still persists.  He gets coughing episode 1 or 2 times per day.  Not having wheeze, sputum, chest pain, reflux, or fever.  Had ENT assessment recently and has tube in his right ear.   Physical Exam:   Appearance - well kempt   ENMT - clear nasal mucosa, midline nasal  septum, no oral exudates, no LAN, trachea midline  Respiratory - normal chest wall, normal respiratory effort, no accessory muscle use, no wheeze/rales  CV - s1s2 regular rate and rhythm, no murmurs, no peripheral edema, radial pulses symmetric  GI - soft, non tender, no masses  Lymph - no adenopathy noted in neck and axillary areas  MSK - normal gait  Ext - no cyanosis, clubbing, or joint inflammation noted  Skin - no rashes, lesions, or ulcers  Neuro - normal strength, oriented x 3  Psych - normal mood and affect  CXR 03/23/18 (reviewed by me) normal   Assessment/Plan:   Cough likely from asthma. - will add arnuity and prn albuterol  Obstructive sleep apnea. - reviewed his sleep study with him - treatment options discussed - will arrange for auto CPAP set up   Patient Instructions  Arnuity one puff daily, and rinse mouth after each use Albuterol two puffs every 6 hours as needed for cough, wheeze, or  chest congestion Will arrange for auto CPAP set up  Follow up in 2 weeks with Dr. Halford Chessman or Nurse Practitioner to assess cough    Chesley Mires, MD [place] Pager: [phone] 04/16/2018, 3:54 PM  Flow Sheet    Pulmonary tests:  FeNO 04/16/18 >> 38 Spirometry 04/16/18 >> FEV1 1.9 (57%), FEV1% 35  Sleep tests:  HST 01/04/18 >> AHI 7.5, SpO2 low 87%  Medications:   Allergies as of 04/16/2018      Reactions   Diltiazem Hcl    ? symptoms   Penicillins    Rash with Amoxicillin with Mono in college      Medication List       Accurate as of April 16, 2018  3:54 PM. Always use your most recent med list.        albuterol 108 (90 Base) MCG/ACT inhaler Commonly known as:  VENTOLIN HFA Inhale 2 puffs into the lungs every 6 (six) hours as needed for wheezing or shortness of breath.   amoxicillin 500 MG capsule Commonly known as:  AMOXIL Take 500 mg by mouth as needed.   BAYER LOW STRENGTH 81 MG EC tablet Generic drug:  aspirin Take 81 mg by mouth daily.   chlorpheniramine-HYDROcodone 10-8 MG/5ML Suer Commonly known as:  Cathie Hoops ER Take 5  mLs by mouth every 12 (twelve) hours as needed for cough.   ezetimibe-simvastatin 10-20 MG tablet Commonly known as:  VYTORIN TAKE (1) TABLET DAILY AT BEDTIME.   Fluticasone Furoate 100 MCG/ACT Aepb Commonly known as:  ARNUITY ELLIPTA Inhale 1 puff into the lungs daily.   labetalol 300 MG tablet Commonly known as:  NORMODYNE TAKE (1/2) TABLET TWICE DAILY.   metFORMIN 500 MG tablet Commonly known as:  GLUCOPHAGE TAKE 2 TABLETS TWICE DAILY WITH FOOD.   spironolactone 25 MG tablet Commonly known as:  ALDACTONE TAKE 1 TABLET ONCE DAILY.   telmisartan 80 MG tablet Commonly known as:  MICARDIS TAKE 1 TABLET ONCE DAILY.       Past Surgical History:  He  has a past surgical history that includes Nasal sinus surgery (1994, 2956,2130); heriorraphy; Middle ear surgery; Appendectomy; asytole  post nasal polypectomy (2008); Tonsillectomy and adenoidectomy; colonoscopy with polypectomy (2010); and Hernia repair.  Family History:  His family history includes Cancer in his maternal grandfather and mother; Dementia in his father; Heart disease in his father and mother; Hyperlipidemia in his mother; Hypertension in his father; Other in his sister and sister.  Social History:  He  reports that he has never smoked. He has never used smokeless tobacco. He reports current alcohol use of about 2.0 standard drinks of alcohol per week. He reports that he does not use drugs.

## 2018-05-01 ENCOUNTER — Other Ambulatory Visit: Payer: Self-pay | Admitting: Internal Medicine

## 2018-05-03 ENCOUNTER — Ambulatory Visit (INDEPENDENT_AMBULATORY_CARE_PROVIDER_SITE_OTHER): Payer: Medicare Other | Admitting: Adult Health

## 2018-05-03 ENCOUNTER — Encounter: Payer: Self-pay | Admitting: Adult Health

## 2018-05-03 DIAGNOSIS — R059 Cough, unspecified: Secondary | ICD-10-CM

## 2018-05-03 DIAGNOSIS — R05 Cough: Secondary | ICD-10-CM | POA: Diagnosis not present

## 2018-05-03 DIAGNOSIS — G4733 Obstructive sleep apnea (adult) (pediatric): Secondary | ICD-10-CM | POA: Diagnosis not present

## 2018-05-03 NOTE — Progress Notes (Signed)
@Patient  ID: Jonathon Armstrong, male    DOB: 23-Oct-1950, 68 y.o.   MRN: [id number]  Chief Complaint  Patient presents with  . Follow-up    Cough    Referring provider: [name], MD  HPI: 68 year old male never smoker followed for obstructive sleep apnea and cough variant asthma.  Diagnosed with sleep apnea initially in the early 2000's at [facility]  He has been on CPAP since then. Medical history significant for hypertension, diabetes, hyperlipidemia  TEST/EVENTS :  FeNO 04/16/18 >> 38 Spirometry 04/16/18 >> FEV1 1.9 (57%), FEV1% 35 HST 01/04/18 >> AHI 7.5, SpO2 low 87%.  05/03/2018 Follow up : OSA and Cough Variant Asthma  Patient returns for a 3-week follow-up.  Patient has underlying mild sleep apnea.  Recent repeat home sleep study showed mild sleep apnea.  Patient's been on CPAP for many years.  He says he continues on CPAP.  He never misses a night.  Download since he received his machine last week  shows excellent compliance and usage (new machine) AHI 2.7. He feels rested and feels that he benefits from CPAP.  Recently got a new CPAP machine few days ago, it is working very well. Denies any significant daytime sleepiness.  Last visit patient was complaining of persistent cough for 3 months after he developed an upper respiratory infection in October after traveling to [place].  Exhaled nitric oxide testing was elevated at 38ppb .  He was felt to have some underlying cough variant asthma.  He was started on Arnuity. Feels that his cough is much better,  almost completely resolved.. no Rescue inhaler use.  He is active and back to baseline .   He is also has some eustachian tube dysfunction.  Has an ear tube in his right ear.  Is following with ENT.. Says overall this is improving    Allergies  Allergen Reactions  . Diltiazem Hcl     ? symptoms  . Penicillins     Rash with Amoxicillin with Mono in college    Immunization History  Administered Date(s)  Administered  . Influenza Split 01/10/2011  . Influenza Whole 02/18/2010  . Influenza, High Dose Seasonal PF 12/25/2016, 01/04/2018  . Influenza-Unspecified 01/05/2013, 01/23/2015, 02/06/2016  . Pneumococcal Conjugate-13 03/28/2015  . Pneumococcal Polysaccharide-23 04/30/2016  . Tdap 03/24/2004    Past Medical History:  Diagnosis Date  . Allergy    seasonal  . Asystole ([place])    "vagal response"post nasal polypectomy  . Diabetes mellitus without complication ([place])    pre but take metformin  . Diverticulosis of colon   . FH: colonic polyps    hx of  . Hyperlipidemia   . Hypertension   . Mitral regurgitation    mild & aortic Valve calcification on 2d echo;sbe prophylaxis  . Parotitis, acute 2013  . Seasonal rhinitis   . Sleep apnea    CPAP    Tobacco History: Social History   Tobacco Use  Smoking Status Never Smoker  Smokeless Tobacco Never Used   Counseling given: Not Answered   Outpatient Medications Prior to Visit  Medication Sig Dispense Refill  . albuterol (VENTOLIN HFA) 108 (90 Base) MCG/ACT inhaler Inhale 2 puffs into the lungs every 6 (six) hours as needed for wheezing or shortness of breath. 1 Inhaler 5  . aspirin (BAYER LOW STRENGTH) 81 MG EC tablet Take 81 mg by mouth daily.      Marland Kitchen ezetimibe-simvastatin (VYTORIN) 10-20 MG tablet TAKE (1) TABLET DAILY AT BEDTIME.  90 tablet 1  . Fluticasone Furoate (ARNUITY ELLIPTA) 100 MCG/ACT AEPB Inhale 1 puff into the lungs daily. 30 each 5  . labetalol (NORMODYNE) 300 MG tablet TAKE (1/2) TABLET TWICE DAILY. 90 tablet 0  . metFORMIN (GLUCOPHAGE) 500 MG tablet TAKE 2 TABLETS TWICE DAILY WITH FOOD. 360 tablet 0  . spironolactone (ALDACTONE) 25 MG tablet TAKE 1 TABLET ONCE DAILY. 90 tablet 2  . telmisartan (MICARDIS) 80 MG tablet TAKE 1 TABLET ONCE DAILY. 90 tablet 0  . amoxicillin (AMOXIL) 500 MG capsule Take 500 mg by mouth as needed.    . chlorpheniramine-HYDROcodone (TUSSIONEX PENNKINETIC ER) 10-8 MG/5ML SUER Take 5 mLs  by mouth every 12 (twelve) hours as needed for cough. (Patient not taking: Reported on 05/03/2018) 100 mL 0   No facility-administered medications prior to visit.      Review of Systems:   Constitutional:   No  weight loss, night sweats,  Fevers, chills, fatigue, or  lassitude.  HEENT:   No headaches,  Difficulty swallowing,  Tooth/dental problems, or  Sore throat,                No sneezing, itching, ear ache, nasal congestion, post nasal drip,   CV:  No chest pain,  Orthopnea, PND, swelling in lower extremities, anasarca, dizziness, palpitations, syncope.   GI  No heartburn, indigestion, abdominal pain, nausea, vomiting, diarrhea, change in bowel habits, loss of appetite, bloody stools.   Resp: No shortness of breath with exertion or at rest.  No excess mucus, no productive cough,  No non-productive cough,  No coughing up of blood.  No change in color of mucus.  No wheezing.  No chest wall deformity  Skin: no rash or lesions.  GU: no dysuria, change in color of urine, no urgency or frequency.  No flank pain, no hematuria   MS:  No joint pain or swelling.  No decreased range of motion.  No back pain.    Physical Exam  BP 122/76 (BP Location: Left Arm, Cuff Size: Normal)   Pulse 65   Ht 5' 9.5" (1.765 m)   Wt 218 lb 9.6 oz (99.2 kg)   SpO2 98%   BMI 31.82 kg/m   GEN: A/Ox3; pleasant , NAD, well nourished    HEENT:  [place]/AT,  EACs-clear, Right tube , TMs-wnl, NOSE-clear, THROAT-clear, no lesions, no postnasal drip or exudate noted.   NECK:  Supple w/ fair ROM; no JVD; normal carotid impulses w/o bruits; no thyromegaly or nodules palpated; no lymphadenopathy.    RESP  Clear  P & A; w/o, wheezes/ rales/ or rhonchi. no accessory muscle use, no dullness to percussion  CARD:  RRR, no m/r/g, no peripheral edema, pulses intact, no cyanosis or clubbing.  GI:   Soft & nt; nml bowel sounds; no organomegaly or masses detected.   Musco: Warm bil, no deformities or joint swelling  noted.   Neuro: alert, no focal deficits noted.    Skin: Warm, no lesions or rashes    Lab Results:  CBC    Component Value Date/Time   WBC 8.4 07/24/2017 0902   RBC 4.62 07/24/2017 0902   HGB 14.2 07/24/2017 0902   HCT 40.8 07/24/2017 0902   PLT 192.0 07/24/2017 0902   MCV 88.3 07/24/2017 0902   MCHC 34.7 07/24/2017 0902   RDW 13.4 07/24/2017 0902   LYMPHSABS 1.8 07/24/2017 0902   MONOABS 0.6 07/24/2017 0902   EOSABS 0.1 07/24/2017 0902   BASOSABS 0.0 07/24/2017 0902  BMET    Component Value Date/Time   NA 139 03/02/2018 1135   K 4.4 03/02/2018 1135   CL 104 03/02/2018 1135   CO2 26 03/02/2018 1135   GLUCOSE 139 (H) 03/02/2018 1135   BUN 15 03/02/2018 1135   CREATININE 1.17 03/02/2018 1135   CALCIUM 10.2 03/02/2018 1135   GFRNONAA 72.56 08/16/2009 0853   GFRAA 73 10/08/2007 0000    BNP No results found for: BNP  ProBNP No results found for: PROBNP  Imaging: No results found.    No flowsheet data found.  No results found for: NITRICOXIDE      Assessment & Plan:   Obstructive sleep apnea Doing well on CPAP with excellent compliance and control  Plan Continue on CPAP at bedtime Work on healthy weight Do not drive if sleepy Follow-up in 4 to 6 months and as needed   Cough Suspected cough variant asthma improved on Arnuity -continue current regimen and trigger prevention   Plan   Patient Instructions  Continue on CPAP at bedtime Keep up the good work Continue to work on [employer] Do not drive if sleepy  Continue on Arnuity 1 puff daily, rinse after use  Follow-up with Dr. Halford Chessman or Jailine Lieder NP in 2-3  months and As needed          Rexene Edison, NP 05/03/2018

## 2018-05-03 NOTE — Assessment & Plan Note (Signed)
Doing well on CPAP with excellent compliance and control  Plan Continue on CPAP at bedtime Work on healthy weight Do not drive if sleepy Follow-up in 4 to 6 months and as needed

## 2018-05-03 NOTE — Patient Instructions (Addendum)
Continue on CPAP at bedtime Keep up the good work Continue to work on [employer] Do not drive if sleepy  Continue on Arnuity 1 puff daily, rinse after use  Follow-up with Dr. Halford Chessman or [place] NP in 2-3  months and As needed

## 2018-05-03 NOTE — Assessment & Plan Note (Addendum)
Suspected cough variant asthma improved on Arnuity -continue current regimen and trigger prevention   Plan   Patient Instructions  Continue on CPAP at bedtime Keep up the good work Continue to work on [employer] Do not drive if sleepy  Continue on Arnuity 1 puff daily, rinse after use  Follow-up with Dr. Halford Chessman or Kenza Munar NP in 2-3  months and As needed

## 2018-05-04 NOTE — Progress Notes (Signed)
Reviewed and agree with assessment/plan.   Byard Carranza, MD [facility] 03/19/2016, 12:24 PM Pager:  [phone]  

## 2018-05-12 ENCOUNTER — Other Ambulatory Visit: Payer: Self-pay | Admitting: Internal Medicine

## 2018-05-21 ENCOUNTER — Other Ambulatory Visit: Payer: Self-pay | Admitting: Internal Medicine

## 2018-05-22 ENCOUNTER — Other Ambulatory Visit: Payer: Self-pay | Admitting: Internal Medicine

## 2018-06-04 ENCOUNTER — Other Ambulatory Visit: Payer: Self-pay | Admitting: Internal Medicine

## 2018-06-08 DIAGNOSIS — R6889 Other general symptoms and signs: Secondary | ICD-10-CM | POA: Diagnosis not present

## 2018-06-08 DIAGNOSIS — H6983 Other specified disorders of Eustachian tube, bilateral: Secondary | ICD-10-CM | POA: Diagnosis not present

## 2018-06-25 ENCOUNTER — Encounter: Payer: Self-pay | Admitting: Adult Health

## 2018-08-02 ENCOUNTER — Telehealth: Payer: Self-pay | Admitting: Adult Health

## 2018-08-02 ENCOUNTER — Other Ambulatory Visit: Payer: Self-pay

## 2018-08-02 ENCOUNTER — Encounter: Payer: Self-pay | Admitting: Acute Care

## 2018-08-02 ENCOUNTER — Ambulatory Visit (INDEPENDENT_AMBULATORY_CARE_PROVIDER_SITE_OTHER): Payer: Medicare Other | Admitting: Acute Care

## 2018-08-02 ENCOUNTER — Ambulatory Visit: Payer: Medicare Other | Admitting: Adult Health

## 2018-08-02 DIAGNOSIS — R05 Cough: Secondary | ICD-10-CM

## 2018-08-02 DIAGNOSIS — R059 Cough, unspecified: Secondary | ICD-10-CM

## 2018-08-02 DIAGNOSIS — Z20828 Contact with and (suspected) exposure to other viral communicable diseases: Secondary | ICD-10-CM | POA: Diagnosis not present

## 2018-08-02 DIAGNOSIS — G4733 Obstructive sleep apnea (adult) (pediatric): Secondary | ICD-10-CM

## 2018-08-02 NOTE — Progress Notes (Signed)
Virtual Visit via Video Note  I connected with Jonathon Armstrong on 08/02/18 at  9:00 AM EDT by a video enabled telemedicine application and verified that I am speaking with the correct person using two identifiers.  Location: Patient: The patient is in his home Provider: I am present in the Pulmonary Office at [place]   I discussed the limitations of evaluation and management by telemedicine and the availability of in person appointments. The patient expressed understanding and agreed to proceed.  I  confirmed date of birth and address to authenticate  Identity. My nurse Joella Prince reviewed medications and ordered any refills required.  Synopsis 68 year old male never smoker followed for obstructive sleep apnea and cough variant asthma by Dr. Halford Chessman.  Diagnosed with sleep apnea initially in the early 2000's at [facility]. He has been on CPAP since then. Medical history significant for hypertension, diabetes, hyperlipidemia   History of Present Illness: Pt. Presents for follow up of OSA and cough. He is 100% compliant with his CPAP machine. He wears his device for > 6 hours each night. He awakens well rested. He has a good night time regimen, and has no awakenings through the night.He awakens with energy, and he denies any am headaches.He has ample supplies. No issues with mask or equipment.  Pt. States his cough is well controlled, and stable at present. He is compliant with his Arnuity daily. He is rinsing his mouth after use.  He denies any issues at present, and is enjoying a very stable interval.     Observations/Objective: FeNO 04/16/18 >> 38 Spirometry 04/16/18 >> FEV1 1.9 (57%), FEV1% 35 HST 01/04/18 >> AHI 7.5, SpO2 low 87%.  Down Load: 06/30/2018-07/29/2018 AirSense 10 AutoSet 5-15 cm Pressure Usage 30/30 days > 4 hours 30 days Average usage 6 hours 28 days Median Pressure>> 7.2 cm H2O Max Pressure >> 13.1 cm H2O AHI is  0.9  Assessment and Plan: Obstructive sleep apnea Doing well on CPAP with excellent compliance and control Plan Continue on CPAP at bedtime. You appear to be benefiting from the treatment  Goal is to wear for at least 6 hours each night for maximal clinical benefit. Continue to work on weight loss, as the link between excess weight  and sleep apnea is well established.   Remember to establish a good bedtime routine, and work on sleep hygiene.  Limit daytime naps , avoid stimulants such as caffeine and nicotine close to bedtime, exercise daily to promote sleep quality, avoid heavy, spicy, fried , or rich foods before bed.  Ensure adequate exposure to natural light during the day,establish a relaxing bedtime routine with a pleasant sleep environment  (Bedroom between 60 and 67 degrees, turn off bright lights , TV or device screens screens , consider black out curtains or white noise machines) Do not drive if sleepy. Remember to clean mask, tubing, filter, and reservoir once weekly with soapy water.  Let dry completely prior to wearing We will schedule an overnight oximetry to ensure CPAP therapy has resolved drops in your oxygen levels while you sleep. Follow up with Dr. Halford Chessman or Tammy Parrett in 3 months or before as needed.  Continue to  wash hand frequently, and wear a face mask if you leave your home. Remember to utilize the early hours at grocery stores/ drug stores  for people > 56 years old or any underlying health risks, as the stores are cleaner and less crowded at these times.  Cough Suspected cough variant asthma improved on Arnuity - Plan Continue current regimen and trigger prevention  Continue on Arnuity 1 puff daily, rinse after use       Follow Up Instructions:  Follow up in 3 months after overnight oximetry   I discussed the assessment and treatment plan with the patient. The patient was provided an opportunity to ask questions and all were answered. The patient  agreed with the plan and demonstrated an understanding of the instructions.   The patient was advised to call back or seek an in-person evaluation if the symptoms worsen or if the condition fails to improve as anticipated.  I provided 23 minutes of non-face-to-face time during this encounter.   Magdalen Spatz, NP 08/02/2018 11:18 AM

## 2018-08-02 NOTE — Addendum Note (Signed)
Addended by: Joella Prince on: 08/02/2018 11:48 AM   Modules accepted: Orders

## 2018-08-02 NOTE — Assessment & Plan Note (Signed)
Great Compliance with CPAP therapy Plan Continue on CPAP at bedtime. You appear to be benefiting from the treatment  Goal is to wear for at least 6 hours each night for maximal clinical benefit. Continue to work on weight loss, as the link between excess weight  and sleep apnea is well established.   Remember to establish a good bedtime routine, and work on sleep hygiene.  Limit daytime naps , avoid stimulants such as caffeine and nicotine close to bedtime, exercise daily to promote sleep quality, avoid heavy, spicy, fried , or rich foods before bed.  Ensure adequate exposure to natural light during the day,establish a relaxing bedtime routine with a pleasant sleep environment  (Bedroom between 60 and 67 degrees, turn off bright lights , TV or device screens screens , consider black out curtains or white noise machines) Do not drive if sleepy. Remember to clean mask, tubing, filter, and reservoir once weekly with soapy water.  Let dry completely prior to wearing We will schedule an overnight oximetry to ensure CPAP therapy has resolved drops in your oxygen levels while you sleep. Follow up with Dr. Halford Chessman or Tammy Parrett in 3 months or before as needed.  Continue to  wash hand frequently, and wear a face mask if you leave your home. Remember to utilize the early hours at grocery stores/ drug stores  for people > 44 years old or any underlying health risks, as the stores are cleaner and less crowded at these times.

## 2018-08-02 NOTE — Assessment & Plan Note (Signed)
Stable interval Suspected cough variant asthma Compliant with Arnuity Plan Continue Arnuity 1 puff once daily Rinse mouth after use Follow up in 3 months Please contact office for sooner follow up if symptoms do not improve or worsen or seek emergency care

## 2018-08-02 NOTE — Patient Instructions (Addendum)
It is great to talk with you today. Continue on CPAP at bedtime. You appear to be benefiting from the treatment  Goal is to wear for at least 6 hours each night for maximal clinical benefit. Continue to work on weight loss, as the link between excess weight  and sleep apnea is well established.   Remember to establish a good bedtime routine, and work on sleep hygiene.  Limit daytime naps , avoid stimulants such as caffeine and nicotine close to bedtime, exercise daily to promote sleep quality, avoid heavy, spicy, fried , or rich foods before bed.  Ensure adequate exposure to natural light during the day,establish a relaxing bedtime routine with a pleasant sleep environment  (Bedroom between 60 and 67 degrees, turn off bright lights , TV or device screens screens , consider black out curtains or white noise machines) Do not drive if sleepy. Remember to clean mask, tubing, filter, and reservoir once weekly with soapy water.  Let dry completely prior to wearing We will schedule an overnight oximetry to ensure CPAP therapy has resolved drops in your oxygen levels while you sleep. Follow up with Dr. Halford Chessman or Tammy Parrett in 3 months or before as needed.  Continue to  wash hand frequently, and wear a face mask if you leave your home. Remember to utilize the early hours at grocery stores/ drug stores  for people > 31 years old or any underlying health risks, as the stores are cleaner and less crowded at these times.   Continue Arnuity 1 puff once daily . Rinse mouth after use Follow up in 3 months

## 2018-08-02 NOTE — Telephone Encounter (Signed)
Patient was scheduled for 0900 telephone visit with Parrett NP Have called patient x6 with no answer and VMB is full so unable to leave a message E-mail sent to patient via MyChart

## 2018-08-10 DIAGNOSIS — G4733 Obstructive sleep apnea (adult) (pediatric): Secondary | ICD-10-CM | POA: Diagnosis not present

## 2018-08-18 ENCOUNTER — Telehealth: Payer: Self-pay

## 2018-08-18 DIAGNOSIS — G4733 Obstructive sleep apnea (adult) (pediatric): Secondary | ICD-10-CM

## 2018-08-18 NOTE — Telephone Encounter (Signed)
I called pt but there was no answer and VM to leave message. His ONO came back and he needs 2L of O2 added to his CPAP at night. I will call pt back to see if I can call him back at another time.

## 2018-08-20 ENCOUNTER — Other Ambulatory Visit: Payer: Self-pay | Admitting: Internal Medicine

## 2018-08-25 NOTE — Telephone Encounter (Signed)
I attempted to call pt's (M) [phone] and (H) [phone], both lines rang busy, so I was not able to leave a message at this time X2. Routing to [place] as an Pharmacist, hospital.

## 2018-08-31 NOTE — Telephone Encounter (Signed)
ATC pt, no answer. Left message for pt to call back.  

## 2018-09-01 ENCOUNTER — Telehealth: Payer: Self-pay | Admitting: Acute Care

## 2018-09-01 NOTE — Telephone Encounter (Signed)
Called and spoke with [place] from [place]. Per [place] that pt had will not be accepted by pt's insurance for him to have the CPAP with the O2.  Bethanne Ginger stated that pt will need to have a CPAP titration performed or we could do a qualifying in office for 24 hr of O2. Per pt's last OV, it seems like pt only would need the O2 at night with the CPAP.  Bethanne Ginger also stated that pt's OV from 08/02/2018 has expired as the visits have to be within 30 days per Medicare.  Sarah, please advise on all this for pt and [employer]. Thank you!

## 2018-09-01 NOTE — Addendum Note (Signed)
Addended by: Jannette Spanner on: 09/01/2018 12:01 PM   Modules accepted: Orders

## 2018-09-01 NOTE — Telephone Encounter (Signed)
The office is already closed when I tried to call. Will try again in the morning.

## 2018-09-01 NOTE — Telephone Encounter (Signed)
I called pt but there was no answer and no VM to leave message. I sent an unable to reach letter to his address.  Order was placed.

## 2018-09-01 NOTE — Telephone Encounter (Signed)
Please resubmit O&O to insurance without OSA diagnosis  Please call [employer] back and ask them what is needed as this message is not at all clear. Thanks

## 2018-09-02 ENCOUNTER — Encounter: Payer: Self-pay | Admitting: Acute Care

## 2018-09-02 ENCOUNTER — Other Ambulatory Visit: Payer: Self-pay | Admitting: Internal Medicine

## 2018-09-02 NOTE — Telephone Encounter (Signed)
Schedule patient for appointment so that we can get an O&O to ensure CPAP has resolved his nocturnal hypoxemia. Thanks

## 2018-09-02 NOTE — Telephone Encounter (Signed)
Insurance will not pay for ONO with dx of OSA. Must use a pulmonary diagnosis. This was ordered on 08/02/18 therefore if you want another ONO patient will need another office visit.

## 2018-09-02 NOTE — Progress Notes (Unsigned)
Opened in error

## 2018-09-03 ENCOUNTER — Telehealth: Payer: Self-pay | Admitting: Acute Care

## 2018-09-03 NOTE — Telephone Encounter (Addendum)
I called pt but there was no answer. If pt calls back please get a nurse on the phone if possible. Thanks.

## 2018-09-03 NOTE — Telephone Encounter (Signed)
Attempted to call pt but line went straight to VM. Unable to leave a VM as mailbox is full. Will try to call back later.

## 2018-09-03 NOTE — Telephone Encounter (Signed)
Patient called back about results from letter he received that we were trying to contact him.

## 2018-09-03 NOTE — Telephone Encounter (Signed)
Called pt's wife cell number and home number but there was no answer and I left a voicemail to have her call back or have the patient call back. To discuss.

## 2018-09-06 NOTE — Telephone Encounter (Signed)
Left message for patient to call back  

## 2018-09-07 NOTE — Telephone Encounter (Signed)
Spoke with pt, he states he will schedule an appt to repeat ONO. I made him an appt on 09/15/2018 at 3:30 with Eric Form. Nothing further is needed.

## 2018-09-07 NOTE — Telephone Encounter (Signed)
Attempted to call pt but unable to reach and unable to leave a VM. Will try to call back later.

## 2018-09-15 ENCOUNTER — Other Ambulatory Visit: Payer: Self-pay

## 2018-09-15 ENCOUNTER — Ambulatory Visit (INDEPENDENT_AMBULATORY_CARE_PROVIDER_SITE_OTHER): Payer: Medicare Other | Admitting: Acute Care

## 2018-09-15 ENCOUNTER — Encounter: Payer: Self-pay | Admitting: Acute Care

## 2018-09-15 VITALS — BP 144/90 | HR 75 | Temp 98.1°F | Ht 69.0 in | Wt 221.2 lb

## 2018-09-15 DIAGNOSIS — Z9989 Dependence on other enabling machines and devices: Secondary | ICD-10-CM | POA: Diagnosis not present

## 2018-09-15 DIAGNOSIS — G4733 Obstructive sleep apnea (adult) (pediatric): Secondary | ICD-10-CM | POA: Diagnosis not present

## 2018-09-15 NOTE — Progress Notes (Addendum)
History of Present Illness Jonathon Armstrong is a 68 y.o. male never smoker followed for obstructive sleep apnea and cough variant asthma by Dr. Halford Chessman.Diagnosed with sleep apnea initially in the early 2000's at [facility]. He has been on CPAP since then. He is followed by Dr. Halford Chessman. Medical history significant for hypertension, diabetes, hyperlipidemia   09/15/2018  Pt. Presents for follow up. Pt. Was last seen 08/02/2018 for OSA. He has been complaint with his CPAP machine. We were going to repeat his O&O to ensure his nocturnal desaturations have improved with the CPAP therapy. The study was done. He continued to have desaturations and qualified for nocturnal oxygem.  I asked my nurse to order oxygen at 2 L [place]. My nurse was unable to make telephone contact with the patient to let him know she was ordering oxygen for his continued desaturations. His phone goes right to a message, which then states his mail box is full.  She did not order the oxygen because she was unable to contact the patient. Per the DME, the patient needs to repeat the sleep study as it has been > 30 days since the study was done.   Pt. States that he has been doing well . He is 100% compliant with his CPAP. He has no daytime sleepiness. He is awake and alert all day long. No morning  headaches. He denies any shortness of breath. He does not awaken with air hunger. He is compliant with his Arnuity daily. He rarely uses his rescue inhaler  Test Results: FeNO 04/16/18 >> 38 Spirometry 04/16/18 >> FEV1 1.9 (57%), FEV1% 35 HST 01/04/18 >> AHI 7.5, SpO2 low 87%.  Down Load: 08/16/2018-09/14/2018 AirSense 10 AutoSet 5-15 cm Pressure Usage 30/30 days > 4 hours 30 days Average usage 6 hours 51 minutes  Median Pressure>> 7.5 cm H2O Max Pressure >> 14 cm H2O AHI is 1.2   CBC Latest Ref Rng & Units 07/24/2017 04/30/2016 03/28/2015  WBC 4.0 - 10.5 K/uL 8.4 6.8 7.2  Hemoglobin 13.0 - 17.0 g/dL 14.2 13.5 14.4  Hematocrit 39.0 -  52.0 % 40.8 39.1 42.2  Platelets 150.0 - 400.0 K/uL 192.0 167.0 168.0    BMP Latest Ref Rng & Units 03/02/2018 07/24/2017 12/25/2016  Glucose 70 - 99 mg/dL 139(H) 156(H) 195(H)  BUN 6 - 23 mg/dL 15 21 15   Creatinine 0.40 - 1.50 mg/dL 1.17 1.27 1.13  Sodium 135 - 145 mEq/L 139 139 137  Potassium 3.5 - 5.1 mEq/L 4.4 4.6 4.1  Chloride 96 - 112 mEq/L 104 103 104  CO2 19 - 32 mEq/L 26 25 23   Calcium 8.4 - 10.5 mg/dL 10.2 10.1 9.8    BNP No results found for: BNP  ProBNP No results found for: PROBNP  PFT No results found for: FEV1PRE, FEV1POST, FVCPRE, FVCPOST, TLC, DLCOUNC, PREFEV1FVCRT, PSTFEV1FVCRT  No results found.   Past medical hx Past Medical History:  Diagnosis Date  . Allergy    seasonal  . Asystole ([place])    "vagal response"post nasal polypectomy  . Diabetes mellitus without complication ([place])    pre but take metformin  . Diverticulosis of colon   . FH: colonic polyps    hx of  . Hyperlipidemia   . Hypertension   . Mitral regurgitation    mild & aortic Valve calcification on 2d echo;sbe prophylaxis  . Parotitis, acute 2013  . Seasonal rhinitis   . Sleep apnea    CPAP     Social History  Tobacco Use  . Smoking status: Never Smoker  . Smokeless tobacco: Never Used  Substance Use Topics  . Alcohol use: Yes    Alcohol/week: 2.0 standard drinks    Types: 1 Glasses of wine, 1 Shots of liquor per week    Comment: socially < 2/ week  . Drug use: No    Mr.Vanacker reports that he has never smoked. He has never used smokeless tobacco. He reports current alcohol use of about 2.0 standard drinks of alcohol per week. He reports that he does not use drugs.  Tobacco Cessation: Never smoker  Past surgical hx, Family hx, Social hx all reviewed.  Current Outpatient Medications on File Prior to Visit  Medication Sig  . albuterol (VENTOLIN HFA) 108 (90 Base) MCG/ACT inhaler Inhale 2 puffs into the lungs every 6 (six) hours as needed for wheezing or shortness of  breath.  Marland Kitchen amoxicillin (AMOXIL) 500 MG capsule Take 500 mg by mouth as needed.  Marland Kitchen aspirin (BAYER LOW STRENGTH) 81 MG EC tablet Take 81 mg by mouth daily.    Marland Kitchen ezetimibe-simvastatin (VYTORIN) 10-20 MG tablet TAKE (1) TABLET DAILY AT BEDTIME.  Marland Kitchen Fluticasone Furoate (ARNUITY ELLIPTA) 100 MCG/ACT AEPB Inhale 1 puff into the lungs daily.  Marland Kitchen labetalol (NORMODYNE) 300 MG tablet TAKE (1/2) TABLET TWICE DAILY.  . metFORMIN (GLUCOPHAGE) 500 MG tablet TAKE 2 TABLETS TWICE DAILY WITH FOOD.  Marland Kitchen spironolactone (ALDACTONE) 25 MG tablet TAKE 1 TABLET ONCE DAILY.  Marland Kitchen telmisartan (MICARDIS) 80 MG tablet TAKE 1 TABLET ONCE DAILY.   No current facility-administered medications on file prior to visit.      Allergies  Allergen Reactions  . Diltiazem Hcl     ? symptoms  . Penicillins     Rash with Amoxicillin with Mono in college    Review Of Systems:  Constitutional:   No  weight loss, night sweats,  Fevers, chills, fatigue, or  lassitude.  HEENT:   No headaches,  Difficulty swallowing,  Tooth/dental problems, or  Sore throat,                No sneezing, itching, ear ache, nasal congestion, post nasal drip,   CV:  No chest pain,  Orthopnea, PND, swelling in lower extremities, anasarca, dizziness, palpitations, syncope.   GI  No heartburn, indigestion, abdominal pain, nausea, vomiting, diarrhea, change in bowel habits, loss of appetite, bloody stools.   Resp: No shortness of breath with exertion or at rest.  No excess mucus, no productive cough,  No non-productive cough,  No coughing up of blood.  No change in color of mucus.  No wheezing.  No chest wall deformity  Skin: no rash or lesions.  GU: no dysuria, change in color of urine, no urgency or frequency.  No flank pain, no hematuria   MS:  No joint pain or swelling.  No decreased range of motion.  No back pain.  Psych:  No change in mood or affect. No depression or anxiety.  No memory loss.   Vital Signs BP (!) 144/90 (BP Location: Left Arm,  Cuff Size: Normal)   Pulse 75   Temp 98.1 F (36.7 C) (Oral)   Ht 5\' 9"  (1.753 m)   Wt 221 lb 3.2 oz (100.3 kg)   SpO2 98%   BMI 32.67 kg/m    Physical Exam:  General- No distress,  A&Ox3, pleasant ENT: No sinus tenderness, TM clear, pale nasal mucosa, no oral exudate,no post nasal drip, no LAN Cardiac: S1, S2, regular rate  and rhythm, no murmur Chest: No wheeze/ rales/ dullness; no accessory muscle use, no nasal flaring, no sternal retractions Abd.: Soft Non-tender, ND, BS +,  Ext: No clubbing cyanosis, edema Neuro:  normal strength Skin: No rashes, warm and dry Psych: normal mood and behavior   Assessment/Plan Nocturnal hypoxemia  We will order another O&O We will call you with results  Continue Arnuity daily as you have been doing. Rinse mouth after use Use the rescue inhaler as needed for breakthrough shortness of breath.  Watch your Blood pressure Please follow up with PCP if it remains elevated.  Continue on CPAP at bedtime. You appear to be benefiting from the treatment  Goal is to wear for at least 6 hours each night for maximal clinical benefit. Continue to work on weight loss, as the link between excess weight  and sleep apnea is well established.   Remember to establish a good bedtime routine, and work on sleep hygiene.  Limit daytime naps , avoid stimulants such as caffeine and nicotine close to bedtime, exercise daily to promote sleep quality, avoid heavy , spicy, fried , or rich foods before bed. Ensure adequate exposure to natural light during the day,establish a relaxing bedtime routine with a pleasant sleep environment ( Bedroom between 60 and 67 degrees, turn off bright lights , TV or device screens screens , consider black out curtains or white noise machines) Do not drive if sleepy. Remember to clean mask, tubing, filter, and reservoir once weekly with soapy water.  Follow up with   In 6 months  or before as needed.    Addendum 09/20/2018 Pt. Had O&O  done , and does not require overnight oxygen based on Medicare Guidelines.  Magdalen Spatz, NP 09/15/2018  3:55 PM

## 2018-09-15 NOTE — Patient Instructions (Addendum)
It is good to see you today We will order another O&O We will e mail  you with results If needed we will order oxygen for night time use.  Continue Arnuity daily as you have been doing. Rinse mouth after use Use the rescue inhaler as needed for breakthrough shortness of breath.  Continue on CPAP at bedtime. You appear to be benefiting from the treatment  Goal is to wear for at least 6 hours each night for maximal clinical benefit. Continue to work on weight loss, as the link between excess weight  and sleep apnea is well established.   Remember to establish a good bedtime routine, and work on sleep hygiene.  Limit daytime naps , avoid stimulants such as caffeine and nicotine close to bedtime, exercise daily to promote sleep quality, avoid heavy , spicy, fried , or rich foods before bed. Ensure adequate exposure to natural light during the day,establish a relaxing bedtime routine with a pleasant sleep environment ( Bedroom between 60 and 67 degrees, turn off bright lights , TV or device screens screens , consider black out curtains or white noise machines) Do not drive if sleepy. Remember to clean mask, tubing, filter, and reservoir once weekly with soapy water.  Follow up with   In 6 months  or before as needed.

## 2018-10-04 ENCOUNTER — Telehealth: Payer: Self-pay | Admitting: *Deleted

## 2018-10-04 NOTE — Telephone Encounter (Signed)
Unable to leave a message, mail box full. 

## 2018-11-02 NOTE — Progress Notes (Signed)
Reviewed and agree with assessment/plan.   Mischa Brittingham, MD [facility] 03/19/2016, 12:24 PM Pager:  [phone]  

## 2018-11-08 ENCOUNTER — Other Ambulatory Visit: Payer: Self-pay | Admitting: Internal Medicine

## 2018-11-10 ENCOUNTER — Telehealth: Payer: Self-pay

## 2018-11-10 DIAGNOSIS — E790 Hyperuricemia without signs of inflammatory arthritis and tophaceous disease: Secondary | ICD-10-CM

## 2018-11-10 DIAGNOSIS — I10 Essential (primary) hypertension: Secondary | ICD-10-CM

## 2018-11-10 DIAGNOSIS — E782 Mixed hyperlipidemia: Secondary | ICD-10-CM

## 2018-11-10 DIAGNOSIS — Z125 Encounter for screening for malignant neoplasm of prostate: Secondary | ICD-10-CM

## 2018-11-10 DIAGNOSIS — E119 Type 2 diabetes mellitus without complications: Secondary | ICD-10-CM

## 2018-11-10 NOTE — Telephone Encounter (Signed)
Pt scheduled 8/21. Would like labs put in

## 2018-11-10 NOTE — Telephone Encounter (Signed)
Copied from [name] [id number]. Topic: General - Inquiry >> Nov 09, 2018  3:00 PM Jonathon Armstrong, [place] wrote: Reason for CRM: Patient called in stating he is wanting lab orders in for a medication refill. Please advise. Pt has appointment scheduled.

## 2018-11-10 NOTE — Telephone Encounter (Signed)
ordered

## 2018-11-10 NOTE — Telephone Encounter (Signed)
LVM letting pt know.  

## 2018-11-11 NOTE — Progress Notes (Signed)
Subjective:    Patient ID: Jonathon Armstrong, male    DOB: 1951/01/03, 68 y.o.   MRN: [id number]  HPI The patient is here for follow up.  He is exercising regularly.     Diabetes: He is taking his medication daily as prescribed. He has not been as compliant with a diabetic diet.  He checks his feet daily and denies foot lesions. He is up-to-date with an ophthalmology examination.   Hypertension: He is taking his medication daily. He is compliant with a low sodium diet.  He denies chest pain, palpitations, edema, shortness of breath and regular headaches.  He does monitor his blood pressure at home - 120's/85. Marland Kitchen   Hyperlipidemia: He is taking his medication daily. He is compliant with a low fat/cholesterol diet. He denies myalgias.     Medications and allergies reviewed with patient and updated if appropriate.  Patient Active Problem List   Diagnosis Date Noted  . Chronic Eustachian tube dysfunction, right 01/18/2018  . Bilateral hearing loss 01/18/2018  . Cough 06/15/2015  . Diabetes ([place]) 02/25/2013  . Hyperlipidemia 12/05/2010  . NONSPECIFIC ABNORMAL ELECTROCARDIOGRAM 08/13/2009  . Hyperuricemia 02/07/2008  . DIVERTICULOSIS, COLON 10/08/2007  . History of colonic polyps 10/08/2007  . Essential hypertension 03/26/2007  . ASYSTOLE 02/22/2007  . Obstructive sleep apnea 08/28/2006    Current Outpatient Medications on File Prior to Visit  Medication Sig Dispense Refill  . albuterol (VENTOLIN HFA) 108 (90 Base) MCG/ACT inhaler Inhale 2 puffs into the lungs every 6 (six) hours as needed for wheezing or shortness of breath. 1 Inhaler 5  . amoxicillin (AMOXIL) 500 MG capsule Take 500 mg by mouth as needed.    Marland Kitchen aspirin (BAYER LOW STRENGTH) 81 MG EC tablet Take 81 mg by mouth daily.      . Fluticasone Furoate (ARNUITY ELLIPTA) 100 MCG/ACT AEPB Inhale 1 puff into the lungs daily. 30 each 5   No current facility-administered medications on file prior to visit.     Past Medical  History:  Diagnosis Date  . Allergy    seasonal  . Asystole ([place])    "vagal response"post nasal polypectomy  . Diabetes mellitus without complication ([place])    pre but take metformin  . Diverticulosis of colon   . FH: colonic polyps    hx of  . Hyperlipidemia   . Hypertension   . Mitral regurgitation    mild & aortic Valve calcification on 2d echo;sbe prophylaxis  . Parotitis, acute 2013  . Seasonal rhinitis   . Sleep apnea    CPAP    Past Surgical History:  Procedure Laterality Date  . APPENDECTOMY    . asytole post nasal polypectomy  2008  . colonoscopy with polypectomy  2010   Dr Olevia Perches  . heriorraphy     x2  . HERNIA REPAIR    . MIDDLE EAR SURGERY     TM replacement  . NASAL SINUS SURGERY  1994, [id number]   X3  . TONSILLECTOMY AND ADENOIDECTOMY      Social History   Socioeconomic History  . Marital status: Married    Spouse name: Not on file  . Number of children: 2  . Years of education: 57  . Highest education level: Not on file  Occupational History  . Occupation: Retired    Fish farm manager: RETIRED  Social Needs  . Financial resource strain: Not hard at all  . Food insecurity    Worry: Never true    Inability: Never true  .  Transportation needs    Medical: No    Non-medical: No  Tobacco Use  . Smoking status: Never Smoker  . Smokeless tobacco: Never Used  Substance and Sexual Activity  . Alcohol use: Yes    Alcohol/week: 2.0 standard drinks    Types: 1 Glasses of wine, 1 Shots of liquor per week    Comment: socially < 2/ week  . Drug use: No  . Sexual activity: Yes  Lifestyle  . Physical activity    Days per week: 3 days    Minutes per session: 60 min  . Stress: Not at all  Relationships  . Social connections    Talks on phone: More than three times a week    Gets together: More than three times a week    Attends religious service: More than 4 times per year    Active member of club or organization: Yes    Attends meetings of clubs or  organizations: More than 4 times per year    Relationship status: Married  Other Topics Concern  . Not on file  Social History Narrative   Fun: Water, swimming, [employer]; Psychologist, occupational work    Denies abuse and feels safe at home.   Regular exercise       Family History  Problem Relation Age of Onset  . Cancer Mother        liver &multiple myeloma  . Heart disease Mother        CABG, Angina  . Hyperlipidemia Mother   . Hypertension Father   . Heart disease Father        MI in 38"s  . Dementia Father   . Cancer Maternal Grandfather        bone  . Other Sister        perforated bowel  . Other Sister        cns aneurysm  . Diabetes Neg Hx   . Stroke Neg Hx   . Colon cancer Neg Hx     Review of Systems  Constitutional: Negative for chills and fever.  Respiratory: Negative for cough, shortness of breath and wheezing.   Cardiovascular: Negative for chest pain, palpitations and leg swelling.  Musculoskeletal: Negative for myalgias.  Neurological: Negative for light-headedness and headaches.       Objective:   Vitals:   11/12/18 0943  BP: (!) 144/86  Pulse: 69  Resp: 16  Temp: 98.7 F (37.1 C)  SpO2: 97%   BP Readings from Last 3 Encounters:  11/12/18 (!) 144/86  09/15/18 (!) 144/90  05/03/18 122/76   Wt Readings from Last 3 Encounters:  11/12/18 228 lb (103.4 kg)  09/15/18 221 lb 3.2 oz (100.3 kg)  05/03/18 218 lb 9.6 oz (99.2 kg)   Body mass index is 33.67 kg/m.   Physical Exam    Constitutional: Appears well-developed and well-nourished. No distress.  HENT:  Head: Normocephalic and atraumatic.  Neck: Neck supple. No tracheal deviation present. No thyromegaly present.  No cervical lymphadenopathy Cardiovascular: Normal rate, regular rhythm and normal heart sounds.   No murmur heard. No carotid bruit .  No edema Pulmonary/Chest: Effort normal and breath sounds normal. No respiratory distress. No has no wheezes. No rales.  Skin: Skin is warm and dry.  Not diaphoretic.  Psychiatric: Normal mood and affect. Behavior is normal.      Assessment & Plan:    See Problem List for Assessment and Plan of chronic medical problems.

## 2018-11-11 NOTE — Patient Instructions (Addendum)
   Medications reviewed and updated.  Changes include :   none  Your prescription(s) have been submitted to your pharmacy. Please take as directed and contact our office if you believe you are having problem(s) with the medication(s).    Please followup in 6 months   

## 2018-11-12 ENCOUNTER — Other Ambulatory Visit: Payer: Self-pay

## 2018-11-12 ENCOUNTER — Encounter: Payer: Self-pay | Admitting: Internal Medicine

## 2018-11-12 ENCOUNTER — Other Ambulatory Visit (INDEPENDENT_AMBULATORY_CARE_PROVIDER_SITE_OTHER): Payer: Medicare Other

## 2018-11-12 ENCOUNTER — Ambulatory Visit (INDEPENDENT_AMBULATORY_CARE_PROVIDER_SITE_OTHER): Payer: Medicare Other | Admitting: Internal Medicine

## 2018-11-12 VITALS — BP 144/86 | HR 69 | Temp 98.7°F | Resp 16 | Ht 69.0 in | Wt 228.0 lb

## 2018-11-12 DIAGNOSIS — I10 Essential (primary) hypertension: Secondary | ICD-10-CM

## 2018-11-12 DIAGNOSIS — E782 Mixed hyperlipidemia: Secondary | ICD-10-CM | POA: Diagnosis not present

## 2018-11-12 DIAGNOSIS — Z125 Encounter for screening for malignant neoplasm of prostate: Secondary | ICD-10-CM

## 2018-11-12 DIAGNOSIS — E119 Type 2 diabetes mellitus without complications: Secondary | ICD-10-CM | POA: Diagnosis not present

## 2018-11-12 DIAGNOSIS — E790 Hyperuricemia without signs of inflammatory arthritis and tophaceous disease: Secondary | ICD-10-CM

## 2018-11-12 DIAGNOSIS — Z23 Encounter for immunization: Secondary | ICD-10-CM

## 2018-11-12 LAB — COMPREHENSIVE METABOLIC PANEL
ALT: 34 U/L (ref 0–53)
AST: 26 U/L (ref 0–37)
Albumin: 4.9 g/dL (ref 3.5–5.2)
Alkaline Phosphatase: 64 U/L (ref 39–117)
BUN: 17 mg/dL (ref 6–23)
CO2: 25 mEq/L (ref 19–32)
Calcium: 10 mg/dL (ref 8.4–10.5)
Chloride: 105 mEq/L (ref 96–112)
Creatinine, Ser: 1.19 mg/dL (ref 0.40–1.50)
GFR: 60.56 mL/min (ref >60.00)
Glucose, Bld: 145 mg/dL — ABNORMAL HIGH (ref 70–99)
Potassium: 4.3 mEq/L (ref 3.5–5.1)
Sodium: 140 mEq/L (ref 135–145)
Total Bilirubin: 0.5 mg/dL (ref 0.2–1.2)
Total Protein: 6.8 g/dL (ref 6.0–8.3)

## 2018-11-12 LAB — CBC WITH DIFFERENTIAL/PLATELET
Basophils Absolute: 0 10*3/uL (ref 0.0–0.1)
Basophils Relative: 0.5 % (ref 0.0–3.0)
Eosinophils Absolute: 0.1 10*3/uL (ref 0.0–0.7)
Eosinophils Relative: 1.6 % (ref 0.0–5.0)
HCT: 38.5 % — ABNORMAL LOW (ref 39.0–52.0)
Hemoglobin: 13.4 g/dL (ref 13.0–17.0)
Lymphocytes Relative: 26.6 % (ref 12.0–46.0)
Lymphs Abs: 1.9 10*3/uL (ref 0.7–4.0)
MCHC: 35 g/dL (ref 30.0–36.0)
MCV: 88.6 fl (ref 78.0–100.0)
Monocytes Absolute: 0.6 10*3/uL (ref 0.1–1.0)
Monocytes Relative: 8.4 % (ref 3.0–12.0)
Neutro Abs: 4.4 10*3/uL (ref 1.4–7.7)
Neutrophils Relative %: 62.9 % (ref 43.0–77.0)
Platelets: 156 10*3/uL (ref 150.0–400.0)
RBC: 4.34 Mil/uL (ref 4.22–5.81)
RDW: 13.8 % (ref 11.5–15.5)
WBC: 7 10*3/uL (ref 4.0–10.5)

## 2018-11-12 LAB — LIPID PANEL
Cholesterol: 118 mg/dL (ref 0–200)
HDL: 32 mg/dL — ABNORMAL LOW (ref >39.00)
NonHDL: 85.51
Total CHOL/HDL Ratio: 4
Triglycerides: 237 mg/dL — ABNORMAL HIGH (ref 0.0–149.0)
VLDL: 47.4 mg/dL — ABNORMAL HIGH (ref 0.0–40.0)

## 2018-11-12 LAB — TSH: TSH: 1.41 u[IU]/mL (ref 0.35–4.50)

## 2018-11-12 LAB — HEMOGLOBIN A1C: Hgb A1c MFr Bld: 7.2 % — ABNORMAL HIGH (ref 4.6–6.5)

## 2018-11-12 LAB — URIC ACID: Uric Acid, Serum: 8.9 mg/dL — ABNORMAL HIGH (ref 4.0–7.8)

## 2018-11-12 LAB — LDL CHOLESTEROL, DIRECT: Direct LDL: 55 mg/dL

## 2018-11-12 LAB — PSA, MEDICARE: PSA: 2.61 ng/ml (ref 0.10–4.00)

## 2018-11-12 MED ORDER — LABETALOL HCL 300 MG PO TABS: ORAL_TABLET | ORAL | 1 refills | Status: DC

## 2018-11-12 MED ORDER — SPIRONOLACTONE 25 MG PO TABS: 25.0000 mg | ORAL_TABLET | Freq: Every day | ORAL | 1 refills | Status: DC

## 2018-11-12 MED ORDER — EZETIMIBE-SIMVASTATIN 10-20 MG PO TABS: ORAL_TABLET | ORAL | 1 refills | Status: DC

## 2018-11-12 MED ORDER — METFORMIN HCL 500 MG PO TABS: ORAL_TABLET | ORAL | 1 refills | Status: DC

## 2018-11-12 MED ORDER — TELMISARTAN 80 MG PO TABS: 80.0000 mg | ORAL_TABLET | Freq: Every day | ORAL | 1 refills | Status: DC

## 2018-11-12 NOTE — Assessment & Plan Note (Signed)
Check a1c Restart Low sugar / carb diet Continue regular exercise Work on weight loss

## 2018-11-12 NOTE — Assessment & Plan Note (Signed)
Uric acid level

## 2018-11-12 NOTE — Progress Notes (Signed)
Reviewed and agree with assessment/plan.   Shyanne Mcclary, MD [facility] 03/19/2016, 12:24 PM Pager:  [phone]  

## 2018-11-12 NOTE — Assessment & Plan Note (Signed)
Bp good at home, slightly elevated here BP controlled Current regimen effective and well tolerated Continue current medications at current doses cmp

## 2018-11-12 NOTE — Assessment & Plan Note (Signed)
Check lipid panel  Continue daily statin Regular exercise and healthy diet encouraged  

## 2018-11-13 ENCOUNTER — Encounter: Payer: Self-pay | Admitting: Internal Medicine

## 2018-12-07 ENCOUNTER — Telehealth: Payer: Self-pay

## 2018-12-07 DIAGNOSIS — G4733 Obstructive sleep apnea (adult) (pediatric): Secondary | ICD-10-CM

## 2018-12-07 NOTE — Telephone Encounter (Signed)
After pt repeated ONO, he is now needing a cpap titration to qualify for O2 per DME company. I am going to send pt an email letting him know. We are unable to reach him since his phone will not let us through.

## 2018-12-21 NOTE — Telephone Encounter (Signed)
PCC's it may be better to send pt an email through mychart once CPAP titration is scheduled. Order was placed. We need this to qualify him for his oxygen at night. His insurance would not accept ONO. Thanks

## 2019-01-01 ENCOUNTER — Other Ambulatory Visit (HOSPITAL_COMMUNITY): Payer: Medicare Other

## 2019-01-04 ENCOUNTER — Ambulatory Visit (HOSPITAL_BASED_OUTPATIENT_CLINIC_OR_DEPARTMENT_OTHER): Payer: Medicare Other | Admitting: Pulmonary Disease

## 2019-01-26 ENCOUNTER — Other Ambulatory Visit: Payer: Self-pay | Admitting: Pulmonary Disease

## 2019-02-16 ENCOUNTER — Other Ambulatory Visit: Payer: Self-pay

## 2019-02-24 ENCOUNTER — Encounter (HOSPITAL_COMMUNITY): Payer: Medicare Other

## 2019-02-24 ENCOUNTER — Ambulatory Visit (INDEPENDENT_AMBULATORY_CARE_PROVIDER_SITE_OTHER): Payer: Medicare Other | Admitting: Internal Medicine

## 2019-02-24 ENCOUNTER — Other Ambulatory Visit: Payer: Self-pay

## 2019-02-24 ENCOUNTER — Encounter: Payer: Self-pay | Admitting: Internal Medicine

## 2019-02-24 VITALS — BP 132/84 | HR 79 | Temp 98.2°F | Ht 69.0 in | Wt 229.0 lb

## 2019-02-24 DIAGNOSIS — M7989 Other specified soft tissue disorders: Secondary | ICD-10-CM

## 2019-02-24 DIAGNOSIS — E790 Hyperuricemia without signs of inflammatory arthritis and tophaceous disease: Secondary | ICD-10-CM | POA: Diagnosis not present

## 2019-02-24 DIAGNOSIS — M79661 Pain in right lower leg: Secondary | ICD-10-CM | POA: Diagnosis not present

## 2019-02-24 DIAGNOSIS — E119 Type 2 diabetes mellitus without complications: Secondary | ICD-10-CM

## 2019-02-24 NOTE — Assessment & Plan Note (Signed)
By hx pain and swelling may be more associated with lateral right foot and maybe ankle? But curiously little to no pain today again but has definite edema whole RLE to near the knee - cant r/o DVT - for stat venous dopplers, ok to continue advil prn, and if RLE venous doppler neg, I advised to see Dr Tamala Julian sport medicine for u/s evaluation of the ankle.  Gout cannot be ruled out as well but again swelling is out of proportion to the pain today

## 2019-02-24 NOTE — Patient Instructions (Signed)
You will be contacted regarding the referral for: right leg venous doppler testing to make sure of any blood clot  You should continue the advil as this can help gout and tendonitis  If the pain and swelling persist, you should see Dr Tamala Julian Sports Medicine in this office for ultrasound to see if possible gout or tendonitis or other reason for pain and swelling  Please continue all other medications as before, and refills have been done if requested.  Please have the pharmacy call with any other refills you may need.  Please keep your appointments with your specialists as you may have planned

## 2019-02-24 NOTE — Progress Notes (Signed)
Subjective:    Patient ID: Jonathon Armstrong, male    DOB: [birth year], 68 y.o.   MRN: [id number]  HPI  Here after acute onset right foot pain he noticed with awakening, mostly the lateral aspect of the foot, mild to mod, constant, seemed better to wear certain tennis shoes, felt better the next day, but yesterday much worse again, tx with advil and ice pack, then today is better again.  Now swelling, no fever, no trauma, no turn of the foot or falling. No prior hx of same.  Pt denies chest pain, increased sob or doe, wheezing, orthopnea, PND, increased LE swelling, palpitations, dizziness or syncope.  Pt denies new neurological symptoms such as new headache, or facial or extremity weakness or numbness   Pt denies polydipsia, polyuria Past Medical History:  Diagnosis Date  . Allergy    seasonal  . Asystole ([place])    "vagal response"post nasal polypectomy  . Diabetes mellitus without complication ([place])    pre but take metformin  . Diverticulosis of colon   . FH: colonic polyps    hx of  . Hyperlipidemia   . Hypertension   . Mitral regurgitation    mild & aortic Valve calcification on 2d echo;sbe prophylaxis  . Parotitis, acute 2013  . Seasonal rhinitis   . Sleep apnea    CPAP   Past Surgical History:  Procedure Laterality Date  . APPENDECTOMY    . asytole post nasal polypectomy  2008  . colonoscopy with polypectomy  2010   Dr Olevia Perches  . heriorraphy     x2  . HERNIA REPAIR    . MIDDLE EAR SURGERY     TM replacement  . NASAL SINUS SURGERY  1994, [id number]   X3  . TONSILLECTOMY AND ADENOIDECTOMY      reports that he has never smoked. He has never used smokeless tobacco. He reports current alcohol use of about 2.0 standard drinks of alcohol per week. He reports that he does not use drugs. family history includes Cancer in his maternal grandfather and mother; Dementia in his father; Heart disease in his father and mother; Hyperlipidemia in his mother; Hypertension in his father; Other  in his sister and sister. Allergies  Allergen Reactions  . Diltiazem Hcl     ? symptoms  . Penicillins     Rash with Amoxicillin with Mono in college   Current Outpatient Medications on File Prior to Visit  Medication Sig Dispense Refill  . albuterol (VENTOLIN HFA) 108 (90 Base) MCG/ACT inhaler Inhale 2 puffs into the lungs every 6 (six) hours as needed for wheezing or shortness of breath. 1 Inhaler 5  . amoxicillin (AMOXIL) 500 MG capsule Take 500 mg by mouth as needed.    . ARNUITY ELLIPTA 100 MCG/ACT AEPB INHALE 1 PUFF ONCE DAILY. 30 each 5  . aspirin (BAYER LOW STRENGTH) 81 MG EC tablet Take 81 mg by mouth daily.      Marland Kitchen ezetimibe-simvastatin (VYTORIN) 10-20 MG tablet TAKE (1) TABLET DAILY AT BEDTIME. 90 tablet 1  . labetalol (NORMODYNE) 300 MG tablet Tale 1/2 tablet 2 times daily. 90 tablet 1  . metFORMIN (GLUCOPHAGE) 500 MG tablet TAKE 2 TABLETS TWICE DAILY WITH FOOD. 360 tablet 1  . spironolactone (ALDACTONE) 25 MG tablet Take 1 tablet (25 mg total) by mouth daily. 90 tablet 1  . telmisartan (MICARDIS) 80 MG tablet Take 1 tablet (80 mg total) by mouth daily. 90 tablet 1   No current facility-administered medications on  file prior to visit.    Review of Systems  Constitutional: Negative for other unusual diaphoresis or sweats HENT: Negative for ear discharge or swelling Eyes: Negative for other worsening visual disturbances Respiratory: Negative for stridor or other swelling  Gastrointestinal: Negative for worsening distension or other blood Genitourinary: Negative for retention or other urinary change Musculoskeletal: Negative for other MSK pain or swelling Skin: Negative for color change or other new lesions Neurological: Negative for worsening tremors and other numbness  Psychiatric/Behavioral: Negative for worsening agitation or other fatigue All otherwise neg per pt    Objective:   Physical Exam BP 132/84   Pulse 79   Temp 98.2 F (36.8 C) (Oral)   Ht 5\' 9"  (1.753  m)   Wt 229 lb (103.9 kg)   SpO2 96%   BMI 33.82 kg/m  VS noted,  Constitutional: Pt appears in NAD HENT: Head: NCAT.  Right Ear: External ear normal.  Left Ear: External ear normal.  Eyes: . Pupils are equal, round, and reactive to light. Conjunctivae and EOM are normal Nose: without d/c or deformity Neck: Neck supple. Gross normal ROM Cardiovascular: Normal rate and regular rhythm.   Pulmonary/Chest: Effort normal and breath sounds without rales or wheezing.  RLE with trace to 1+ diffuse swelling and tender to below the knee Neurological: Pt is alert. At baseline orientation, motor grossly intact Skin: Skin is warm. No rashes, other new lesions, no LE edema Psychiatric: Pt behavior is normal without agitation  All otherwise neg per pt  Lab Results  Component Value Date   WBC 7.0 11/12/2018   HGB 13.4 11/12/2018   HCT 38.5 (L) 11/12/2018   PLT 156.0 11/12/2018   GLUCOSE 145 (H) 11/12/2018   CHOL 118 11/12/2018   TRIG 237.0 (H) 11/12/2018   HDL 32.00 (L) 11/12/2018   LDLDIRECT 55.0 11/12/2018   LDLCALC 54 09/18/2014   ALT 34 11/12/2018   AST 26 11/12/2018   NA 140 11/12/2018   K 4.3 11/12/2018   CL 105 11/12/2018   CREATININE 1.19 11/12/2018   BUN 17 11/12/2018   CO2 25 11/12/2018   TSH 1.41 11/12/2018   PSA 2.61 11/12/2018   INR 1.0 02/05/2007   HGBA1C 7.2 (H) 11/12/2018   MICROALBUR 1.3 04/30/2016      Assessment & Plan:

## 2019-02-25 ENCOUNTER — Encounter (HOSPITAL_COMMUNITY): Payer: Self-pay

## 2019-02-25 ENCOUNTER — Encounter: Payer: Self-pay | Admitting: Internal Medicine

## 2019-02-25 ENCOUNTER — Ambulatory Visit (HOSPITAL_COMMUNITY)
Admission: RE | Admit: 2019-02-25 | Discharge: 2019-02-25 | Disposition: A | Discharge status: Home / Self Care | Payer: Medicare Other | Source: Ambulatory Visit | Attending: Cardiovascular Disease | Admitting: Cardiovascular Disease

## 2019-02-25 DIAGNOSIS — M7989 Other specified soft tissue disorders: Secondary | ICD-10-CM | POA: Diagnosis not present

## 2019-02-25 DIAGNOSIS — M79671 Pain in right foot: Secondary | ICD-10-CM

## 2019-02-25 DIAGNOSIS — M79661 Pain in right lower leg: Secondary | ICD-10-CM | POA: Diagnosis not present

## 2019-02-25 NOTE — Progress Notes (Unsigned)
Right lower venous has been completed and is negative for DVT. Preliminary results can be found under CV proc through chart review.  Bentlie Withem RVT [facility]  

## 2019-02-26 ENCOUNTER — Encounter: Payer: Self-pay | Admitting: Internal Medicine

## 2019-02-26 NOTE — Assessment & Plan Note (Signed)
For low purine diet

## 2019-02-26 NOTE — Assessment & Plan Note (Signed)
stable overall by history and exam, recent data reviewed with pt, and pt to continue medical treatment as before,  to f/u any worsening symptoms or concerns  

## 2019-02-28 ENCOUNTER — Other Ambulatory Visit: Payer: Self-pay

## 2019-02-28 ENCOUNTER — Encounter: Payer: Self-pay | Admitting: Internal Medicine

## 2019-02-28 ENCOUNTER — Ambulatory Visit (INDEPENDENT_AMBULATORY_CARE_PROVIDER_SITE_OTHER)
Admission: RE | Admit: 2019-02-28 | Discharge: 2019-02-28 | Disposition: A | Discharge status: Home / Self Care | Payer: Medicare Other | Source: Ambulatory Visit | Attending: Internal Medicine | Admitting: Internal Medicine

## 2019-02-28 DIAGNOSIS — M79671 Pain in right foot: Secondary | ICD-10-CM | POA: Diagnosis not present

## 2019-03-10 NOTE — Progress Notes (Signed)
HPI: FU hyperlipidemia. I saw him previously for bradycardia in 2008. Holter monitor showed sinus rhythm with brief PAT and PVC. Nuclear study showed ejection fraction 62% and no ischemia or infarction. Last echocardiogram 2010 showed normal LV function and mild mitral regurgitation.  Since last seen the patient has dyspnea with more extreme activities but not with routine activities. It is relieved with rest. It is not associated with chest pain. There is no orthopnea, PND or pedal edema. There is no syncope or palpitations. There is no exertional chest pain.   Current Outpatient Medications  Medication Sig Dispense Refill  . albuterol (VENTOLIN HFA) 108 (90 Base) MCG/ACT inhaler Inhale 2 puffs into the lungs every 6 (six) hours as needed for wheezing or shortness of breath. 1 Inhaler 5  . amoxicillin (AMOXIL) 500 MG capsule Take 500 mg by mouth as needed.    . ARNUITY ELLIPTA 100 MCG/ACT AEPB INHALE 1 PUFF ONCE DAILY. 30 each 5  . aspirin (BAYER LOW STRENGTH) 81 MG EC tablet Take 81 mg by mouth daily.      Marland Kitchen ezetimibe-simvastatin (VYTORIN) 10-20 MG tablet TAKE (1) TABLET DAILY AT BEDTIME. 90 tablet 1  . labetalol (NORMODYNE) 300 MG tablet Tale 1/2 tablet 2 times daily. 90 tablet 1  . metFORMIN (GLUCOPHAGE) 500 MG tablet TAKE 2 TABLETS TWICE DAILY WITH FOOD. 360 tablet 1  . spironolactone (ALDACTONE) 25 MG tablet Take 1 tablet (25 mg total) by mouth daily. 90 tablet 1  . telmisartan (MICARDIS) 80 MG tablet Take 1 tablet (80 mg total) by mouth daily. 90 tablet 1   No current facility-administered medications for this visit.     Past Medical History:  Diagnosis Date  . Allergy    seasonal  . Asystole ([place])    "vagal response"post nasal polypectomy  . Diabetes mellitus without complication ([place])    pre but take metformin  . Diverticulosis of colon   . FH: colonic polyps    hx of  . Hyperlipidemia   . Hypertension   . Mitral regurgitation    mild & aortic Valve calcification  on 2d echo;sbe prophylaxis  . Parotitis, acute 2013  . Seasonal rhinitis   . Sleep apnea    CPAP    Past Surgical History:  Procedure Laterality Date  . APPENDECTOMY    . asytole post nasal polypectomy  2008  . colonoscopy with polypectomy  2010   Dr Olevia Perches  . heriorraphy     x2  . HERNIA REPAIR    . MIDDLE EAR SURGERY     TM replacement  . NASAL SINUS SURGERY  1994, [id number]   X3  . TONSILLECTOMY AND ADENOIDECTOMY      Social History   Socioeconomic History  . Marital status: Married    Spouse name: Not on file  . Number of children: 2  . Years of education: 34  . Highest education level: Not on file  Occupational History  . Occupation: Retired    Fish farm manager: RETIRED  Tobacco Use  . Smoking status: Never Smoker  . Smokeless tobacco: Never Used  Substance and Sexual Activity  . Alcohol use: Yes    Alcohol/week: 2.0 standard drinks    Types: 1 Glasses of wine, 1 Shots of liquor per week    Comment: socially < 2/ week  . Drug use: No  . Sexual activity: Yes  Other Topics Concern  . Not on file  Social History Narrative   Fun: Water, swimming, [employer];  Volunteer work    Denies abuse and feels safe at home.   Regular exercise      Social Determinants of Health   Financial Resource Strain:   . Difficulty of Paying Living Expenses: Not on file  Food Insecurity:   . Worried About Charity fundraiser in the Last Year: Not on file  . Ran Out of Food in the Last Year: Not on file  Transportation Needs:   . Lack of Transportation (Medical): Not on file  . Lack of Transportation (Non-Medical): Not on file  Physical Activity:   . Days of Exercise per Week: Not on file  . Minutes of Exercise per Session: Not on file  Stress:   . Feeling of Stress : Not on file  Social Connections:   . Frequency of Communication with Friends and Family: Not on file  . Frequency of Social Gatherings with Friends and Family: Not on file  . Attends Religious Services: Not on file   . Active Member of Clubs or Organizations: Not on file  . Attends Archivist Meetings: Not on file  . Marital Status: Not on file  Intimate Partner Violence:   . Fear of Current or Ex-Partner: Not on file  . Emotionally Abused: Not on file  . Physically Abused: Not on file  . Sexually Abused: Not on file    Family History  Problem Relation Age of Onset  . Cancer Mother        liver &multiple myeloma  . Heart disease Mother        CABG, Angina  . Hyperlipidemia Mother   . Hypertension Father   . Heart disease Father        MI in 83"s  . Dementia Father   . Cancer Maternal Grandfather        bone  . Other Sister        perforated bowel  . Other Sister        cns aneurysm  . Diabetes Neg Hx   . Stroke Neg Hx   . Colon cancer Neg Hx     ROS: no fevers or chills, productive cough, hemoptysis, dysphasia, odynophagia, melena, hematochezia, dysuria, hematuria, rash, seizure activity, orthopnea, PND, pedal edema, claudication. Remaining systems are negative.  Physical Exam: Well-developed well-nourished in no acute distress.  Skin is warm and dry.  HEENT is normal.  Neck is supple.  Chest is clear to auscultation with normal expansion.  Cardiovascular exam is regular rate and rhythm.  Abdominal exam nontender or distended. No masses palpated. Extremities show no edema. neuro grossly intact  ECG-sinus rhythm at a rate of 65, no ST changes.  Personally reviewed  A/P  1 hypertension-patient's blood pressure is elevated; however he follows this and it is typically controlled.  Continue present medications and follow.  Given multiple risk factors we will arrange a calcium score for risk stratification.  2 hyperlipidemia-continue statin.  3 history of bradycardia-as outlined previously this occurred in the postoperative setting and there are no documented recurrences.  4 diabetes mellitus-managed by primary care.  Kirk Ruths, MD

## 2019-03-16 ENCOUNTER — Encounter: Payer: Self-pay | Admitting: Cardiology

## 2019-03-16 ENCOUNTER — Other Ambulatory Visit: Payer: Self-pay

## 2019-03-16 ENCOUNTER — Ambulatory Visit (INDEPENDENT_AMBULATORY_CARE_PROVIDER_SITE_OTHER): Payer: Medicare Other | Admitting: Cardiology

## 2019-03-16 VITALS — BP 149/88 | HR 65 | Temp 98.1°F | Ht 69.0 in | Wt 225.5 lb

## 2019-03-16 DIAGNOSIS — E7800 Pure hypercholesterolemia, unspecified: Secondary | ICD-10-CM

## 2019-03-16 DIAGNOSIS — R001 Bradycardia, unspecified: Secondary | ICD-10-CM

## 2019-03-16 DIAGNOSIS — I10 Essential (primary) hypertension: Secondary | ICD-10-CM

## 2019-03-16 NOTE — Patient Instructions (Signed)
Medication Instructions:  NO CHANGE *If you need a refill on your cardiac medications before your next appointment, please call your pharmacy*  Lab Work: If you have labs (blood work) drawn today and your tests are completely normal, you will receive your results only by: Marland Kitchen MyChart Message (if you have MyChart) OR . A paper copy in the mail If you have any lab test that is abnormal or we need to change your treatment, we will call you to review the results.  Testing/Procedures: CORONARY CALCIUM SCORE AT [place]  Follow-Up: At [facility], you and your health needs are our priority.  As part of our continuing mission to provide you with exceptional heart care, we have created designated Provider Care Teams.  These Care Teams include your primary Cardiologist (physician) and Advanced Practice Providers (APPs -  Physician Assistants and Nurse Practitioners) who all work together to provide you with the care you need, when you need it.  Your next appointment:   12 month(s)  The format for your next appointment:   Either In Person or Virtual  Provider:   You may see Kirk Ruths MD or one of the following Advanced Practice Providers on your designated Care Team:    Kerin Ransom, PA-C  [place], [place]  Coletta Memos, [place]

## 2019-03-31 ENCOUNTER — Ambulatory Visit (INDEPENDENT_AMBULATORY_CARE_PROVIDER_SITE_OTHER)
Admission: RE | Admit: 2019-03-31 | Discharge: 2019-03-31 | Disposition: A | Discharge status: Home / Self Care | Payer: Self-pay | Source: Ambulatory Visit | Attending: Cardiology | Admitting: Cardiology

## 2019-03-31 ENCOUNTER — Other Ambulatory Visit: Payer: Self-pay

## 2019-03-31 DIAGNOSIS — E7800 Pure hypercholesterolemia, unspecified: Secondary | ICD-10-CM

## 2019-03-31 DIAGNOSIS — I10 Essential (primary) hypertension: Secondary | ICD-10-CM

## 2019-04-04 ENCOUNTER — Telehealth: Payer: Self-pay | Admitting: *Deleted

## 2019-04-04 DIAGNOSIS — R931 Abnormal findings on diagnostic imaging of heart and coronary circulation: Secondary | ICD-10-CM

## 2019-04-04 NOTE — Telephone Encounter (Signed)
Spoke with pt, aware of the results. lexiscan order placed. Follow up scheduled

## 2019-04-04 NOTE — Telephone Encounter (Signed)
Left message for pt to call.

## 2019-04-04 NOTE — Telephone Encounter (Signed)
-----   Message from Lelon Perla, MD sent at 03/31/2019  5:42 PM EST ----- Schedule stress nuclear study due to high Ca score; repeat noncontrast chest CT 3 months due to lung nodule. Make sure he has fuov Kirk Ruths

## 2019-04-14 ENCOUNTER — Telehealth (HOSPITAL_COMMUNITY): Payer: Self-pay

## 2019-04-14 NOTE — Telephone Encounter (Signed)
Attempted to contact the patient, the line was busy. I will try again later. S.Legion Discher EMTP

## 2019-04-18 ENCOUNTER — Ambulatory Visit: Payer: Medicare Other | Attending: Internal Medicine

## 2019-04-18 DIAGNOSIS — Z23 Encounter for immunization: Secondary | ICD-10-CM | POA: Insufficient documentation

## 2019-04-18 NOTE — Progress Notes (Signed)
   Covid-19 Vaccination Clinic  Name:  Jonathon Armstrong    MRN: [id number] DOB: [birth year]  04/18/2019  Jonathon Armstrong was observed post Covid-19 immunization for 15 minutes without incidence. He was provided with Vaccine Information Sheet and instruction to access the V-Safe system.   Jonathon Armstrong was instructed to call 911 with any severe reactions post vaccine: Marland Kitchen Difficulty breathing  . Swelling of your face and throat  . A fast heartbeat  . A bad rash all over your body  . Dizziness and weakness    Immunizations Administered    Name Date Dose VIS Date Route   Pfizer COVID-19 Vaccine 04/18/2019  5:20 PM 0.3 mL 03/04/2019 Intramuscular   Manufacturer: Protection   Lot: [id number]   [place]: [id number]

## 2019-04-19 ENCOUNTER — Other Ambulatory Visit: Payer: Self-pay

## 2019-04-19 ENCOUNTER — Ambulatory Visit (HOSPITAL_COMMUNITY): Payer: Medicare Other | Attending: Cardiovascular Disease

## 2019-04-19 DIAGNOSIS — R931 Abnormal findings on diagnostic imaging of heart and coronary circulation: Secondary | ICD-10-CM | POA: Insufficient documentation

## 2019-04-19 DIAGNOSIS — I10 Essential (primary) hypertension: Secondary | ICD-10-CM | POA: Diagnosis not present

## 2019-04-19 DIAGNOSIS — R0602 Shortness of breath: Secondary | ICD-10-CM | POA: Diagnosis not present

## 2019-04-19 MED ORDER — TECHNETIUM TC 99M TETROFOSMIN IV KIT
32.5000 | PACK | Freq: Once | INTRAVENOUS | Status: AC | PRN
  Administered 2019-04-19: 32.5 via INTRAVENOUS
  Filled 2019-04-19: qty 33

## 2019-04-21 ENCOUNTER — Ambulatory Visit (HOSPITAL_COMMUNITY): Payer: Medicare Other | Attending: Cardiovascular Disease

## 2019-04-21 ENCOUNTER — Other Ambulatory Visit: Payer: Self-pay

## 2019-04-21 DIAGNOSIS — R931 Abnormal findings on diagnostic imaging of heart and coronary circulation: Secondary | ICD-10-CM | POA: Diagnosis not present

## 2019-04-21 LAB — MYOCARDIAL PERFUSION IMAGING
LV dias vol: 95 mL (ref 62–150)
LV sys vol: 40 mL
Peak HR: 108 {beats}/min
Rest HR: 74 {beats}/min
SDS: 0
SRS: 0
SSS: 0
TID: 1.16

## 2019-04-21 MED ORDER — TECHNETIUM TC 99M TETROFOSMIN IV KIT
33.0000 | PACK | Freq: Once | INTRAVENOUS | Status: AC | PRN
  Administered 2019-04-21: 33 via INTRAVENOUS
  Filled 2019-04-21: qty 33

## 2019-04-21 MED ORDER — REGADENOSON 0.4 MG/5ML IV SOLN
0.4000 mg | Freq: Once | INTRAVENOUS | Status: AC
  Administered 2019-04-21: 0.4 mg via INTRAVENOUS

## 2019-04-21 NOTE — Progress Notes (Signed)
HPI: FUhyperlipidemia and CAD. I saw him previously for bradycardia in 2008. Holter monitor showed sinus rhythm with brief PAT and PVC. Last echocardiogram 2010 showed normal LV function and mild mitral regurgitation.Calcium score January 2021 1368.  There was also note of a 13 mm posterior left lower lobe nodule and 40-monthrepeat chest CT, PET/CT or tissue sampling recommended. Nuclear study January 2021 showed ejection fraction 58% with normal perfusion. Since last seen  there is no dyspnea, chest pain, palpitations or syncope.  Current Outpatient Medications  Medication Sig Dispense Refill  . albuterol (VENTOLIN HFA) 108 (90 Base) MCG/ACT inhaler Inhale 2 puffs into the lungs every 6 (six) hours as needed for wheezing or shortness of breath. 1 Inhaler 5  . amoxicillin (AMOXIL) 500 MG capsule Take 500 mg by mouth as needed.    . ARNUITY ELLIPTA 100 MCG/ACT AEPB INHALE 1 PUFF ONCE DAILY. 30 each 5  . aspirin (BAYER LOW STRENGTH) 81 MG EC tablet Take 81 mg by mouth daily.      .Marland Kitchenezetimibe-simvastatin (VYTORIN) 10-20 MG tablet TAKE (1) TABLET DAILY AT BEDTIME. 90 tablet 1  . labetalol (NORMODYNE) 200 MG tablet Take 1 tablet (200 mg total) by mouth 2 (two) times daily. T 180 tablet 3  . metFORMIN (GLUCOPHAGE) 500 MG tablet TAKE 2 TABLETS TWICE DAILY WITH FOOD. 360 tablet 1  . spironolactone (ALDACTONE) 25 MG tablet Take 1 tablet (25 mg total) by mouth daily. 90 tablet 1  . telmisartan (MICARDIS) 80 MG tablet Take 1 tablet (80 mg total) by mouth daily. 90 tablet 3   No current facility-administered medications for this visit.     Past Medical History:  Diagnosis Date  . Allergy    seasonal  . [place]    "vagal response"post nasal polypectomy  . Diabetes mellitus without complication ([place]    pre but take metformin  . Diverticulosis of colon   . FH: colonic polyps    hx of  . Hyperlipidemia   . Hypertension   . Mitral regurgitation    mild & aortic Valve calcification  on 2d echo;sbe prophylaxis  . Parotitis, acute 2013  . Seasonal rhinitis   . Sleep apnea    CPAP    Past Surgical History:  Procedure Laterality Date  . APPENDECTOMY    . asytole post nasal polypectomy  2008  . colonoscopy with polypectomy  2010   Dr BOlevia Perches . heriorraphy     x2  . HERNIA REPAIR    . MIDDLE EAR SURGERY     TM replacement  . NASAL SINUS SURGERY  1994, 14103,0131  X3  . TONSILLECTOMY AND ADENOIDECTOMY      Social History   Socioeconomic History  . Marital status: Married    Spouse name: Not on file  . Number of children: 2  . Years of education: 170 . Highest education level: Not on file  Occupational History  . Occupation: Retired    EFish farm manager RETIRED  Tobacco Use  . Smoking status: Never Smoker  . Smokeless tobacco: Never Used  Substance and Sexual Activity  . Alcohol use: Yes    Alcohol/week: 2.0 standard drinks    Types: 1 Glasses of wine, 1 Shots of liquor per week    Comment: socially < 2/ week  . Drug use: No  . Sexual activity: Yes  Other Topics Concern  . Not on file  Social History Narrative   Fun: Water, swimming, L[employer]  work    Denies abuse and feels safe at home.   Regular exercise      Social Determinants of Health   Financial Resource Strain:   . Difficulty of Paying Living Expenses: Not on file  Food Insecurity:   . Worried About Charity fundraiser in the Last Year: Not on file  . Ran Out of Food in the Last Year: Not on file  Transportation Needs:   . Lack of Transportation (Medical): Not on file  . Lack of Transportation (Non-Medical): Not on file  Physical Activity:   . Days of Exercise per Week: Not on file  . Minutes of Exercise per Session: Not on file  Stress:   . Feeling of Stress : Not on file  Social Connections:   . Frequency of Communication with Friends and Family: Not on file  . Frequency of Social Gatherings with Friends and Family: Not on file  . Attends Religious Services: Not on file   . Active Member of Clubs or Organizations: Not on file  . Attends Archivist Meetings: Not on file  . Marital Status: Not on file  Intimate Partner Violence:   . Fear of Current or Ex-Partner: Not on file  . Emotionally Abused: Not on file  . Physically Abused: Not on file  . Sexually Abused: Not on file    Family History  Problem Relation Age of Onset  . Cancer Mother        liver &multiple myeloma  . Heart disease Mother        CABG, Angina  . Hyperlipidemia Mother   . Hypertension Father   . Heart disease Father        MI in 72"s  . Dementia Father   . Cancer Maternal Grandfather        bone  . Other Sister        perforated bowel  . Other Sister        cns aneurysm  . Diabetes Neg Hx   . Stroke Neg Hx   . Colon cancer Neg Hx     ROS: no fevers or chills, productive cough, hemoptysis, dysphasia, odynophagia, melena, hematochezia, dysuria, hematuria, rash, seizure activity, orthopnea, PND, pedal edema, claudication. Remaining systems are negative.  Physical Exam: Well-developed well-nourished in no acute distress.  Skin is warm and dry.  HEENT is normal.  Neck is supple.  Chest is clear to auscultation with normal expansion.  Cardiovascular exam is regular rate and rhythm.  Abdominal exam nontender or distended. No masses palpated. Extremities show no edema. neuro grossly intact  A/P  1 coronary artery disease-significantly elevated calcium score.  However nuclear study shows no ischemia.  Plan medical therapy.  Continue aspirin 81 mg daily.  Continue statin.  2 pulmonary nodule-patient will need follow-up noncontrast chest CT April 2021.  3 hyperlipidemia-last LDL 55.  Continue Vytorin.  4 history of bradycardia-this occurred in the postoperative setting with no recurrences.  5 hypertension-blood pressure elevated.  Increase labetalol to 200 mg twice daily and follow.  6 diabetes mellitus-Per primary care.  Kirk Ruths, MD

## 2019-05-02 ENCOUNTER — Encounter: Payer: Self-pay | Admitting: Cardiology

## 2019-05-02 ENCOUNTER — Ambulatory Visit (INDEPENDENT_AMBULATORY_CARE_PROVIDER_SITE_OTHER): Payer: Medicare Other | Admitting: Cardiology

## 2019-05-02 ENCOUNTER — Other Ambulatory Visit: Payer: Self-pay

## 2019-05-02 VITALS — BP 142/80 | HR 96 | Ht 70.0 in | Wt 223.0 lb

## 2019-05-02 DIAGNOSIS — E7800 Pure hypercholesterolemia, unspecified: Secondary | ICD-10-CM | POA: Diagnosis not present

## 2019-05-02 DIAGNOSIS — I10 Essential (primary) hypertension: Secondary | ICD-10-CM | POA: Diagnosis not present

## 2019-05-02 DIAGNOSIS — R931 Abnormal findings on diagnostic imaging of heart and coronary circulation: Secondary | ICD-10-CM | POA: Diagnosis not present

## 2019-05-02 MED ORDER — TELMISARTAN 80 MG PO TABS: 80.0000 mg | ORAL_TABLET | Freq: Every day | ORAL | 3 refills | Status: DC

## 2019-05-02 MED ORDER — LABETALOL HCL 200 MG PO TABS: 200.0000 mg | ORAL_TABLET | Freq: Two times a day (BID) | ORAL | 3 refills | Status: DC

## 2019-05-02 MED ORDER — TELMISARTAN 80 MG PO TABS: 160.0000 mg | ORAL_TABLET | Freq: Every day | ORAL | 3 refills | Status: DC

## 2019-05-02 NOTE — Patient Instructions (Addendum)
Medication Instructions:  INCREASE LABETALOL TO 200 MG TWICE DAILY  *If you need a refill on your cardiac medications before your next appointment, please call your pharmacy*  Lab Work: If you have labs (blood work) drawn today and your tests are completely normal, you will receive your results only by: Marland Kitchen MyChart Message (if you have MyChart) OR . A paper copy in the mail If you have any lab test that is abnormal or we need to change your treatment, we will call you to review the results.  Follow-Up: At [facility], you and your health needs are our priority.  As part of our continuing mission to provide you with exceptional heart care, we have created designated Provider Care Teams.  These Care Teams include your primary Cardiologist (physician) and Advanced Practice Providers (APPs -  Physician Assistants and Nurse Practitioners) who all work together to provide you with the care you need, when you need it.  Your next appointment:   6 month(s)  The format for your next appointment:   Either In Person or Virtual  Provider:   You may see Kirk Ruths MD or one of the following Advanced Practice Providers on your designated Care Team:    Kerin Ransom, PA-C  [place], [place]  Coletta Memos, [place]

## 2019-05-04 ENCOUNTER — Ambulatory Visit: Payer: Medicare Other

## 2019-05-05 ENCOUNTER — Ambulatory Visit: Payer: Medicare Other

## 2019-05-09 ENCOUNTER — Ambulatory Visit: Payer: Medicare Other | Attending: Internal Medicine

## 2019-05-09 DIAGNOSIS — Z23 Encounter for immunization: Secondary | ICD-10-CM | POA: Insufficient documentation

## 2019-05-09 NOTE — Progress Notes (Signed)
   Covid-19 Vaccination Clinic  Name:  Jonathon Armstrong    MRN: [id number] DOB: Feb 26, 1951  05/09/2019  Jonathon Armstrong was observed post Covid-19 immunization for 15 minutes without incidence. He was provided with Vaccine Information Sheet and instruction to access the V-Safe system.   Jonathon Armstrong was instructed to call 911 with any severe reactions post vaccine: Marland Kitchen Difficulty breathing  . Swelling of your face and throat  . A fast heartbeat  . A bad rash all over your body  . Dizziness and weakness    Immunizations Administered    Name Date Dose VIS Date Route   Pfizer COVID-19 Vaccine 05/09/2019  1:11 PM 0.3 mL 03/04/2019 Intramuscular   Manufacturer: [place]   Lot: [id number]   [place]vin: [id number]

## 2019-05-15 NOTE — Patient Instructions (Addendum)
  Blood work was ordered.     Medications reviewed and updated.  Changes include :   None     Please followup in 6 months

## 2019-05-15 NOTE — Progress Notes (Signed)
Subjective:    Patient ID: Jonathon Armstrong, male    DOB: 1950/12/20, 69 y.o.   MRN: [id number]  HPI The patient is here for follow up of their chronic medical problems, including diabetes, hypertension, hyperlipidemia.  He is taking all of his medications as prescribed.    He is exercising regularly.   He is walking 3-4 times a week.  He is compliant with a low sugar diet.    Last August he had right lateral foot pain and left knee pain.  Both came and went.  He took advil.  He has not had any episodes since then.   Medications and allergies reviewed with patient and updated if appropriate.  Patient Active Problem List   Diagnosis Date Noted  . Pain and swelling of right lower leg 02/24/2019  . Chronic Eustachian tube dysfunction, right 01/18/2018  . Bilateral hearing loss 01/18/2018  . Cough 06/15/2015  . Diabetes ([place]) 02/25/2013  . Hyperlipidemia 12/05/2010  . NONSPECIFIC ABNORMAL ELECTROCARDIOGRAM 08/13/2009  . Hyperuricemia 02/07/2008  . DIVERTICULOSIS, COLON 10/08/2007  . History of colonic polyps 10/08/2007  . Essential hypertension 03/26/2007  . ASYSTOLE 02/22/2007  . Obstructive sleep apnea 08/28/2006    Current Outpatient Medications on File Prior to Visit  Medication Sig Dispense Refill  . albuterol (VENTOLIN HFA) 108 (90 Base) MCG/ACT inhaler Inhale 2 puffs into the lungs every 6 (six) hours as needed for wheezing or shortness of breath. 1 Inhaler 5  . amoxicillin (AMOXIL) 500 MG capsule Take 500 mg by mouth as needed.    . ARNUITY ELLIPTA 100 MCG/ACT AEPB INHALE 1 PUFF ONCE DAILY. 30 each 5  . aspirin (BAYER LOW STRENGTH) 81 MG EC tablet Take 81 mg by mouth daily.      Marland Kitchen ezetimibe-simvastatin (VYTORIN) 10-20 MG tablet TAKE (1) TABLET DAILY AT BEDTIME. 90 tablet 1  . labetalol (NORMODYNE) 200 MG tablet Take 1 tablet (200 mg total) by mouth 2 (two) times daily. T 180 tablet 3  . metFORMIN (GLUCOPHAGE) 500 MG tablet TAKE 2 TABLETS TWICE DAILY WITH FOOD. 360  tablet 1  . spironolactone (ALDACTONE) 25 MG tablet Take 1 tablet (25 mg total) by mouth daily. 90 tablet 1  . telmisartan (MICARDIS) 80 MG tablet Take 1 tablet (80 mg total) by mouth daily. 90 tablet 3   No current facility-administered medications on file prior to visit.    Past Medical History:  Diagnosis Date  . Allergy    seasonal  . Asystole ([place])    "vagal response"post nasal polypectomy  . Diabetes mellitus without complication ([place])    pre but take metformin  . Diverticulosis of colon   . FH: colonic polyps    hx of  . Hyperlipidemia   . Hypertension   . Mitral regurgitation    mild & aortic Valve calcification on 2d echo;sbe prophylaxis  . Parotitis, acute 2013  . Seasonal rhinitis   . Sleep apnea    CPAP    Past Surgical History:  Procedure Laterality Date  . APPENDECTOMY    . asytole post nasal polypectomy  2008  . colonoscopy with polypectomy  2010   Dr Olevia Perches  . heriorraphy     x2  . HERNIA REPAIR    . MIDDLE EAR SURGERY     TM replacement  . NASAL SINUS SURGERY  1994, [id number]   X3  . TONSILLECTOMY AND ADENOIDECTOMY      Social History   Socioeconomic History  . Marital status: Married  Spouse name: Not on file  . Number of children: 2  . Years of education: 28  . Highest education level: Not on file  Occupational History  . Occupation: Retired    Fish farm manager: RETIRED  Tobacco Use  . Smoking status: Never Smoker  . Smokeless tobacco: Never Used  Substance and Sexual Activity  . Alcohol use: Yes    Alcohol/week: 2.0 standard drinks    Types: 1 Glasses of wine, 1 Shots of liquor per week    Comment: socially < 2/ week  . Drug use: No  . Sexual activity: Yes  Other Topics Concern  . Not on file  Social History Narrative   Fun: Water, swimming, [employer]; Psychologist, occupational work    Denies abuse and feels safe at home.   Regular exercise      Social Determinants of Health   Financial Resource Strain:   . Difficulty of Paying Living  Expenses: Not on file  Food Insecurity:   . Worried About Charity fundraiser in the Last Year: Not on file  . Ran Out of Food in the Last Year: Not on file  Transportation Needs:   . Lack of Transportation (Medical): Not on file  . Lack of Transportation (Non-Medical): Not on file  Physical Activity:   . Days of Exercise per Week: Not on file  . Minutes of Exercise per Session: Not on file  Stress:   . Feeling of Stress : Not on file  Social Connections:   . Frequency of Communication with Friends and Family: Not on file  . Frequency of Social Gatherings with Friends and Family: Not on file  . Attends Religious Services: Not on file  . Active Member of Clubs or Organizations: Not on file  . Attends Archivist Meetings: Not on file  . Marital Status: Not on file    Family History  Problem Relation Age of Onset  . Cancer Mother        liver &multiple myeloma  . Heart disease Mother        CABG, Angina  . Hyperlipidemia Mother   . Hypertension Father   . Heart disease Father        MI in 56"s  . Dementia Father   . Cancer Maternal Grandfather        bone  . Other Sister        perforated bowel  . Other Sister        cns aneurysm  . Diabetes Neg Hx   . Stroke Neg Hx   . Colon cancer Neg Hx     Review of Systems  Constitutional: Negative for chills and fever.  Respiratory: Negative for cough, shortness of breath and wheezing.   Cardiovascular: Negative for chest pain, palpitations and leg swelling.  Neurological: Negative for dizziness, light-headedness and headaches.       Objective:   Vitals:   05/17/19 0858  BP: 122/74  Pulse: 81  Resp: 16  Temp: 98.3 F (36.8 C)  SpO2: 97%   BP Readings from Last 3 Encounters:  05/17/19 122/74  05/02/19 (!) 142/80  03/16/19 (!) 149/88   Wt Readings from Last 3 Encounters:  05/17/19 220 lb 9.6 oz (100.1 kg)  05/02/19 223 lb (101.2 kg)  04/19/19 225 lb (102.1 kg)   Body mass index is 31.65 kg/m.    Physical Exam    Constitutional: Appears well-developed and well-nourished. No distress.  HENT:  Head: Normocephalic and atraumatic.  Neck: Neck supple.  No tracheal deviation present. No thyromegaly present.  No cervical lymphadenopathy Cardiovascular: Normal rate, regular rhythm and normal heart sounds.   No murmur heard. No carotid bruit .  No edema Pulmonary/Chest: Effort normal and breath sounds normal. No respiratory distress. No has no wheezes. No rales.  Skin: Skin is warm and dry. Not diaphoretic.  Psychiatric: Normal mood and affect. Behavior is normal.      Assessment & Plan:    See Problem List for Assessment and Plan of chronic medical problems.    This visit occurred during the SARS-CoV-2 public health emergency.  Safety protocols were in place, including screening questions prior to the visit, additional usage of staff PPE, and extensive cleaning of exam room while observing appropriate contact time as indicated for disinfecting solutions.

## 2019-05-17 ENCOUNTER — Other Ambulatory Visit: Payer: Self-pay

## 2019-05-17 ENCOUNTER — Encounter: Payer: Self-pay | Admitting: Internal Medicine

## 2019-05-17 ENCOUNTER — Ambulatory Visit (INDEPENDENT_AMBULATORY_CARE_PROVIDER_SITE_OTHER): Payer: Medicare Other | Admitting: Internal Medicine

## 2019-05-17 VITALS — BP 122/74 | HR 81 | Temp 98.3°F | Resp 16 | Ht 70.0 in | Wt 220.6 lb

## 2019-05-17 DIAGNOSIS — E119 Type 2 diabetes mellitus without complications: Secondary | ICD-10-CM

## 2019-05-17 DIAGNOSIS — E782 Mixed hyperlipidemia: Secondary | ICD-10-CM

## 2019-05-17 DIAGNOSIS — R931 Abnormal findings on diagnostic imaging of heart and coronary circulation: Secondary | ICD-10-CM | POA: Diagnosis not present

## 2019-05-17 DIAGNOSIS — I10 Essential (primary) hypertension: Secondary | ICD-10-CM

## 2019-05-17 LAB — COMPREHENSIVE METABOLIC PANEL
ALT: 22 U/L (ref 0–53)
AST: 18 U/L (ref 0–37)
Albumin: 4.9 g/dL (ref 3.5–5.2)
Alkaline Phosphatase: 75 U/L (ref 39–117)
BUN: 23 mg/dL (ref 6–23)
CO2: 26 mEq/L (ref 19–32)
Calcium: 10.2 mg/dL (ref 8.4–10.5)
Chloride: 103 mEq/L (ref 96–112)
Creatinine, Ser: 1.31 mg/dL (ref 0.40–1.50)
GFR: 54.12 mL/min — ABNORMAL LOW (ref >60.00)
Glucose, Bld: 142 mg/dL — ABNORMAL HIGH (ref 70–99)
Potassium: 4.5 mEq/L (ref 3.5–5.1)
Sodium: 138 mEq/L (ref 135–145)
Total Bilirubin: 0.6 mg/dL (ref 0.2–1.2)
Total Protein: 7.3 g/dL (ref 6.0–8.3)

## 2019-05-17 LAB — LIPID PANEL
Cholesterol: 99 mg/dL (ref 0–200)
HDL: 28 mg/dL — ABNORMAL LOW (ref >39.00)
NonHDL: 71.19
Total CHOL/HDL Ratio: 4
Triglycerides: 208 mg/dL — ABNORMAL HIGH (ref 0.0–149.0)
VLDL: 41.6 mg/dL — ABNORMAL HIGH (ref 0.0–40.0)

## 2019-05-17 LAB — HEMOGLOBIN A1C: Hgb A1c MFr Bld: 7.1 % — ABNORMAL HIGH (ref 4.6–6.5)

## 2019-05-17 LAB — LDL CHOLESTEROL, DIRECT: Direct LDL: 47 mg/dL

## 2019-05-17 NOTE — Assessment & Plan Note (Signed)
Chronic Check lipid panel  Continue daily statin Regular exercise and healthy diet encouraged  

## 2019-05-17 NOTE — Assessment & Plan Note (Signed)
Chronic BP well controlled Current regimen effective and well tolerated Continue current medications at current doses cmp  

## 2019-05-17 NOTE — Assessment & Plan Note (Signed)
Chronic Check a1c Low sugar / carb diet Stressed regular exercise Continue metformin

## 2019-05-21 ENCOUNTER — Encounter: Payer: Self-pay | Admitting: Internal Medicine

## 2019-06-04 ENCOUNTER — Other Ambulatory Visit: Payer: Self-pay | Admitting: Internal Medicine

## 2019-06-20 ENCOUNTER — Other Ambulatory Visit: Payer: Self-pay | Admitting: Internal Medicine

## 2019-06-21 ENCOUNTER — Encounter: Payer: Self-pay | Admitting: *Deleted

## 2019-06-21 DIAGNOSIS — R911 Solitary pulmonary nodule: Secondary | ICD-10-CM

## 2019-06-22 ENCOUNTER — Other Ambulatory Visit: Payer: Self-pay | Admitting: *Deleted

## 2019-06-22 ENCOUNTER — Telehealth: Payer: Self-pay | Admitting: Cardiology

## 2019-06-22 DIAGNOSIS — I10 Essential (primary) hypertension: Secondary | ICD-10-CM

## 2019-06-22 NOTE — Telephone Encounter (Signed)
Left message for patient to call regarding appointment for CT Chest ordered by Dr. Stanford Breed due in April, 2021

## 2019-07-04 ENCOUNTER — Encounter: Payer: Self-pay | Admitting: Internal Medicine

## 2019-07-05 ENCOUNTER — Ambulatory Visit (INDEPENDENT_AMBULATORY_CARE_PROVIDER_SITE_OTHER): Payer: Medicare Other | Admitting: Internal Medicine

## 2019-07-05 ENCOUNTER — Encounter: Payer: Self-pay | Admitting: Internal Medicine

## 2019-07-05 DIAGNOSIS — R931 Abnormal findings on diagnostic imaging of heart and coronary circulation: Secondary | ICD-10-CM | POA: Diagnosis not present

## 2019-07-05 DIAGNOSIS — J019 Acute sinusitis, unspecified: Secondary | ICD-10-CM | POA: Diagnosis not present

## 2019-07-05 MED ORDER — AZITHROMYCIN 250 MG PO TABS: ORAL_TABLET | ORAL | 0 refills | Status: DC

## 2019-07-05 NOTE — Progress Notes (Signed)
Virtual Visit via Video Note  I connected with Jonathon Armstrong on 07/05/19 at  2:15 PM EDT by a video enabled telemedicine application and verified that I am speaking with the correct person using two identifiers.   I discussed the limitations of evaluation and management by telemedicine and the availability of in person appointments. The patient expressed understanding and agreed to proceed.  Present for the visit:  Myself, Dr Billey Gosling, Lolita Rieger.  The patient is currently at home and I am in the office.    No referring provider.    History of Present Illness: This is an acute visit for sinus infection.   His symptoms started last Thursday after watching his grandkids.  They are both in daycare.  He is pretty confident he has a sinus infection.  He does have a history of them.  He states nasal congestion with discolored mucus, sore throat and a feeling of the throat tightening, significant drainage, hoarseness.  He denies any symptoms in his chest.  He has been taking Flonase and an oral antihistamine without improvement.  He notices history and is confident he has a sinus infection.   Review of Systems  Constitutional: Negative for chills and fever.  HENT: Positive for congestion (discolored mucux) and sore throat (feels like it is tightening). Negative for ear pain and sinus pain.        Voice change  Respiratory: Negative for cough, shortness of breath and wheezing.   Neurological: Negative for dizziness.     Social History   Socioeconomic History  . Marital status: Married    Spouse name: Not on file  . Number of children: 2  . Years of education: 75  . Highest education level: Not on file  Occupational History  . Occupation: Retired    Fish farm manager: RETIRED  Tobacco Use  . Smoking status: Never Smoker  . Smokeless tobacco: Never Used  Substance and Sexual Activity  . Alcohol use: Yes    Alcohol/week: 2.0 standard drinks    Types: 1 Glasses of wine, 1 Shots of  liquor per week    Comment: socially < 2/ week  . Drug use: No  . Sexual activity: Yes  Other Topics Concern  . Not on file  Social History Narrative   Fun: Water, swimming, [employer]; Psychologist, occupational work    Denies abuse and feels safe at home.   Regular exercise      Social Determinants of Health   Financial Resource Strain:   . Difficulty of Paying Living Expenses:   Food Insecurity:   . Worried About Charity fundraiser in the Last Year:   . Arboriculturist in the Last Year:   Transportation Needs:   . Film/video editor (Medical):   Marland Kitchen Lack of Transportation (Non-Medical):   Physical Activity:   . Days of Exercise per Week:   . Minutes of Exercise per Session:   Stress:   . Feeling of Stress :   Social Connections:   . Frequency of Communication with Friends and Family:   . Frequency of Social Gatherings with Friends and Family:   . Attends Religious Services:   . Active Member of Clubs or Organizations:   . Attends Archivist Meetings:   Marland Kitchen Marital Status:      Observations/Objective: Appears well in NAD Breathing normally, speaking in full sentences Skin appears warm and dry  Assessment and Plan:  See Problem List for Assessment and Plan of chronic medical  problems.   Follow Up Instructions:    I discussed the assessment and treatment plan with the patient. The patient was provided an opportunity to ask questions and all were answered. The patient agreed with the plan and demonstrated an understanding of the instructions.   The patient was advised to call back or seek an in-person evaluation if the symptoms worsen or if the condition fails to improve as anticipated.    [name], MD

## 2019-07-05 NOTE — Assessment & Plan Note (Signed)
Likely bacterial  Start Z-Pak otc cold medications/allergy medications Rest, fluid Call if no improvement

## 2019-07-11 ENCOUNTER — Ambulatory Visit
Admission: RE | Admit: 2019-07-11 | Discharge: 2019-07-11 | Disposition: A | Discharge status: Home / Self Care | Payer: Medicare Other | Source: Ambulatory Visit | Attending: Cardiology | Admitting: Cardiology

## 2019-07-11 DIAGNOSIS — R911 Solitary pulmonary nodule: Secondary | ICD-10-CM | POA: Diagnosis not present

## 2019-07-12 ENCOUNTER — Telehealth: Payer: Self-pay | Admitting: Pulmonary Disease

## 2019-07-12 ENCOUNTER — Telehealth: Payer: Self-pay | Admitting: *Deleted

## 2019-07-12 DIAGNOSIS — R911 Solitary pulmonary nodule: Secondary | ICD-10-CM

## 2019-07-12 NOTE — Telephone Encounter (Signed)
Call tomorrow after heartcare opens

## 2019-07-12 NOTE — Telephone Encounter (Addendum)
Left message for pt to call, appointment made with  pulmonary on may 10- 21 @ 3 pm with dr Ander Slade. They will send a message for dr Ander Slade to review the CT scan to see if patient needs to be seen sooner.   ----- Message from Lelon Perla, MD sent at 07/12/2019  7:24 AM EDT ----- Please arrange pulmonary evaluation ASAP Kirk Ruths

## 2019-07-13 NOTE — Telephone Encounter (Signed)
Follow Up:; ° ° °Returning your call. °

## 2019-07-13 NOTE — Telephone Encounter (Signed)
[facility] called back-- I informed them of new appt time. They are noting it and letting cardiologist know. No need to call back unless there is additional information to share.

## 2019-07-13 NOTE — Telephone Encounter (Signed)
He needs an earlier appointment.  He will likely need assessment for Electronavigational bronchoscopy (ENB), and therefore should have appointment with Dr. Lamonte Sakai or [name].  Could also schedule appointment with NP to expedite process if nothing available for Dr. Lamonte Sakai or [name].

## 2019-07-13 NOTE — Telephone Encounter (Signed)
[name], you have a held spot for 330pm on 4/29. Would you be willing to see the patient then? Please advise. Thank you!

## 2019-07-13 NOTE — Telephone Encounter (Signed)
Yes, please schedule.  Thanks Garner Nash, DO [facility] 07/13/2019 1:55 PM

## 2019-07-13 NOTE — Telephone Encounter (Signed)
Serena from [facility] returning call about the patient being seen sooner. She states she would be able to get him in 07/21/2019 at 3:30pm with [name].

## 2019-07-13 NOTE — Telephone Encounter (Signed)
Spoke with Opal Sidles from [facility] who wanted to make MD and nurse aware that Roanna Epley was able to make pt a sooner appointment. Pt scheduled for 4/29 at 3:30 with [name]

## 2019-07-13 NOTE — Telephone Encounter (Signed)
Patient is aware 

## 2019-07-13 NOTE — Telephone Encounter (Signed)
Follow up ° ° °Patient is returning your call per the previous message. Please call. °

## 2019-07-13 NOTE — Telephone Encounter (Signed)
Called to speak with Jonathon Armstrong but she was unavailable. I spoke with a nurse at cardiology. She will relay the information to [place]. Patient has already been scheduled for 4/29 at 330pm with [name].   Will leave this encounter open for follow up.

## 2019-07-13 NOTE — Telephone Encounter (Signed)
Spoke with pt, aware of results and follow up appointment. 

## 2019-07-13 NOTE — Telephone Encounter (Signed)
Called [facility] and was on hold > 10 min with callers still ahead of me.   Pt had a CT chest on 4/19 and was referred to our office for a lung nodule seen on CT. Pt seen by Dr. Halford Chessman in the past for OSA and cough. Pt currently has a follow up on 5/10 with Dr. Ander Slade (listed as a consult, this will need to be changed to OV when rescheduling visit).   Dr. Halford Chessman please advise if this pt needs to be seen sooner. Your first available isnt until 6/1, ok to put with APP?

## 2019-07-21 ENCOUNTER — Ambulatory Visit (INDEPENDENT_AMBULATORY_CARE_PROVIDER_SITE_OTHER): Payer: Medicare Other | Admitting: Pulmonary Disease

## 2019-07-21 ENCOUNTER — Encounter: Payer: Self-pay | Admitting: Pulmonary Disease

## 2019-07-21 ENCOUNTER — Other Ambulatory Visit: Payer: Self-pay

## 2019-07-21 VITALS — BP 136/78 | HR 61 | Ht 70.0 in | Wt 227.6 lb

## 2019-07-21 DIAGNOSIS — R911 Solitary pulmonary nodule: Secondary | ICD-10-CM

## 2019-07-21 DIAGNOSIS — Z789 Other specified health status: Secondary | ICD-10-CM

## 2019-07-21 NOTE — Progress Notes (Signed)
Synopsis: Referred in April 2021 for lung nodule by Lelon Perla, MD  Subjective:   PATIENT ID: Jonathon Armstrong GENDER: male DOB: [birth year], MRN: [id number]  Chief Complaint  Patient presents with  . Consult    referred for lung nodule on CT     Is a 69 year old gentleman with a past medical history of diabetes, hypertension, hyperlipidemia.  Had a CT coronary of the heart which revealed a lower lobe pulmonary nodule.  This was evaluated by cardiology.  Patient had follow-up CT imaging of the chest in April 2021 which revealed stability of this nodule at approximately 13 mm in size.  Patient has no complaints at this time from respiratory standpoint.     Past Medical History:  Diagnosis Date  . Allergy    seasonal  . Asystole ([place])    "vagal response"post nasal polypectomy  . Diabetes mellitus without complication ([place])    pre but take metformin  . Diverticulosis of colon   . FH: colonic polyps    hx of  . Hyperlipidemia   . Hypertension   . Mitral regurgitation    mild & aortic Valve calcification on 2d echo;sbe prophylaxis  . Parotitis, acute 2013  . Seasonal rhinitis   . Sleep apnea    CPAP     Family History  Problem Relation Age of Onset  . Cancer Mother        liver &multiple myeloma  . Heart disease Mother        CABG, Angina  . Hyperlipidemia Mother   . Hypertension Father   . Heart disease Father        MI in 6"s  . Dementia Father   . Cancer Maternal Grandfather        bone  . Other Sister        perforated bowel  . Other Sister        cns aneurysm  . Diabetes Neg Hx   . Stroke Neg Hx   . Colon cancer Neg Hx      Past Surgical History:  Procedure Laterality Date  . APPENDECTOMY    . asytole post nasal polypectomy  2008  . colonoscopy with polypectomy  2010   Dr Olevia Perches  . heriorraphy     x2  . HERNIA REPAIR    . MIDDLE EAR SURGERY     TM replacement  . NASAL SINUS SURGERY  1994, [id number]   X3  . TONSILLECTOMY AND ADENOIDECTOMY       Social History   Socioeconomic History  . Marital status: Married    Spouse name: Not on file  . Number of children: 2  . Years of education: 2  . Highest education level: Not on file  Occupational History  . Occupation: Retired    Fish farm manager: RETIRED  Tobacco Use  . Smoking status: Never Smoker  . Smokeless tobacco: Never Used  Substance and Sexual Activity  . Alcohol use: Yes    Alcohol/week: 2.0 standard drinks    Types: 1 Glasses of wine, 1 Shots of liquor per week    Comment: socially < 2/ week  . Drug use: No  . Sexual activity: Yes  Other Topics Concern  . Not on file  Social History Narrative   Fun: Water, swimming, [employer]; Psychologist, occupational work    Denies abuse and feels safe at home.   Regular exercise      Social Determinants of Health   Financial Resource Strain:   . Difficulty  of Paying Living Expenses:   Food Insecurity:   . Worried About Charity fundraiser in the Last Year:   . Arboriculturist in the Last Year:   Transportation Needs:   . Film/video editor (Medical):   Marland Kitchen Lack of Transportation (Non-Medical):   Physical Activity:   . Days of Exercise per Week:   . Minutes of Exercise per Session:   Stress:   . Feeling of Stress :   Social Connections:   . Frequency of Communication with Friends and Family:   . Frequency of Social Gatherings with Friends and Family:   . Attends Religious Services:   . Active Member of Clubs or Organizations:   . Attends Archivist Meetings:   Marland Kitchen Marital Status:   Intimate Partner Violence:   . Fear of Current or Ex-Partner:   . Emotionally Abused:   Marland Kitchen Physically Abused:   . Sexually Abused:      Allergies  Allergen Reactions  . Diltiazem Hcl     ? symptoms  . Penicillins     Rash with Amoxicillin with Mono in college     Outpatient Medications Prior to Visit  Medication Sig Dispense Refill  . albuterol (VENTOLIN HFA) 108 (90 Base) MCG/ACT inhaler Inhale 2 puffs into the lungs every 6  (six) hours as needed for wheezing or shortness of breath. 1 Inhaler 5  . ARNUITY ELLIPTA 100 MCG/ACT AEPB INHALE 1 PUFF ONCE DAILY. 30 each 5  . aspirin (BAYER LOW STRENGTH) 81 MG EC tablet Take 81 mg by mouth daily.      Marland Kitchen ezetimibe-simvastatin (VYTORIN) 10-20 MG tablet TAKE (1) TABLET DAILY AT BEDTIME. 90 tablet 0  . labetalol (NORMODYNE) 200 MG tablet Take 1 tablet (200 mg total) by mouth 2 (two) times daily. T 180 tablet 3  . metFORMIN (GLUCOPHAGE) 500 MG tablet TAKE 2 TABLETS TWICE DAILY WITH FOOD. 360 tablet 1  . spironolactone (ALDACTONE) 25 MG tablet TAKE 1 TABLET ONCE DAILY. 90 tablet 1  . telmisartan (MICARDIS) 80 MG tablet Take 1 tablet (80 mg total) by mouth daily. 90 tablet 3  . amoxicillin (AMOXIL) 500 MG capsule Take 500 mg by mouth as needed.    Marland Kitchen azithromycin (ZITHROMAX) 250 MG tablet Take two tabs the first day and then one tab daily for four days 6 tablet 0   No facility-administered medications prior to visit.    Review of Systems  Constitutional: Negative for chills, fever, malaise/fatigue and weight loss.  HENT: Negative for hearing loss, sore throat and tinnitus.   Eyes: Negative for blurred vision and double vision.  Respiratory: Negative for cough, hemoptysis, sputum production, shortness of breath, wheezing and stridor.   Cardiovascular: Negative for chest pain, palpitations, orthopnea, leg swelling and PND.  Gastrointestinal: Negative for abdominal pain, constipation, diarrhea, heartburn, nausea and vomiting.  Genitourinary: Negative for dysuria, hematuria and urgency.  Musculoskeletal: Negative for joint pain and myalgias.  Skin: Negative for itching and rash.  Neurological: Negative for dizziness, tingling, weakness and headaches.  Endo/Heme/Allergies: Negative for environmental allergies. Does not bruise/bleed easily.  Psychiatric/Behavioral: Negative for depression. The patient is not nervous/anxious and does not have insomnia.   All other systems reviewed  and are negative.    Objective:  Physical Exam Vitals reviewed.  Constitutional:      General: He is not in acute distress.    Appearance: He is well-developed.  HENT:     Head: Normocephalic and atraumatic.  Eyes:  General: No scleral icterus.    Conjunctiva/sclera: Conjunctivae normal.     Pupils: Pupils are equal, round, and reactive to light.  Neck:     Vascular: No JVD.     Trachea: No tracheal deviation.  Cardiovascular:     Rate and Rhythm: Normal rate and regular rhythm.     Heart sounds: Normal heart sounds. No murmur.  Pulmonary:     Effort: Pulmonary effort is normal. No tachypnea, accessory muscle usage or respiratory distress.     Breath sounds: Normal breath sounds. No stridor. No wheezing, rhonchi or rales.  Musculoskeletal:        General: No tenderness.     Cervical back: Neck supple.  Lymphadenopathy:     Cervical: No cervical adenopathy.  Skin:    General: Skin is warm and dry.     Capillary Refill: Capillary refill takes less than 2 seconds.     Findings: No rash.  Neurological:     Mental Status: He is alert and oriented to person, place, and time.  Psychiatric:        Behavior: Behavior normal.      Vitals:   07/21/19 1527  BP: 136/78  Pulse: 61  SpO2: 98%  Weight: 227 lb 9.6 oz (103.2 kg)  Height: '5\' 10"'  (1.778 m)   98% on RA BMI Readings from Last 3 Encounters:  07/21/19 32.66 kg/m  05/17/19 31.65 kg/m  05/02/19 32.00 kg/m   Wt Readings from Last 3 Encounters:  07/21/19 227 lb 9.6 oz (103.2 kg)  05/17/19 220 lb 9.6 oz (100.1 kg)  05/02/19 223 lb (101.2 kg)     CBC    Component Value Date/Time   WBC 7.0 11/12/2018 0934   RBC 4.34 11/12/2018 0934   HGB 13.4 11/12/2018 0934   HCT 38.5 (L) 11/12/2018 0934   PLT 156.0 11/12/2018 0934   MCV 88.6 11/12/2018 0934   MCHC 35.0 11/12/2018 0934   RDW 13.8 11/12/2018 0934   LYMPHSABS 1.9 11/12/2018 0934   MONOABS 0.6 11/12/2018 0934   EOSABS 0.1 11/12/2018 0934   BASOSABS  0.0 11/12/2018 0934     Chest Imaging: 07/11/2019 CT chest: Lower lobe subpleural 1.3 x 1.2 cm left lower lobe superior segment lung nodule. The patient's images have been independently reviewed by me.    Pulmonary Functions Testing Results: No flowsheet data found.  FeNO: None   Pathology: None   Echocardiogram: None   Heart Catheterization: None     Assessment & Plan:     ICD-10-CM   1. Lung nodule  R91.1 NM PET Image Initial (PI) Skull Base To Thigh  2. Non-smoker  Z78.9     Discussion:  This is a 69 year old gentleman seen for incidental found lung nodule.  Lifelong non-smoker.  Today in the office we used a solitary pulmonary nodule risk calculator to discuss probability of malignancy.  Due to patient's non-smoking status.  Plan: We will recommend a PET scan for further evaluation. We will call patient with PET scan results. Pending PET scan results make further recommendations. If there is low-level PET uptake can consider follow-up CT scan imaging in approximately 6 months. If there is stability at that point would consider 52-monthfollow-up.  If stable for greater than 2 years no additional follow-up needed.  Follow up with Dr. SHalford Chessmanas scheduled.     Current Outpatient Medications:  .  albuterol (VENTOLIN HFA) 108 (90 Base) MCG/ACT inhaler, Inhale 2 puffs into the lungs every 6 (six) hours as  needed for wheezing or shortness of breath., Disp: 1 Inhaler, Rfl: 5 .  ARNUITY ELLIPTA 100 MCG/ACT AEPB, INHALE 1 PUFF ONCE DAILY., Disp: 30 each, Rfl: 5 .  aspirin (BAYER LOW STRENGTH) 81 MG EC tablet, Take 81 mg by mouth daily.  , Disp: , Rfl:  .  ezetimibe-simvastatin (VYTORIN) 10-20 MG tablet, TAKE (1) TABLET DAILY AT BEDTIME., Disp: 90 tablet, Rfl: 0 .  labetalol (NORMODYNE) 200 MG tablet, Take 1 tablet (200 mg total) by mouth 2 (two) times daily. T, Disp: 180 tablet, Rfl: 3 .  metFORMIN (GLUCOPHAGE) 500 MG tablet, TAKE 2 TABLETS TWICE DAILY WITH FOOD., Disp: 360  tablet, Rfl: 1 .  spironolactone (ALDACTONE) 25 MG tablet, TAKE 1 TABLET ONCE DAILY., Disp: 90 tablet, Rfl: 1 .  telmisartan (MICARDIS) 80 MG tablet, Take 1 tablet (80 mg total) by mouth daily., Disp: 90 tablet, Rfl: 3   Garner Nash, DO [facility] 07/21/2019 3:40 PM

## 2019-07-21 NOTE — Patient Instructions (Addendum)
Thank you for visiting [name] at [facility]. Today we recommend the following:  Orders Placed This Encounter  Procedures  . NM PET Image Initial (PI) Skull Base To Thigh   I will call you with the PET Scan results  Return in about 6 months (around 01/20/2020) for with APP or [name].    Please do your part to reduce the spread of COVID-19.

## 2019-08-01 ENCOUNTER — Institutional Professional Consult (permissible substitution): Payer: Medicare Other | Admitting: Pulmonary Disease

## 2019-08-03 ENCOUNTER — Other Ambulatory Visit: Payer: Self-pay

## 2019-08-03 ENCOUNTER — Telehealth: Payer: Self-pay | Admitting: Pulmonary Disease

## 2019-08-03 ENCOUNTER — Ambulatory Visit (HOSPITAL_COMMUNITY)
Admission: RE | Admit: 2019-08-03 | Discharge: 2019-08-03 | Disposition: A | Discharge status: Home / Self Care | Payer: Medicare Other | Source: Ambulatory Visit | Attending: Pulmonary Disease | Admitting: Pulmonary Disease

## 2019-08-03 DIAGNOSIS — K5730 Diverticulosis of large intestine without perforation or abscess without bleeding: Secondary | ICD-10-CM | POA: Diagnosis not present

## 2019-08-03 DIAGNOSIS — N400 Enlarged prostate without lower urinary tract symptoms: Secondary | ICD-10-CM | POA: Diagnosis not present

## 2019-08-03 DIAGNOSIS — I700 Atherosclerosis of aorta: Secondary | ICD-10-CM | POA: Diagnosis not present

## 2019-08-03 DIAGNOSIS — R911 Solitary pulmonary nodule: Secondary | ICD-10-CM | POA: Diagnosis not present

## 2019-08-03 DIAGNOSIS — K8020 Calculus of gallbladder without cholecystitis without obstruction: Secondary | ICD-10-CM | POA: Insufficient documentation

## 2019-08-03 DIAGNOSIS — N200 Calculus of kidney: Secondary | ICD-10-CM | POA: Insufficient documentation

## 2019-08-03 DIAGNOSIS — N281 Cyst of kidney, acquired: Secondary | ICD-10-CM | POA: Diagnosis not present

## 2019-08-03 LAB — GLUCOSE, CAPILLARY: Glucose-Capillary: 160 mg/dL — ABNORMAL HIGH (ref 70–99)

## 2019-08-03 MED ORDER — FLUDEOXYGLUCOSE F - 18 (FDG) INJECTION
11.3200 | Freq: Once | INTRAVENOUS | Status: AC | PRN
  Administered 2019-08-03: 11.32 via INTRAVENOUS

## 2019-08-03 NOTE — Telephone Encounter (Signed)
PCCM:  I attempted to call the patient regarding his PET CT results. I left a message for him to call me back.   98mm lesion stable in size.  Low level PET uptake I suspect could be an indolent neoplasm or neuroendocrine tumor.   Referral placed to Dr. Kipp Brood for consideration of RATS vs close CT follow up  Garner Nash, [facility] 08/03/2019 5:30 PM

## 2019-08-04 NOTE — Telephone Encounter (Signed)
[name], please see pt's mychart message and advise on it.

## 2019-08-11 ENCOUNTER — Other Ambulatory Visit (HOSPITAL_COMMUNITY)
Admission: RE | Admit: 2019-08-11 | Discharge: 2019-08-11 | Disposition: A | Discharge status: Home / Self Care | Payer: Medicare Other | Source: Ambulatory Visit | Attending: Thoracic Surgery (Cardiothoracic Vascular Surgery) | Admitting: Thoracic Surgery (Cardiothoracic Vascular Surgery)

## 2019-08-11 ENCOUNTER — Other Ambulatory Visit: Payer: Self-pay

## 2019-08-11 ENCOUNTER — Institutional Professional Consult (permissible substitution) (INDEPENDENT_AMBULATORY_CARE_PROVIDER_SITE_OTHER): Payer: Medicare Other | Admitting: Thoracic Surgery (Cardiothoracic Vascular Surgery)

## 2019-08-11 ENCOUNTER — Other Ambulatory Visit: Payer: Self-pay | Admitting: *Deleted

## 2019-08-11 VITALS — BP 122/68 | HR 79 | Temp 97.3°F | Resp 20 | Ht 70.0 in | Wt 221.0 lb

## 2019-08-11 DIAGNOSIS — Z20822 Contact with and (suspected) exposure to covid-19: Secondary | ICD-10-CM | POA: Insufficient documentation

## 2019-08-11 DIAGNOSIS — R911 Solitary pulmonary nodule: Secondary | ICD-10-CM

## 2019-08-11 DIAGNOSIS — Z01812 Encounter for preprocedural laboratory examination: Secondary | ICD-10-CM | POA: Diagnosis not present

## 2019-08-11 NOTE — Progress Notes (Signed)
[address]       [place] [address]             [phone]                    Rishi C Ghazi Eckley Medical Record #[id number] Date of Birth: December 26, 1950  Referring: Garner Nash, DO Primary Care: [name], MD Primary Cardiologist: No primary care provider on file.  Chief Complaint:    Chief Complaint  Patient presents with  . Lung Lesion    Consultation, PET 08/03/19, Chest CT 07/11/19    History of Present Illness:    Jonathon Armstrong 69 y.o. male 69 year old male referred for surgical evaluation of a left lower lobe very nodule that was found incidentally in January of this year.  The nodule was 1.3 cm and remained stable subsequent left CT scans.  He did undergo a PET/CT which showed an SUV max 3 and no suspicious lymphadenopathy.  He denies any pulmonary or cardiovascular symptoms.  He also denies any neurologic symptoms.  15 years ago he had a vagal episode nasal procedure where he required chest compressions.  He has not had any other complications with anesthesia previously.    Smoking Hx: Lifelong non-smoker   Zubrod Score: At the time of surgery this patient's most appropriate activity status/level should be described as: '[x]'     0    Normal activity, no symptoms '[]'     1    Restricted in physical strenuous activity but ambulatory, able to do out light work '[]'     2    Ambulatory and capable of self care, unable to do work activities, up and about               >50 % of waking hours                              '[]'     3    Only limited self care, in bed greater than 50% of waking hours '[]'     4    Completely disabled, no self care, confined to bed or chair '[]'     5    Moribund   Past Medical History:  Diagnosis Date  . Allergy    seasonal  . Asystole ([place])    "vagal response"post nasal polypectomy  . Diabetes mellitus without complication ([place])    pre but take metformin  . Diverticulosis of colon   . FH: colonic polyps    hx of    . Hyperlipidemia   . Hypertension   . Mitral regurgitation    mild & aortic Valve calcification on 2d echo;sbe prophylaxis  . Parotitis, acute 2013  . Seasonal rhinitis   . Sleep apnea    CPAP    Past Surgical History:  Procedure Laterality Date  . APPENDECTOMY    . asytole post nasal polypectomy  2008  . colonoscopy with polypectomy  2010   Dr Olevia Perches  . heriorraphy     x2  . HERNIA REPAIR    . MIDDLE EAR SURGERY     TM replacement  . NASAL SINUS SURGERY  1994, [id number]   X3  . TONSILLECTOMY AND ADENOIDECTOMY      Family History  Problem Relation Age of Onset  . Cancer Mother        liver &multiple myeloma  . Heart disease Mother  CABG, Angina  . Hyperlipidemia Mother   . Hypertension Father   . Heart disease Father        MI in 2"s  . Dementia Father   . Cancer Maternal Grandfather        bone  . Other Sister        perforated bowel  . Other Sister        cns aneurysm  . Diabetes Neg Hx   . Stroke Neg Hx   . Colon cancer Neg Hx      Social History   Tobacco Use  Smoking Status Never Smoker  Smokeless Tobacco Never Used    Social History   Substance and Sexual Activity  Alcohol Use Yes  . Alcohol/week: 2.0 standard drinks  . Types: 1 Glasses of wine, 1 Shots of liquor per week   Comment: socially < 2/ week     Allergies  Allergen Reactions  . Diltiazem Hcl     ? symptoms  . Penicillins     Rash with Amoxicillin with Mono in college    Current Outpatient Medications  Medication Sig Dispense Refill  . albuterol (VENTOLIN HFA) 108 (90 Base) MCG/ACT inhaler Inhale 2 puffs into the lungs every 6 (six) hours as needed for wheezing or shortness of breath. 1 Inhaler 5  . ARNUITY ELLIPTA 100 MCG/ACT AEPB INHALE 1 PUFF ONCE DAILY. 30 each 5  . Ascorbic Acid (VITAMIN C) 1000 MG tablet Take 1,000 mg by mouth daily.    Marland Kitchen aspirin (BAYER LOW STRENGTH) 81 MG EC tablet Take 81 mg by mouth daily.      . cholecalciferol (VITAMIN D3) 25 MCG (1000  UNIT) tablet Take 1,000 Units by mouth daily.    Marland Kitchen ezetimibe-simvastatin (VYTORIN) 10-20 MG tablet TAKE (1) TABLET DAILY AT BEDTIME. 90 tablet 0  . labetalol (NORMODYNE) 200 MG tablet Take 1 tablet (200 mg total) by mouth 2 (two) times daily. T 180 tablet 3  . metFORMIN (GLUCOPHAGE) 500 MG tablet TAKE 2 TABLETS TWICE DAILY WITH FOOD. 360 tablet 1  . spironolactone (ALDACTONE) 25 MG tablet TAKE 1 TABLET ONCE DAILY. 90 tablet 1  . telmisartan (MICARDIS) 80 MG tablet Take 1 tablet (80 mg total) by mouth daily. 90 tablet 3  . Zinc Sulfate (ZINC 15 PO) Take 1 tablet by mouth daily.     No current facility-administered medications for this visit.    Review of Systems  Constitutional: Negative.   HENT: Negative.   Respiratory: Negative.   Cardiovascular: Negative.   Musculoskeletal: Negative.   Neurological: Negative.      PHYSICAL EXAMINATION: BP 122/68 (BP Location: Left Arm, Patient Position: Sitting, Cuff Size: Large)   Pulse 79   Temp (!) 97.3 F (36.3 C) (Temporal)   Resp 20   Ht '5\' 10"'  (1.778 m)   Wt 221 lb (100.2 kg)   SpO2 97% Comment: RA  BMI 31.71 kg/m  Physical Exam  Constitutional: He is oriented to person, place, and time. He appears well-developed and well-nourished. No distress.  HENT:  Head: Normocephalic and atraumatic.  Eyes: Conjunctivae are normal.  Neck: No tracheal deviation present.  Cardiovascular: Normal rate.  Respiratory: No respiratory distress.  GI: Soft. He exhibits no distension.  Musculoskeletal:        General: Normal range of motion.     Cervical back: Normal range of motion.  Neurological: He is alert and oriented to person, place, and time.  Skin: Skin is warm and dry. He is not diaphoretic.  Diagnostic Studies & Laboratory data:     Recent Radiology Findings:   NM PET Image Initial (PI) Skull Base To Thigh  Result Date: 08/03/2019 CLINICAL DATA:  Initial treatment strategy for left lower lobe pulmonary nodule. EXAM: NUCLEAR  MEDICINE PET SKULL BASE TO THIGH TECHNIQUE: 11.3 mCi F-18 FDG was injected intravenously. Full-ring PET imaging was performed from the skull base to thigh after the radiotracer. CT data was obtained and used for attenuation correction and anatomic localization. Fasting blood glucose: 160 mg/dl COMPARISON:  Chest CT on 07/11/2019 FINDINGS: Mediastinal blood-pool activity (background): SUV max = 3.5 Liver activity (reference): SUV max = N/A NECK:  No hypermetabolic lymph nodes or masses. Incidental CT findings:  None. CHEST: 13 mm pulmonary nodule in the superior segment of the left lower lobe remains stable in size. This shows low-grade FDG uptake, with SUV max of 3.0. No other suspicious pulmonary nodules or masses seen on CT images. No hypermetabolic lymphadenopathy. No evidence of pleural effusion. Incidental CT findings:  None. ABDOMEN/PELVIS: No abnormal hypermetabolic activity within the liver, pancreas, adrenal glands, or spleen. No hypermetabolic lymph nodes in the abdomen or pelvis. Incidental CT findings: Gallstones are seen, however there is no evidence of cholecystitis or biliary dilatation. Several tiny less than 5 mm left renal calculi are seen, however there is no evidence of ureteral calculi or hydronephrosis. Bilateral renal cysts noted. Aortic atherosclerosis demonstrated. Diffuse colonic diverticulosis is again noted, however there is no evidence of diverticulitis. Mildly enlarged prostate. SKELETON: No focal hypermetabolic bone lesions to suggest skeletal metastasis. Incidental CT findings:  None. IMPRESSION: Stable 13 mm left lower lobe pulmonary nodule shows low-grade FDG uptake, suspicious for low-grade bronchogenic carcinoma. No evidence of metastatic disease. Electronically Signed   By: Marlaine Hind M.D.   On: 08/03/2019 14:06       I have independently reviewed the above radiology studies  and reviewed the findings with the patient.   Recent Lab Findings: Lab Results  Component  Value Date   WBC 7.0 11/12/2018   HGB 13.4 11/12/2018   HCT 38.5 (L) 11/12/2018   PLT 156.0 11/12/2018   GLUCOSE 142 (H) 05/17/2019   CHOL 99 05/17/2019   TRIG 208.0 (H) 05/17/2019   HDL 28.00 (L) 05/17/2019   LDLDIRECT 47.0 05/17/2019   LDLCALC 54 09/18/2014   ALT 22 05/17/2019   AST 18 05/17/2019   NA 138 05/17/2019   K 4.5 05/17/2019   CL 103 05/17/2019   CREATININE 1.31 05/17/2019   BUN 23 05/17/2019   CO2 26 05/17/2019   TSH 1.41 11/12/2018   INR 1.0 02/05/2007   HGBA1C 7.1 (H) 05/17/2019     PFTs: Pending    Assessment / Plan:   69 year old male with left lower lobe pulmonary nodule with mild avidity on PET scan.  He is concerned that this may be lung cancer this would like it removed for the present biopsy.  The risk and benefits of bronchoscopy, robotic assisted left wedge resection, and possible lobectomy were discussed in detail.  He is agreeable to proceed and is tentatively scheduled for May 26.  In regards to his history of cardiac arrest following a nasal procedure 15 years ago, given that he has had an extensive cardiac work-up in recent stress test which was negative, I am confident that we will be able to proceed without any cardiac complications.  He will require pulmonary function testing prior to surgery.     I  spent 40 minutes with  the patient  face to face and greater then 50% of the time was spent in counseling and coordination of care.    Lajuana Matte 08/11/2019 4:23 PM

## 2019-08-12 ENCOUNTER — Ambulatory Visit (HOSPITAL_COMMUNITY)
Admission: RE | Admit: 2019-08-12 | Discharge: 2019-08-12 | Disposition: A | Discharge status: Home / Self Care | Payer: Medicare Other | Source: Ambulatory Visit | Attending: Thoracic Surgery (Cardiothoracic Vascular Surgery) | Admitting: Thoracic Surgery (Cardiothoracic Vascular Surgery)

## 2019-08-12 ENCOUNTER — Inpatient Hospital Stay (HOSPITAL_COMMUNITY): Admission: RE | Admit: 2019-08-12 | Payer: Medicare Other | Source: Ambulatory Visit

## 2019-08-12 DIAGNOSIS — R911 Solitary pulmonary nodule: Secondary | ICD-10-CM | POA: Insufficient documentation

## 2019-08-12 DIAGNOSIS — R942 Abnormal results of pulmonary function studies: Secondary | ICD-10-CM | POA: Insufficient documentation

## 2019-08-12 LAB — PULMONARY FUNCTION TEST
DL/VA % pred: 112 %
DL/VA: 4.57 ml/min/mmHg/L
DLCO unc % pred: 84 %
DLCO unc: 21.81 ml/min/mmHg
FEF 25-75 Post: 2.87 L/sec
FEF 25-75 Pre: 2.21 L/sec
FEF2575-%Change-Post: 30 %
FEF2575-%Pred-Post: 115 %
FEF2575-%Pred-Pre: 89 %
FEV1-%Change-Post: 8 %
FEV1-%Pred-Post: 82 %
FEV1-%Pred-Pre: 75 %
FEV1-Post: 2.67 L
FEV1-Pre: 2.47 L
FEV1FVC-%Change-Post: 2 %
FEV1FVC-%Pred-Pre: 107 %
FEV6-%Change-Post: 4 %
FEV6-%Pred-Post: 77 %
FEV6-%Pred-Pre: 74 %
FEV6-Post: 3.24 L
FEV6-Pre: 3.1 L
FEV6FVC-%Change-Post: -1 %
FEV6FVC-%Pred-Post: 103 %
FEV6FVC-%Pred-Pre: 105 %
FVC-%Change-Post: 5 %
FVC-%Pred-Post: 74 %
FVC-%Pred-Pre: 70 %
FVC-Post: 3.31 L
FVC-Pre: 3.13 L
Post FEV1/FVC ratio: 81 %
Post FEV6/FVC ratio: 98 %
Pre FEV1/FVC ratio: 79 %
Pre FEV6/FVC Ratio: 99 %
RV % pred: 96 %
RV: 2.37 L
TLC % pred: 80 %
TLC: 5.67 L

## 2019-08-12 LAB — SARS CORONAVIRUS 2 (TAT 6-24 HRS): SARS Coronavirus 2: NEGATIVE

## 2019-08-12 MED ORDER — ALBUTEROL SULFATE (2.5 MG/3ML) 0.083% IN NEBU
2.5000 mg | INHALATION_SOLUTION | Freq: Once | RESPIRATORY_TRACT | Status: AC
  Administered 2019-08-12: 2.5 mg via RESPIRATORY_TRACT

## 2019-08-12 NOTE — Progress Notes (Signed)
[place], [place] [address] Phone: [phone] Fax: [phone]      Your procedure is scheduled on Tuesday, Aug 16, 2019 at 9:30 AM.  Report to [facility] Main Entrance "A" at 7:30 A.M., and check in at the Admitting office.  Call this number if you have problems the morning of surgery:  [phone]  Call [phone] if you have any questions prior to your surgery date Monday-Friday 8am-4pm    Remember:  Do not eat or drink after midnight the night before your surgery    Take these medicines the morning of surgery with A SIP OF WATER:  ARNUITY ELLIPTA  labetalol (NORMODYNE) acetaminophen (TYLENOL) - if needed albuterol (VENTOLIN HFA) - if needed  Please bring all inhalers with you the day of surgery.   Follow your surgeon's instructions on when to stop Aspirin.  If no instructions were given by your surgeon then you will need to call the office to get those instructions.    As of today, STOP taking any Aspirin (unless otherwise instructed by your surgeon) and Aspirin containing products, Aleve, Naproxen, Ibuprofen, Motrin, Advil, Goody's, BC's, all herbal medications, fish oil, and all vitamins (Zinc, Vitamin D3, Vitamin C, etc).    WHAT DO I DO ABOUT MY DIABETES MEDICATION?   Marland Kitchen Do not take oral diabetes medicines (metFORMIN (GLUCOPHAGE)) the morning of surgery.   HOW TO MANAGE YOUR DIABETES BEFORE AND AFTER SURGERY  Why is it important to control my blood sugar before and after surgery? . Improving blood sugar levels before and after surgery helps healing and can limit problems. . A way of improving blood sugar control is eating a healthy diet by: o  Eating less sugar and carbohydrates o  Increasing activity/exercise o  Talking with your doctor about reaching your blood sugar goals . High blood sugars (greater than 180 mg/dL) can raise your risk of infections and slow your  recovery, so you will need to focus on controlling your diabetes during the weeks before surgery. . Make sure that the doctor who takes care of your diabetes knows about your planned surgery including the date and location.  How do I manage my blood sugar before surgery? . Check your blood sugar at least 4 times a day, starting 2 days before surgery, to make sure that the level is not too high or low. . Check your blood sugar the morning of your surgery when you wake up and every 2 hours until you get to the Short Stay unit. o If your blood sugar is less than 70 mg/dL, you will need to treat for low blood sugar: - Do not take insulin. - Treat a low blood sugar (less than 70 mg/dL) with  cup of clear juice (cranberry or apple), 4 glucose tablets, OR glucose gel. - Recheck blood sugar in 15 minutes after treatment (to make sure it is greater than 70 mg/dL). If your blood sugar is not greater than 70 mg/dL on recheck, call [phone] for further instructions. . Report your blood sugar to the short stay nurse when you get to Short Stay.  . If you are admitted to the hospital after surgery: o Your blood sugar will be checked by the staff and you will probably be given insulin after surgery (instead of oral diabetes medicines) to make sure you have good blood sugar levels. o The goal for blood sugar control after surgery is 80-180 mg/dL.  Do not wear jewelry.            Do not wear lotions, powders, colognes, or deodorant.            Men may shave face and neck.            Do not bring valuables to the hospital.            [facility] is not responsible for any belongings or valuables.  Do NOT Smoke (Tobacco/Vapping) or drink Alcohol 24 hours prior to your procedure If you use a CPAP at night, you may bring all equipment for your overnight stay.   Contacts, glasses, dentures or bridgework may not be worn into surgery.      For patients admitted to the hospital, discharge  time will be determined by your treatment team.   Patients discharged the day of surgery will not be allowed to drive home, and someone needs to stay with them for 24 hours.    Special instructions:   [facility]- Preparing For Surgery  Before surgery, you can play an important role. Because skin is not sterile, your skin needs to be as free of germs as possible. You can reduce the number of germs on your skin by washing with CHG (chlorahexidine gluconate) Soap before surgery.  CHG is an antiseptic cleaner which kills germs and bonds with the skin to continue killing germs even after washing.    Oral Hygiene is also important to reduce your risk of infection.  Remember - BRUSH YOUR TEETH THE MORNING OF SURGERY WITH YOUR REGULAR TOOTHPASTE  Please do not use if you have an allergy to CHG or antibacterial soaps. If your skin becomes reddened/irritated stop using the CHG.  Do not shave (including legs and underarms) for at least 48 hours prior to first CHG shower. It is OK to shave your face.  Please follow these instructions carefully.   1. Shower the NIGHT BEFORE SURGERY and the MORNING OF SURGERY with CHG Soap.   2. If you chose to wash your hair, wash your hair first as usual with your normal shampoo.  3. After you shampoo, rinse your hair and body thoroughly to remove the shampoo.  4. Use CHG as you would any other liquid soap. You can apply CHG directly to the skin and wash gently with a scrungie or a clean washcloth.   5. Apply the CHG Soap to your body ONLY FROM THE NECK DOWN.  Do not use on open wounds or open sores. Avoid contact with your eyes, ears, mouth and genitals (private parts). Wash Face and genitals (private parts)  with your normal soap.   6. Wash thoroughly, paying special attention to the area where your surgery will be performed.  7. Thoroughly rinse your body with warm water from the neck down.  8. DO NOT shower/wash with your normal soap after using and rinsing  off the CHG Soap.  9. Pat yourself dry with a CLEAN TOWEL.  10. Wear CLEAN PAJAMAS to bed the night before surgery, wear comfortable clothes the morning of surgery  11. Place CLEAN SHEETS on your bed the night of your first shower and DO NOT SLEEP WITH PETS.   Day of Surgery:   Do not apply any deodorants/lotions.  Please wear clean clothes to the hospital/surgery center.   Remember to brush your teeth WITH YOUR REGULAR TOOTHPASTE.   Please read over the following fact sheets that you were given.

## 2019-08-15 ENCOUNTER — Other Ambulatory Visit: Payer: Self-pay

## 2019-08-15 ENCOUNTER — Encounter (HOSPITAL_COMMUNITY): Payer: Self-pay

## 2019-08-15 ENCOUNTER — Encounter (HOSPITAL_COMMUNITY)
Admission: RE | Admit: 2019-08-15 | Discharge: 2019-08-15 | Disposition: A | Discharge status: Home / Self Care | Payer: Medicare Other | Source: Ambulatory Visit | Attending: Thoracic Surgery (Cardiothoracic Vascular Surgery) | Admitting: Thoracic Surgery (Cardiothoracic Vascular Surgery)

## 2019-08-15 ENCOUNTER — Ambulatory Visit (HOSPITAL_COMMUNITY)
Admission: RE | Admit: 2019-08-15 | Discharge: 2019-08-15 | Disposition: A | Discharge status: Home / Self Care | Payer: Medicare Other | Source: Ambulatory Visit | Attending: Thoracic Surgery (Cardiothoracic Vascular Surgery) | Admitting: Thoracic Surgery (Cardiothoracic Vascular Surgery)

## 2019-08-15 DIAGNOSIS — E119 Type 2 diabetes mellitus without complications: Secondary | ICD-10-CM | POA: Diagnosis not present

## 2019-08-15 DIAGNOSIS — C3432 Malignant neoplasm of lower lobe, left bronchus or lung: Secondary | ICD-10-CM | POA: Diagnosis not present

## 2019-08-15 DIAGNOSIS — I10 Essential (primary) hypertension: Secondary | ICD-10-CM | POA: Diagnosis not present

## 2019-08-15 DIAGNOSIS — Z79899 Other long term (current) drug therapy: Secondary | ICD-10-CM | POA: Diagnosis not present

## 2019-08-15 DIAGNOSIS — R911 Solitary pulmonary nodule: Secondary | ICD-10-CM

## 2019-08-15 DIAGNOSIS — Z888 Allergy status to other drugs, medicaments and biological substances status: Secondary | ICD-10-CM | POA: Diagnosis not present

## 2019-08-15 DIAGNOSIS — G4730 Sleep apnea, unspecified: Secondary | ICD-10-CM | POA: Diagnosis not present

## 2019-08-15 DIAGNOSIS — Z01818 Encounter for other preprocedural examination: Secondary | ICD-10-CM | POA: Diagnosis not present

## 2019-08-15 DIAGNOSIS — E785 Hyperlipidemia, unspecified: Secondary | ICD-10-CM | POA: Diagnosis not present

## 2019-08-15 DIAGNOSIS — Z7982 Long term (current) use of aspirin: Secondary | ICD-10-CM | POA: Diagnosis not present

## 2019-08-15 DIAGNOSIS — Z8249 Family history of ischemic heart disease and other diseases of the circulatory system: Secondary | ICD-10-CM | POA: Diagnosis not present

## 2019-08-15 DIAGNOSIS — Z880 Allergy status to penicillin: Secondary | ICD-10-CM | POA: Diagnosis not present

## 2019-08-15 DIAGNOSIS — Z7984 Long term (current) use of oral hypoglycemic drugs: Secondary | ICD-10-CM | POA: Diagnosis not present

## 2019-08-15 LAB — URINALYSIS, ROUTINE W REFLEX MICROSCOPIC
Bilirubin Urine: NEGATIVE
Glucose, UA: NEGATIVE mg/dL
Hgb urine dipstick: NEGATIVE
Ketones, ur: NEGATIVE mg/dL
Leukocytes,Ua: NEGATIVE
Nitrite: NEGATIVE
Protein, ur: NEGATIVE mg/dL
Specific Gravity, Urine: 1.018 (ref 1.005–1.030)
pH: 5 (ref 5.0–8.0)

## 2019-08-15 LAB — BLOOD GAS, ARTERIAL
Acid-base deficit: 1.6 mmol/L (ref 0.0–2.0)
Bicarbonate: 22.6 mmol/L (ref 20.0–28.0)
Drawn by: 421801
FIO2: 21
O2 Saturation: 97.4 %
Patient temperature: 37
pCO2 arterial: 37.6 mmHg (ref 32.0–48.0)
pH, Arterial: 7.396 (ref 7.350–7.450)
pO2, Arterial: 95.5 mmHg (ref 83.0–108.0)

## 2019-08-15 LAB — CBC
HCT: 40.4 % (ref 39.0–52.0)
Hemoglobin: 13.6 g/dL (ref 13.0–17.0)
MCH: 30.3 pg (ref 26.0–34.0)
MCHC: 33.7 g/dL (ref 30.0–36.0)
MCV: 90 fL (ref 80.0–100.0)
Platelets: 160 10*3/uL (ref 150–400)
RBC: 4.49 MIL/uL (ref 4.22–5.81)
RDW: 13.2 % (ref 11.5–15.5)
WBC: 6.4 10*3/uL (ref 4.0–10.5)
nRBC: 0 % (ref 0.0–0.2)

## 2019-08-15 LAB — SURGICAL PCR SCREEN
MRSA, PCR: NEGATIVE
Staphylococcus aureus: NEGATIVE

## 2019-08-15 LAB — COMPREHENSIVE METABOLIC PANEL
ALT: 20 U/L (ref 0–44)
AST: 20 U/L (ref 15–41)
Albumin: 4.3 g/dL (ref 3.5–5.0)
Alkaline Phosphatase: 64 U/L (ref 38–126)
Anion gap: 11 (ref 5–15)
BUN: 16 mg/dL (ref 8–23)
CO2: 20 mmol/L — ABNORMAL LOW (ref 22–32)
Calcium: 9.9 mg/dL (ref 8.9–10.3)
Chloride: 106 mmol/L (ref 98–111)
Creatinine, Ser: 1.2 mg/dL (ref 0.61–1.24)
GFR calc Af Amer: >60 mL/min (ref >60)
GFR calc non Af Amer: >60 mL/min (ref >60)
Glucose, Bld: 153 mg/dL — ABNORMAL HIGH (ref 70–99)
Potassium: 4.5 mmol/L (ref 3.5–5.1)
Sodium: 137 mmol/L (ref 135–145)
Total Bilirubin: 0.7 mg/dL (ref 0.3–1.2)
Total Protein: 6.6 g/dL (ref 6.5–8.1)

## 2019-08-15 LAB — PROTIME-INR
INR: 1 (ref 0.8–1.2)
Prothrombin Time: 12.9 seconds (ref 11.4–15.2)

## 2019-08-15 LAB — TYPE AND SCREEN
ABO/RH(D): O POS
Antibody Screen: NEGATIVE

## 2019-08-15 LAB — APTT: aPTT: 28 seconds (ref 24–36)

## 2019-08-15 LAB — GLUCOSE, CAPILLARY: Glucose-Capillary: 145 mg/dL — ABNORMAL HIGH (ref 70–99)

## 2019-08-15 LAB — ABO/RH: ABO/RH(D): O POS

## 2019-08-15 MED ORDER — VANCOMYCIN HCL 1500 MG/300ML IV SOLN
1500.0000 mg | INTRAVENOUS | Status: AC
  Administered 2019-08-16: 1500 mg via INTRAVENOUS
  Filled 2019-08-15 (×2): qty 300

## 2019-08-15 NOTE — Anesthesia Preprocedure Evaluation (Addendum)
Anesthesia Evaluation  Patient identified by MRN, date of birth, ID band Patient awake    Reviewed: Allergy & Precautions, NPO status , Patient's Chart, lab work & pertinent test results  Airway Mallampati: II   Neck ROM: Full    Dental  (+) Teeth Intact, Dental Advisory Given   Pulmonary    breath sounds clear to auscultation       Cardiovascular hypertension,  Rhythm:Regular Rate:Normal     Neuro/Psych    GI/Hepatic   Endo/Other  diabetes  Renal/GU      Musculoskeletal   Abdominal   Peds  Hematology   Anesthesia Other Findings   Reproductive/Obstetrics                            Anesthesia Physical Anesthesia Plan  ASA: III  Anesthesia Plan: General   Post-op Pain Management:    Induction: Intravenous  PONV Risk Score and Plan: Ondansetron and Dexamethasone  Airway Management Planned: Double Lumen EBT  Additional Equipment:   Intra-op Plan:   Post-operative Plan:   Informed Consent: I have reviewed the patients History and Physical, chart, labs and discussed the procedure including the risks, benefits and alternatives for the proposed anesthesia with the patient or authorized representative who has indicated his/her understanding and acceptance.     Dental advisory given  Plan Discussed with: CRNA and Anesthesiologist  Anesthesia Plan Comments: (PAT note by Karoline Caldwell, PA-C: Pt has a history of brief asystole that occured after nasal polypectomy in 2008. Per consult note from Dr. Stanford Breed 02/09/2007, " This morning he took his morning medications whichinclude labetalol 300 mg.  He tolerated the surgery well, but during recovery from anesthesia while still in the operating room, he had a heart rate then dropped into the 30s and was treated with ephedrine and then went asystolic.  The asystole lasted 6 seconds by report.  CPR was begun, and sinus bradycardia was  reestablished with atropine 0.4 mg. He had another episode of asystole, according to reports, and received 0.2 mg of atropine.  His heart rate increased to the 80s, and his blood pressure responded as well.  His systolic blood pressure had dropped to the 80s during this.  There are no strips available for review. Postprocedure once he recovered from anesthesia, Mr. Buth is alert and oriented." Cardiac workup was ultimately reassuring and it was felt to have been a vasovagal episode. Patient has had other surgeries without complication.   More recently pt was seen by Dr. Stanford Breed 03/16/19 and coronary CT was ordered for risk stratification which showed calcium score to be 1368. Subsequent nuclear stress test showed EF 58% with normal perfusion. Incidentally we was also noted to have 13 mm LLL nodule on CT and was referred to pulmonology and then cardiothoracic surgery. Per notes, the patient has no pulmonary complaints and has good activity tolerance.   Proep labs reviewed, unremarkable. DMII, last A1c 7.1 on 05/17/19.  EKG 08/15/19: NSR. Rate 65.  CHEST - 2 VIEW 08/15/19:  COMPARISON:  08/03/2019  FINDINGS: Cardiac shadow is within normal limits. The lungs are clear bilaterally. Known left lower lobe pulmonary nodule is not well appreciated on this exam. No bony abnormality is seen.  IMPRESSION: No active cardiopulmonary disease.  PFT 08/12/19: FVC-%Pred-Pre Latest Units: % 70 FEV1-%Pred-Pre Latest Units: % 75 TLC % pred Latest Units: % 80 DLCO unc % pred Latest Units: % 84  FEV1FVC-%Pred-Pre Latest Units: % 107   Nuclear  stress 04/21/19: The left ventricular ejection fraction is normal (55-65%). Nuclear stress EF: 58%. There was no ST segment deviation noted during stress. No T wave inversion was noted during stress. The study is normal. This is a low risk study.   Low risk stress nuclear study with normal perfusion and normal left ventricular regional and global systolic  function. )       Anesthesia Quick Evaluation

## 2019-08-15 NOTE — Progress Notes (Signed)
PCP - Dr. Celso Amy Cardiologist - Dr. Kirk Ruths  Chest x-ray - 08/15/19 EKG - 08/15/19 Stress Test - January 2021 ECHO - Yes 2010 Cardiac Cath - No  Sleep Study - Yes CPAP - Yes  Fasting Blood Sugar - 145  Checks Blood Sugar __0___ times a day  Aspirin Instructions:81mg  daily last dose 24th  COVID TEST- Thursday 20th  Anesthesia review:  Yes HTN and per patient coded 2 x after nasal polyp removal  Patient denies shortness of breath, fever, cough and chest pain at PAT appointment  All instructions explained to the patient, with a verbal understanding of the material. Patient agrees to go over the instructions while at home for a better understanding. Patient also instructed to self quarantine after being tested for COVID-19. The opportunity to ask questions was provided.

## 2019-08-15 NOTE — Progress Notes (Signed)
Anesthesia Chart Review:  Pt has a history of brief asystole that occured after nasal polypectomy in 2008. Per consult note from Dr. Stanford Breed 02/09/2007, " This morning he took his morning medications whichinclude labetalol 300 mg.  He tolerated the surgery well, but during recovery from anesthesia while still in the operating room, he had a heart rate then dropped into the 30s and was treated with ephedrine and then went asystolic.  The asystole lasted 6 seconds by report.  CPR was begun, and sinus bradycardia was reestablished with atropine 0.4 mg. He had another episode of asystole, according to reports, and received 0.2 mg of atropine.  His heart rate increased to the 80s, and his blood pressure responded as well.  His systolic blood pressure had dropped to the 80s during this.  There are no strips available for review. Postprocedure once he recovered from anesthesia, Mr. Nicotra is alert and oriented." Cardiac workup was ultimately reassuring and it was felt to have been a vasovagal episode. Patient has had other surgeries without complication.   More recently pt was seen by Dr. Stanford Breed 03/16/19 and coronary CT was ordered for risk stratification which showed calcium score to be 1368. Subsequent nuclear stress test showed EF 58% with normal perfusion. Incidentally we was also noted to have 13 mm LLL nodule on CT and was referred to pulmonology and then cardiothoracic surgery. Per notes, the patient has no pulmonary complaints and has good activity tolerance.   Proep labs reviewed, unremarkable. DMII, last A1c 7.1 on 05/17/19.  EKG 08/15/19: NSR. Rate 65.  CHEST - 2 VIEW 08/15/19:  COMPARISON:  08/03/2019  FINDINGS: Cardiac shadow is within normal limits. The lungs are clear bilaterally. Known left lower lobe pulmonary nodule is not well appreciated on this exam. No bony abnormality is seen.  IMPRESSION: No active cardiopulmonary disease.  PFT 08/12/19: FVC-%Pred-Pre Latest Units: % 70   FEV1-%Pred-Pre Latest Units: % 75  TLC % pred Latest Units: % 80  DLCO unc % pred Latest Units: % 84   FEV1FVC-%Pred-Pre Latest Units: % 107    Nuclear stress 04/21/19:  The left ventricular ejection fraction is normal (55-65%).  Nuclear stress EF: 58%.  There was no ST segment deviation noted during stress.  No T wave inversion was noted during stress.  The study is normal.  This is a low risk study.   Low risk stress nuclear study with normal perfusion and normal left ventricular regional and global systolic function.  Wynonia Musty [facility] Short Stay Center/Anesthesiology Phone [phone] 08/15/2019 3:11 PM

## 2019-08-16 ENCOUNTER — Inpatient Hospital Stay (HOSPITAL_COMMUNITY): Payer: Medicare Other | Admitting: Physician Assistant

## 2019-08-16 ENCOUNTER — Encounter (HOSPITAL_COMMUNITY): Payer: Self-pay | Admitting: Thoracic Surgery (Cardiothoracic Vascular Surgery)

## 2019-08-16 ENCOUNTER — Encounter (HOSPITAL_COMMUNITY)
Admission: RE | Disposition: A | Discharge status: Home / Self Care | Payer: Self-pay | Source: Home / Self Care | Attending: Thoracic Surgery (Cardiothoracic Vascular Surgery)

## 2019-08-16 ENCOUNTER — Other Ambulatory Visit: Payer: Self-pay

## 2019-08-16 ENCOUNTER — Inpatient Hospital Stay (HOSPITAL_COMMUNITY): Payer: Medicare Other

## 2019-08-16 ENCOUNTER — Inpatient Hospital Stay (HOSPITAL_COMMUNITY)
Admission: RE | Admit: 2019-08-16 | Discharge: 2019-08-19 | DRG: 165 | Disposition: A | Discharge status: Home / Self Care | Payer: Medicare Other | Attending: Thoracic Surgery (Cardiothoracic Vascular Surgery) | Admitting: Thoracic Surgery (Cardiothoracic Vascular Surgery)

## 2019-08-16 DIAGNOSIS — Z7984 Long term (current) use of oral hypoglycemic drugs: Secondary | ICD-10-CM

## 2019-08-16 DIAGNOSIS — Z880 Allergy status to penicillin: Secondary | ICD-10-CM

## 2019-08-16 DIAGNOSIS — C3412 Malignant neoplasm of upper lobe, left bronchus or lung: Secondary | ICD-10-CM | POA: Diagnosis not present

## 2019-08-16 DIAGNOSIS — Z79899 Other long term (current) drug therapy: Secondary | ICD-10-CM

## 2019-08-16 DIAGNOSIS — Z7982 Long term (current) use of aspirin: Secondary | ICD-10-CM | POA: Diagnosis not present

## 2019-08-16 DIAGNOSIS — Z888 Allergy status to other drugs, medicaments and biological substances status: Secondary | ICD-10-CM | POA: Diagnosis not present

## 2019-08-16 DIAGNOSIS — Z902 Acquired absence of lung [part of]: Secondary | ICD-10-CM

## 2019-08-16 DIAGNOSIS — E785 Hyperlipidemia, unspecified: Secondary | ICD-10-CM | POA: Diagnosis not present

## 2019-08-16 DIAGNOSIS — C3432 Malignant neoplasm of lower lobe, left bronchus or lung: Principal | ICD-10-CM | POA: Diagnosis present

## 2019-08-16 DIAGNOSIS — R911 Solitary pulmonary nodule: Secondary | ICD-10-CM

## 2019-08-16 DIAGNOSIS — J939 Pneumothorax, unspecified: Secondary | ICD-10-CM

## 2019-08-16 DIAGNOSIS — E119 Type 2 diabetes mellitus without complications: Secondary | ICD-10-CM | POA: Diagnosis not present

## 2019-08-16 DIAGNOSIS — Z8249 Family history of ischemic heart disease and other diseases of the circulatory system: Secondary | ICD-10-CM | POA: Diagnosis not present

## 2019-08-16 DIAGNOSIS — Z4682 Encounter for fitting and adjustment of non-vascular catheter: Secondary | ICD-10-CM | POA: Diagnosis not present

## 2019-08-16 DIAGNOSIS — I10 Essential (primary) hypertension: Secondary | ICD-10-CM | POA: Diagnosis present

## 2019-08-16 DIAGNOSIS — G4733 Obstructive sleep apnea (adult) (pediatric): Secondary | ICD-10-CM | POA: Diagnosis not present

## 2019-08-16 DIAGNOSIS — G4730 Sleep apnea, unspecified: Secondary | ICD-10-CM | POA: Diagnosis present

## 2019-08-16 DIAGNOSIS — J9811 Atelectasis: Secondary | ICD-10-CM | POA: Diagnosis not present

## 2019-08-16 DIAGNOSIS — Z09 Encounter for follow-up examination after completed treatment for conditions other than malignant neoplasm: Secondary | ICD-10-CM

## 2019-08-16 HISTORY — PX: NODE DISSECTION: SHX5269

## 2019-08-16 HISTORY — PX: INTERCOSTAL NERVE BLOCK: SHX5021

## 2019-08-16 HISTORY — PX: VIDEO BRONCHOSCOPY: SHX5072

## 2019-08-16 LAB — GLUCOSE, CAPILLARY
Glucose-Capillary: 133 mg/dL — ABNORMAL HIGH (ref 70–99)
Glucose-Capillary: 163 mg/dL — ABNORMAL HIGH (ref 70–99)
Glucose-Capillary: 176 mg/dL — ABNORMAL HIGH (ref 70–99)
Glucose-Capillary: 172 mg/dL — ABNORMAL HIGH (ref 70–99)

## 2019-08-16 SURGERY — BRONCHOSCOPY, VIDEO-ASSISTED
Anesthesia: General | Site: Chest

## 2019-08-16 MED ORDER — ONDANSETRON HCL 4 MG/2ML IJ SOLN
INTRAMUSCULAR | Status: AC
  Filled 2019-08-16: qty 2

## 2019-08-16 MED ORDER — ACETAMINOPHEN 500 MG PO TABS
500.0000 mg | ORAL_TABLET | Freq: Four times a day (QID) | ORAL | Status: DC | PRN
  Administered 2019-08-18: 1000 mg via ORAL

## 2019-08-16 MED ORDER — SODIUM CHLORIDE 0.9 % IR SOLN
Status: DC | PRN
  Administered 2019-08-16: 1000 mL

## 2019-08-16 MED ORDER — LIDOCAINE 2% (20 MG/ML) 5 ML SYRINGE
INTRAMUSCULAR | Status: DC | PRN
  Administered 2019-08-16: 80 mg via INTRAVENOUS

## 2019-08-16 MED ORDER — LIDOCAINE 2% (20 MG/ML) 5 ML SYRINGE
INTRAMUSCULAR | Status: AC
  Filled 2019-08-16: qty 5

## 2019-08-16 MED ORDER — KETOROLAC TROMETHAMINE 30 MG/ML IJ SOLN
INTRAMUSCULAR | Status: AC
  Filled 2019-08-16: qty 1

## 2019-08-16 MED ORDER — PROPOFOL 10 MG/ML IV BOLUS
INTRAVENOUS | Status: DC | PRN
  Administered 2019-08-16: 100 mg via INTRAVENOUS
  Administered 2019-08-16 (×2): 50 mg via INTRAVENOUS

## 2019-08-16 MED ORDER — PHENYLEPHRINE HCL-NACL 10-0.9 MG/250ML-% IV SOLN
INTRAVENOUS | Status: DC | PRN
  Administered 2019-08-16: 30 ug/min via INTRAVENOUS

## 2019-08-16 MED ORDER — EPHEDRINE 5 MG/ML INJ
INTRAVENOUS | Status: AC
  Filled 2019-08-16: qty 20

## 2019-08-16 MED ORDER — LACTATED RINGERS IV SOLN: INTRAVENOUS | Status: DC | PRN

## 2019-08-16 MED ORDER — FENTANYL CITRATE (PF) 250 MCG/5ML IJ SOLN
INTRAMUSCULAR | Status: AC
  Filled 2019-08-16: qty 5

## 2019-08-16 MED ORDER — METFORMIN HCL 500 MG PO TABS
1000.0000 mg | ORAL_TABLET | Freq: Two times a day (BID) | ORAL | Status: DC
  Administered 2019-08-17 – 2019-08-19 (×5): 1000 mg via ORAL
  Filled 2019-08-16 (×5): qty 2

## 2019-08-16 MED ORDER — SODIUM CHLORIDE FLUSH 0.9 % IV SOLN
INTRAVENOUS | Status: DC | PRN
  Administered 2019-08-16: 100 mL

## 2019-08-16 MED ORDER — HEMOSTATIC AGENTS (NO CHARGE) OPTIME
TOPICAL | Status: DC | PRN
  Administered 2019-08-16: 2 via TOPICAL

## 2019-08-16 MED ORDER — LABETALOL HCL 200 MG PO TABS
200.0000 mg | ORAL_TABLET | Freq: Two times a day (BID) | ORAL | Status: DC
  Administered 2019-08-16 – 2019-08-19 (×6): 200 mg via ORAL
  Filled 2019-08-16 (×6): qty 1

## 2019-08-16 MED ORDER — SENNOSIDES-DOCUSATE SODIUM 8.6-50 MG PO TABS
1.0000 | ORAL_TABLET | Freq: Every day | ORAL | Status: DC
  Administered 2019-08-16 – 2019-08-18 (×3): 1 via ORAL
  Filled 2019-08-16 (×3): qty 1

## 2019-08-16 MED ORDER — MIDAZOLAM HCL 2 MG/2ML IJ SOLN
INTRAMUSCULAR | Status: AC
  Filled 2019-08-16: qty 2

## 2019-08-16 MED ORDER — ONDANSETRON HCL 4 MG/2ML IJ SOLN: 4.0000 mg | Freq: Four times a day (QID) | INTRAMUSCULAR | Status: DC | PRN

## 2019-08-16 MED ORDER — IRBESARTAN 150 MG PO TABS
75.0000 mg | ORAL_TABLET | Freq: Every day | ORAL | Status: DC
  Administered 2019-08-17 – 2019-08-19 (×3): 75 mg via ORAL
  Filled 2019-08-16 (×3): qty 1

## 2019-08-16 MED ORDER — 0.9 % SODIUM CHLORIDE (POUR BTL) OPTIME
TOPICAL | Status: DC | PRN
  Administered 2019-08-16: 3000 mL

## 2019-08-16 MED ORDER — SPIRONOLACTONE 25 MG PO TABS
25.0000 mg | ORAL_TABLET | Freq: Every day | ORAL | Status: DC
  Administered 2019-08-17 – 2019-08-19 (×3): 25 mg via ORAL
  Filled 2019-08-16 (×3): qty 1

## 2019-08-16 MED ORDER — LACTATED RINGERS IV SOLN: INTRAVENOUS | Status: DC

## 2019-08-16 MED ORDER — KETOROLAC TROMETHAMINE 30 MG/ML IJ SOLN
30.0000 mg | Freq: Four times a day (QID) | INTRAMUSCULAR | Status: DC
  Administered 2019-08-16 – 2019-08-17 (×4): 30 mg via INTRAVENOUS
  Filled 2019-08-16 (×4): qty 1

## 2019-08-16 MED ORDER — ORAL CARE MOUTH RINSE: 15.0000 mL | Freq: Once | OROMUCOSAL | Status: AC

## 2019-08-16 MED ORDER — TRAMADOL HCL 50 MG PO TABS
50.0000 mg | ORAL_TABLET | Freq: Four times a day (QID) | ORAL | Status: DC | PRN
  Administered 2019-08-17: 50 mg via ORAL
  Filled 2019-08-16: qty 1

## 2019-08-16 MED ORDER — BUPIVACAINE LIPOSOME 1.3 % IJ SUSP
20.0000 mL | Freq: Once | INTRAMUSCULAR | Status: DC
  Filled 2019-08-16: qty 20

## 2019-08-16 MED ORDER — ROCURONIUM BROMIDE 10 MG/ML (PF) SYRINGE
PREFILLED_SYRINGE | INTRAVENOUS | Status: AC
  Filled 2019-08-16: qty 10

## 2019-08-16 MED ORDER — SODIUM CHLORIDE 0.9 % IV SOLN: INTRAVENOUS | Status: DC | PRN

## 2019-08-16 MED ORDER — DEXAMETHASONE SODIUM PHOSPHATE 10 MG/ML IJ SOLN
INTRAMUSCULAR | Status: AC
  Filled 2019-08-16: qty 1

## 2019-08-16 MED ORDER — ENOXAPARIN SODIUM 40 MG/0.4ML ~~LOC~~ SOLN
40.0000 mg | Freq: Every day | SUBCUTANEOUS | Status: DC
  Administered 2019-08-17 – 2019-08-19 (×3): 40 mg via SUBCUTANEOUS
  Filled 2019-08-16 (×3): qty 0.4

## 2019-08-16 MED ORDER — VANCOMYCIN HCL IN DEXTROSE 1-5 GM/200ML-% IV SOLN
1000.0000 mg | Freq: Two times a day (BID) | INTRAVENOUS | Status: AC
  Administered 2019-08-16: 1000 mg via INTRAVENOUS
  Filled 2019-08-16: qty 200

## 2019-08-16 MED ORDER — FENTANYL CITRATE (PF) 250 MCG/5ML IJ SOLN
INTRAMUSCULAR | Status: DC | PRN
  Administered 2019-08-16: 100 ug via INTRAVENOUS
  Administered 2019-08-16: 25 ug via INTRAVENOUS
  Administered 2019-08-16: 50 ug via INTRAVENOUS
  Administered 2019-08-16: 25 ug via INTRAVENOUS
  Administered 2019-08-16: 50 ug via INTRAVENOUS

## 2019-08-16 MED ORDER — EPHEDRINE SULFATE 50 MG/ML IJ SOLN
INTRAMUSCULAR | Status: DC | PRN
  Administered 2019-08-16 (×2): 10 mg via INTRAVENOUS

## 2019-08-16 MED ORDER — BUDESONIDE 0.5 MG/2ML IN SUSP
0.5000 mg | Freq: Two times a day (BID) | RESPIRATORY_TRACT | Status: DC
  Administered 2019-08-16 – 2019-08-19 (×6): 0.5 mg via RESPIRATORY_TRACT
  Filled 2019-08-16 (×6): qty 2

## 2019-08-16 MED ORDER — EZETIMIBE-SIMVASTATIN 10-20 MG PO TABS
1.0000 | ORAL_TABLET | Freq: Every day | ORAL | Status: DC
  Filled 2019-08-16: qty 1

## 2019-08-16 MED ORDER — SUGAMMADEX SODIUM 200 MG/2ML IV SOLN
INTRAVENOUS | Status: DC | PRN
  Administered 2019-08-16: 200 mg via INTRAVENOUS

## 2019-08-16 MED ORDER — ONDANSETRON HCL 4 MG/2ML IJ SOLN
INTRAMUSCULAR | Status: DC | PRN
  Administered 2019-08-16: 4 mg via INTRAVENOUS

## 2019-08-16 MED ORDER — PROPOFOL 10 MG/ML IV BOLUS
INTRAVENOUS | Status: AC
  Filled 2019-08-16: qty 20

## 2019-08-16 MED ORDER — ALBUTEROL SULFATE HFA 108 (90 BASE) MCG/ACT IN AERS: 2.0000 | INHALATION_SPRAY | Freq: Four times a day (QID) | RESPIRATORY_TRACT | Status: DC | PRN

## 2019-08-16 MED ORDER — ACETAMINOPHEN 160 MG/5ML PO SOLN: 1000.0000 mg | Freq: Four times a day (QID) | ORAL | Status: DC

## 2019-08-16 MED ORDER — DEXAMETHASONE SODIUM PHOSPHATE 10 MG/ML IJ SOLN
INTRAMUSCULAR | Status: DC | PRN
  Administered 2019-08-16: 10 mg via INTRAVENOUS

## 2019-08-16 MED ORDER — BISACODYL 5 MG PO TBEC
10.0000 mg | DELAYED_RELEASE_TABLET | Freq: Every day | ORAL | Status: DC
  Administered 2019-08-17 – 2019-08-19 (×2): 10 mg via ORAL
  Filled 2019-08-16 (×3): qty 2

## 2019-08-16 MED ORDER — ACETAMINOPHEN 500 MG PO TABS
1000.0000 mg | ORAL_TABLET | Freq: Four times a day (QID) | ORAL | Status: DC
  Administered 2019-08-16 – 2019-08-19 (×8): 1000 mg via ORAL
  Filled 2019-08-16 (×10): qty 2

## 2019-08-16 MED ORDER — BUPIVACAINE HCL (PF) 0.5 % IJ SOLN
INTRAMUSCULAR | Status: AC
  Filled 2019-08-16: qty 30

## 2019-08-16 MED ORDER — CHLORHEXIDINE GLUCONATE 0.12 % MT SOLN
15.0000 mL | Freq: Once | OROMUCOSAL | Status: AC
  Administered 2019-08-16: 15 mL via OROMUCOSAL
  Filled 2019-08-16: qty 15

## 2019-08-16 MED ORDER — ROCURONIUM BROMIDE 10 MG/ML (PF) SYRINGE
PREFILLED_SYRINGE | INTRAVENOUS | Status: DC | PRN
  Administered 2019-08-16: 60 mg via INTRAVENOUS
  Administered 2019-08-16: 40 mg via INTRAVENOUS
  Administered 2019-08-16: 30 mg via INTRAVENOUS
  Administered 2019-08-16: 10 mg via INTRAVENOUS
  Administered 2019-08-16: 30 mg via INTRAVENOUS

## 2019-08-16 MED ORDER — ASPIRIN EC 81 MG PO TBEC
81.0000 mg | DELAYED_RELEASE_TABLET | Freq: Every day | ORAL | Status: DC
  Administered 2019-08-17 – 2019-08-19 (×3): 81 mg via ORAL
  Filled 2019-08-16 (×4): qty 1

## 2019-08-16 SURGICAL SUPPLY — 135 items
ADH SKN CLS APL DERMABOND .7 (GAUZE/BANDAGES/DRESSINGS) ×3
APL PRP STRL LF DISP 70% ISPRP (MISCELLANEOUS) ×3
BAG SPEC RTRVL C125 8X14 (MISCELLANEOUS) ×3
BAG SPEC RTRVL C1550 15 (MISCELLANEOUS) ×3
BAG SPEC RTRVL LRG 6X4 10 (ENDOMECHANICALS)
BLADE CLIPPER SURG (BLADE) ×5 IMPLANT
BLADE SURG 11 STRL SS (BLADE) ×5 IMPLANT
BNDG COHESIVE 6X5 TAN STRL LF (GAUZE/BANDAGES/DRESSINGS) ×5 IMPLANT
CANISTER SUCT 3000ML PPV (MISCELLANEOUS) ×10 IMPLANT
CANNULA REDUC XI 12-8 STAPL (CANNULA) ×8
CANNULA REDUC XI 12-8MM STAPL (CANNULA) ×2
CANNULA REDUCER 12-8 DVNC XI (CANNULA) ×6 IMPLANT
CATH THORACIC 28FR (CATHETERS) ×2 IMPLANT
CATH THORACIC 28FR RT ANG (CATHETERS) IMPLANT
CATH THORACIC 36FR (CATHETERS) IMPLANT
CATH THORACIC 36FR RT ANG (CATHETERS) IMPLANT
CATH TROCAR 20FR (CATHETERS) IMPLANT
CHLORAPREP W/TINT 26 (MISCELLANEOUS) ×5 IMPLANT
CLIP VESOCCLUDE MED 6/CT (CLIP) IMPLANT
CNTNR URN SCR LID CUP LEK RST (MISCELLANEOUS) ×15 IMPLANT
CONN ST 1/4X3/8  BEN (MISCELLANEOUS)
CONN ST 1/4X3/8 BEN (MISCELLANEOUS) IMPLANT
CONN Y 3/8X3/8X3/8  BEN (MISCELLANEOUS)
CONN Y 3/8X3/8X3/8 BEN (MISCELLANEOUS) IMPLANT
CONT SPEC 4OZ STRL OR WHT (MISCELLANEOUS) ×25
COVER SURGICAL LIGHT HANDLE (MISCELLANEOUS) IMPLANT
DEFOGGER SCOPE WARMER CLEARIFY (MISCELLANEOUS) ×5 IMPLANT
DERMABOND ADVANCED (GAUZE/BANDAGES/DRESSINGS) ×2
DERMABOND ADVANCED .7 DNX12 (GAUZE/BANDAGES/DRESSINGS) ×3 IMPLANT
DISSECTOR BLUNT TIP ENDO 5MM (MISCELLANEOUS) IMPLANT
DRAIN CHANNEL 28F RND 3/8 FF (WOUND CARE) IMPLANT
DRAIN CHANNEL 32F RND 10.7 FF (WOUND CARE) IMPLANT
DRAPE ARM DVNC X/XI (DISPOSABLE) ×12 IMPLANT
DRAPE COLUMN DVNC XI (DISPOSABLE) ×3 IMPLANT
DRAPE CV SPLIT W-CLR ANES SCRN (DRAPES) ×5 IMPLANT
DRAPE DA VINCI XI ARM (DISPOSABLE) ×20
DRAPE DA VINCI XI COLUMN (DISPOSABLE) ×5
DRAPE HALF SHEET 40X57 (DRAPES) ×2 IMPLANT
DRAPE ORTHO SPLIT 77X108 STRL (DRAPES) ×5
DRAPE SURG ORHT 6 SPLT 77X108 (DRAPES) ×3 IMPLANT
DRAPE WARM FLUID 44X44 (DRAPES) ×5 IMPLANT
ELECT BLADE 6.5 EXT (BLADE) IMPLANT
ELECT REM PT RETURN 9FT ADLT (ELECTROSURGICAL) ×5
ELECTRODE REM PT RTRN 9FT ADLT (ELECTROSURGICAL) ×3 IMPLANT
GAUZE KITTNER 4X10 (MISCELLANEOUS) ×3 IMPLANT
GAUZE KITTNER 4X5 RF (MISCELLANEOUS) ×4 IMPLANT
GAUZE KITTNER 4X8 (MISCELLANEOUS) IMPLANT
GAUZE SPONGE 4X4 12PLY STRL (GAUZE/BANDAGES/DRESSINGS) ×5 IMPLANT
GLOVE BIO SURGEON STRL SZ 6.5 (GLOVE) ×1 IMPLANT
GLOVE BIO SURGEON STRL SZ7.5 (GLOVE) ×12 IMPLANT
GLOVE BIO SURGEONS STRL SZ 6.5 (GLOVE) ×1
GLOVE BIOGEL PI IND STRL 6.5 (GLOVE) IMPLANT
GLOVE BIOGEL PI INDICATOR 6.5 (GLOVE) ×2
GLOVE ECLIPSE 7.5 STRL STRAW (GLOVE) ×4 IMPLANT
GOWN STRL REUS W/ TWL LRG LVL3 (GOWN DISPOSABLE) ×6 IMPLANT
GOWN STRL REUS W/ TWL XL LVL3 (GOWN DISPOSABLE) ×9 IMPLANT
GOWN STRL REUS W/TWL 2XL LVL3 (GOWN DISPOSABLE) ×5 IMPLANT
GOWN STRL REUS W/TWL LRG LVL3 (GOWN DISPOSABLE) ×10
GOWN STRL REUS W/TWL XL LVL3 (GOWN DISPOSABLE) ×15
HEMOSTAT SURGICEL 2X14 (HEMOSTASIS) ×13 IMPLANT
IRRIGATION STRYKERFLOW (MISCELLANEOUS) ×3 IMPLANT
IRRIGATOR STRYKERFLOW (MISCELLANEOUS) ×5
KIT BASIN OR (CUSTOM PROCEDURE TRAY) ×5 IMPLANT
KIT SUCTION CATH 14FR (SUCTIONS) IMPLANT
KIT TURNOVER KIT B (KITS) ×5 IMPLANT
LOOP VESSEL SUPERMAXI WHITE (MISCELLANEOUS) IMPLANT
NEEDLE 22X1 1/2 (OR ONLY) (NEEDLE) ×5 IMPLANT
NS IRRIG 1000ML POUR BTL (IV SOLUTION) ×15 IMPLANT
PACK CHEST (CUSTOM PROCEDURE TRAY) ×5 IMPLANT
PAD ARMBOARD 7.5X6 YLW CONV (MISCELLANEOUS) ×25 IMPLANT
POUCH ENDO CATCH II 15MM (MISCELLANEOUS) IMPLANT
POUCH SPECIMEN RETRIEVAL 10MM (ENDOMECHANICALS) IMPLANT
RELOAD STAPLE 45 2.5 WHT DVNC (STAPLE) IMPLANT
RELOAD STAPLE 45 3.5 BLU DVNC (STAPLE) IMPLANT
RELOAD STAPLE 45 4.3 GRN DVNC (STAPLE) IMPLANT
RELOAD STAPLER 2.5X45 WHT DVNC (STAPLE) ×9 IMPLANT
RELOAD STAPLER 3.5X45 BLU DVNC (STAPLE) ×21 IMPLANT
RELOAD STAPLER 4.3X45 GRN DVNC (STAPLE) ×3 IMPLANT
RETRACTOR WOUND ALXS 19CM XSML (INSTRUMENTS) IMPLANT
RTRCTR WOUND ALEXIS 19CM XSML (INSTRUMENTS)
SCISSORS LAP 5X35 DISP (ENDOMECHANICALS) IMPLANT
SEAL CANN UNIV 5-8 DVNC XI (MISCELLANEOUS) ×6 IMPLANT
SEAL XI 5MM-8MM UNIVERSAL (MISCELLANEOUS) ×10
SEALANT PROGEL (MISCELLANEOUS) IMPLANT
SEALANT SURG COSEAL 4ML (VASCULAR PRODUCTS) IMPLANT
SEALANT SURG COSEAL 8ML (VASCULAR PRODUCTS) IMPLANT
SEALER LIGASURE MARYLAND 30 (ELECTROSURGICAL) IMPLANT
SET TUBE SMOKE EVAC HIGH FLOW (TUBING) ×5 IMPLANT
SOLUTION ELECTROLUBE (MISCELLANEOUS) IMPLANT
SPONGE INTESTINAL PEANUT (DISPOSABLE) IMPLANT
SPONGE TONSIL TAPE 1 RFD (DISPOSABLE) IMPLANT
STAPLER 45 SUREFORM CVD (STAPLE) ×5
STAPLER 45 SUREFORM CVD DVNC (STAPLE) IMPLANT
STAPLER CANNULA SEAL DVNC XI (STAPLE) ×6 IMPLANT
STAPLER CANNULA SEAL XI (STAPLE) ×10
STAPLER RELOAD 2.5X45 WHITE (STAPLE) ×15
STAPLER RELOAD 2.5X45 WHT DVNC (STAPLE) ×9
STAPLER RELOAD 3.5X45 BLU DVNC (STAPLE) ×21
STAPLER RELOAD 3.5X45 BLUE (STAPLE) ×35
STAPLER RELOAD 4.3X45 GREEN (STAPLE) ×5
STAPLER RELOAD 4.3X45 GRN DVNC (STAPLE) ×3
STOPCOCK 4 WAY LG BORE MALE ST (IV SETS) ×5 IMPLANT
SUT MNCRL AB 3-0 PS2 18 (SUTURE) IMPLANT
SUT MON AB 2-0 CT1 36 (SUTURE) IMPLANT
SUT PDS AB 1 CTX 36 (SUTURE) IMPLANT
SUT PROLENE 4 0 RB 1 (SUTURE)
SUT PROLENE 4-0 RB1 .5 CRCL 36 (SUTURE) IMPLANT
SUT SILK  1 MH (SUTURE) ×5
SUT SILK 1 MH (SUTURE) ×3 IMPLANT
SUT SILK 1 TIES 10X30 (SUTURE) IMPLANT
SUT SILK 2 0 SH (SUTURE) IMPLANT
SUT SILK 2 0SH CR/8 30 (SUTURE) IMPLANT
SUT VIC AB 1 CTX 36 (SUTURE)
SUT VIC AB 1 CTX36XBRD ANBCTR (SUTURE) IMPLANT
SUT VIC AB 2-0 CT1 27 (SUTURE) ×5
SUT VIC AB 2-0 CT1 TAPERPNT 27 (SUTURE) ×3 IMPLANT
SUT VIC AB 3-0 SH 27 (SUTURE) ×10
SUT VIC AB 3-0 SH 27X BRD (SUTURE) ×6 IMPLANT
SUT VICRYL 0 TIES 12 18 (SUTURE) ×5 IMPLANT
SUT VICRYL 0 UR6 27IN ABS (SUTURE) ×10 IMPLANT
SUT VICRYL 2 TP 1 (SUTURE) IMPLANT
SYR 10ML LL (SYRINGE) ×5 IMPLANT
SYR 20ML LL LF (SYRINGE) ×5 IMPLANT
SYR 50ML LL SCALE MARK (SYRINGE) ×5 IMPLANT
SYSTEM RETRIEVAL ANCHOR 15 (MISCELLANEOUS) ×2 IMPLANT
SYSTEM RETRIEVAL ANCHOR 8 (MISCELLANEOUS) ×2 IMPLANT
SYSTEM SAHARA CHEST DRAIN ATS (WOUND CARE) ×5 IMPLANT
TAPE CLOTH 4X10 WHT NS (GAUZE/BANDAGES/DRESSINGS) ×5 IMPLANT
TAPE CLOTH SURG 4X10 WHT LF (GAUZE/BANDAGES/DRESSINGS) ×2 IMPLANT
TIP APPLICATOR SPRAY EXTEND 16 (VASCULAR PRODUCTS) IMPLANT
TOWEL GREEN STERILE (TOWEL DISPOSABLE) ×5 IMPLANT
TRAY FOLEY MTR SLVR 16FR STAT (SET/KITS/TRAYS/PACK) ×5 IMPLANT
TROCAR BLADELESS 15MM (ENDOMECHANICALS) ×2 IMPLANT
TROCAR XCEL 12X100 BLDLESS (ENDOMECHANICALS) ×5 IMPLANT
TUBING EXTENTION W/L.L. (IV SETS) ×5 IMPLANT

## 2019-08-16 NOTE — Transfer of Care (Signed)
Immediate Anesthesia Transfer of Care Note  Patient: Jonathon Armstrong  Procedure(s) Performed: VIDEO BRONCHOSCOPY (N/A ) XI ROBOTIC ASSISTED THORASCOPY-LEFT LOWER LOBECTOMY (Left Chest) Node Dissection (Chest) Intercostal Nerve Block (Left Chest)  Patient Location: PACU  Anesthesia Type:General  Level of Consciousness: drowsy  Airway & Oxygen Therapy: Patient Spontanous Breathing and Patient connected to face mask oxygen  Post-op Assessment: Report given to RN and Post -op Vital signs reviewed and stable  Post vital signs: Reviewed and stable  Last Vitals:  Vitals Value Taken Time  BP 134/80 08/16/19 1330  Temp    Pulse 78 08/16/19 1332  Resp 27 08/16/19 1332  SpO2 100 % 08/16/19 1332  Vitals shown include unvalidated device data.  Last Pain:  Vitals:   08/16/19 0825  TempSrc:   PainSc: 0-No pain      Patients Stated Pain Goal: 2 (72/55/00 1642)  Complications: No apparent anesthesia complications

## 2019-08-16 NOTE — Anesthesia Procedure Notes (Signed)
Procedure Name: Intubation Date/Time: 08/16/2019 10:04 AM Performed by: Bryson Corona, CRNA Pre-anesthesia Checklist: Patient identified, Emergency Drugs available, Suction available and Patient being monitored Patient Re-evaluated:Patient Re-evaluated prior to induction Oxygen Delivery Method: Circle System Utilized Preoxygenation: Pre-oxygenation with 100% oxygen Induction Type: IV induction Ventilation: Oral airway inserted - appropriate to patient size Laryngoscope Size: Mac and 4 Grade View: Grade II Tube type: Oral Endobronchial tube: Left, EBT position confirmed by fiberoptic bronchoscope and Double lumen EBT and 37 Fr Number of attempts: 1 Airway Equipment and Method: Stylet and Oral airway Placement Confirmation: ETT inserted through vocal cords under direct vision,  positive ETCO2 and breath sounds checked- equal and bilateral Tube secured with: Tape Dental Injury: Teeth and Oropharynx as per pre-operative assessment

## 2019-08-16 NOTE — Plan of Care (Signed)
  Problem: Education: Goal: Knowledge of General Education information will improve Description: Including pain rating scale, medication(s)/side effects and non-pharmacologic comfort measures Outcome: Progressing   Problem: Clinical Measurements: Goal: Ability to maintain clinical measurements within normal limits will improve Outcome: Progressing Goal: Will remain free from infection Outcome: Progressing Goal: Diagnostic test results will improve Outcome: Progressing Goal: Cardiovascular complication will be avoided Outcome: Progressing   

## 2019-08-16 NOTE — Brief Op Note (Signed)
08/16/2019  1:12 PM  PATIENT:  Jonathon Armstrong  69 y.o. male  PRE-OPERATIVE DIAGNOSIS:  pulmonary nodule  POST-OPERATIVE DIAGNOSIS:  pulmonary nodule  PROCEDURE:  Procedure(s): VIDEO BRONCHOSCOPY (N/A) XI ROBOTIC ASSISTED THORASCOPY-LEFT LOWER LOBECTOMY (Left) Node Dissection Intercostal Nerve Block (Left)  SURGEON:  Surgeon(s) and Role:    * Lightfoot, Lucile Crater, MD - Primary  PHYSICIAN ASSISTANT: Toiya Morrish PA-C  ANESTHESIA:   general  EBL:  20 mL   BLOOD ADMINISTERED:none  DRAINS: 1  Chest Tube(s) in the LEFT HEMITHORAX   LOCAL MEDICATIONS USED:  OTHER EXPAREL  SPECIMEN:  Source of Specimen:  LLL WEDGE, LLL COMPLETION LOBECTOMY, LN SAMPLES  DISPOSITION OF SPECIMEN:  PATHOLOGY  COUNTS:  YES  TOURNIQUET:  * No tourniquets in log *  DICTATION: .Dragon Dictation  PLAN OF CARE: Admit to inpatient   PATIENT DISPOSITION:  PACU - hemodynamically stable.   Delay start of Pharmacological VTE agent (>24hrs) due to surgical blood loss or risk of bleeding: yes  COMPLICATIONS: NO KNOWN

## 2019-08-16 NOTE — Discharge Summary (Signed)
Physician Discharge Summary       [address]       [place] [address]             [phone]    Patient ID: Jonathon Armstrong MRN: [id number] DOB/AGE: 1950-06-12 69 y.o.  Admit date: 08/16/2019 Discharge date: 08/19/2019  Admission Diagnoses: Left lower lobe pulmonary nodule  Discharge Diagnoses:  1. Adenocarcinoma LLL 2.  S/P bronchoscopy, robotic assisted left VATS, LLL, mediastinal lymph node sampling, and intercostal nerve block 3. History of Hypertension 4. History of Sleep apnea-on CPAP 5. History of Hyperlipidemia 6. History of Diabetes mellitus without complication ([place]) 7. History of Asystole (HCC)-vagal response"post nasal polypectomy" 8. History of Mitral regurgitation 9. History of tobacco abuse 10. History of diverticulosis  Consults: None  Procedure (s):  Bronchoscopy - Robotic assisted left video thoracoscopy -Left lower lobe wedge resection -Left lower lobectomy - Mediastinal lymph node sampling - Intercostal nerve block by Dr. Kipp Brood on 08/16/2019.  Pathology:  SURGICAL PATHOLOGY  CASE: [id number]  PATIENT: Jonathon Armstrong  Surgical Pathology Report    Clinical History: Pulmonary nodule (cm)   FINAL MICROSCOPIC DIAGNOSIS:   A. LUNG, LEFT LOWER LOBE, WEDGE RESECTION:  - Invasive adenocarcinoma, 1.5 cm.  - No visceral pleura invasion identified.  - See oncology table.   B. LYMPH NODE, HILAR #1, BIOPSY:  - One lymph node, negative for carcinoma (0/1).   C. LYMPH NODE, HILAR #2, BIOPSY:  - One lymph node, negative for carcinoma (0/1).   D. LYMPH NODE, LEVEL 7, BIOPSY:  - One lymph node, negative for carcinoma (0/1).   E. LYMPH NODE, LEVEL 9L, BIOPSY:  - One lymph node, negative for carcinoma (0/1).   F. LUNG, LEFT LOWER LOBE, LOBECTOMY:  - Lung tissue, including all final margins, negative for carcinoma.  - One lymph node, negative for carcinoma (0/1).   History of Presenting Illness: This is a 69 y.o. male  69 year old male referred for surgical evaluation of a left lower lobe very nodule that was found incidentally in January of this year. The nodule was 1.3 cm and remained stable subsequent left CT scans. He did undergo a PET/CT which showed an SUV max 3 and no suspicious lymphadenopathy. He denies any pulmonary or cardiovascular symptoms. He also denies any neurologic symptoms. 15 years ago he had a vagal episode nasal procedure where he required chest compressions. He has not had any other complications with anesthesia previously. He has been a lifelong non-smoker. In summary, this is a 69 year old male with left lower lobe pulmonary nodule with mild avidity on PET scan. He is concerned that this may be lung cancer this would like it removed for the present biopsy. The risk and benefits of bronchoscopy, robotic assisted left wedge resection, and possible lobectomy were discussed in detail. He is agreeable to proceed and is tentatively scheduled for May 26. In regards to his history of cardiac arrest following a nasal procedure 15 years ago, given that he has had an extensive cardiac work-up in recent stress test which was negative, Dr. Kipp Brood was confident that we will be able to proceed without any cardiac complications. PFTs results showed: FVC-Pre L 3.13   FVC-%Pred-Pre % 70    FEV1-Pre L 2.47   FEV1-%Pred-Pre % 75   FEV1-Post L 2.67   FEV1-%Pred-Post % 82    Brief Hospital Course:   Patient presented to [facility] on [facility]16[facility]2019 in order to undergo bronochscopy, robotic assisted left VATS, wedge LLL followed by LLL,  mediastinal lymph node sampling, and intercostal nerve block. He was extubated and taken to the PACU in stable condition.  His chest tube exhibited and air leak on water seal.  His CXR remained clinically stable with a small left apical pneumothorax.  His chest tube was removed on 08/18/2019.  His follow up CXR showed Stable to slightly decreased left pneumothorax.  The patient remained  hemodynamically stable in NSR on his home regimen of cardiac medications.  His blood sugars have been controlled with Metformin.  He is ambulating in the hallway independently.  His incisions are healing without evidence of infection.  He is medically stable for discharge home today.  Latest Vital Signs: Blood pressure 139/81, pulse 74, temperature 98.1 F (36.7 C), temperature source Oral, resp. rate 19, height 5\' 10"  (1.778 m), weight 100.7 kg, SpO2 100 %.  Physical Exam:  General appearance: alert, cooperative and no distress Heart: regular rate and rhythm, S1, S2 normal, no murmur, click, rub or gallop Lungs: clear to auscultation bilaterally Abdomen: soft, non-tender; bowel sounds normal; no masses,  no organomegaly Extremities: extremities normal, atraumatic, no cyanosis or edema Wound: clean and dry  Discharge Condition: Stable and discharged to home.  Recent laboratory studies:  Lab Results  Component Value Date   WBC 10.9 (H) 08/18/2019   HGB 12.9 (L) 08/18/2019   HCT 38.1 (L) 08/18/2019   MCV 89.6 08/18/2019   PLT 143 (L) 08/18/2019   Lab Results  Component Value Date   NA 137 08/18/2019   K 4.4 08/18/2019   CL 103 08/18/2019   CO2 23 08/18/2019   CREATININE 1.49 (H) 08/18/2019   GLUCOSE 180 (H) 08/18/2019      Diagnostic Studies: DG Chest 2 View  Result Date: 08/15/2019 CLINICAL DATA:  Preop evaluation for upcoming bronchoscopy EXAM: CHEST - 2 VIEW COMPARISON:  08/03/2019 FINDINGS: Cardiac shadow is within normal limits. The lungs are clear bilaterally. Known left lower lobe pulmonary nodule is not well appreciated on this exam. No bony abnormality is seen. IMPRESSION: No active cardiopulmonary disease. Electronically Signed   By: Inez Catalina M.D.   On: 08/15/2019 09:39   NM PET Image Initial (PI) Skull Base To Thigh  Result Date: 08/03/2019 CLINICAL DATA:  Initial treatment strategy for left lower lobe pulmonary nodule. EXAM: NUCLEAR MEDICINE PET SKULL BASE TO  THIGH TECHNIQUE: 11.3 mCi F-18 FDG was injected intravenously. Full-ring PET imaging was performed from the skull base to thigh after the radiotracer. CT data was obtained and used for attenuation correction and anatomic localization. Fasting blood glucose: 160 mg/dl COMPARISON:  Chest CT on 07/11/2019 FINDINGS: Mediastinal blood-pool activity (background): SUV max = 3.5 Liver activity (reference): SUV max = N/A NECK:  No hypermetabolic lymph nodes or masses. Incidental CT findings:  None. CHEST: 13 mm pulmonary nodule in the superior segment of the left lower lobe remains stable in size. This shows low-grade FDG uptake, with SUV max of 3.0. No other suspicious pulmonary nodules or masses seen on CT images. No hypermetabolic lymphadenopathy. No evidence of pleural effusion. Incidental CT findings:  None. ABDOMEN/PELVIS: No abnormal hypermetabolic activity within the liver, pancreas, adrenal glands, or spleen. No hypermetabolic lymph nodes in the abdomen or pelvis. Incidental CT findings: Gallstones are seen, however there is no evidence of cholecystitis or biliary dilatation. Several tiny less than 5 mm left renal calculi are seen, however there is no evidence of ureteral calculi or hydronephrosis. Bilateral renal cysts noted. Aortic atherosclerosis demonstrated. Diffuse colonic diverticulosis is again noted, however  there is no evidence of diverticulitis. Mildly enlarged prostate. SKELETON: No focal hypermetabolic bone lesions to suggest skeletal metastasis. Incidental CT findings:  None. IMPRESSION: Stable 13 mm left lower lobe pulmonary nodule shows low-grade FDG uptake, suspicious for low-grade bronchogenic carcinoma. No evidence of metastatic disease. Electronically Signed   By: Marlaine Hind M.D.   On: 08/03/2019 14:06   DG CHEST PORT 1 VIEW  Result Date: 08/19/2019 CLINICAL DATA:  Status post lobectomy. EXAM: PORTABLE CHEST 1 VIEW COMPARISON:  08/18/2019 FINDINGS: Left pneumothorax persists, stable to  slightly decreased in the interval. Right lung remains clear. Cardiopericardial silhouette is at upper limits of normal for size. The visualized bony structures of the thorax are intact. Telemetry leads overlie the chest. IMPRESSION: Stable to slightly decreased left pneumothorax. Electronically Signed   By: Misty Stanley M.D.   On: 08/19/2019 08:40   DG Chest Port 1 View  Result Date: 08/18/2019 CLINICAL DATA:  Follow-up pneumothorax EXAM: PORTABLE CHEST 1 VIEW COMPARISON:  08/18/2019, 08/17/2019 FINDINGS: Interval removal of left-sided chest tube. Increased size of left pneumothorax, now moderate in size with atelectasis of left lung. No midline shift. Perihilar region postsurgical change. Enlarged cardiomediastinal silhouette. IMPRESSION: Interval removal of left chest tube with increased size of left pneumothorax, now at least moderate in size, but without evidence for tension or midline shift to the right. Critical Value/emergent results were called by telephone at the time of interpretation on 08/18/2019 at 7:42 pm to provider HARRELL LIGHTFOOT , who verbally acknowledged these results. Electronically Signed   By: Donavan Foil M.D.   On: 08/18/2019 19:42   DG CHEST PORT 1 VIEW  Result Date: 08/18/2019 CLINICAL DATA:  Chest tube, post lobectomy EXAM: PORTABLE CHEST 1 VIEW COMPARISON:  Portable exam 0642 hours compared to 08/17/2019 FINDINGS: LEFT thoracostomy tube unchanged. Enlargement of cardiac silhouette. Mediastinal contours and pulmonary vascularity normal. Bibasilar atelectasis. Small LEFT apex pneumothorax. No pleural effusion or segmental consolidation. IMPRESSION: Small LEFT pneumothorax despite thoracostomy tube. Bibasilar atelectasis. Electronically Signed   By: Lavonia Dana M.D.   On: 08/18/2019 08:43   DG CHEST PORT 1 VIEW  Result Date: 08/17/2019 CLINICAL DATA:  Follow-up chest tube EXAM: PORTABLE CHEST 1 VIEW COMPARISON:  08/16/2019 FINDINGS: Cardiac shadow is stable. Left-sided chest  tube is noted in satisfactory position. Previously seen left pneumothorax is again noted and stable. No focal infiltrate is seen. No bony abnormality is noted. IMPRESSION: Stable tiny left pneumothorax. Electronically Signed   By: Inez Catalina M.D.   On: 08/17/2019 09:34   DG Chest Port 1 View  Result Date: 08/16/2019 CLINICAL DATA:  Status post left bronchoscopy and thoracoscopy EXAM: PORTABLE CHEST 1 VIEW COMPARISON:  08/15/2019 FINDINGS: Cardiac shadow is prominent but stable. Left chest tube is noted in satisfactory position. Tiny left apical pneumothorax is noted. Right basilar atelectasis is noted. No bony abnormality is noted. IMPRESSION: Increased right basilar atelectasis. Left chest tube in place with tiny pneumothorax in the apex. Electronically Signed   By: Inez Catalina M.D.   On: 08/16/2019 13:53     Discharge Medications: Allergies as of 08/19/2019      Reactions   Diltiazem Hcl Other (See Comments), Cough   ? symptoms   Penicillins    Rash with Amoxicillin with Mono in college      Medication List    STOP taking these medications   ibuprofen 200 MG tablet Commonly known as: ADVIL     TAKE these medications   acetaminophen 500 MG tablet  Commonly known as: TYLENOL Take 500-1,000 mg by mouth every 6 (six) hours as needed (for pain.).   albuterol 108 (90 Base) MCG/ACT inhaler Commonly known as: Ventolin HFA Inhale 2 puffs into the lungs every 6 (six) hours as needed for wheezing or shortness of breath.   Arnuity Ellipta 100 MCG/ACT Aepb Generic drug: Fluticasone Furoate INHALE 1 PUFF ONCE DAILY. What changed: See the new instructions.   Bayer Low Strength 81 MG EC tablet Generic drug: aspirin Take 81 mg by mouth daily.   cholecalciferol 25 MCG (1000 UNIT) tablet Commonly known as: VITAMIN D3 Take 1,000 Units by mouth daily.   ezetimibe-simvastatin 10-20 MG tablet Commonly known as: VYTORIN TAKE (1) TABLET DAILY AT BEDTIME. What changed: See the new  instructions.   labetalol 200 MG tablet Commonly known as: NORMODYNE Take 1 tablet (200 mg total) by mouth 2 (two) times daily. T   metFORMIN 500 MG tablet Commonly known as: GLUCOPHAGE TAKE 2 TABLETS TWICE DAILY WITH FOOD. What changed: See the new instructions.   spironolactone 25 MG tablet Commonly known as: ALDACTONE TAKE 1 TABLET ONCE DAILY.   telmisartan 80 MG tablet Commonly known as: MICARDIS Take 1 tablet (80 mg total) by mouth daily.   traMADol 50 MG tablet Commonly known as: ULTRAM Take 1 tablet (50 mg total) by mouth every 12 (twelve) hours as needed (mild pain).   vitamin C 1000 MG tablet Take 1,000 mg by mouth daily.   Zinc 50 MG Tabs Take 50 mg by mouth daily.       Follow Up Appointments: Follow-up Information    Lajuana Matte, MD Follow up on 08/26/2019.   Specialty: Cardiothoracic Surgery Why: Appointment is at 9:45 Contact information: [address] [place] [address] [phone]           Signed: Terance Hart ContePA-C 08/19/2019, 9:01 AM

## 2019-08-16 NOTE — Anesthesia Postprocedure Evaluation (Signed)
Anesthesia Post Note  Patient: Jonathon Armstrong  Procedure(s) Performed: VIDEO BRONCHOSCOPY (N/A ) XI ROBOTIC ASSISTED THORASCOPY-LEFT LOWER LOBECTOMY (Left Chest) Node Dissection (Chest) Intercostal Nerve Block (Left Chest)     Patient location during evaluation: PACU Anesthesia Type: General Level of consciousness: awake and alert Pain management: pain level controlled Vital Signs Assessment: post-procedure vital signs reviewed and stable Respiratory status: spontaneous breathing, nonlabored ventilation, respiratory function stable and patient connected to nasal cannula oxygen Cardiovascular status: blood pressure returned to baseline and stable Postop Assessment: no apparent nausea or vomiting Anesthetic complications: no    Last Vitals:  Vitals:   08/16/19 1505 08/16/19 1610  BP: (!) 145/84 (!) 145/76  Pulse: 74 76  Resp: 16 16  Temp: 36.4 C 36.7 C  SpO2: 97% 96%    Last Pain:  Vitals:   08/16/19 1610  TempSrc: Oral  PainSc:                  [name]

## 2019-08-16 NOTE — Anesthesia Procedure Notes (Signed)
Arterial Line Insertion Start/End5/25/2021 9:30 AM, 08/16/2019 9:35 AM Performed by: CRNA  Preanesthetic checklist: patient identified, IV checked, site marked, risks and benefits discussed, surgical consent, monitors and equipment checked, pre-op evaluation, timeout performed and anesthesia consent Lidocaine 1% used for infiltration Right, radial was placed Catheter size: 20 G Hand hygiene performed  and maximum sterile barriers used   Attempts: 1 Procedure performed without using ultrasound guided technique. Following insertion, Biopatch and dressing applied. Post procedure assessment: normal  Patient tolerated the procedure well with no immediate complications. Additional procedure comments: Inserted by Jeri Cos SRNA.

## 2019-08-16 NOTE — H&P (Signed)
Jonathon Armstrong       [address],[place] [address]             [phone]        Jonathon Armstrong  [facility] Medical Record #[id number]  Date of Birth: [birth year]  Referring: Jonathon Nash, DO  Primary Care: Jonathon Rail, MD  Primary Cardiologist: No primary care provider on file.   No events since his clinic appointment OR today for bronchoscopy, L RATS, wedge resection of the lower lobe, possible lobectomy   Chief Complaint:      Chief Complaint  Patient presents with  . Lung Lesion    Consultation, PET 08/03/19, Chest CT 07/11/19   History of Present Illness:  Jonathon Armstrong 69 y.o. male 69 year old male referred for surgical evaluation of a left lower lobe very nodule that was found incidentally in January of this year. The nodule was 1.3 cm and remained stable subsequent left CT scans. He did undergo a PET/CT which showed an SUV max 3 and no suspicious lymphadenopathy. He denies any pulmonary or cardiovascular symptoms. He also denies any neurologic symptoms. 15 years ago he had a vagal episode nasal procedure where he required chest compressions. He has not had any other complications with anesthesia previously.  Smoking Hx:  Lifelong non-smoker  Zubrod Score:  At the time of surgery this patient's most appropriate activity status/level should be described as:  ? 0 Normal activity, no symptoms  ? 1 Restricted in physical strenuous activity but ambulatory, able to do out light work  ? 2 Ambulatory and capable of self care, unable to do work activities, up and about >50 % of waking hours  ? 3 Only limited self care, in bed greater than 50% of waking hours  ? 4 Completely disabled, no self care, confined to bed or chair  ? 5 Moribund      Past Medical History:  Diagnosis Date  . Allergy    seasonal  . Asystole ([place])    "vagal response"post nasal polypectomy  . Diabetes mellitus without complication ([place])    pre but take metformin  . Diverticulosis of colon    . FH: colonic polyps    hx of  . Hyperlipidemia   . Hypertension   . Mitral regurgitation    mild & aortic Valve calcification on 2d echo;sbe prophylaxis  . Parotitis, acute 2013  . Seasonal rhinitis   . Sleep apnea    CPAP        Past Surgical History:  Procedure Laterality Date  . APPENDECTOMY    . asytole post nasal polypectomy  2008  . colonoscopy with polypectomy  2010   Dr Jonathon Armstrong  . heriorraphy     x2  . HERNIA REPAIR    . MIDDLE EAR SURGERY     TM replacement  . NASAL SINUS SURGERY  1994, [id number]   X3  . TONSILLECTOMY AND ADENOIDECTOMY          Family History  Problem Relation Age of Onset  . Cancer Mother    liver &multiple myeloma  . Heart disease Mother    CABG, Angina  . Hyperlipidemia Mother   . Hypertension Father   . Heart disease Father    MI in 60"s  . Dementia Father   . Cancer Maternal Grandfather    bone  . Other Sister    perforated bowel  . Other Sister    cns aneurysm  . Diabetes Neg Hx   .  Stroke Neg Hx   . Colon cancer Neg Hx    Social History      Tobacco Use  Smoking Status Never Smoker  Smokeless Tobacco Never Used   Social History       Substance and Sexual Activity  Alcohol Use Yes  . Alcohol/week: 2.0 standard drinks  . Types: 1 Glasses of wine, 1 Shots of liquor per week   Comment: socially < 2/ week        Allergies  Allergen Reactions  . Diltiazem Hcl     ? symptoms  . Penicillins     Rash with Amoxicillin with Mono in college         Current Outpatient Medications  Medication Sig Dispense Refill  . albuterol (VENTOLIN HFA) 108 (90 Base) MCG/ACT inhaler Inhale 2 puffs into the lungs every 6 (six) hours as needed for wheezing or shortness of breath. 1 Inhaler 5  . ARNUITY ELLIPTA 100 MCG/ACT AEPB INHALE 1 PUFF ONCE DAILY. 30 each 5  . Ascorbic Acid (VITAMIN C) 1000 MG tablet Take 1,000 mg by mouth daily.    Marland Kitchen aspirin (BAYER LOW STRENGTH) 81 MG EC tablet Take 81 mg by mouth daily.     . cholecalciferol  (VITAMIN D3) 25 MCG (1000 UNIT) tablet Take 1,000 Units by mouth daily.    Marland Kitchen ezetimibe-simvastatin (VYTORIN) 10-20 MG tablet TAKE (1) TABLET DAILY AT BEDTIME. 90 tablet 0  . labetalol (NORMODYNE) 200 MG tablet Take 1 tablet (200 mg total) by mouth 2 (two) times daily. T 180 tablet 3  . metFORMIN (GLUCOPHAGE) 500 MG tablet TAKE 2 TABLETS TWICE DAILY WITH FOOD. 360 tablet 1  . spironolactone (ALDACTONE) 25 MG tablet TAKE 1 TABLET ONCE DAILY. 90 tablet 1  . telmisartan (MICARDIS) 80 MG tablet Take 1 tablet (80 mg total) by mouth daily. 90 tablet 3  . Zinc Sulfate (ZINC 15 PO) Take 1 tablet by mouth daily.     No current facility-administered medications for this visit.   Review of Systems  Constitutional: Negative.  HENT: Negative.  Respiratory: Negative.  Cardiovascular: Negative.  Musculoskeletal: Negative.  Neurological: Negative.   PHYSICAL EXAMINATION:  BP 122/68 (BP Location: Left Arm, Patient Position: Sitting, Cuff Size: Large)  Pulse 79  Temp (!) 97.3 F (36.3 C) (Temporal)  Resp 20  Ht '5\' 10"'  (1.778 m)  Wt 221 lb (100.2 kg)  SpO2 97% Comment: RA  BMI 31.71 kg/m  Physical Exam  Constitutional: He is oriented to person, place, and time. He appears well-developed and well-nourished. No distress.  HENT:  Head: Normocephalic and atraumatic.  Eyes: Conjunctivae are normal.  Neck: No tracheal deviation present.  Cardiovascular: Normal rate.  Respiratory: No respiratory distress.  GI: Soft. He exhibits no distension.  Musculoskeletal:  General: Normal range of motion.  Cervical back: Normal range of motion.  Neurological: He is alert and oriented to person, place, and time.  Skin: Skin is warm and dry. He is not diaphoretic.   Diagnostic Studies & Laboratory data:  Recent Radiology Findings:  Imaging Results    I have independently reviewed the above radiology studies and reviewed the findings with the patient.  Recent Lab Findings:  Recent Labs  PFTs:  Pending  Assessment / Plan:  69 year old male with left lower lobe pulmonary nodule with mild avidity on PET scan. He is concerned that this may be lung cancer this would like it removed for the present biopsy. The risk and benefits of bronchoscopy, robotic assisted left wedge resection, and possible lobectomy were discussed in detail. He is agreeable to proceed and is tentatively scheduled for May 26. In regards to his history of cardiac arrest following a nasal procedure 15 years ago, given that he has had an extensive cardiac work-up in recent stress test which was negative, I am confident that we will be able to proceed without any cardiac complications. He will require pulmonary function testing prior to surgery.

## 2019-08-16 NOTE — Op Note (Signed)
[address]       [place] [address]             [phone]        08/16/2019  Patient:  Jonathon Armstrong Pre-Op Dx: Left lower lobe pulmonary nodule Post-op Dx: Left lower lobe adenocarcinoma Procedure: - Bronchoscopy - Robotic assisted left video thoracoscopy -Left lower lobe wedge resection -Left lower lobectomy - Mediastinal lymph node sampling - Intercostal nerve block  Surgeon and Role:      * Claire Bridge, Lucile Crater, MD - Primary    *Jadene Pierini, PA-C- assisting  Anesthesia  general EBL: Minimal  Blood Administration: None Specimen: Wedge resection left lower lobe.  Left lower lobectomy.  Hilar lymph nodes.  Level 7&9 nodes  Drains: 53 F argyle chest tube in left chest Counts: correct   Indications: 69 year old male with left lower lobe pulmonary nodule with mild avidity on PET scan. He is concerned that this may be lung cancer and would like it removed. The risk and benefits of bronchoscopy, robotic assisted left wedge resection, and possible lobectomy were discussed in detail. He is agreeable to proceed. In regards to his history of cardiac arrest following a nasal procedure 15 years ago, given that he has had an extensive cardiac work-up in recent stress test which was negative, I am confident that we will be able to proceed without any cardiac complications.   Findings: Pleural dimpling in the left lower lobe.  This was wedged out specimen was positive for adenocarcinoma.  Normal anatomy.  Soft lymph nodes.  Operative Technique: After the risks, benefits and alternatives were thoroughly discussed, the patient was brought to the operative theatre.  Anesthesia was induced, and the bronchoscope was passed through the endotracheal tube.  All segmental bronchi were visualized.  The endotracheal tube was then exchanged for a double lumen tube.  The patient was then placed in a right lateral decubitus position and was prepped and draped in normal  sterile fashion.  An appropriate surgical pause was performed, and pre-operative antibiotics were dosed accordingly.  We began by placing our 4 robotic ports in the the 7th intercostal space targeting the hilum of the lung.  A 21mm assistant port was placed in the 9th intercostal space in the anterior axillary line.  The robot was then docked and all instruments were passed under direct visualization.    The nodule was evident in the left lower lobe.  This was wedged out between several sequential firings of the stapler.  It was passed through an Endo Catch bag and down to pathology.  The results were positive for adenocarcinoma.  We then elected to proceed with a completion lobectomy.  The lung was then retracted superiorly, and the inferior pulmonary ligament was divided.  The hilum was mobilized anteriorly and posteriorly.  We identified the lower lobe vein, and after careful isolation, it was divided with a vascular stapler.  We next moved to the fissure.  The artery was then divided with a vascular load stapler.  The bronchus to the lower lobe was then isolated.  After a test clamp, with good ventilation of the upper lobe, the bronchus was then divided.  The fissure was completed, and the specimen was passed into an endocatch bag.  It was removed from the superior access site.    Lymph nodes were then sampled at levels 9 and 7.  There were no obvious nodes at stations 5 and 6.  The chest was  irrigated, and an air leak test was performed.  An intercostal nerve block was performed under direct visualization.  A 28 F chest with then placed, and we watch the remaining lobes re-expand.  The skin and soft tissue were closed with absorbable suture    The patient tolerated the procedure without any immediate complications, and was transferred to the PACU in stable condition.  Julietta Batterman Bary Leriche

## 2019-08-17 ENCOUNTER — Inpatient Hospital Stay (HOSPITAL_COMMUNITY): Payer: Medicare Other

## 2019-08-17 LAB — BASIC METABOLIC PANEL
Anion gap: 11 (ref 5–15)
BUN: 25 mg/dL — ABNORMAL HIGH (ref 8–23)
CO2: 24 mmol/L (ref 22–32)
Calcium: 9.5 mg/dL (ref 8.9–10.3)
Chloride: 102 mmol/L (ref 98–111)
Creatinine, Ser: 1.49 mg/dL — ABNORMAL HIGH (ref 0.61–1.24)
GFR calc Af Amer: 54 mL/min — ABNORMAL LOW (ref >60)
GFR calc non Af Amer: 47 mL/min — ABNORMAL LOW (ref >60)
Glucose, Bld: 164 mg/dL — ABNORMAL HIGH (ref 70–99)
Potassium: 5.1 mmol/L (ref 3.5–5.1)
Sodium: 137 mmol/L (ref 135–145)

## 2019-08-17 LAB — BLOOD GAS, ARTERIAL
Acid-base deficit: 0.6 mmol/L (ref 0.0–2.0)
Bicarbonate: 23.3 mmol/L (ref 20.0–28.0)
FIO2: 21
O2 Saturation: 96.1 %
Patient temperature: 36.8
pCO2 arterial: 36.3 mmHg (ref 32.0–48.0)
pH, Arterial: 7.422 (ref 7.350–7.450)
pO2, Arterial: 77.2 mmHg — ABNORMAL LOW (ref 83.0–108.0)

## 2019-08-17 LAB — GLUCOSE, CAPILLARY
Glucose-Capillary: 165 mg/dL — ABNORMAL HIGH (ref 70–99)
Glucose-Capillary: 145 mg/dL — ABNORMAL HIGH (ref 70–99)
Glucose-Capillary: 175 mg/dL — ABNORMAL HIGH (ref 70–99)
Glucose-Capillary: 183 mg/dL — ABNORMAL HIGH (ref 70–99)

## 2019-08-17 LAB — CBC
HCT: 38.6 % — ABNORMAL LOW (ref 39.0–52.0)
Hemoglobin: 13.1 g/dL (ref 13.0–17.0)
MCH: 30.2 pg (ref 26.0–34.0)
MCHC: 33.9 g/dL (ref 30.0–36.0)
MCV: 88.9 fL (ref 80.0–100.0)
Platelets: 161 10*3/uL (ref 150–400)
RBC: 4.34 MIL/uL (ref 4.22–5.81)
RDW: 13.1 % (ref 11.5–15.5)
WBC: 12.2 10*3/uL — ABNORMAL HIGH (ref 4.0–10.5)
nRBC: 0 % (ref 0.0–0.2)

## 2019-08-17 MED ORDER — EZETIMIBE 10 MG PO TABS
10.0000 mg | ORAL_TABLET | Freq: Every day | ORAL | Status: DC
  Administered 2019-08-17 – 2019-08-18 (×2): 10 mg via ORAL
  Filled 2019-08-17 (×2): qty 1

## 2019-08-17 MED ORDER — SIMVASTATIN 20 MG PO TABS
20.0000 mg | ORAL_TABLET | Freq: Every day | ORAL | Status: DC
  Administered 2019-08-17 – 2019-08-18 (×2): 20 mg via ORAL
  Filled 2019-08-17 (×2): qty 1

## 2019-08-17 NOTE — Progress Notes (Addendum)
      [address]       [place] [address]             [phone]      1 Day Post-Op Procedure(s) (LRB): VIDEO BRONCHOSCOPY (N/A) XI ROBOTIC ASSISTED THORASCOPY-LEFT LOWER LOBECTOMY (Left) Node Dissection Intercostal Nerve Block (Left)   Subjective:  Up in chair, states doing pretty well.  He has already ambulated in the hallways this morning.  Objective: Vital signs in last 24 hours: Temp:  [97.3 F (36.3 C)-98.4 F (36.9 C)] 98.1 F (36.7 C) (05/26 0813) Pulse Rate:  [70-79] 73 (05/26 0813) Cardiac Rhythm: Normal sinus rhythm (05/26 0714) Resp:  [15-20] 18 (05/26 0813) BP: (113-159)/(66-97) 134/74 (05/26 0813) SpO2:  [95 %-100 %] 96 % (05/26 0813) Arterial Line BP: (145-155)/(76-77) 155/77 (05/25 1345)  Intake/Output from previous day: 05/25 0701 - 05/26 0700 In: 1250 [I.V.:1250] Out: 825 [Urine:750; Blood:20; Chest Tube:55]  General appearance: alert, cooperative and no distress Heart: regular rate and rhythm Lungs: clear to auscultation bilaterally Abdomen: soft, non-tender; bowel sounds normal; no masses,  no organomegaly Extremities: extremities normal, atraumatic, no cyanosis or edema Wound: clean and dry  Lab Results: Recent Labs    08/15/19 0854 08/17/19 0240  WBC 6.4 12.2*  HGB 13.6 13.1  HCT 40.4 38.6*  PLT 160 161   BMET:  Recent Labs    08/15/19 0854 08/17/19 0240  NA 137 137  K 4.5 5.1  CL 106 102  CO2 20* 24  GLUCOSE 153* 164*  BUN 16 25*  CREATININE 1.20 1.49*  CALCIUM 9.9 9.5    PT/INR:  Recent Labs    08/15/19 0854  LABPROT 12.9  INR 1.0   ABG    Component Value Date/Time   PHART 7.422 08/17/2019 0355   HCO3 23.3 08/17/2019 0355   ACIDBASEDEF 0.6 08/17/2019 0355   O2SAT 96.1 08/17/2019 0355   CBG (last 3)  Recent Labs    08/16/19 1609 08/16/19 2121 08/17/19 0629  GLUCAP 176* 172* 165*    Assessment/Plan: S/P Procedure(s) (LRB): VIDEO BRONCHOSCOPY (N/A) XI ROBOTIC ASSISTED THORASCOPY-LEFT  LOWER LOBECTOMY (Left) Node Dissection Intercostal Nerve Block (Left)  1. CV- NSR, H/o HTN- on home Avapro, Labatelol 2. Pulm- CT + air leak with cough, CXR shows trace apical space on the left... leave chest tube in place to water seal today 3. Pain control- creatinine is elevated, will d/c Toradol 4. DM- sugars controlled, continue Metformin 5. Lovenox for DVT prophylaxis 6. Dispo- patient doing very well, on home cardiac medications, + air leak with cough, CXR w/o significant pneumothorax, leave CT in place today, will stop Toradol with elevated creatinine of 1.56, repeat CXR in AM   LOS: 1 day    [name], PA-C  08/17/2019   Doing well. Chest tube to waterseal

## 2019-08-18 ENCOUNTER — Inpatient Hospital Stay (HOSPITAL_COMMUNITY): Payer: Medicare Other

## 2019-08-18 ENCOUNTER — Encounter (HOSPITAL_COMMUNITY): Payer: Self-pay | Admitting: Thoracic Surgery (Cardiothoracic Vascular Surgery)

## 2019-08-18 LAB — COMPREHENSIVE METABOLIC PANEL
ALT: 19 U/L (ref 0–44)
AST: 19 U/L (ref 15–41)
Albumin: 3.6 g/dL (ref 3.5–5.0)
Alkaline Phosphatase: 59 U/L (ref 38–126)
Anion gap: 11 (ref 5–15)
BUN: 35 mg/dL — ABNORMAL HIGH (ref 8–23)
CO2: 23 mmol/L (ref 22–32)
Calcium: 9.1 mg/dL (ref 8.9–10.3)
Chloride: 103 mmol/L (ref 98–111)
Creatinine, Ser: 1.49 mg/dL — ABNORMAL HIGH (ref 0.61–1.24)
GFR calc Af Amer: 54 mL/min — ABNORMAL LOW (ref >60)
GFR calc non Af Amer: 47 mL/min — ABNORMAL LOW (ref >60)
Glucose, Bld: 180 mg/dL — ABNORMAL HIGH (ref 70–99)
Potassium: 4.4 mmol/L (ref 3.5–5.1)
Sodium: 137 mmol/L (ref 135–145)
Total Bilirubin: 0.6 mg/dL (ref 0.3–1.2)
Total Protein: 6.3 g/dL — ABNORMAL LOW (ref 6.5–8.1)

## 2019-08-18 LAB — CBC
HCT: 38.1 % — ABNORMAL LOW (ref 39.0–52.0)
Hemoglobin: 12.9 g/dL — ABNORMAL LOW (ref 13.0–17.0)
MCH: 30.4 pg (ref 26.0–34.0)
MCHC: 33.9 g/dL (ref 30.0–36.0)
MCV: 89.6 fL (ref 80.0–100.0)
Platelets: 143 10*3/uL — ABNORMAL LOW (ref 150–400)
RBC: 4.25 MIL/uL (ref 4.22–5.81)
RDW: 13.5 % (ref 11.5–15.5)
WBC: 10.9 10*3/uL — ABNORMAL HIGH (ref 4.0–10.5)
nRBC: 0 % (ref 0.0–0.2)

## 2019-08-18 LAB — GLUCOSE, CAPILLARY: Glucose-Capillary: 168 mg/dL — ABNORMAL HIGH (ref 70–99)

## 2019-08-18 LAB — SURGICAL PATHOLOGY

## 2019-08-18 NOTE — Plan of Care (Signed)

## 2019-08-18 NOTE — Progress Notes (Addendum)
      [address]       [place] [address]             [phone]      2 Days Post-Op Procedure(s) (LRB): VIDEO BRONCHOSCOPY (N/A) XI ROBOTIC ASSISTED THORASCOPY-LEFT LOWER LOBECTOMY (Left) Node Dissection Intercostal Nerve Block (Left)   Subjective:  Patient doing well.  He did notice an increase pain this morning has his numbing medication has started to wear off.  + ambulation around the hallways independently.  Objective: Vital signs in last 24 hours: Temp:  [97.9 F (36.6 C)-98.7 F (37.1 C)] 98.7 F (37.1 C) (05/27 0727) Pulse Rate:  [63-82] 82 (05/27 0747) Cardiac Rhythm: Normal sinus rhythm (05/27 0727) Resp:  [15-22] 17 (05/27 0747) BP: (112-146)/(66-86) 136/66 (05/27 0727) SpO2:  [95 %-98 %] 95 % (05/27 0727)  Intake/Output from previous day: 05/26 0701 - 05/27 0700 In: 120 [P.O.:120] Out: 68 [Chest Tube:68]  General appearance: alert, cooperative and no distress Heart: regular rate and rhythm Lungs: clear to auscultation bilaterally Abdomen: soft, non-tender; bowel sounds normal; no masses,  no organomegaly Extremities: extremities normal, atraumatic, no cyanosis or edema Wound: clean and dry  Lab Results: Recent Labs    08/17/19 0240 08/18/19 0218  WBC 12.2* 10.9*  HGB 13.1 12.9*  HCT 38.6* 38.1*  PLT 161 143*   BMET:  Recent Labs    08/17/19 0240 08/18/19 0218  NA 137 137  K 5.1 4.4  CL 102 103  CO2 24 23  GLUCOSE 164* 180*  BUN 25* 35*  CREATININE 1.49* 1.49*  CALCIUM 9.5 9.1    PT/INR:  Recent Labs    08/15/19 0854  LABPROT 12.9  INR 1.0   ABG    Component Value Date/Time   PHART 7.422 08/17/2019 0355   HCO3 23.3 08/17/2019 0355   ACIDBASEDEF 0.6 08/17/2019 0355   O2SAT 96.1 08/17/2019 0355   CBG (last 3)  Recent Labs    08/17/19 1631 08/17/19 2113 08/18/19 0626  GLUCAP 175* 183* 168*    Assessment/Plan: S/P Procedure(s) (LRB): VIDEO BRONCHOSCOPY (N/A) XI ROBOTIC ASSISTED THORASCOPY-LEFT  LOWER LOBECTOMY (Left) Node Dissection Intercostal Nerve Block (Left)  1. CV- NSR, H/O HTN- continue home Avapro, Labatelol 2. Pulm- CT with + air leak with cough, CT output 68 cc yesterday- will likely leave chest tube in place today 3. Pain control- continue Oxy, Tramadol 4. DM- sugars remain controlled 5. Dispo- patient stable, air leak persists, CXR is stable... likely leave chest tube in place today, continue current care   LOS: 2 days    [name], PA-C  08/18/2019   Chest x-ray showed an enlarged pneumothorax with chest tube. Placement nasal cannula and encourage incentive spirometry. If the pneumothorax remains present tomorrow six pigtail catheter  Jonathon Armstrong

## 2019-08-18 NOTE — Discharge Instructions (Signed)
Video-Assisted Thoracic Surgery, Care After This sheet gives you information about how to care for yourself after your procedure. Your health care provider may also give you more specific instructions. If you have problems or questions, contact your health care provider. What can I expect after the procedure? After the procedure, it is common to have:  Some pain and soreness in your chest.  Pain when breathing in (inhaling) and coughing.  Constipation.  Fatigue.  Difficulty sleeping. Follow these instructions at home: Preventing pneumonia  Take deep breaths or do breathing exercises as instructed by your health care provider. Doing this helps prevent lung infection (pneumonia).  Cough frequently. Coughing may cause discomfort, but it is important to clear mucus (phlegm) and expand your lungs. If it hurts to cough, hold a pillow against your chest or place the palms of both hands on top of the incision (use splinting) when you cough. This may help relieve discomfort.  If you were given an incentive spirometer, use it as directed. An incentive spirometer is a tool that measures how well you are filling your lungs with each breath.  Participate in pulmonary rehabilitation as directed by your health care provider. This is a program that combines education, exercise, and support from a team of specialists. The goal is to help you heal and get back to your normal activities as soon as possible. Medicines  Take over-the-counter or prescription medicines only as told by your health care provider.  If you have pain, take pain-relieving medicine before your pain becomes severe. This is important because if your pain is under control, you will be able to breathe and cough more comfortably.  If you were prescribed an antibiotic medicine, take it as told by your health care provider. Do not stop taking the antibiotic even if you start to feel better. Activity  Ask your health care provider what  activities are safe for you.  Avoid activities that use your chest muscles for at least 3-4 weeks.  Do not lift anything that is heavier than 10 lb (4.5 kg), or the limit that your health care provider tells you, until he or she says that it is safe. Incision care  Follow instructions from your health care provider about how to take care of your incision(s). Make sure you: ? Wash your hands with soap and water before you change your bandage (dressing). If soap and water are not available, use hand sanitizer. ? Change your dressing as told by your health care provider. ? Leave stitches (sutures), skin glue, or adhesive strips in place. These skin closures may need to stay in place for 2 weeks or longer. If adhesive strip edges start to loosen and curl up, you may trim the loose edges. Do not remove adhesive strips completely unless your health care provider tells you to do that.  Keep your dressing dry until it has been removed.  Check your incision area every day for signs of infection. Check for: ? Redness, swelling, or pain. ? Fluid or blood. ? Warmth. ? Pus or a bad smell. Bathing  Do not take baths, swim, or use a hot tub until your health care provider approves. You may take showers.  After your dressing has been removed, use soap and water to gently wash your incision area. Do not use anything else to clean your incision(s) unless your health care provider tells you to do this. Driving   Do not drive until your health care provider approves.  Do not drive or  use heavy machinery while taking prescription pain medicine. Eating and drinking  Eat a healthy, balanced diet as instructed by your health care provider. A healthy diet includes plenty of fresh fruits and vegetables, whole grains, and low-fat (lean) proteins.  Limit foods that are high in fat and processed sugars, such as fried and sweet foods.  Drink enough fluid to keep your urine clear or pale yellow. General  instructions   To prevent or treat constipation while you are taking prescription pain medicine, your health care provider may recommend that you: ? Take over-the-counter or prescription medicines. ? Eat foods that are high in fiber, such as beans, fresh fruits and vegetables, and whole grains.  Do not use any products that contain nicotine or tobacco, such as cigarettes and e-cigarettes. If you need help quitting, ask your health care provider.  Avoid secondhand smoke.  Wear compression stockings as told by your health care provider. These stockings help to prevent blood clots and reduce swelling in your legs.  If you have a chest tube, care for it as instructed by your health care provider. Do not travel by airplane during the 2 weeks after your chest tube is removed, or until your health care provider says that this is safe.  Keep all follow-up visits as told by your health care provider. This is important. Contact a health care provider if:  You have redness, swelling, or pain around an incision.  You have fluid or blood coming from an incision.  Your incision area feels warm to the touch.  You have pus or a bad smell coming from an incision.  You have a fever or chills.  You have nausea or vomiting.  You have pain that does not get better with medicine. Get help right away if:  You have chest pain.  Your heart is fluttering or beating rapidly.  You develop a rash.  You have shortness of breath or trouble breathing.  You are confused.  You have trouble speaking.  You feel weak, light-headed, or dizzy.  You faint. Summary  To help prevent lung infection (pneumonia), take deep breaths or do breathing exercises as instructed by your health care provider.  Cough frequently to clear mucus (phlegm) and expand your lungs. If it hurts to cough, hold a pillow against your chest or place the palms of both hands on top of the incision (use splinting) when you cough.  If  you have pain, take pain-relieving medicine before your pain becomes severe. This is important because if your pain is under control, you will be able to breathe and cough more comfortably.  Ask your health care provider what activities are safe for you. This information is not intended to replace advice given to you by your health care provider. Make sure you discuss any questions you have with your health care provider. Document Revised: 02/20/2017 Document Reviewed: 02/18/2016 Elsevier Patient Education  2020 [employer].

## 2019-08-18 NOTE — Progress Notes (Deleted)
      [address]       [place] [address]             [phone]      2 Days Post-Op Procedure(s) (LRB): VIDEO BRONCHOSCOPY (N/A) XI ROBOTIC ASSISTED THORASCOPY-LEFT LOWER LOBECTOMY (Left) Node Dissection Intercostal Nerve Block (Left)   Subjective:  Patient doing well.  He did notice an increase pain this morning has his numbing medication has started to wear off.  + ambulation around the hallways independently.  Objective: Vital signs in last 24 hours: Temp:  [97.9 F (36.6 C)-98.7 F (37.1 C)] 97.9 F (36.6 C) (05/27 1119) Pulse Rate:  [63-82] 82 (05/27 0747) Cardiac Rhythm: Normal sinus rhythm (05/27 0727) Resp:  [15-22] 20 (05/27 1119) BP: (135-146)/(66-86) 141/76 (05/27 1119) SpO2:  [95 %-98 %] 95 % (05/27 0850)  Intake/Output from previous day: 05/26 0701 - 05/27 0700 In: 120 [P.O.:120] Out: 68 [Chest Tube:68]  General appearance: alert, cooperative and no distress Heart: regular rate and rhythm Lungs: clear to auscultation bilaterally Abdomen: soft, non-tender; bowel sounds normal; no masses,  no organomegaly Extremities: extremities normal, atraumatic, no cyanosis or edema Wound: clean and dry  Lab Results: Recent Labs    08/17/19 0240 08/18/19 0218  WBC 12.2* 10.9*  HGB 13.1 12.9*  HCT 38.6* 38.1*  PLT 161 143*   BMET:  Recent Labs    08/17/19 0240 08/18/19 0218  NA 137 137  K 5.1 4.4  CL 102 103  CO2 24 23  GLUCOSE 164* 180*  BUN 25* 35*  CREATININE 1.49* 1.49*  CALCIUM 9.5 9.1    PT/INR:  No results for input(s): LABPROT, INR in the last 72 hours. ABG    Component Value Date/Time   PHART 7.422 08/17/2019 0355   HCO3 23.3 08/17/2019 0355   ACIDBASEDEF 0.6 08/17/2019 0355   O2SAT 96.1 08/17/2019 0355   CBG (last 3)  Recent Labs    08/17/19 1631 08/17/19 2113 08/18/19 0626  GLUCAP 175* 183* 168*    Assessment/Plan: S/P Procedure(s) (LRB): VIDEO BRONCHOSCOPY (N/A) XI ROBOTIC ASSISTED THORASCOPY-LEFT  LOWER LOBECTOMY (Left) Node Dissection Intercostal Nerve Block (Left)  1. CV- NSR, H/O HTN- continue home Avapro, Labatelol 2. Pulm- CT with + air leak with cough, CT output 68 cc yesterday- will likely leave chest tube in place today 3. Pain control- continue Oxy, Tramadol 4. DM- sugars remain controlled 5. Dispo- patient stable, air leak persists, CXR is stable... likely leave chest tube in place today, continue current care   LOS: 2 days    Lajuana Matte, MD  08/18/2019  Chest x-ray showed an enlarged pneumothorax with chest tube. Placement nasal cannula and encourage incentive spirometry. If the pneumothorax remains present tomorrow six pigtail catheter  Ruthetta Koopmann Bary Leriche

## 2019-08-19 ENCOUNTER — Inpatient Hospital Stay (HOSPITAL_COMMUNITY): Payer: Medicare Other

## 2019-08-19 MED ORDER — TRAMADOL HCL 50 MG PO TABS: 50.0000 mg | ORAL_TABLET | Freq: Two times a day (BID) | ORAL | 0 refills | Status: DC | PRN

## 2019-08-19 NOTE — Plan of Care (Signed)

## 2019-08-19 NOTE — Progress Notes (Signed)
      [address]       [place] [address]             [phone]      3 Days Post-Op Procedure(s) (LRB): VIDEO BRONCHOSCOPY (N/A) XI ROBOTIC ASSISTED THORASCOPY-LEFT LOWER LOBECTOMY (Left) Node Dissection Intercostal Nerve Block (Left) Subjective: Feels good this morning. No issues.   Objective: Vital signs in last 24 hours: Temp:  [97.9 F (36.6 C)-98.4 F (36.9 C)] 98.1 F (36.7 C) (05/28 0733) Pulse Rate:  [70-74] 74 (05/28 0733) Cardiac Rhythm: Normal sinus rhythm (05/27 1900) Resp:  [15-20] 15 (05/28 0733) BP: (141-150)/(76-92) 150/92 (05/28 0733) SpO2:  [95 %-100 %] 100 % (05/28 0317)     Intake/Output from previous day: 05/27 0701 - 05/28 0700 In: 240 [P.O.:240] Out: -  Intake/Output this shift: Total I/O In: 240 [P.O.:240] Out: -   General appearance: alert, cooperative and no distress Heart: regular rate and rhythm, S1, S2 normal, no murmur, click, rub or gallop Lungs: clear to auscultation bilaterally Abdomen: soft, non-tender; bowel sounds normal; no masses,  no organomegaly Extremities: extremities normal, atraumatic, no cyanosis or edema Wound: clean and dry  Lab Results: Recent Labs    08/17/19 0240 08/18/19 0218  WBC 12.2* 10.9*  HGB 13.1 12.9*  HCT 38.6* 38.1*  PLT 161 143*   BMET:  Recent Labs    08/17/19 0240 08/18/19 0218  NA 137 137  K 5.1 4.4  CL 102 103  CO2 24 23  GLUCOSE 164* 180*  BUN 25* 35*  CREATININE 1.49* 1.49*  CALCIUM 9.5 9.1    PT/INR: No results for input(s): LABPROT, INR in the last 72 hours. ABG    Component Value Date/Time   PHART 7.422 08/17/2019 0355   HCO3 23.3 08/17/2019 0355   ACIDBASEDEF 0.6 08/17/2019 0355   O2SAT 96.1 08/17/2019 0355   CBG (last 3)  Recent Labs    08/17/19 1631 08/17/19 2113 08/18/19 0626  GLUCAP 175* 183* 168*    Assessment/Plan: S/P Procedure(s) (LRB): VIDEO BRONCHOSCOPY (N/A) XI ROBOTIC ASSISTED THORASCOPY-LEFT LOWER LOBECTOMY (Left) Node  Dissection Intercostal Nerve Block (Left)  1. CXR: Basilar pneumo looks about the same maybe slighly better.  2. Chest tube: 3. Remains in NSR, BP slightly elevated this morning. Continue Avapro and Labetalol 4. Blood glucose with moderate control 5. H and H stable 12.9/38.1 6. Renal function stable at 1.49, electrolytes okay   Plan: Home today. Will follow-up in the office on Friday with Dr. Kipp Brood.     LOS: 3 days    Jonathon Armstrong 08/19/2019

## 2019-08-19 NOTE — Plan of Care (Signed)

## 2019-08-19 NOTE — Progress Notes (Signed)
Pt discharged home with wife, taken to front of hospital via w/c Pt has instruction for discharge, and was able to verbalize wound care

## 2019-08-25 ENCOUNTER — Other Ambulatory Visit: Payer: Self-pay | Admitting: *Deleted

## 2019-08-25 NOTE — Patient Outreach (Signed)
[facility]) Care Management  08/25/2019  Jonathon Armstrong [facility] 1950/12/17 [id number]  EMMI-GENERAL DISCHARGED-RESOLVED DATE: 08/24/2019 DAY #1: 4 ISSUES: SAD/HOPELESS/ANXIOUS/EMPTY  OUTREACH #1: RN spoke with pt concerning the above emmi pt states the information is not true and denies any symptoms of depression with no suicidal thoughts of any kind. Pt states he is not sad/hopeless/anxious or empty. States it was the way the question way presented based upon how he answer but pt is not experiencing none of the above symptoms.  Plan: Will close  This emmi with no presented needs at this time.  Raina Mina, RN Care Management Coordinator [place] Office [phone]

## 2019-08-26 ENCOUNTER — Ambulatory Visit (INDEPENDENT_AMBULATORY_CARE_PROVIDER_SITE_OTHER): Payer: Self-pay | Admitting: Thoracic Surgery (Cardiothoracic Vascular Surgery)

## 2019-08-26 ENCOUNTER — Encounter: Payer: Self-pay | Admitting: Thoracic Surgery (Cardiothoracic Vascular Surgery)

## 2019-08-26 ENCOUNTER — Other Ambulatory Visit: Payer: Self-pay

## 2019-08-26 VITALS — BP 125/76 | HR 70 | Resp 18 | Ht 70.0 in | Wt 211.0 lb

## 2019-08-26 DIAGNOSIS — C3432 Malignant neoplasm of lower lobe, left bronchus or lung: Secondary | ICD-10-CM

## 2019-08-26 DIAGNOSIS — Z902 Acquired absence of lung [part of]: Secondary | ICD-10-CM

## 2019-08-26 NOTE — Progress Notes (Signed)
      [address]       [place] [address]             [phone]        Kalen Craig [place] [facility] Medical Record #[id number] Date of Birth: Feb 27, 1951  Referring: Garner Nash, DO Primary Care: [name], MD Primary Cardiologist:No primary care provider on file.  Reason for visit:   follow-up  History of Present Illness:     This is a 69 year old male who presents for his first follow-up appointment following a robotic assisted left lower lobectomy.  He denies any pain and has not been using his pain medication.  He denies any shortness of breath or pleuritic chest pain.  Physical Exam: BP 125/76 (BP Location: Right Arm, Patient Position: Sitting, Cuff Size: Normal)   Pulse 70   Resp 18   Ht 5\' 10"  (1.778 m)   Wt 211 lb (95.7 kg)   SpO2 96% Comment: RA  BMI 30.28 kg/m   Alert NAD Incision clean.   Abdomen soft, ND No peripheral edema   Diagnostic Studies & Laboratory data:  Path:  A. LUNG, LEFT LOWER LOBE, WEDGE RESECTION:  - Invasive adenocarcinoma, 1.5 cm.  - No visceral pleura invasion identified.  - See oncology table.   B. LYMPH NODE, HILAR #1, BIOPSY:  - One lymph node, negative for carcinoma (0/1).   C. LYMPH NODE, HILAR #2, BIOPSY:  - One lymph node, negative for carcinoma (0/1).   D. LYMPH NODE, LEVEL 7, BIOPSY:  - One lymph node, negative for carcinoma (0/1).   E. LYMPH NODE, LEVEL 9L, BIOPSY:  - One lymph node, negative for carcinoma (0/1).   F. LUNG, LEFT LOWER LOBE, LOBECTOMY:  - Lung tissue, including all final margins, negative for carcinoma.  - One lymph node, negative for carcinoma (0/1).     Assessment / Plan:   69 year old male with T1b N0 M0 adenocarcinoma of the left lower lobe.  This case was discussed in a multidisciplinary clinic and he will not require any adjuvant therapy.  I will see him back in clinic in 1 month with a chest x-ray.   Lajuana Matte 08/26/2019 1:16 PM

## 2019-09-09 ENCOUNTER — Other Ambulatory Visit: Payer: Self-pay | Admitting: Internal Medicine

## 2019-09-12 ENCOUNTER — Encounter: Payer: Self-pay | Admitting: Internal Medicine

## 2019-09-15 NOTE — Progress Notes (Signed)
Subjective:    Patient ID: Jonathon Armstrong, male    DOB: [birth year], 69 y.o.   MRN: [id number]  HPI The patient is here for an acute visit.   Intestinal issues: it started after his surgery.  He was having diarrhea QOD. He would have it 2-3 times a day.  His diet has been the same - he was eating less - just not as hungry.  He was taking probiotics and imodium.  The past three days he has been normal.  He occasional has rumbling in his intestines.  He denies any abdominal pain, blood in the stool or nausea.    His weight loss was after surgery and he feels it was related to his intestinal issues.  His appetite is still slightly decreased.    Medications and allergies reviewed with patient and updated if appropriate.  Patient Active Problem List   Diagnosis Date Noted   Primary adenocarcinoma of lower lobe of left lung ([place]) 09/16/2019   Diarrhea 09/16/2019   S/P lobectomy of lung 08/16/2019   Acute sinus infection 07/05/2019   Pain and swelling of right lower leg 02/24/2019   Chronic Eustachian tube dysfunction, right 01/18/2018   Bilateral hearing loss 01/18/2018   Cough 06/15/2015   Diabetes ([place]) 02/25/2013   Hyperlipidemia 12/05/2010   NONSPECIFIC ABNORMAL ELECTROCARDIOGRAM 08/13/2009   Hyperuricemia 02/07/2008   DIVERTICULOSIS, COLON 10/08/2007   History of colonic polyps 10/08/2007   Essential hypertension 03/26/2007   ASYSTOLE 02/22/2007   Obstructive sleep apnea 08/28/2006    Current Outpatient Medications on File Prior to Visit  Medication Sig Dispense Refill   albuterol (VENTOLIN HFA) 108 (90 Base) MCG/ACT inhaler Inhale 2 puffs into the lungs every 6 (six) hours as needed for wheezing or shortness of breath. 1 Inhaler 5   ARNUITY ELLIPTA 100 MCG/ACT AEPB INHALE 1 PUFF ONCE DAILY. (Patient taking differently: Inhale 1 puff into the lungs daily. ) 30 each 5   ezetimibe-simvastatin (VYTORIN) 10-20 MG tablet TAKE (1) TABLET DAILY AT BEDTIME. 90  tablet 0   labetalol (NORMODYNE) 200 MG tablet Take 1 tablet (200 mg total) by mouth 2 (two) times daily. T 180 tablet 3   metFORMIN (GLUCOPHAGE) 500 MG tablet TAKE 2 TABLETS TWICE DAILY WITH FOOD. (Patient taking differently: Take 1,000 mg by mouth in the morning and at bedtime. TAKE 2 TABLETS TWICE DAILY WITH FOOD.) 360 tablet 1   spironolactone (ALDACTONE) 25 MG tablet TAKE 1 TABLET ONCE DAILY. 90 tablet 1   telmisartan (MICARDIS) 80 MG tablet Take 1 tablet (80 mg total) by mouth daily. 90 tablet 3   No current facility-administered medications on file prior to visit.    Past Medical History:  Diagnosis Date   Allergy    seasonal   Asthma 01-Jun-2017   Asystole ([place])    "vagal response"post nasal polypectomy   Complication of anesthesia 06-01-04   Coded x2 after nasal polyp removal (Believes vagal nerve response)   Diabetes mellitus without complication ([place])    pre but take metformin   Diverticulosis of colon    FH: colonic polyps    hx of   Hyperlipidemia    Hypertension    Mitral regurgitation    mild & aortic Valve calcification on 2d echo;sbe prophylaxis   Parotitis, acute 06/02/11   Seasonal rhinitis    Sleep apnea    CPAP    Past Surgical History:  Procedure Laterality Date   APPENDECTOMY     asytole post nasal polypectomy  2008   colonoscopy with polypectomy  2010   Dr Olevia Perches   heriorraphy     x2   [place] Left 08/16/2019   Procedure: Intercostal Nerve Block;  Surgeon: Lajuana Matte, MD;  Location: [facility];  Service: Thoracic;  Laterality: Left;   MIDDLE EAR SURGERY     TM replacement   NASAL SINUS SURGERY  1994, [id number]   X3   NODE DISSECTION  08/16/2019   Procedure: Node Dissection;  Surgeon: Lajuana Matte, MD;  Location: [facility];  Service: Thoracic;;   TONSILLECTOMY AND ADENOIDECTOMY     VIDEO BRONCHOSCOPY N/A 08/16/2019   Procedure: VIDEO BRONCHOSCOPY;  Surgeon: Lajuana Matte, MD;   Location[facility];  Service: Thoracic;  Laterality: N/A;    Social History   Socioeconomic History   Marital status: Married    Spouse name: Not on file   Number of children: 2   Years of education: 18   Highest education level: Not on file  Occupational History   Occupation: Retired    Fish farm manager: RETIRED  Tobacco Use   Smoking status: Never Smoker   Smokeless tobacco: Never Used  Scientific laboratory technician Use: Never used  Substance and Sexual Activity   Alcohol use: Yes    Alcohol/week: 2.0 standard drinks    Types: 1 Glasses of wine, 1 Shots of liquor per week    Comment: socially < 2/ week   Drug use: No   Sexual activity: Yes  Other Topics Concern   Not on file  Social History Narrative   Fun: Water, swimming, [employer]; Volunteer work    Denies abuse and feels safe at home.   Regular exercise      Social Determinants of Health   Financial Resource Strain:    Difficulty of Paying Living Expenses:   Food Insecurity:    Worried About Charity fundraiser in the Last Year:    Arboriculturist in the Last Year:   Transportation Needs:    Film/video editor (Medical):    Lack of Transportation (Non-Medical):   Physical Activity:    Days of Exercise per Week:    Minutes of Exercise per Session:   Stress:    Feeling of Stress :   Social Connections:    Frequency of Communication with Friends and Family:    Frequency of Social Gatherings with Friends and Family:    Attends Religious Services:    Active Member of Clubs or Organizations:    Attends Music therapist:    Marital Status:     Family History  Problem Relation Age of Onset   Cancer Mother        liver &multiple myeloma   Heart disease Mother        CABG, Angina   Hyperlipidemia Mother    Hypertension Father    Heart disease Father        MI in 88"s   Dementia Father    Cancer Maternal Grandfather        bone   Other Sister        perforated bowel    Other Sister        cns aneurysm   Diabetes Neg Hx    Stroke Neg Hx    Colon cancer Neg Hx     Review of Systems  Constitutional: Negative for chills and fever.  Gastrointestinal: Positive for diarrhea (resolved). Negative for  abdominal pain, blood in stool and nausea.       Objective:   Vitals:   09/16/19 1535  BP: 130/82  Pulse: 77  Temp: 98.1 F (36.7 C)  SpO2: 97%   BP Readings from Last 3 Encounters:  09/16/19 130/82  08/26/19 125/76  08/19/19 139/81   Wt Readings from Last 3 Encounters:  09/16/19 211 lb (95.7 kg)  08/26/19 211 lb (95.7 kg)  08/16/19 222 lb (100.7 kg)   Body mass index is 30.28 kg/m.   Physical Exam Constitutional:      General: He is not in acute distress.    Appearance: Normal appearance. He is not ill-appearing.  HENT:     Head: Normocephalic and atraumatic.  Abdominal:     General: There is no distension.     Palpations: Abdomen is soft.     Tenderness: There is no abdominal tenderness. There is no guarding or rebound.     Hernia: A hernia (umbilical and hernia) is present.  Skin:    General: Skin is warm and dry.     Comments: Healing scars from lung surgery  Neurological:     Mental Status: He is alert.            Assessment & Plan:    See Problem List for Assessment and Plan of chronic medical problems.    This visit occurred during the SARS-CoV-2 public health emergency.  Safety protocols were in place, including screening questions prior to the visit, additional usage of staff PPE, and extensive cleaning of exam room while observing appropriate contact time as indicated for disinfecting solutions.

## 2019-09-16 ENCOUNTER — Encounter: Payer: Self-pay | Admitting: Internal Medicine

## 2019-09-16 ENCOUNTER — Ambulatory Visit (INDEPENDENT_AMBULATORY_CARE_PROVIDER_SITE_OTHER): Payer: Medicare Other | Admitting: Internal Medicine

## 2019-09-16 ENCOUNTER — Other Ambulatory Visit: Payer: Self-pay

## 2019-09-16 DIAGNOSIS — Z85118 Personal history of other malignant neoplasm of bronchus and lung: Secondary | ICD-10-CM | POA: Insufficient documentation

## 2019-09-16 DIAGNOSIS — C3432 Malignant neoplasm of lower lobe, left bronchus or lung: Secondary | ICD-10-CM | POA: Insufficient documentation

## 2019-09-16 DIAGNOSIS — R931 Abnormal findings on diagnostic imaging of heart and coronary circulation: Secondary | ICD-10-CM

## 2019-09-16 DIAGNOSIS — R197 Diarrhea, unspecified: Secondary | ICD-10-CM | POA: Insufficient documentation

## 2019-09-16 NOTE — Patient Instructions (Signed)
Continue the probiotics for now.

## 2019-09-16 NOTE — Assessment & Plan Note (Signed)
Acute Resolved Started after lung surgery and just resolved 3 days ago Likely related to surgery and everything associated Continue probiotics Continue regular diet Call if diarrhea recurs

## 2019-09-27 ENCOUNTER — Other Ambulatory Visit: Payer: Self-pay | Admitting: Thoracic Surgery (Cardiothoracic Vascular Surgery)

## 2019-09-27 DIAGNOSIS — Z902 Acquired absence of lung [part of]: Secondary | ICD-10-CM

## 2019-09-30 ENCOUNTER — Encounter: Payer: Self-pay | Admitting: Thoracic Surgery (Cardiothoracic Vascular Surgery)

## 2019-09-30 ENCOUNTER — Other Ambulatory Visit: Payer: Self-pay

## 2019-09-30 ENCOUNTER — Ambulatory Visit
Admission: RE | Admit: 2019-09-30 | Discharge: 2019-09-30 | Disposition: A | Discharge status: Home / Self Care | Payer: Medicare Other | Source: Ambulatory Visit | Attending: Thoracic Surgery (Cardiothoracic Vascular Surgery) | Admitting: Thoracic Surgery (Cardiothoracic Vascular Surgery)

## 2019-09-30 ENCOUNTER — Ambulatory Visit (INDEPENDENT_AMBULATORY_CARE_PROVIDER_SITE_OTHER): Payer: Self-pay | Admitting: Thoracic Surgery (Cardiothoracic Vascular Surgery)

## 2019-09-30 VITALS — BP 121/74 | HR 73 | Temp 97.3°F | Resp 20 | Ht 70.0 in | Wt 211.0 lb

## 2019-09-30 DIAGNOSIS — C3432 Malignant neoplasm of lower lobe, left bronchus or lung: Secondary | ICD-10-CM

## 2019-09-30 DIAGNOSIS — Z902 Acquired absence of lung [part of]: Secondary | ICD-10-CM

## 2019-09-30 NOTE — Progress Notes (Signed)
° °   °  [address]       [place] [address]             [phone]        Jonathon Armstrong [facility] Medical Record #[id number] Date of Birth: [birth year]  Referring: Jonathon Nash, DO Primary Care: Jonathon Rail, MD Primary Cardiologist:No primary care provider on file.  Reason for visit:   follow-up  History of Present Illness:     Jonathon Armstrong comes in for his 1 month follow-up appointment.  Overall he is done extremely well, he denies any shortness of breath or incisional pain.  He does complain of some fullness along his left costophrenic margin.  Physical Exam: BP 121/74 (BP Location: Right Arm, Patient Position: Sitting, Cuff Size: Normal)    Pulse 73    Temp (!) 97.3 F (36.3 C) (Temporal)    Resp 20    Ht 5\' 10"  (1.778 m)    Wt 211 lb (95.7 kg)    SpO2 95% Comment: RA   BMI 30.28 kg/m   Alert NAD Incision clean.   Abdomen soft, ND No peripheral edema   Diagnostic Studies & Laboratory data: CXR: Clear     Assessment / Plan:   69 year old male status post robotic assisted left lower lobectomy for a T1b N0 M0 adenocarcinoma the lung.  Overall he is doing well.  I will see him back in clinic in 6 months with a chest x-ray for his continued surveillance.   Jonathon Armstrong 09/30/2019 12:58 PM

## 2019-10-11 ENCOUNTER — Other Ambulatory Visit: Payer: Self-pay | Admitting: Pulmonary Disease

## 2019-10-27 ENCOUNTER — Telehealth: Payer: Self-pay | Admitting: *Deleted

## 2019-10-27 ENCOUNTER — Telehealth: Payer: Self-pay | Admitting: Cardiology

## 2019-10-27 NOTE — Telephone Encounter (Signed)
°  Patient received a call to schedule his follow up with [facility] but would like for Crenshaw to look over his records to see everything that has been done to him since his last visit. He wants to know if Dr Stanford Breed still wants to see him in 6 months or wait for annual. Please advise. Can reach by phone or MyChart

## 2019-10-27 NOTE — Telephone Encounter (Signed)
Spoke with pt, Aware of dr Jacalyn Lefevre recommendations. Recall placed for 04/2020 which will be one year.

## 2019-10-27 NOTE — Telephone Encounter (Signed)
A detailed message was left,re: his follow up visit. °

## 2019-10-27 NOTE — Telephone Encounter (Signed)
Annual ok if pt wishes Jonathon Armstrong

## 2019-12-08 ENCOUNTER — Other Ambulatory Visit: Payer: Self-pay | Admitting: Internal Medicine

## 2019-12-29 ENCOUNTER — Other Ambulatory Visit: Payer: Self-pay | Admitting: Internal Medicine

## 2020-01-02 NOTE — Patient Instructions (Addendum)
  Blood work was ordered.    Flu immunization administered today.      Medications reviewed and updated.  Changes include :   none     Please followup in 6 months

## 2020-01-02 NOTE — Progress Notes (Signed)
Subjective:    Patient ID: Jonathon Armstrong, male    DOB: January 12, 1951, 69 y.o.   MRN: [id number]  HPI The patient is here for follow up of their chronic medical problems, including DM, htn, hyperlipidemia  He is exercising regularly.   He walks.   He has some sensitivity in the area of his lung surgery and a bulge there.    Medications and allergies reviewed with patient and updated if appropriate.  Patient Active Problem List   Diagnosis Date Noted  . Primary adenocarcinoma of lower lobe of left lung ([place]) 09/16/2019  . Diarrhea 09/16/2019  . S/P lobectomy of lung 08/16/2019  . Acute sinus infection 07/05/2019  . Pain and swelling of right lower leg 02/24/2019  . Chronic Eustachian tube dysfunction, right 01/18/2018  . Bilateral hearing loss 01/18/2018  . Cough 06/15/2015  . Diabetes ([place]) 02/25/2013  . Hyperlipidemia 12/05/2010  . NONSPECIFIC ABNORMAL ELECTROCARDIOGRAM 08/13/2009  . Hyperuricemia 02/07/2008  . DIVERTICULOSIS, COLON 10/08/2007  . History of colonic polyps 10/08/2007  . Essential hypertension 03/26/2007  . ASYSTOLE 02/22/2007  . Obstructive sleep apnea 08/28/2006    Current Outpatient Medications on File Prior to Visit  Medication Sig Dispense Refill  . albuterol (VENTOLIN HFA) 108 (90 Base) MCG/ACT inhaler Inhale 2 puffs into the lungs every 6 (six) hours as needed for wheezing or shortness of breath. 1 Inhaler 5  . ARNUITY ELLIPTA 100 MCG/ACT AEPB INHALE 1 PUFF ONCE DAILY. 30 each 4  . ezetimibe-simvastatin (VYTORIN) 10-20 MG tablet TAKE (1) TABLET DAILY AT BEDTIME. 90 tablet 0  . labetalol (NORMODYNE) 200 MG tablet Take 1 tablet (200 mg total) by mouth 2 (two) times daily. T 180 tablet 3  . metFORMIN (GLUCOPHAGE) 500 MG tablet TAKE 2 TABLETS TWICE DAILY WITH FOOD. 120 tablet 0  . spironolactone (ALDACTONE) 25 MG tablet Take 1 tablet (25 mg total) by mouth daily. Follow-up appt is due must see provider for future refills 30 tablet 0  . telmisartan  (MICARDIS) 80 MG tablet Take 1 tablet (80 mg total) by mouth daily. 90 tablet 3   No current facility-administered medications on file prior to visit.    Past Medical History:  Diagnosis Date  . Allergy    seasonal  . Asthma 05/23/2017  . Asystole ([place])    "vagal response"post nasal polypectomy  . Complication of anesthesia 05-23-04   Coded x2 after nasal polyp removal (Believes vagal nerve response)  . Diabetes mellitus without complication ([place])    pre but take metformin  . Diverticulosis of colon   . FH: colonic polyps    hx of  . Hyperlipidemia   . Hypertension   . Mitral regurgitation    mild & aortic Valve calcification on 2d echo;sbe prophylaxis  . Parotitis, acute 05/24/11  . Seasonal rhinitis   . Sleep apnea    CPAP    Past Surgical History:  Procedure Laterality Date  . APPENDECTOMY    . asytole post nasal polypectomy  23-May-2006  . colonoscopy with polypectomy  May 23, 2008   Dr Olevia Perches  . heriorraphy     x2  . HERNIA REPAIR    . INTERCOSTAL NERVE BLOCK Left 08/16/2019   Procedure: Intercostal Nerve Block;  Surgeon: Lajuana Matte, MD;  Location: [facility];  Service: Thoracic;  Laterality: Left;  Marland Kitchen MIDDLE EAR SURGERY     TM replacement  . NASAL SINUS SURGERY  1994, [id number]   X3  . NODE DISSECTION  08/16/2019   Procedure:  Node Dissection;  Surgeon: Lajuana Matte, MD;  Location: [facility];  Service: Thoracic;;  . TONSILLECTOMY AND ADENOIDECTOMY    . VIDEO BRONCHOSCOPY N/A 08/16/2019   Procedure: VIDEO BRONCHOSCOPY;  Surgeon: Lajuana Matte, MD;  [facility];  Service: Thoracic;  Laterality: N/A;    Social History   Socioeconomic History  . Marital status: Married    Spouse name: Not on file  . Number of children: 2  . Years of education: 57  . Highest education level: Not on file  Occupational History  . Occupation: Retired    Fish farm manager: RETIRED  Tobacco Use  . Smoking status: Never Smoker  . Smokeless tobacco: Never Used  Vaping Use  . Vaping Use: Never  used  Substance and Sexual Activity  . Alcohol use: Yes    Alcohol/week: 2.0 standard drinks    Types: 1 Glasses of wine, 1 Shots of liquor per week    Comment: socially < 2/ week  . Drug use: No  . Sexual activity: Yes  Other Topics Concern  . Not on file  Social History Narrative   Fun: Water, swimming, [employer]; Psychologist, occupational work    Denies abuse and feels safe at home.   Regular exercise      Social Determinants of Health   Financial Resource Strain:   . Difficulty of Paying Living Expenses: Not on file  Food Insecurity:   . Worried About Charity fundraiser in the Last Year: Not on file  . Ran Out of Food in the Last Year: Not on file  Transportation Needs:   . Lack of Transportation (Medical): Not on file  . Lack of Transportation (Non-Medical): Not on file  Physical Activity:   . Days of Exercise per Week: Not on file  . Minutes of Exercise per Session: Not on file  Stress:   . Feeling of Stress : Not on file  Social Connections:   . Frequency of Communication with Friends and Family: Not on file  . Frequency of Social Gatherings with Friends and Family: Not on file  . Attends Religious Services: Not on file  . Active Member of Clubs or Organizations: Not on file  . Attends Archivist Meetings: Not on file  . Marital Status: Not on file    Family History  Problem Relation Age of Onset  . Cancer Mother        liver &multiple myeloma  . Heart disease Mother        CABG, Angina  . Hyperlipidemia Mother   . Hypertension Father   . Heart disease Father        MI in 21"s  . Dementia Father   . Cancer Maternal Grandfather        bone  . Other Sister        perforated bowel  . Other Sister        cns aneurysm  . Diabetes Neg Hx   . Stroke Neg Hx   . Colon cancer Neg Hx     Review of Systems  Constitutional: Negative for chills and fever.  Respiratory: Negative for cough, shortness of breath and wheezing.   Cardiovascular: Negative for chest  pain, palpitations and leg swelling.  Neurological: Negative for light-headedness and headaches.       Objective:   Vitals:   01/03/20 0847  BP: 120/78  Pulse: 76  Temp: 98 F (36.7 C)  SpO2: 97%   BP Readings from Last 3 Encounters:  01/03/20 120/78  09/30/19 121/74  09/16/19 130/82   Wt Readings from Last 3 Encounters:  01/03/20 213 lb (96.6 kg)  09/30/19 211 lb (95.7 kg)  09/16/19 211 lb (95.7 kg)   Body mass index is 30.56 kg/m.   Physical Exam    Constitutional: Appears well-developed and well-nourished. No distress.  HENT:  Head: Normocephalic and atraumatic.  Neck: Neck supple. No tracheal deviation present. No thyromegaly present.  No cervical lymphadenopathy Cardiovascular: Normal rate, regular rhythm and normal heart sounds.   No murmur heard. No carotid bruit .  No edema Pulmonary/Chest: slight bulge near scars from lung surgery - no soreness, no fluctuance, redness, bruising, Effort normal and breath sounds normal. No respiratory distress. No has no wheezes. No rales.  Skin: Skin is warm and dry. Not diaphoretic.  Psychiatric: Normal mood and affect. Behavior is normal.      Assessment & Plan:    Due for an eye exam  - he will schedule  See Problem List for Assessment and Plan of chronic medical problems.    This visit occurred during the SARS-CoV-2 public health emergency.  Safety protocols were in place, including screening questions prior to the visit, additional usage of staff PPE, and extensive cleaning of exam room while observing appropriate contact time as indicated for disinfecting solutions.

## 2020-01-03 ENCOUNTER — Ambulatory Visit (INDEPENDENT_AMBULATORY_CARE_PROVIDER_SITE_OTHER): Payer: Medicare Other | Admitting: Internal Medicine

## 2020-01-03 ENCOUNTER — Other Ambulatory Visit: Payer: Self-pay

## 2020-01-03 ENCOUNTER — Encounter: Payer: Self-pay | Admitting: Internal Medicine

## 2020-01-03 VITALS — BP 120/78 | HR 76 | Temp 98.0°F | Ht 70.0 in | Wt 213.0 lb

## 2020-01-03 DIAGNOSIS — I10 Essential (primary) hypertension: Secondary | ICD-10-CM | POA: Diagnosis not present

## 2020-01-03 DIAGNOSIS — E119 Type 2 diabetes mellitus without complications: Secondary | ICD-10-CM

## 2020-01-03 DIAGNOSIS — Z23 Encounter for immunization: Secondary | ICD-10-CM

## 2020-01-03 DIAGNOSIS — R931 Abnormal findings on diagnostic imaging of heart and coronary circulation: Secondary | ICD-10-CM

## 2020-01-03 DIAGNOSIS — E782 Mixed hyperlipidemia: Secondary | ICD-10-CM

## 2020-01-03 LAB — COMPREHENSIVE METABOLIC PANEL
ALT: 16 U/L (ref 0–53)
AST: 15 U/L (ref 0–37)
Albumin: 4.7 g/dL (ref 3.5–5.2)
Alkaline Phosphatase: 70 U/L (ref 39–117)
BUN: 20 mg/dL (ref 6–23)
CO2: 26 mEq/L (ref 19–32)
Calcium: 10.2 mg/dL (ref 8.4–10.5)
Chloride: 104 mEq/L (ref 96–112)
Creatinine, Ser: 1.17 mg/dL (ref 0.40–1.50)
GFR: 62.6 mL/min (ref >60.00)
Glucose, Bld: 137 mg/dL — ABNORMAL HIGH (ref 70–99)
Potassium: 4.3 mEq/L (ref 3.5–5.1)
Sodium: 138 mEq/L (ref 135–145)
Total Bilirubin: 0.5 mg/dL (ref 0.2–1.2)
Total Protein: 7.2 g/dL (ref 6.0–8.3)

## 2020-01-03 LAB — CBC WITH DIFFERENTIAL/PLATELET
Basophils Absolute: 0 10*3/uL (ref 0.0–0.1)
Basophils Relative: 0.1 % (ref 0.0–3.0)
Eosinophils Absolute: 0.1 10*3/uL (ref 0.0–0.7)
Eosinophils Relative: 1.5 % (ref 0.0–5.0)
HCT: 37.2 % — ABNORMAL LOW (ref 39.0–52.0)
Hemoglobin: 12.9 g/dL — ABNORMAL LOW (ref 13.0–17.0)
Lymphocytes Relative: 26.2 % (ref 12.0–46.0)
Lymphs Abs: 1.7 10*3/uL (ref 0.7–4.0)
MCHC: 34.8 g/dL (ref 30.0–36.0)
MCV: 89.3 fl (ref 78.0–100.0)
Monocytes Absolute: 0.5 10*3/uL (ref 0.1–1.0)
Monocytes Relative: 7.7 % (ref 3.0–12.0)
Neutro Abs: 4.3 10*3/uL (ref 1.4–7.7)
Neutrophils Relative %: 64.5 % (ref 43.0–77.0)
Platelets: 160 10*3/uL (ref 150.0–400.0)
RBC: 4.17 Mil/uL — ABNORMAL LOW (ref 4.22–5.81)
RDW: 13.6 % (ref 11.5–15.5)
WBC: 6.6 10*3/uL (ref 4.0–10.5)

## 2020-01-03 LAB — LIPID PANEL
Cholesterol: 148 mg/dL (ref 0–200)
HDL: 36.9 mg/dL — ABNORMAL LOW (ref >39.00)
NonHDL: 110.81
Total CHOL/HDL Ratio: 4
Triglycerides: 288 mg/dL — ABNORMAL HIGH (ref 0.0–149.0)
VLDL: 57.6 mg/dL — ABNORMAL HIGH (ref 0.0–40.0)

## 2020-01-03 LAB — LDL CHOLESTEROL, DIRECT: Direct LDL: 76 mg/dL

## 2020-01-03 LAB — HEMOGLOBIN A1C: Hgb A1c MFr Bld: 6.8 % — ABNORMAL HIGH (ref 4.6–6.5)

## 2020-01-03 NOTE — Assessment & Plan Note (Signed)
Chronic Check lipid panel  Continue vytorin 10-20 mg daily Regular exercise and healthy diet encouraged

## 2020-01-03 NOTE — Assessment & Plan Note (Signed)
Chronic BP well controlled Continue labetalol 200 mg BID, aldactone 25 mg daily and micardis 80 mg daily cmp

## 2020-01-03 NOTE — Assessment & Plan Note (Signed)
Chronic Check a1c Low sugar / carb diet Continue regular exercise Continue metformin 1000 mg BID

## 2020-01-03 NOTE — Addendum Note (Signed)
Addended by: Marcina Millard on: 01/03/2020 02:15 PM   Modules accepted: Orders

## 2020-01-04 DIAGNOSIS — E119 Type 2 diabetes mellitus without complications: Secondary | ICD-10-CM | POA: Diagnosis not present

## 2020-01-04 LAB — HM DIABETES EYE EXAM

## 2020-01-12 ENCOUNTER — Encounter: Payer: Self-pay | Admitting: Internal Medicine

## 2020-01-12 NOTE — Progress Notes (Signed)
Outside notes received. Information abstracted. Notes sent to scan.  

## 2020-01-17 ENCOUNTER — Other Ambulatory Visit: Payer: Self-pay | Admitting: Internal Medicine

## 2020-01-23 ENCOUNTER — Other Ambulatory Visit: Payer: Self-pay | Admitting: Internal Medicine

## 2020-01-24 ENCOUNTER — Encounter: Payer: Self-pay | Admitting: Internal Medicine

## 2020-02-04 ENCOUNTER — Other Ambulatory Visit: Payer: Self-pay

## 2020-02-04 ENCOUNTER — Ambulatory Visit: Payer: Medicare Other | Attending: Internal Medicine

## 2020-02-04 DIAGNOSIS — Z23 Encounter for immunization: Secondary | ICD-10-CM

## 2020-02-04 NOTE — Progress Notes (Signed)
   Covid-19 Vaccination Clinic  Name:  Jonathon Armstrong    MRN: [id number] DOB: 1950/12/24  02/04/2020  Jonathon Armstrong was observed post Covid-19 immunization for 15 minutes without incident. He was provided with Vaccine Information Sheet and instruction to access the V-Safe system.   Jonathon Armstrong was instructed to call 911 with any severe reactions post vaccine: Marland Kitchen Difficulty breathing  . Swelling of face and throat  . A fast heartbeat  . A bad rash all over body  . Dizziness and weakness   Immunizations Administered    Name Date Dose VIS Date Route   Pfizer COVID-19 Vaccine 02/04/2020 11:08 AM 0.3 mL 01/11/2020 Intramuscular   Manufacturer: [place]   Lot: [id number]   [place]: [address]-1

## 2020-02-14 ENCOUNTER — Other Ambulatory Visit: Payer: Self-pay | Admitting: Internal Medicine

## 2020-03-05 DIAGNOSIS — L820 Inflamed seborrheic keratosis: Secondary | ICD-10-CM | POA: Diagnosis not present

## 2020-03-05 DIAGNOSIS — L821 Other seborrheic keratosis: Secondary | ICD-10-CM | POA: Diagnosis not present

## 2020-03-05 DIAGNOSIS — D1801 Hemangioma of skin and subcutaneous tissue: Secondary | ICD-10-CM | POA: Diagnosis not present

## 2020-03-05 DIAGNOSIS — D485 Neoplasm of uncertain behavior of skin: Secondary | ICD-10-CM | POA: Diagnosis not present

## 2020-03-05 DIAGNOSIS — L812 Freckles: Secondary | ICD-10-CM | POA: Diagnosis not present

## 2020-03-05 DIAGNOSIS — Z85828 Personal history of other malignant neoplasm of skin: Secondary | ICD-10-CM | POA: Diagnosis not present

## 2020-03-05 DIAGNOSIS — L570 Actinic keratosis: Secondary | ICD-10-CM | POA: Diagnosis not present

## 2020-03-20 ENCOUNTER — Other Ambulatory Visit: Payer: Self-pay | Admitting: Internal Medicine

## 2020-04-06 ENCOUNTER — Other Ambulatory Visit: Payer: Self-pay | Admitting: Internal Medicine

## 2020-04-06 ENCOUNTER — Ambulatory Visit: Payer: Medicare Other | Admitting: Thoracic Surgery (Cardiothoracic Vascular Surgery)

## 2020-04-12 ENCOUNTER — Other Ambulatory Visit: Payer: Self-pay | Admitting: Thoracic Surgery (Cardiothoracic Vascular Surgery)

## 2020-04-12 DIAGNOSIS — C3432 Malignant neoplasm of lower lobe, left bronchus or lung: Secondary | ICD-10-CM

## 2020-04-13 ENCOUNTER — Encounter: Payer: Self-pay | Admitting: Thoracic Surgery (Cardiothoracic Vascular Surgery)

## 2020-04-13 ENCOUNTER — Other Ambulatory Visit: Payer: Self-pay

## 2020-04-13 ENCOUNTER — Ambulatory Visit (INDEPENDENT_AMBULATORY_CARE_PROVIDER_SITE_OTHER): Payer: Medicare Other | Admitting: Thoracic Surgery (Cardiothoracic Vascular Surgery)

## 2020-04-13 ENCOUNTER — Ambulatory Visit
Admission: RE | Admit: 2020-04-13 | Discharge: 2020-04-13 | Disposition: A | Discharge status: Home / Self Care | Payer: Medicare Other | Source: Ambulatory Visit | Attending: Thoracic Surgery (Cardiothoracic Vascular Surgery) | Admitting: Thoracic Surgery (Cardiothoracic Vascular Surgery)

## 2020-04-13 VITALS — BP 121/75 | HR 80 | Temp 97.6°F | Resp 20 | Ht 70.0 in | Wt 214.0 lb

## 2020-04-13 DIAGNOSIS — C3432 Malignant neoplasm of lower lobe, left bronchus or lung: Secondary | ICD-10-CM

## 2020-04-13 DIAGNOSIS — Z902 Acquired absence of lung [part of]: Secondary | ICD-10-CM

## 2020-04-13 DIAGNOSIS — J9811 Atelectasis: Secondary | ICD-10-CM | POA: Diagnosis not present

## 2020-04-13 NOTE — Progress Notes (Signed)
[address]       [place] [address]             [phone]                    Jonathon Armstrong Jonathon Armstrong #[id number] Date of Birth: 02-21-1951  Referring: Jonathon Nash, DO Primary Care: Jonathon Rail, MD Primary Cardiologist: No primary care provider on file.  Chief Complaint:    Chief Complaint  Patient presents with  . Lung Cancer    6 month f/u with CXR HX of Lobectomy    History of Present Illness:    Jonathon Armstrong 70 y.o. male presents for his 58-monthfollow-up chest x-ray.  On 08/16/2019 he underwent a robotic assisted left lower lobectomy for T1b N0 M0 adenocarcinoma.  He is done exceedingly well since his operation.  He had a very quiet Christmas.  Unfortunately his daughter did contract COVID but she is doing well now.  He remains very active with his church and his men's cancer support group.    Past Medical History:  Diagnosis Date  . Allergy    seasonal  . Asthma 2Feb 23, 2019 . [place]    "vagal response"post nasal polypectomy  . Complication of anesthesia 202/23/06  Coded x2 after nasal polyp removal (Believes vagal nerve response)  . Diabetes mellitus without complication ([facility]    pre but take metformin  . Diverticulosis of colon   . FH: colonic polyps    hx of  . Hyperlipidemia   . Hypertension   . Mitral regurgitation    mild & aortic Valve calcification on 2d echo;sbe prophylaxis  . Parotitis, acute 202-23-2013 . Seasonal rhinitis   . Sleep apnea    CPAP    Past Surgical History:  Procedure Laterality Date  . APPENDECTOMY    . asytole post nasal polypectomy  223-Feb-2008 . colonoscopy with polypectomy  202/23/10  Dr BOlevia Perches . heriorraphy     x2  . HERNIA REPAIR    . INTERCOSTAL NERVE BLOCK Left 08/16/2019   Procedure: Intercostal Nerve Block;  Surgeon: LLajuana Matte MD;  [facility]  Service: Thoracic;  Laterality: Left;  .Marland KitchenMIDDLE EAR SURGERY     TM replacement  . NASAL SINUS SURGERY  1994,  [id number]  X3  . NODE DISSECTION  08/16/2019   Procedure: Node Dissection;  Surgeon: LLajuana Matte MD;  [facility]  Service: Thoracic;;  . TONSILLECTOMY AND ADENOIDECTOMY    . VIDEO BRONCHOSCOPY N/A 08/16/2019   Procedure: VIDEO BRONCHOSCOPY;  Surgeon: LLajuana Matte MD;  [facility];  Service: Thoracic;  Laterality: N/A;    Family History  Problem Relation Age of Onset  . Cancer Mother        liver &multiple myeloma  . Heart disease Mother        CABG, Angina  . Hyperlipidemia Mother   . Hypertension Father   . Heart disease Father        MI in 867"s . Dementia Father   . Cancer Maternal Grandfather        bone  . Other Sister        perforated bowel  . Other Sister        cns aneurysm  . Diabetes Neg Hx   . Stroke Neg Hx   . Colon cancer Neg Hx  Social History   Tobacco Use  Smoking Status Never Smoker  Smokeless Tobacco Never Used    Social History   Substance and Sexual Activity  Alcohol Use Yes  . Alcohol/week: 2.0 standard drinks  . Types: 1 Glasses of wine, 1 Shots of liquor per week   Comment: socially < 2/ week     Allergies  Allergen Reactions  . Diltiazem Hcl Other (See Comments) and Cough    ? symptoms  . Penicillins     Rash with Amoxicillin with Mono in college    Current Outpatient Medications  Medication Sig Dispense Refill  . albuterol (VENTOLIN HFA) 108 (90 Base) MCG/ACT inhaler Inhale 2 puffs into the lungs every 6 (six) hours as needed for wheezing or shortness of breath. 1 Inhaler 5  . ARNUITY ELLIPTA 100 MCG/ACT AEPB INHALE 1 PUFF ONCE DAILY. 30 each 4  . ezetimibe-simvastatin (VYTORIN) 10-20 MG tablet TAKE (1) TABLET DAILY AT BEDTIME. 90 tablet 0  . labetalol (NORMODYNE) 200 MG tablet Take 1 tablet (200 mg total) by mouth 2 (two) times daily. T 180 tablet 3  . metFORMIN (GLUCOPHAGE) 500 MG tablet TAKE 2 TABLETS TWICE DAILY WITH FOOD. 120 tablet 0  . spironolactone (ALDACTONE) 25 MG tablet TAKE 1 TABLET  ONCE DAILY. 90 tablet 1  . telmisartan (MICARDIS) 80 MG tablet Take 1 tablet (80 mg total) by mouth daily. 90 tablet 3   No current facility-administered medications for this visit.    Review of Systems  All other systems reviewed and are negative.   PHYSICAL EXAMINATION: BP 121/75   Pulse 80   Temp 97.6 F (36.4 C) (Skin)   Resp 20   Ht _0  (1.778 m)   Wt 214 lb (97.1 kg)   SpO2 95% Comment: RA  BMI 30.71 kg/m   Physical Exam Constitutional:      General: He is not in acute distress.    Appearance: Normal appearance. He is normal weight. He is not ill-appearing.  Eyes:     Extraocular Movements: Extraocular movements intact.  Cardiovascular:     Rate and Rhythm: Normal rate.  Pulmonary:     Effort: Pulmonary effort is normal. No respiratory distress.  Abdominal:     General: Abdomen is flat.  Musculoskeletal:        General: Normal range of motion.     Cervical back: Normal range of motion.  Skin:    General: Skin is warm and dry.  Neurological:     General: No focal deficit present.     Mental Status: He is alert and oriented to person, place, and time.      Diagnostic Studies & Laboratory data:     Recent Radiology Findings:   DG Chest 2 View  Result Date: 04/13/2020 CLINICAL DATA:  Previous left lower lobectomy for lung carcinoma EXAM: CHEST - 2 VIEW COMPARISON:  September 30, 2019 FINDINGS: There is slight atelectasis in the right base. Lungs elsewhere are clear. Heart is upper normal in size with pulmonary vascular normal. No adenopathy. No bone lesions. IMPRESSION: Mild right base atelectasis. No edema or airspace opacity. No parenchymal lung nodule/mass lesion. Heart upper normal in size.  No evident adenopathy. Electronically Signed   By: Lowella Grip III M.D.   On: 04/13/2020 09:26       I have independently reviewed the above radiology studies  and reviewed the findings with the patient.   Recent Lab Findings: Lab Results  Component Value Date    WBC  6.6 01/03/2020   HGB 12.9 (L) 01/03/2020   HCT 37.2 (L) 01/03/2020   PLT 160.0 01/03/2020   GLUCOSE 137 (H) 01/03/2020   CHOL 148 01/03/2020   TRIG 288.0 (H) 01/03/2020   HDL 36.90 (L) 01/03/2020   LDLDIRECT 76.0 01/03/2020   LDLCALC 54 09/18/2014   ALT 16 01/03/2020   AST 15 01/03/2020   NA 138 01/03/2020   K 4.3 01/03/2020   CL 104 01/03/2020   CREATININE 1.17 01/03/2020   BUN 20 01/03/2020   CO2 26 01/03/2020   TSH 1.41 11/12/2018   INR 1.0 08/15/2019   HGBA1C 6.8 (H) 01/03/2020        Assessment / Plan:   70 year old male status post robotic assisted left lower lobectomy for a T1b N0 M0 adenocarcinoma the lung on 08/16/2019.Marland Kitchen  He continues to do quite well and has had no issues postoperatively.  His chest x-ray did not show any fluid collections or obvious pulmonary nodules.  He is scheduled to return to clinic in 6 months with a CT chest for continued surveillance.      Lajuana Matte 04/13/2020 2:20 PM

## 2020-04-18 ENCOUNTER — Other Ambulatory Visit: Payer: Self-pay | Admitting: Internal Medicine

## 2020-04-18 ENCOUNTER — Other Ambulatory Visit: Payer: Self-pay | Admitting: Pulmonary Disease

## 2020-04-27 ENCOUNTER — Telehealth: Payer: Self-pay | Admitting: Internal Medicine

## 2020-04-27 NOTE — Progress Notes (Signed)
  Chronic Care Management   Outreach Note  04/27/2020 Name: Jonathon Armstrong MRN: [id number] DOB: 19-Sep-1950  Referred by: [name], MD Reason for referral : No chief complaint on file.   An unsuccessful telephone outreach was attempted today. The patient was referred to the pharmacist for assistance with care management and care coordination.   Follow Up Plan:    Chronic Care Management   Outreach Note  04/27/2020 Name: Jonathon Armstrong MRN: [id number] DOB: [birth year]  Referred by: [name], MD Reason for referral : No chief complaint on file.   An unsuccessful telephone outreach was attempted today. The patient was referred to the pharmacist for assistance with care management and care coordination.   Follow Up Plan:   Carley Perdue [employer] Scheduler

## 2020-05-01 ENCOUNTER — Telehealth: Payer: Self-pay | Admitting: Internal Medicine

## 2020-05-01 NOTE — Progress Notes (Signed)
  Chronic Care Management   Outreach Note  05/01/2020 Name: Jonathon Armstrong MRN: [id number] DOB: 01-12-1951  Referred by: [name], MD Reason for referral : No chief complaint on file.   A second unsuccessful telephone outreach was attempted today. The patient was referred to pharmacist for assistance with care management and care coordination.  Follow Up Plan:   Carley Perdue [employer] Scheduler

## 2020-05-03 ENCOUNTER — Telehealth: Payer: Self-pay | Admitting: Internal Medicine

## 2020-05-03 NOTE — Progress Notes (Signed)
  Chronic Care Management   Outreach Note  05/03/2020 Name: Jonathon Armstrong MRN: [id number] DOB: 1951/02/04  Referred by: [name], MD Reason for referral : No chief complaint on file.   Third unsuccessful telephone outreach was attempted today. The patient was referred to the pharmacist for assistance with care management and care coordination.   Follow Up Plan:   Carley Perdue [employer] Scheduler

## 2020-05-09 ENCOUNTER — Telehealth: Payer: Self-pay | Admitting: Internal Medicine

## 2020-05-09 NOTE — Progress Notes (Signed)
  Chronic Care Management   Note  05/09/2020 Name: Jonathon Armstrong MRN: [id number] DOB: Nov 30, 1950  Jonathon Armstrong is a 70 y.o. year old male who is a primary care patient of Burns, Claudina Lick, MD. I reached out to [place] by phone today in response to a referral sent by Jonathon Armstrong's PCP, [name], MD.   Jonathon Armstrong was given information about Chronic Care Management services today including:  1. CCM service includes personalized support from designated clinical staff supervised by his physician, including individualized plan of care and coordination with other care providers 2. 24/7 contact phone numbers for assistance for urgent and routine care needs. 3. Service will only be billed when office clinical staff spend 20 minutes or more in a month to coordinate care. 4. Only one practitioner may furnish and bill the service in a calendar month. 5. The patient may stop CCM services at any time (effective at the end of the month) by phone call to the office staff.   Patient agreed to services and verbal consent obtained.   Follow up plan:   Carley Perdue [employer] Scheduler

## 2020-05-16 ENCOUNTER — Telehealth: Payer: Self-pay | Admitting: Internal Medicine

## 2020-05-16 NOTE — Telephone Encounter (Signed)
LVM for pt to rtn my call to schedule awv with nha.. Please schedule AWV if pt calls the office.

## 2020-05-24 ENCOUNTER — Other Ambulatory Visit: Payer: Self-pay | Admitting: Internal Medicine

## 2020-05-28 ENCOUNTER — Ambulatory Visit (INDEPENDENT_AMBULATORY_CARE_PROVIDER_SITE_OTHER): Payer: Medicare Other

## 2020-05-28 ENCOUNTER — Other Ambulatory Visit: Payer: Self-pay

## 2020-05-28 VITALS — BP 136/80 | HR 71 | Temp 98.0°F | Ht 70.0 in | Wt 216.8 lb

## 2020-05-28 DIAGNOSIS — Z0000 Encounter for general adult medical examination without abnormal findings: Secondary | ICD-10-CM

## 2020-05-28 NOTE — Progress Notes (Signed)
Subjective:   Azure Mirante is a 70 y.o. male who presents for Medicare Annual/Subsequent preventive examination.  Review of Systems    No ROS. Medicare Wellness Visit. Additional risk factors are reflected in social history. Cardiac Risk Factors include: advanced age (>31men, >41 women);dyslipidemia;family history of premature cardiovascular disease;hypertension;male gender Sleep Patterns: No sleep issues, feels rested on waking and sleeps 7-8 hours nightly. Home Safety/Smoke Alarms: Feels safe in home; uses home alarm. Smoke alarms in place. Living environment: 2-story home; Lives with spouse; no needs for DME; good support system. Seat Belt Safety/Bike Helmet: Wears seat belt.    Objective:    Today's Vitals   05/28/20 1020  BP: 136/80  Pulse: 71  Temp: 98 F (36.7 C)  SpO2: 97%  Weight: 216 lb 12.8 oz (98.3 kg)  Height: 5\' 10"  (1.778 m)   Body mass index is 31.11 kg/m.  Advanced Directives 05/28/2020 08/18/2019 08/16/2019 08/15/2019 03/02/2018 09/29/2014  Does Patient Have a Medical Advance Directive? Yes Yes Yes Yes Yes Yes  Type of Advance Directive Living will;Healthcare Power of Radio producer Power of [employer] Power of [employer] Power of [place];Living will -  Does patient want to make changes to medical advance directive? No - Patient declined No - Patient declined - No - Patient declined - -  Copy of Healthcare Power of Attorney in Chart? No - copy requested No - copy requested - No - copy requested No - copy requested -    Current Medications (verified) Outpatient Encounter Medications as of 05/28/2020  Medication Sig  . albuterol (VENTOLIN HFA) 108 (90 Base) MCG/ACT inhaler Inhale 2 puffs into the lungs every 6 (six) hours as needed for wheezing or shortness of breath.  . ARNUITY ELLIPTA 100 MCG/ACT AEPB INHALE 1 PUFF ONCE DAILY.  Marland Kitchen ezetimibe-simvastatin (VYTORIN) 10-20 MG tablet TAKE (1) TABLET DAILY AT  BEDTIME.  Marland Kitchen labetalol (NORMODYNE) 200 MG tablet Take 1 tablet (200 mg total) by mouth 2 (two) times daily. T  . metFORMIN (GLUCOPHAGE) 500 MG tablet TAKE TWO TABLETS BY MOUTH TWICE DAILY WITH FOOD  . spironolactone (ALDACTONE) 25 MG tablet TAKE 1 TABLET ONCE DAILY.  Marland Kitchen telmisartan (MICARDIS) 80 MG tablet Take 1 tablet (80 mg total) by mouth daily.   No facility-administered encounter medications on file as of 05/28/2020.    Allergies (verified) Diltiazem hcl and Penicillins   History: Past Medical History:  Diagnosis Date  . Allergy    seasonal  . Asthma Apr 23, 2017  . Asystole ([facility])    "vagal response"post nasal polypectomy  . Complication of anesthesia 04-23-04   Coded x2 after nasal polyp removal (Believes vagal nerve response)  . Diabetes mellitus without complication ([facility])    pre but take metformin  . Diverticulosis of colon   . FH: colonic polyps    hx of  . Hyperlipidemia   . Hypertension   . Mitral regurgitation    mild & aortic Valve calcification on 2d echo;sbe prophylaxis  . Parotitis, acute Apr 24, 2011  . Seasonal rhinitis   . Sleep apnea    CPAP   Past Surgical History:  Procedure Laterality Date  . APPENDECTOMY    . asytole post nasal polypectomy  04-23-06  . colonoscopy with polypectomy  04/23/08   Dr Juanda Chance  . heriorraphy     x2  . HERNIA REPAIR    . INTERCOSTAL NERVE BLOCK Left 08/16/2019   Procedure: Intercostal Nerve Block;  Surgeon: Corliss Skains, MD;  [facility];  Service: Thoracic;  Laterality: Left;  Marland Kitchen MIDDLE EAR SURGERY     TM replacement  . NASAL SINUS SURGERY  1994, [id number]   X3  . NODE DISSECTION  08/16/2019   Procedure: Node Dissection;  Surgeon: Corliss Skains, MD;  Location[facility];  Service: Thoracic;;  . TONSILLECTOMY AND ADENOIDECTOMY    . VIDEO BRONCHOSCOPY N/A 08/16/2019   Procedure: VIDEO BRONCHOSCOPY;  Surgeon: Corliss Skains, MD;  [facility];  Service: Thoracic;  Laterality: N/A;   Family History  Problem Relation Age of  Onset  . Cancer Mother        liver &multiple myeloma  . Heart disease Mother        CABG, Angina  . Hyperlipidemia Mother   . Hypertension Father   . Heart disease Father        MI in 64"s  . Dementia Father   . Cancer Maternal Grandfather        bone  . Other Sister        perforated bowel  . Other Sister        cns aneurysm  . Diabetes Neg Hx   . Stroke Neg Hx   . Colon cancer Neg Hx    Social History   Socioeconomic History  . Marital status: Married    Spouse name: Not on file  . Number of children: 2  . Years of education: 54  . Highest education level: Not on file  Occupational History  . Occupation: Retired    Associate Professor: RETIRED  Tobacco Use  . Smoking status: Never Smoker  . Smokeless tobacco: Never Used  Vaping Use  . Vaping Use: Never used  Substance and Sexual Activity  . Alcohol use: Yes    Alcohol/week: 2.0 standard drinks    Types: 1 Glasses of wine, 1 Shots of liquor per week    Comment: socially < 2/ week  . Drug use: No  . Sexual activity: Yes  Other Topics Concern  . Not on file  Social History Narrative   Fun: Water, swimming, [employer]; Agricultural consultant work    Denies abuse and feels safe at home.   Regular exercise      Social Determinants of Health   Financial Resource Strain: Low Risk   . Difficulty of Paying Living Expenses: Not hard at all  Food Insecurity: No Food Insecurity  . Worried About Programme researcher, broadcasting/film/video in the Last Year: Never true  . Ran Out of Food in the Last Year: Never true  Transportation Needs: No Transportation Needs  . Lack of Transportation (Medical): No  . Lack of Transportation (Non-Medical): No  Physical Activity: Sufficiently Active  . Days of Exercise per Week: 5 days  . Minutes of Exercise per Session: 30 min  Stress: No Stress Concern Present  . Feeling of Stress : Not at all  Social Connections: Socially Integrated  . Frequency of Communication with Friends and Family: More than three times a week  .  Frequency of Social Gatherings with Friends and Family: More than three times a week  . Attends Religious Services: More than 4 times per year  . Active Member of Clubs or Organizations: Yes  . Attends Banker Meetings: More than 4 times per year  . Marital Status: Married    Tobacco Counseling Counseling given: Not Answered   Clinical Intake:  Pre-visit preparation completed: Yes  Pain : No/denies pain     BMI - recorded:  31.11 Nutritional Status: BMI > 30  Obese Nutritional Risks: None Diabetes: No  How often do you need to have someone help you when you read instructions, pamphlets, or other written materials from your doctor or pharmacy?: 1 - Never What is the last grade level you completed in school?: Master's Degree  Diabetic? no  Interpreter Needed?: No  Information entered by :: Susie Cassette, LPN   Activities of Daily Living In your present state of health, do you have any difficulty performing the following activities: 05/28/2020 08/18/2019  Hearing? N N  Vision? N N  Difficulty concentrating or making decisions? N N  Walking or climbing stairs? N N  Dressing or bathing? N N  Doing errands, shopping? - Engineering geologist and eating ? N -  Using the Toilet? N -  In the past six months, have you accidently leaked urine? N -  Do you have problems with loss of bowel control? N -  Managing your Medications? N -  Managing your Finances? N -  Housekeeping or managing your Housekeeping? N -  Some recent data might be hidden    Patient Care Team: Pincus Sanes, MD as PCP - General (Internal Medicine) Kathyrn Sheriff, [facility] as Pharmacist (Pharmacist) [place], Heather, OD as Consulting Physician (Optometry)  Indicate any recent Medical Services you may have received from other than [facility] providers in the past year (date may be approximate).     Assessment:   This is a routine wellness examination for Rosie.  Hearing/Vision screen No exam  data present  Dietary issues and exercise activities discussed: Current Exercise Habits: Home exercise routine, Type of exercise: walking, Time (Minutes): 30, Frequency (Times/Week): 5, Weekly Exercise (Minutes/Week): 150, Intensity: Moderate, Exercise limited by: cardiac condition(s)  Goals    . Patient Stated     Continue volunteer within the community. Enjoy life, love family and stay emotionally engaged with myself and others.      Depression Screen PHQ 2/9 Scores 05/28/2020 05/17/2019 03/02/2018 03/02/2018 01/28/2018 01/18/2018 12/25/2016  PHQ - 2 Score 0 0 0 0 0 0 0  PHQ- 9 Score - - - - - 0 -    Fall Risk Fall Risk  05/28/2020 05/17/2019 02/16/2019 03/02/2018 03/02/2018  Falls in the past year? 0 0 0 1 0  Comment - - Emmi Telephone Survey: data to providers prior to load - -  Number falls in past yr: 0 0 - 0 -  Injury with Fall? 0 0 - 1 -  Risk for fall due to : No Fall Risks - - - -  Follow up Falls evaluation completed - - - -    FALL RISK PREVENTION PERTAINING TO THE HOME:  Any stairs in or around the home? Yes  If so, are there any without handrails? No  Home free of loose throw rugs in walkways, pet beds, electrical cords, etc? Yes  Adequate lighting in your home to reduce risk of falls? Yes   ASSISTIVE DEVICES UTILIZED TO PREVENT FALLS:  Life alert? No  Use of a cane, walker or w/c? No  Grab bars in the bathroom? Yes  Shower chair or bench in shower? Yes  Elevated toilet seat or a handicapped toilet? No   TIMED UP AND GO:  Was the test performed? No .  Length of time to ambulate 10 feet: 0 sec.   Gait steady and fast without use of assistive device  Cognitive Function: Normal cognitive status assessed by direct observation by this  Nurse Health Advisor. No abnormalities found.          Immunizations Immunization History  Administered Date(s) Administered  . Fluad Quad(high Dose 65+) 11/12/2018, 01/03/2020  . Influenza Split 01/10/2011  . Influenza Whole  02/18/2010  . Influenza, High Dose Seasonal PF 12/25/2016, 01/04/2018  . Influenza-Unspecified 01/05/2013, 01/23/2015, 02/06/2016  . PFIZER(Purple Top)SARS-COV-2 Vaccination 04/18/2019, 05/09/2019, 02/04/2020  . Pneumococcal Conjugate-13 03/28/2015  . Pneumococcal Polysaccharide-23 04/30/2016  . Tdap 03/24/2004    TDAP status: Due, Education has been provided regarding the importance of this vaccine. Advised may receive this vaccine at local pharmacy or Health Dept. Aware to provide a copy of the vaccination record if obtained from local pharmacy or Health Dept. Verbalized acceptance and understanding.  Flu Vaccine status: Up to date  Pneumococcal vaccine status: Up to date  Covid-19 vaccine status: Completed vaccines  Qualifies for Shingles Vaccine? Yes   Zostavax completed No   Shingrix Completed?: No.    Education has been provided regarding the importance of this vaccine. Patient has been advised to call insurance company to determine out of pocket expense if they have not yet received this vaccine. Advised may also receive vaccine at local pharmacy or Health Dept. Verbalized acceptance and understanding.  Screening Tests Health Maintenance  Topic Date Due  . TETANUS/TDAP  03/24/2014  . FOOT EXAM  03/03/2019  . HEMOGLOBIN A1C  07/03/2020  . COVID-19 Vaccine (4 - Booster for Pfizer series) 08/03/2020  . OPHTHALMOLOGY EXAM  01/03/2021  . COLONOSCOPY (Pts 45-9yrs Insurance coverage will need to be confirmed)  09/28/2024  . INFLUENZA VACCINE  Completed  . Hepatitis C Screening  Completed  . PNA vac Low Risk Adult  Completed  . HPV VACCINES  Aged Out    Health Maintenance  Health Maintenance Due  Topic Date Due  . TETANUS/TDAP  03/24/2014  . FOOT EXAM  03/03/2019    Colorectal cancer screening: Type of screening: Colonoscopy. Completed 09/29/2014. Repeat every 10 years  Lung Cancer Screening: (Low Dose CT Chest recommended if Age 36-80 years, 30 pack-year currently smoking  OR have quit w/in 15years.) does not qualify.   Lung Cancer Screening Referral: no  Additional Screening:  Hepatitis C Screening: does qualify; Completed yes  Vision Screening: Recommended annual ophthalmology exams for early detection of glaucoma and other disorders of the eye. Is the patient up to date with their annual eye exam?  Yes  Who is the provider or what is the name of the office in which the patient attends annual eye exams? [facility] If pt is not established with a provider, would they like to be referred to a provider to establish care? No .   Dental Screening: Recommended annual dental exams for proper oral hygiene  Community Resource Referral / Chronic Care Management: CRR required this visit?  No   CCM required this visit?  No      Plan:     I have personally reviewed and noted the following in the patient's chart:   . Medical and social history . Use of alcohol, tobacco or illicit drugs  . Current medications and supplements . Functional ability and status . Nutritional status . Physical activity . Advanced directives . List of other physicians . Hospitalizations, surgeries, and ER visits in previous 12 months . Vitals . Screenings to include cognitive, depression, and falls . Referrals and appointments  In addition, I have reviewed and discussed with patient certain preventive protocols, quality metrics, and best practice recommendations. A written  personalized care plan for preventive services as well as general preventive health recommendations were provided to patient.     Mickeal Needy, LPN   10/28/5782   Nurse Notes:  Medications reviewed with patient; no opioid use noted.

## 2020-05-28 NOTE — Patient Instructions (Addendum)
Jonathon Armstrong , Thank you for taking time to come for your Medicare Wellness Visit. I appreciate your ongoing commitment to your health goals. Please review the following plan we discussed and let me know if I can assist you in the future.   Screening recommendations/referrals: Colonoscopy: last done 09/29/2014; due every 10 years Recommended yearly ophthalmology/optometry visit for glaucoma screening and checkup Recommended yearly dental visit for hygiene and checkup  Vaccinations: Influenza vaccine: 01/03/2020 Pneumococcal vaccine: 03/28/2015, 04/30/2016 Tdap vaccine: overdue; patient can check with local pharmacy. Shingles vaccine: never done; patient can check with local pharmacy. Covid-19: 04/18/2019, 05/09/2019, 02/04/2020  Advanced directives: Please bring a copy of your health care power of attorney and living will to the office at your convenience.  Conditions/risks identified: Yes; Reviewed health maintenance screenings with patient today and relevant education, vaccines, and/or referrals were provided. Please continue to do your personal lifestyle choices by: daily care of teeth and gums, regular physical activity (goal should be 5 days a week for 30 minutes), eat a healthy diet, avoid tobacco and drug use, limiting any alcohol intake, taking a low-dose aspirin (if not allergic or have been advised by your provider otherwise) and taking vitamins and minerals as recommended by your provider. Continue doing brain stimulating activities (puzzles, reading, adult coloring books, staying active) to keep memory sharp. Continue to eat heart healthy diet (full of fruits, vegetables, whole grains, lean protein, water--limit salt, fat, and sugar intake) and increase physical activity as tolerated.  Next appointment: Please schedule your next Medicare Wellness Visit with your Nurse Health Advisor in 1 year by calling [phone].  Preventive Care 71 Years and Older, Male Preventive care refers to lifestyle  choices and visits with your health care provider that can promote health and wellness. What does preventive care include?  A yearly physical exam. This is also called an annual well check.  Dental exams once or twice a year.  Routine eye exams. Ask your health care provider how often you should have your eyes checked.  Personal lifestyle choices, including:  Daily care of your teeth and gums.  Regular physical activity.  Eating a healthy diet.  Avoiding tobacco and drug use.  Limiting alcohol use.  Practicing safe sex.  Taking low doses of aspirin every day.  Taking vitamin and mineral supplements as recommended by your health care provider. What happens during an annual well check? The services and screenings done by your health care provider during your annual well check will depend on your age, overall health, lifestyle risk factors, and family history of disease. Counseling  Your health care provider may ask you questions about your:  Alcohol use.  Tobacco use.  Drug use.  Emotional well-being.  Home and relationship well-being.  Sexual activity.  Eating habits.  History of falls.  Memory and ability to understand (cognition).  Work and work Statistician. Screening  You may have the following tests or measurements:  Height, weight, and BMI.  Blood pressure.  Lipid and cholesterol levels. These may be checked every 5 years, or more frequently if you are over 64 years old.  Skin check.  Lung cancer screening. You may have this screening every year starting at age 6 if you have a 30-pack-year history of smoking and currently smoke or have quit within the past 15 years.  Fecal occult blood test (FOBT) of the stool. You may have this test every year starting at age 61.  Flexible sigmoidoscopy or colonoscopy. You may have a sigmoidoscopy every 5  years or a colonoscopy every 10 years starting at age 38.  Prostate cancer screening. Recommendations will  vary depending on your family history and other risks.  Hepatitis C blood test.  Hepatitis B blood test.  Sexually transmitted disease (STD) testing.  Diabetes screening. This is done by checking your blood sugar (glucose) after you have not eaten for a while (fasting). You may have this done every 1-3 years.  Abdominal aortic aneurysm (AAA) screening. You may need this if you are a current or former smoker.  Osteoporosis. You may be screened starting at age 68 if you are at high risk. Talk with your health care provider about your test results, treatment options, and if necessary, the need for more tests. Vaccines  Your health care provider may recommend certain vaccines, such as:  Influenza vaccine. This is recommended every year.  Tetanus, diphtheria, and acellular pertussis (Tdap, Td) vaccine. You may need a Td booster every 10 years.  Zoster vaccine. You may need this after age 35.  Pneumococcal 13-valent conjugate (PCV13) vaccine. One dose is recommended after age 11.  Pneumococcal polysaccharide (PPSV23) vaccine. One dose is recommended after age 79. Talk to your health care provider about which screenings and vaccines you need and how often you need them. This information is not intended to replace advice given to you by your health care provider. Make sure you discuss any questions you have with your health care provider. Document Released: 04/06/2015 Document Revised: 11/28/2015 Document Reviewed: 01/09/2015 Elsevier Interactive Patient Education  2017 [employer] Prevention in the Home Falls can cause injuries. They can happen to people of all ages. There are many things you can do to make your home safe and to help prevent falls. What can I do on the outside of my home?  Regularly fix the edges of walkways and driveways and fix any cracks.  Remove anything that might make you trip as you walk through a door, such as a raised step or threshold.  Trim any  bushes or trees on the path to your home.  Use bright outdoor lighting.  Clear any walking paths of anything that might make someone trip, such as rocks or tools.  Regularly check to see if handrails are loose or broken. Make sure that both sides of any steps have handrails.  Any raised decks and porches should have guardrails on the edges.  Have any leaves, snow, or ice cleared regularly.  Use sand or salt on walking paths during winter.  Clean up any spills in your garage right away. This includes oil or grease spills. What can I do in the bathroom?  Use night lights.  Install grab bars by the toilet and in the tub and shower. Do not use towel bars as grab bars.  Use non-skid mats or decals in the tub or shower.  If you need to sit down in the shower, use a plastic, non-slip stool.  Keep the floor dry. Clean up any water that spills on the floor as soon as it happens.  Remove soap buildup in the tub or shower regularly.  Attach bath mats securely with double-sided non-slip rug tape.  Do not have throw rugs and other things on the floor that can make you trip. What can I do in the bedroom?  Use night lights.  Make sure that you have a light by your bed that is easy to reach.  Do not use any sheets or blankets that are too big for  your bed. They should not hang down onto the floor.  Have a firm chair that has side arms. You can use this for support while you get dressed.  Do not have throw rugs and other things on the floor that can make you trip. What can I do in the kitchen?  Clean up any spills right away.  Avoid walking on wet floors.  Keep items that you use a lot in easy-to-reach places.  If you need to reach something above you, use a strong step stool that has a grab bar.  Keep electrical cords out of the way.  Do not use floor polish or wax that makes floors slippery. If you must use wax, use non-skid floor wax.  Do not have throw rugs and other  things on the floor that can make you trip. What can I do with my stairs?  Do not leave any items on the stairs.  Make sure that there are handrails on both sides of the stairs and use them. Fix handrails that are broken or loose. Make sure that handrails are as long as the stairways.  Check any carpeting to make sure that it is firmly attached to the stairs. Fix any carpet that is loose or worn.  Avoid having throw rugs at the top or bottom of the stairs. If you do have throw rugs, attach them to the floor with carpet tape.  Make sure that you have a light switch at the top of the stairs and the bottom of the stairs. If you do not have them, ask someone to add them for you. What else can I do to help prevent falls?  Wear shoes that:  Do not have high heels.  Have rubber bottoms.  Are comfortable and fit you well.  Are closed at the toe. Do not wear sandals.  If you use a stepladder:  Make sure that it is fully opened. Do not climb a closed stepladder.  Make sure that both sides of the stepladder are locked into place.  Ask someone to hold it for you, if possible.  Clearly mark and make sure that you can see:  Any grab bars or handrails.  First and last steps.  Where the edge of each step is.  Use tools that help you move around (mobility aids) if they are needed. These include:  Canes.  Walkers.  Scooters.  Crutches.  Turn on the lights when you go into a dark area. Replace any light bulbs as soon as they burn out.  Set up your furniture so you have a clear path. Avoid moving your furniture around.  If any of your floors are uneven, fix them.  If there are any pets around you, be aware of where they are.  Review your medicines with your doctor. Some medicines can make you feel dizzy. This can increase your chance of falling. Ask your doctor what other things that you can do to help prevent falls. This information is not intended to replace advice given to  you by your health care provider. Make sure you discuss any questions you have with your health care provider. Document Released: 01/04/2009 Document Revised: 08/16/2015 Document Reviewed: 04/14/2014 Elsevier Interactive Patient Education  2017 [employer].

## 2020-05-30 NOTE — Progress Notes (Signed)
Virtual Visit via Video Note  I connected with Jonathon Armstrong on 05/30/20 at 10:45 AM EST by a video enabled telemedicine application and verified that I am speaking with the correct person using two identifiers.   I discussed the limitations of evaluation and management by telemedicine and the availability of in person appointments. The patient expressed understanding and agreed to proceed.  Present for the visit:  Myself, Dr Billey Gosling, Lolita Rieger.  The patient is currently at home and I am in the office.    No referring provider.    History of Present Illness: He is here for an acute visit for cold symptoms.  His symptoms started just less than one week ago.    He is experiencing congestion, sore throat, postnasal drip, cough and headaches.  He had a low-grade fever at 1 point, but has not noticed it since.  His cough is getting deeper into his chest.  He had part of his lung removed last year and he is concerned about developing bronchitis and going into pneumonia.  His wife had similar symptoms previously, but has recovered.   He has tried taking nasal rinse, advil, anti-histamines and prescription cough syrup   Review of Systems  Constitutional: Negative for chills and fever (low grade at one point).  HENT: Positive for congestion and sore throat. Negative for ear pain and sinus pain.        Drainage  Respiratory: Positive for cough (gettting deeper into chest). Negative for sputum production, shortness of breath and wheezing.   Gastrointestinal: Negative for abdominal pain, diarrhea and nausea.  Neurological: Positive for headaches. Negative for dizziness.      Social History   Socioeconomic History  . Marital status: Married    Spouse name: Not on file  . Number of children: 2  . Years of education: 80  . Highest education level: Not on file  Occupational History  . Occupation: Retired    Fish farm manager: RETIRED  Tobacco Use  . Smoking status: Never Smoker  .  Smokeless tobacco: Never Used  Vaping Use  . Vaping Use: Never used  Substance and Sexual Activity  . Alcohol use: Yes    Alcohol/week: 2.0 standard drinks    Types: 1 Glasses of wine, 1 Shots of liquor per week    Comment: socially < 2/ week  . Drug use: No  . Sexual activity: Yes  Other Topics Concern  . Not on file  Social History Narrative   Fun: Water, swimming, [employer]; Psychologist, occupational work    Denies abuse and feels safe at home.   Regular exercise      Social Determinants of Health   Financial Resource Strain: Low Risk   . Difficulty of Paying Living Expenses: Not hard at all  Food Insecurity: No Food Insecurity  . Worried About Charity fundraiser in the Last Year: Never true  . Ran Out of Food in the Last Year: Never true  Transportation Needs: No Transportation Needs  . Lack of Transportation (Medical): No  . Lack of Transportation (Non-Medical): No  Physical Activity: Sufficiently Active  . Days of Exercise per Week: 5 days  . Minutes of Exercise per Session: 30 min  Stress: No Stress Concern Present  . Feeling of Stress : Not at all  Social Connections: Socially Integrated  . Frequency of Communication with Friends and Family: More than three times a week  . Frequency of Social Gatherings with Friends and Family: More than three times a  week  . Attends Religious Services: More than 4 times per year  . Active Member of Clubs or Organizations: Yes  . Attends Archivist Meetings: More than 4 times per year  . Marital Status: Married     Observations/Objective: Appears well in NAD Deep cough intermittently Breathing normally Skin appears warm and dry  Assessment and Plan:  See Problem List for Assessment and Plan of chronic medical problems.   Follow Up Instructions:    I discussed the assessment and treatment plan with the patient. The patient was provided an opportunity to ask questions and all were answered. The patient agreed with the plan  and demonstrated an understanding of the instructions.   The patient was advised to call back or seek an in-person evaluation if the symptoms worsen or if the condition fails to improve as anticipated.    [name], MD

## 2020-05-31 ENCOUNTER — Encounter: Payer: Self-pay | Admitting: Internal Medicine

## 2020-05-31 ENCOUNTER — Telehealth (INDEPENDENT_AMBULATORY_CARE_PROVIDER_SITE_OTHER): Payer: Medicare Other | Admitting: Internal Medicine

## 2020-05-31 DIAGNOSIS — J019 Acute sinusitis, unspecified: Secondary | ICD-10-CM | POA: Diagnosis not present

## 2020-05-31 MED ORDER — AZITHROMYCIN 250 MG PO TABS: ORAL_TABLET | ORAL | 0 refills | Status: DC

## 2020-05-31 MED ORDER — HYDROCOD POLST-CPM POLST ER 10-8 MG/5ML PO SUER: 5.0000 mL | Freq: Two times a day (BID) | ORAL | 0 refills | Status: DC | PRN

## 2020-05-31 NOTE — Assessment & Plan Note (Signed)
Acute Likely started as a sinus infection and concern for developing into bronchitis Given his lung surgery last year concern for developing pneumonia Z-Pak Tussionex cough syrup Continue over-the-counter medications Call if no improvement

## 2020-06-03 ENCOUNTER — Other Ambulatory Visit: Payer: Self-pay | Admitting: Cardiology

## 2020-06-03 ENCOUNTER — Other Ambulatory Visit: Payer: Self-pay | Admitting: Pulmonary Disease

## 2020-06-13 ENCOUNTER — Other Ambulatory Visit: Payer: Self-pay | Admitting: Cardiology

## 2020-06-13 DIAGNOSIS — I10 Essential (primary) hypertension: Secondary | ICD-10-CM

## 2020-06-18 NOTE — Progress Notes (Signed)
HPI: FUhyperlipidemia and CAD. I saw him previously for bradycardia in 05-03-2006. Holter monitor showed sinus rhythm with brief PAT and PVC. Last echocardiogram 2008-05-03 showed normal LV function and mild mitral regurgitation.Calcium score January 2021 1368.  There was also note of a 13 mm posterior left lower lobe nodule and 72-monthrepeat chest CT, PET/CT or tissue sampling recommended. Nuclear study January 2021 showed ejection fraction 58% with normal perfusion.  Patient subsequently had left lower lobe lobectomy for lung cancer.  Since last seenthe patient denies any dyspnea on exertion, orthopnea, PND, pedal edema, palpitations, syncope or chest pain.   Current Outpatient Medications  Medication Sig Dispense Refill  . albuterol (VENTOLIN HFA) 108 (90 Base) MCG/ACT inhaler Inhale 2 puffs into the lungs every 6 (six) hours as needed for wheezing or shortness of breath. 1 Inhaler 5  . ARNUITY ELLIPTA 100 MCG/ACT AEPB INHALE 1 PUFF ONCE DAILY. 30 each 0  . azithromycin (ZITHROMAX) 250 MG tablet Take two tabs the first day and then one tab daily for four days 6 tablet 0  . chlorpheniramine-HYDROcodone (TUSSIONEX PENNKINETIC ER) 10-8 MG/5ML SUER Take 5 mLs by mouth every 12 (twelve) hours as needed for cough. 100 mL 0  . ezetimibe-simvastatin (VYTORIN) 10-20 MG tablet TAKE (1) TABLET DAILY AT BEDTIME. 90 tablet 0  . labetalol (NORMODYNE) 200 MG tablet TAKE 1 TABLET BY MOUTH TWICE DAILY. 180 tablet 3  . metFORMIN (GLUCOPHAGE) 500 MG tablet TAKE TWO TABLETS BY MOUTH TWICE DAILY WITH FOOD 120 tablet 0  . spironolactone (ALDACTONE) 25 MG tablet TAKE 1 TABLET ONCE DAILY. 90 tablet 1  . telmisartan (MICARDIS) 80 MG tablet TAKE 1 TABLET ONCE DAILY. 90 tablet 3   No current facility-administered medications for this visit.     Past Medical History:  Diagnosis Date  . Allergy    seasonal  . Asthma 22019/02/10 . [place]    "vagal response"post nasal polypectomy  . Complication of anesthesia  202-12-2004  Coded x2 after nasal polyp removal (Believes vagal nerve response)  . Diabetes mellitus without complication [place]    pre but take metformin  . Diverticulosis of colon   . FH: colonic polyps    hx of  . Hyperlipidemia   . Hypertension   . Mitral regurgitation    mild & aortic Valve calcification on 2d echo;sbe prophylaxis  . Parotitis, acute 2February 10, 2013 . Seasonal rhinitis   . Sleep apnea    CPAP    Past Surgical History:  Procedure Laterality Date  . APPENDECTOMY    . asytole post nasal polypectomy  202-12-2006 . colonoscopy with polypectomy  2February 10, 2010  Dr BOlevia Perches . heriorraphy     x2  . HERNIA REPAIR    . INTERCOSTAL NERVE BLOCK Left 08/16/2019   Procedure: Intercostal Nerve Block;  Surgeon: LLajuana Matte MD;  [place] [facility]  Service: Thoracic;  Laterality: Left;  .Marland KitchenMIDDLE EAR SURGERY     TM replacement  . NASAL SINUS SURGERY  1994, [id number]  X3  . NODE DISSECTION  08/16/2019   Procedure: Node Dissection;  Surgeon: LLajuana Matte MD;  [place] [facility]  Service: Thoracic;;  . TONSILLECTOMY AND ADENOIDECTOMY    . VIDEO BRONCHOSCOPY N/A 08/16/2019   Procedure: VIDEO BRONCHOSCOPY;  Surgeon: LLajuana Matte MD;  [facility];  Service: Thoracic;  Laterality: N/A;    Social History   Socioeconomic History  . Marital status: Married    Spouse  name: Not on file  . Number of children: 2  . Years of education: 32  . Highest education level: Not on file  Occupational History  . Occupation: Retired    Fish farm manager: RETIRED  Tobacco Use  . Smoking status: Never Smoker  . Smokeless tobacco: Never Used  Vaping Use  . Vaping Use: Never used  Substance and Sexual Activity  . Alcohol use: Yes    Alcohol/week: 2.0 standard drinks    Types: 1 Glasses of wine, 1 Shots of liquor per week    Comment: socially < 2/ week  . Drug use: No  . Sexual activity: Yes  Other Topics Concern  . Not on file  Social History Narrative   Fun: Water, swimming, [employer];  Psychologist, occupational work    Denies abuse and feels safe at home.   Regular exercise      Social Determinants of Health   Financial Resource Strain: Low Risk   . Difficulty of Paying Living Expenses: Not hard at all  Food Insecurity: No Food Insecurity  . Worried About Charity fundraiser in the Last Year: Never true  . Ran Out of Food in the Last Year: Never true  Transportation Needs: No Transportation Needs  . Lack of Transportation (Medical): No  . Lack of Transportation (Non-Medical): No  Physical Activity: Sufficiently Active  . Days of Exercise per Week: 5 days  . Minutes of Exercise per Session: 30 min  Stress: No Stress Concern Present  . Feeling of Stress : Not at all  Social Connections: Socially Integrated  . Frequency of Communication with Friends and Family: More than three times a week  . Frequency of Social Gatherings with Friends and Family: More than three times a week  . Attends Religious Services: More than 4 times per year  . Active Member of Clubs or Organizations: Yes  . Attends Archivist Meetings: More than 4 times per year  . Marital Status: Married  Human resources officer Violence: Not on file    Family History  Problem Relation Age of Onset  . Cancer Mother        liver &multiple myeloma  . Heart disease Mother        CABG, Angina  . Hyperlipidemia Mother   . Hypertension Father   . Heart disease Father        MI in 88"s  . Dementia Father   . Cancer Maternal Grandfather        bone  . Other Sister        perforated bowel  . Other Sister        cns aneurysm  . Diabetes Neg Hx   . Stroke Neg Hx   . Colon cancer Neg Hx     ROS: no fevers or chills, productive cough, hemoptysis, dysphasia, odynophagia, melena, hematochezia, dysuria, hematuria, rash, seizure activity, orthopnea, PND, pedal edema, claudication. Remaining systems are negative.  Physical Exam: Well-developed well-nourished in no acute distress.  Skin is warm and dry.  HEENT is  normal.  Neck is supple.  Chest is clear to auscultation with normal expansion.  Cardiovascular exam is regular rate and rhythm.  Abdominal exam nontender or distended. No masses palpated. Extremities show no edema. neuro grossly intact  ECG-normal sinus rhythm at a rate of 73, nonspecific ST changes.  Personally reviewed  A/P  1 coronary artery disease-based on previous CT showing elevated calcium score.  Follow-up nuclear study showed no ischemia.  Continue statin and resume ASA  81 mg daily.  2 hyperlipidemia-given documented CAD will DC vytorin and treat with crestor 40 mg daily.  Check lipids and liver in 12 weeks.  3 hypertension-blood pressure controlled.  Continue present medications.  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  Patient has had no recurrences.  5 diabetes mellitus-Per primary care.  Kirk Ruths, MD

## 2020-06-21 ENCOUNTER — Ambulatory Visit (INDEPENDENT_AMBULATORY_CARE_PROVIDER_SITE_OTHER): Payer: Medicare Other | Admitting: Cardiology

## 2020-06-21 ENCOUNTER — Encounter: Payer: Self-pay | Admitting: Cardiology

## 2020-06-21 ENCOUNTER — Other Ambulatory Visit: Payer: Self-pay

## 2020-06-21 VITALS — BP 124/74 | HR 73 | Ht 70.0 in | Wt 213.4 lb

## 2020-06-21 DIAGNOSIS — E7800 Pure hypercholesterolemia, unspecified: Secondary | ICD-10-CM | POA: Diagnosis not present

## 2020-06-21 DIAGNOSIS — R931 Abnormal findings on diagnostic imaging of heart and coronary circulation: Secondary | ICD-10-CM

## 2020-06-21 DIAGNOSIS — I10 Essential (primary) hypertension: Secondary | ICD-10-CM

## 2020-06-21 MED ORDER — ASPIRIN EC 81 MG PO TBEC: 81.0000 mg | DELAYED_RELEASE_TABLET | Freq: Every day | ORAL | 3 refills | Status: AC

## 2020-06-21 MED ORDER — ROSUVASTATIN CALCIUM 40 MG PO TABS: 40.0000 mg | ORAL_TABLET | Freq: Every day | ORAL | 3 refills | Status: DC

## 2020-06-21 NOTE — Patient Instructions (Signed)
Medication Instructions:   ASPIRIN 81 MG ONCE DAILY  STOP VYTORIN  START ROSUVASTATIN 40 MG ONCE DAILY  *If you need a refill on your cardiac medications before your next appointment, please call your pharmacy*   Lab Work:  Your physician recommends that you return for lab work in: [place]  If you have labs (blood work) drawn today and your tests are completely normal, you will receive your results only by: Marland Kitchen MyChart Message (if you have MyChart) OR . A paper copy in the mail If you have any lab test that is abnormal or we need to change your treatment, we will call you to review the results.   Follow-Up: At [facility], you and your health needs are our priority.  As part of our continuing mission to provide you with exceptional heart care, we have created designated Provider Care Teams.  These Care Teams include your primary Cardiologist (physician) and Advanced Practice Providers (APPs -  Physician Assistants and Nurse Practitioners) who all work together to provide you with the care you need, when you need it.  We recommend signing up for the patient portal called "MyChart".  Sign up information is provided on this After Visit Summary.  MyChart is used to connect with patients for Virtual Visits (Telemedicine).  Patients are able to view lab/test results, encounter notes, upcoming appointments, etc.  Non-urgent messages can be sent to your provider as well.   To learn more about what you can do with MyChart, go to [email or web address].    Your next appointment:   12 month(s)  The format for your next appointment:   In Person  Provider:   Kirk Ruths, MD

## 2020-06-25 ENCOUNTER — Other Ambulatory Visit: Payer: Self-pay | Admitting: Internal Medicine

## 2020-06-26 ENCOUNTER — Telehealth: Payer: Self-pay

## 2020-06-26 NOTE — Progress Notes (Signed)
    Chronic Care Management Pharmacy Assistant   Name: Jonathon Armstrong  MRN: [id number] DOB: Aug 29, 1950  Reason for Encounter: Initial Call Questions  Recent office visits:  06/01/19 Dr. Quay Burow - PCP  Recent consult visits:  06/21/20 Dr. Stanford Breed - Cardiologists 04/13/20 Dr. Kipp Brood [facility] visits:  None in previous 6 months  Medications: Outpatient Encounter Medications as of 06/26/2020  Medication Sig  . albuterol (VENTOLIN HFA) 108 (90 Base) MCG/ACT inhaler Inhale 2 puffs into the lungs every 6 (six) hours as needed for wheezing or shortness of breath.  . ARNUITY ELLIPTA 100 MCG/ACT AEPB INHALE 1 PUFF ONCE DAILY.  Marland Kitchen aspirin EC 81 MG tablet Take 1 tablet (81 mg total) by mouth daily. Swallow whole.  Marland Kitchen azithromycin (ZITHROMAX) 250 MG tablet Take two tabs the first day and then one tab daily for four days  . chlorpheniramine-HYDROcodone (TUSSIONEX PENNKINETIC ER) 10-8 MG/5ML SUER Take 5 mLs by mouth every 12 (twelve) hours as needed for cough.  . labetalol (NORMODYNE) 200 MG tablet TAKE 1 TABLET BY MOUTH TWICE DAILY.  . metFORMIN (GLUCOPHAGE) 500 MG tablet TAKE TWO TABLETS BY MOUTH TWICE DAILY WITH FOOD  . rosuvastatin (CRESTOR) 40 MG tablet Take 1 tablet (40 mg total) by mouth daily.  Marland Kitchen spironolactone (ALDACTONE) 25 MG tablet TAKE 1 TABLET ONCE DAILY.  Marland Kitchen telmisartan (MICARDIS) 80 MG tablet TAKE 1 TABLET ONCE DAILY.   No facility-administered encounter medications on file as of 06/26/2020.   Have you seen any other providers since your last visit? Patient stated he seen Dr. Stanford Breed - Cardiologists, Dr. Kipp Brood - Surgeon, since last visits with PCP.  Any changes in your medications or health? Patient stated he seen PCP on 06/21/20, and started on Rosuvastatin 40 mg and Aspirin 81 mg.  Any side effects from any medications? Patient stated no side effects.  Do you have an symptoms or problems not managed by your medications? Patient stated no symptoms or  problems.  Any concerns about your health right now? Patient stated no concerns at this time.  Has your provider asked that you check blood pressure, blood sugar, or follow special diet at home? Patient stated he checks his blood pressure occasionally and it runs 120/70 or sometimes bottom number is low 80's. Patient stated he eats a pretty healthy diet.  Do you get any type of exercise on a regular basis? Patient stated he walks three miles atleast three times a week.  Can you think of a goal you would like to reach for your health? Patient stated he would like to keep an A1C below 70.  Do you have any problems getting your medications? Patient stated no problems getting medications.  Is there anything that you would like to discuss during the appointment? Patient stated not at this time.  Please bring medications and supplements to appointment  Star Rating Drugs: Telmisartan 80 mg  Rosuvastatin 40 mg  Orinda Kenner, RMA Clinical Pharmacists Assistant [phone]  Time Spent: 48 mins

## 2020-06-27 ENCOUNTER — Other Ambulatory Visit: Payer: Self-pay

## 2020-06-27 ENCOUNTER — Ambulatory Visit (INDEPENDENT_AMBULATORY_CARE_PROVIDER_SITE_OTHER): Payer: Medicare Other | Admitting: Pharmacist

## 2020-06-27 DIAGNOSIS — E119 Type 2 diabetes mellitus without complications: Secondary | ICD-10-CM | POA: Diagnosis not present

## 2020-06-27 DIAGNOSIS — J4520 Mild intermittent asthma, uncomplicated: Secondary | ICD-10-CM

## 2020-06-27 DIAGNOSIS — I2584 Coronary atherosclerosis due to calcified coronary lesion: Secondary | ICD-10-CM | POA: Diagnosis not present

## 2020-06-27 DIAGNOSIS — E782 Mixed hyperlipidemia: Secondary | ICD-10-CM | POA: Diagnosis not present

## 2020-06-27 DIAGNOSIS — I10 Essential (primary) hypertension: Secondary | ICD-10-CM | POA: Diagnosis not present

## 2020-06-27 DIAGNOSIS — I2510 Atherosclerotic heart disease of native coronary artery without angina pectoris: Secondary | ICD-10-CM

## 2020-06-27 NOTE — Progress Notes (Signed)
Chronic Care Management Pharmacy Note  06/27/2020 Name:  Jonathon Armstrong MRN:  [id number] DOB:  June 20, 1950  Subjective: Jonathon Armstrong is an 70 y.o. year old male who is a primary patient of Burns, Claudina Lick, MD.  The CCM team was consulted for assistance with disease management and care coordination needs.    Engaged with patient face to face for initial visit in response to provider referral for pharmacy case management and/or care coordination services.   Consent to Services:  The patient was given the following information about Chronic Care Management services today, agreed to services, and gave verbal consent: 1. CCM service includes personalized support from designated clinical staff supervised by the primary care provider, including individualized plan of care and coordination with other care providers 2. 24/7 contact phone numbers for assistance for urgent and routine care needs. 3. Service will only be billed when office clinical staff spend 20 minutes or more in a month to coordinate care. 4. Only one practitioner may furnish and bill the service in a calendar month. 5.The patient may stop CCM services at any time (effective at the end of the month) by phone call to the office staff. 6. The patient will be responsible for cost sharing (co-pay) of up to 20% of the service fee (after annual deductible is met). Patient agreed to services and consent obtained.  Patient Care Team: [name], MD as PCP - General (Internal Medicine) Stanford Breed Denice Bors, MD as PCP - Cardiology (Cardiology) Charlton Haws, [facility] as Pharmacist (Pharmacist) [place], [place], [place] as Consulting Physician (Optometry)   Patient lives at home with wife of 15 years. He has 2 children and 5 grandchildren living in [place], he is a very active grandparent and visits often. He also volunteers in his church.  Recent office visits: 05/31/20 Dr Quay Burow VV: sinusitis, rx'd z-pak and  tussionex.  01/03/20 Dr Quay Burow OV: chronic f/u. Trig elevated, work on wt loss. No med changes.  Recent consult visits: 06/21/20 Dr Stanford Breed (cardiology): f/u HLD, CAD. Coronary CT Ca score 03/2019 1368. D/C vytorin and started rosuvastatin 40 + ASA 81 mg. F/u 3 months for lipid panel.   04/13/20 Dr Lowella Dell (CT surgery): f/u LL lobectomy 07/2019. No issues. No changes.  Dr Ronnald Ramp (dermatology): f/u seborrheic keratosis  Hospital visits: None in previous 6 months  Objective:  Lab Results  Component Value Date   CREATININE 1.17 01/03/2020   BUN 20 01/03/2020   GFR 62.60 01/03/2020   GFRNONAA 47 (L) 08/18/2019   GFRAA 54 (L) 08/18/2019   NA 138 01/03/2020   K 4.3 01/03/2020   CALCIUM 10.2 01/03/2020   CO2 26 01/03/2020   GLUCOSE 137 (H) 01/03/2020    Lab Results  Component Value Date/Time   HGBA1C 6.8 (H) 01/03/2020 09:25 AM   HGBA1C 7.1 (H) 05/17/2019 09:34 AM   GFR 62.60 01/03/2020 09:25 AM   GFR 54.12 (L) 05/17/2019 09:34 AM   MICROALBUR 1.3 04/30/2016 10:55 AM   MICROALBUR <0.7 09/18/2014 09:16 AM    Last diabetic Eye exam:  Lab Results  Component Value Date/Time   HMDIABEYEEXA No Retinopathy 01/04/2020 12:00 AM    Last diabetic Foot exam: No results found for: HMDIABFOOTEX   Lab Results  Component Value Date   CHOL 148 01/03/2020   HDL 36.90 (L) 01/03/2020   LDLCALC 54 09/18/2014   LDLDIRECT 76.0 01/03/2020   TRIG 288.0 (H) 01/03/2020   CHOLHDL 4 01/03/2020    Hepatic  Function Latest Ref Rng & Units 01/03/2020 08/18/2019 08/15/2019  Total Protein 6.0 - 8.3 g/dL 7.2 6.3(L) 6.6  Albumin 3.5 - 5.2 g/dL 4.7 3.6 4.3  AST 0 - 37 U/L _0 ALT 0 - 53 U/L _1 Alk Phosphatase 39 - 117 U/L 70 59 64  Total Bilirubin 0.2 - 1.2 mg/dL 0.5 0.6 0.7  Bilirubin, Direct 0.0 - 0.3 mg/dL - - -    Lab Results  Component Value Date/Time   TSH 1.41 11/12/2018 09:34 AM   TSH 1.42 07/24/2017 09:02 AM    CBC Latest Ref Rng & Units 01/03/2020 08/18/2019 08/17/2019  WBC  4.0 - 10.5 K/uL 6.6 10.9(H) 12.2(H)  Hemoglobin 13.0 - 17.0 g/dL 12.9(L) 12.9(L) 13.1  Hematocrit 39.0 - 52.0 % 37.2(L) 38.1(L) 38.6(L)  Platelets 150.0 - 400.0 K/uL 160.0 143(L) 161    No results found for: VD25OH  Clinical ASCVD: Yes  The 10-year ASCVD risk score Mikey Bussing DC Jr., et al., 2013) is: 34.3%   Values used to calculate the score:     Age: 63 years     Sex: Male     Is Non-Hispanic African American: No     Diabetic: Yes     Tobacco smoker: No     Systolic Blood Pressure: 878 mmHg     Is BP treated: Yes     HDL Cholesterol: 36.9 mg/dL     Total Cholesterol: 148 mg/dL    Depression screen [facility] 2/9 05/28/2020 05/17/2019 03/02/2018  Decreased Interest 0 0 0  Down, Depressed, Hopeless 0 0 0  PHQ - 2 Score 0 0 0  Altered sleeping - - -  Tired, decreased energy - - -  Change in appetite - - -  Feeling bad or failure about yourself  - - -  Trouble concentrating - - -  Moving slowly or fidgety/restless - - -  Suicidal thoughts - - -  PHQ-9 Score - - -     Social History   Tobacco Use  Smoking Status Never Smoker  Smokeless Tobacco Never Used   BP Readings from Last 3 Encounters:  06/21/20 124/74  05/28/20 136/80  04/13/20 121/75   Pulse Readings from Last 3 Encounters:  06/21/20 73  05/28/20 71  04/13/20 80   Wt Readings from Last 3 Encounters:  06/21/20 213 lb 6.4 oz (96.8 kg)  05/28/20 216 lb 12.8 oz (98.3 kg)  04/13/20 214 lb (97.1 kg)   BMI Readings from Last 3 Encounters:  06/21/20 30.62 kg/m  05/28/20 31.11 kg/m  04/13/20 30.71 kg/m    Assessment/Interventions: Review of patient past medical history, allergies, medications, health status, including review of consultants reports, laboratory and other test data, was performed as part of comprehensive evaluation and provision of chronic care management services.   SDOH:  (Social Determinants of Health) assessments and interventions performed: Yes  SDOH Screenings   Alcohol Screen: Low Risk   .  Last Alcohol Screening Score (AUDIT): 3  Depression (PHQ2-9): Low Risk   . PHQ-2 Score: 0  Financial Resource Strain: Low Risk   . Difficulty of Paying Living Expenses: Not hard at all  Food Insecurity: No Food Insecurity  . Worried About Charity fundraiser in the Last Year: Never true  . Ran Out of Food in the Last Year: Never true  Housing: Low Risk   . Last Housing Risk Score: 0  Physical Activity: Sufficiently Active  . Days of Exercise per Week: 5 days  .  Minutes of Exercise per Session: 30 min  Social Connections: Socially Integrated  . Frequency of Communication with Friends and Family: More than three times a week  . Frequency of Social Gatherings with Friends and Family: More than three times a week  . Attends Religious Services: More than 4 times per year  . Active Member of Clubs or Organizations: Yes  . Attends Archivist Meetings: More than 4 times per year  . Marital Status: Married  Stress: No Stress Concern Present  . Feeling of Stress : Not at all  Tobacco Use: Low Risk   . Smoking Tobacco Use: Never Smoker  . Smokeless Tobacco Use: Never Used  Transportation Needs: No Transportation Needs  . Lack of Transportation (Medical): No  . Lack of Transportation (Non-Medical): No    CCM Care Plan  Allergies  Allergen Reactions  . Diltiazem Hcl Other (See Comments) and Cough    ? symptoms  . Penicillins     Rash with Amoxicillin with Mono in college    Medications Reviewed Today    Reviewed by Charlton Haws, [facility] (Pharmacist) on 06/27/20 at [place] List Status: <None>  Medication Order Taking? Sig Documenting Provider Last Dose Status Informant  albuterol (VENTOLIN HFA) 108 (90 Base) MCG/ACT inhaler [id number] Yes Inhale 2 puffs into the lungs every 6 (six) hours as needed for wheezing or shortness of breath. Chesley Mires, MD Taking Active Self  ARNUITY ELLIPTA 100 MCG/ACT AEPB [id number] Yes INHALE 1 PUFF ONCE DAILY. Magdalen Spatz, NP Taking  Active   aspirin EC 81 MG tablet [id number] Yes Take 1 tablet (81 mg total) by mouth daily. Swallow whole. Lelon Perla, MD Taking Active   labetalol (NORMODYNE) 200 MG tablet [id number] Yes TAKE 1 TABLET BY MOUTH TWICE DAILY. Lelon Perla, MD Taking Active   metFORMIN (GLUCOPHAGE) 500 MG tablet [id number] Yes TAKE TWO TABLETS BY MOUTH TWICE DAILY WITH FOOD Burns, Claudina Lick, MD Taking Active   rosuvastatin (CRESTOR) 40 MG tablet [id number] Yes Take 1 tablet (40 mg total) by mouth daily. Lelon Perla, MD Taking Active   spironolactone (ALDACTONE) 25 MG tablet [id number] Yes TAKE 1 TABLET ONCE DAILY. [name], MD Taking Active   telmisartan (MICARDIS) 80 MG tablet [id number] Yes TAKE 1 TABLET ONCE DAILY. Lelon Perla, MD Taking Active           Patient Active Problem List   Diagnosis Date Noted  . Primary adenocarcinoma of lower lobe of left lung ([place]) 09/16/2019  . Diarrhea 09/16/2019  . S/P lobectomy of lung 08/16/2019  . Acute sinus infection 07/05/2019  . Pain and swelling of right lower leg 02/24/2019  . Chronic Eustachian tube dysfunction, right 01/18/2018  . Bilateral hearing loss 01/18/2018  . Cough 06/15/2015  . Diabetes ([place]) 02/25/2013  . Hyperlipidemia 12/05/2010  . NONSPECIFIC ABNORMAL ELECTROCARDIOGRAM 08/13/2009  . Hyperuricemia 02/07/2008  . DIVERTICULOSIS, COLON 10/08/2007  . History of colonic polyps 10/08/2007  . Essential hypertension 03/26/2007  . ASYSTOLE 02/22/2007  . Obstructive sleep apnea 08/28/2006    Immunization History  Administered Date(s) Administered  . Fluad Quad(high Dose 65+) 11/12/2018, 01/03/2020  . Influenza Split 01/10/2011  . Influenza Whole 02/18/2010  . Influenza, High Dose Seasonal PF 12/25/2016, 01/04/2018  . Influenza-Unspecified 01/05/2013, 01/23/2015, 02/06/2016  . PFIZER(Purple Top)SARS-COV-2 Vaccination 04/18/2019, 05/09/2019, 02/04/2020  . Pneumococcal Conjugate-13 03/28/2015  . Pneumococcal Polysaccharide-23  04/30/2016  . Tdap 03/24/2004    Conditions to be addressed/monitored:  Hypertension, Hyperlipidemia, Diabetes,  Coronary Artery Disease and Asthma  Care Plan : [place]  Updates made by Charlton Haws, RPH since 06/27/2020 12:00 AM    Problem: Hypertension, Hyperlipidemia, Diabetes, Coronary Artery Disease and Asthma   Priority: High    Long-Range Goal: Disease management   Start Date: 06/27/2020  Expected End Date: 06/27/2021  This Visit's Progress: On track  Priority: High  Note:   Current Barriers:  . Unable to independently monitor therapeutic efficacy  Pharmacist Clinical Goal(s):  Marland Kitchen Patient will achieve adherence to monitoring guidelines and medication adherence to achieve therapeutic efficacy through collaboration with PharmD and provider.   Interventions: . 1:1 collaboration with [name], MD regarding development and update of comprehensive plan of care as evidenced by provider attestation and co-signature . Inter-disciplinary care team collaboration (see longitudinal plan of care) . Comprehensive medication review performed; medication list updated in electronic medical record  Hypertension (BP goal <130/80) -Controlled - BP in office and at home is at goal; pt endorses compliance and denies side effects -Current treatment: . Labetalol 200 mg BID . Spironolactone 25 mg daily . Telmisartan 80 mg daily -Current home readings: 120s/70s-80s -Current dietary habits: limits fried foods, bread, salt -Current exercise habits: walking -Denies hypotensive/hypertensive symptoms -Educated on BP goals and benefits of medications for prevention of heart attack, stroke and kidney damage; -Counseled to monitor BP at home weekly, document, and provide log at future appointments -Recommended to continue current medication  Hyperlipidemia: (LDL goal < 70) -Not ideally controlled - LDL goal recently lowered to < 70 due to coronary CT results and elevated Ca  score. Pt recently switched from Vytorin to Rosuvastatin and added aspirin 81 mg for CV risk reduction -Current treatment: . Rosuvastatin 40 mg daily . Aspirin 81 mg daily -Medications previously tried: Vytorin -Educated on Cholesterol goals;  Benefits of statin for ASCVD risk reduction; -Recommended to continue current medication  Diabetes (A1c goal <7%) -Controlled - A1c is at goal; pt endorses compliance and denies side effects -Current medications: Marland Kitchen Metformin 500 mg - 2 tab BID -Current home glucose readings: not checking; home monitoring not indicated -Denies hypoglycemic/hyperglycemic symptoms -Educated on A1c and blood sugar goals; Complications of diabetes including kidney damage, retinal damage, and cardiovascular disease; -Recommended to continue current medication  Asthma (Goal: control symptoms and prevent exacerbations) -hx OSA, lung cancer s/p LLL resection -Controlled - per pt he has not had issues since lung resection; he is not sure Arnuity is doing anything; he plans to take time off Arnuity when prescription runs out -Current treatment  . Arnuity Ellipta 100 mcg/act 1 puff daily (Dr Halford Chessman) . Albuterol HFA prn -Patient reports consistent use of maintenance inhaler -Frequency of rescue inhaler use: rare -Counseled on [place] inhaler technique; Benefits of consistent maintenance inhaler use When to use rescue inhaler -Recommended to continue current medication; it is reasonable to trial time off Arnuity; advised to contact PCP if symptoms worsen off [place] Maintenance -Vaccine gaps: Shingrix, TDAP -Current therapy:  Marland Kitchen Vitamin C 1000 mg daily . Vitamin D 1000 IU daily . Zinc 50 mg daily -Patient is satisfied with current therapy and denies issues -Recommended to continue current medication   Patient Goals/Self-Care Activities . Patient will:  - take medications as prescribed focus on medication adherence by routine check blood pressure weekly,  document, and provide at future appointments engage in dietary modifications by reducing fried foods, bread and salt  Follow Up Plan: Telephone follow up appointment with care management team  member scheduled for: 1 year      Medication Assistance: None required.  Patient affirms current coverage meets needs.  Patient's preferred pharmacy is:  [place], [place] [address] Phone: [phone] Fax: [phone]  Uses pill box? No - prefers bottles Pt endorses 100% compliance  We discussed: Current pharmacy is preferred with insurance plan and patient is satisfied with pharmacy services Patient decided to: Continue current medication management strategy  Care Plan and Follow Up Patient Decision:  Patient agrees to Care Plan and Follow-up.  Plan: Telephone follow up appointment with care management team member scheduled for:  1 year  Charlene Brooke, PharmD, [place], CPP Clinical Pharmacist [facility] [phone]

## 2020-06-27 NOTE — Patient Instructions (Addendum)
Visit Information  Phone number for Pharmacist: [phone]  Thank you for meeting with me to discuss your medications! I look forward to working with you to achieve your health care goals. Below is a summary of what we talked about during the visit:  Goals Addressed            This Visit's Progress   . Track and Manage My Symptoms-Asthma       Timeframe:  Long-Range Goal Priority:  Medium Start Date:          06/27/20                   Expected End Date:    06/27/21                   Follow Up Date 01/20/21   - avoid symptom triggers outdoors - begin a symptom diary - eliminate symptom triggers at home - keep rescue medicines on hand  -Trial time off of Arnuity when prescription runs out. Monitor breathing / coughing and frequency of Albuterol use; if symptoms worsen off of Arnuity, contact Dr Quay Burow   Why is this important?    Keeping track of asthma symptoms can tell you a lot about your asthma control.   Based on symptoms and peak flow results you can see how well you are doing.   Your asthma action plan has a green, yellow and red zone. Green means all is good; it is your goal. Yellow means your symptoms are a little worse. You will need to adjust your medications. Being in the red zone means that your    asthma is out of control. You will need to use your rescue medicines. You may need emergency care.     Notes:       Patient Care Plan: CCM Pharmacy Care Plan    Problem Identified: Hypertension, Hyperlipidemia, Diabetes, Coronary Artery Disease and Asthma   Priority: High    Long-Range Goal: Disease management   Start Date: 06/27/2020  Expected End Date: 06/27/2021  This Visit's Progress: On track  Priority: High  Note:   Current Barriers:  . Unable to independently monitor therapeutic efficacy  Pharmacist Clinical Goal(s):  Marland Kitchen Patient will achieve adherence to monitoring guidelines and medication adherence to achieve therapeutic efficacy through collaboration with  PharmD and provider.   Interventions: . 1:1 collaboration with [name], MD regarding development and update of comprehensive plan of care as evidenced by provider attestation and co-signature . Inter-disciplinary care team collaboration (see longitudinal plan of care) . Comprehensive medication review performed; medication list updated in electronic medical record  Hypertension (BP goal <130/80) -Controlled - BP in office and at home is at goal; pt endorses compliance and denies side effects -Current treatment: . Labetalol 200 mg BID . Spironolactone 25 mg daily . Telmisartan 80 mg daily -Current home readings: 120s/70s-80s -Current dietary habits: limits fried foods, bread, salt -Current exercise habits: walking -Denies hypotensive/hypertensive symptoms -Educated on BP goals and benefits of medications for prevention of heart attack, stroke and kidney damage; -Counseled to monitor BP at home weekly, document, and provide log at future appointments -Recommended to continue current medication  Hyperlipidemia: (LDL goal < 70) -Not ideally controlled - LDL goal recently lowered to < 70 due to coronary CT results and elevated Ca score. Pt recently switched from Vytorin to Rosuvastatin and added aspirin 81 mg for CV risk reduction -Current treatment: . Rosuvastatin 40 mg daily . Aspirin 81 mg daily -Medications  previously tried: Vytorin -Educated on Cholesterol goals;  Benefits of statin for ASCVD risk reduction; -Recommended to continue current medication  Diabetes (A1c goal <7%) -Controlled - A1c is at goal; pt endorses compliance and denies side effects -Current medications: Marland Kitchen Metformin 500 mg - 2 tab BID -Current home glucose readings: not checking; home monitoring not indicated -Denies hypoglycemic/hyperglycemic symptoms -Educated on A1c and blood sugar goals; Complications of diabetes including kidney damage, retinal damage, and cardiovascular disease; -Recommended to  continue current medication  Asthma (Goal: control symptoms and prevent exacerbations) -hx OSA, lung cancer s/p LLL resection -Controlled - per pt he has not had issues since lung resection; he is not sure Arnuity is doing anything; he plans to take time off Arnuity when prescription runs out -Current treatment  . Arnuity Ellipta 100 mcg/act 1 puff daily (Dr Halford Chessman) . Albuterol HFA prn -Patient reports consistent use of maintenance inhaler -Frequency of rescue inhaler use: rare -Counseled on Proper inhaler technique; Benefits of consistent maintenance inhaler use When to use rescue inhaler -Recommended to continue current medication; it is reasonable to trial time off Arnuity; advised to contact PCP if symptoms worsen off [place] Maintenance -Vaccine gaps: Shingrix, TDAP -Current therapy:  Marland Kitchen Vitamin C 1000 mg daily . Vitamin D 1000 IU daily . Zinc 50 mg daily -Patient is satisfied with current therapy and denies issues -Recommended to continue current medication   Patient Goals/Self-Care Activities . Patient will:  - take medications as prescribed focus on medication adherence by routine check blood pressure weekly, document, and provide at future appointments engage in dietary modifications by reducing fried foods, bread and salt  Follow Up Plan: Telephone follow up appointment with care management team member scheduled for: 1 year      Mr. Leask was given information about Chronic Care Management services today including:  1. CCM service includes personalized support from designated clinical staff supervised by his physician, including individualized plan of care and coordination with other care providers 2. 24/7 contact phone numbers for assistance for urgent and routine care needs. 3. Standard insurance, coinsurance, copays and deductibles apply for chronic care management only during months in which we provide at least 20 minutes of these services. Most insurances  cover these services at 100%, however patients may be responsible for any copay, coinsurance and/or deductible if applicable. This service may help you avoid the need for more expensive face-to-face services. 4. Only one practitioner may furnish and bill the service in a calendar month. 5. The patient may stop CCM services at any time (effective at the end of the month) by phone call to the office staff.  Patient agreed to services and verbal consent obtained.   Patient verbalizes understanding of instructions provided today and agrees to view in [place].  Telephone follow up appointment with pharmacy team member scheduled for: 1 year  Charlene Brooke, PharmD, [place], CPP Clinical Pharmacist [facility] Maintenance After Age 3 After age 25, you are at a higher risk for certain long-term diseases and infections as well as injuries from falls. Falls are a major cause of broken bones and head injuries in people who are older than age 60. Getting regular preventive care can help to keep you healthy and well. Preventive care includes getting regular testing and making lifestyle changes as recommended by your health care provider. Talk with your health care provider about:  Which screenings and tests you should have. A screening is a test that  checks for a disease when you have no symptoms.  A diet and exercise plan that is right for you. What should I know about screenings and tests to prevent falls? Screening and testing are the best ways to find a health problem early. Early diagnosis and treatment give you the best chance of managing medical conditions that are common after age 85. Certain conditions and lifestyle choices may make you more likely to have a fall. Your health care provider may recommend:  Regular vision checks. Poor vision and conditions such as cataracts can make you more likely to have a fall. If you wear glasses, make sure to get  your prescription updated if your vision changes.  Medicine review. Work with your health care provider to regularly review all of the medicines you are taking, including over-the-counter medicines. Ask your health care provider about any side effects that may make you more likely to have a fall. Tell your health care provider if any medicines that you take make you feel dizzy or sleepy.  Osteoporosis screening. Osteoporosis is a condition that causes the bones to get weaker. This can make the bones weak and cause them to break more easily.  Blood pressure screening. Blood pressure changes and medicines to control blood pressure can make you feel dizzy.  Strength and balance checks. Your health care provider may recommend certain tests to check your strength and balance while standing, walking, or changing positions.  Foot health exam. Foot pain and numbness, as well as not wearing proper footwear, can make you more likely to have a fall.  Depression screening. You may be more likely to have a fall if you have a fear of falling, feel emotionally low, or feel unable to do activities that you used to do.  Alcohol use screening. Using too much alcohol can affect your balance and may make you more likely to have a fall. What actions can I take to lower my risk of falls? General instructions  Talk with your health care provider about your risks for falling. Tell your health care provider if: ? You fall. Be sure to tell your health care provider about all falls, even ones that seem minor. ? You feel dizzy, sleepy, or off-balance.  Take over-the-counter and prescription medicines only as told by your health care provider. These include any supplements.  Eat a healthy diet and maintain a healthy weight. A healthy diet includes low-fat dairy products, low-fat (lean) meats, and fiber from whole grains, beans, and lots of fruits and vegetables. Home safety  Remove any tripping hazards, such as rugs,  cords, and clutter.  Install safety equipment such as grab bars in bathrooms and safety rails on stairs.  Keep rooms and walkways well-lit. Activity  Follow a regular exercise program to stay fit. This will help you maintain your balance. Ask your health care provider what types of exercise are appropriate for you.  If you need a cane or walker, use it as recommended by your health care provider.  Wear supportive shoes that have nonskid soles.   Lifestyle  Do not drink alcohol if your health care provider tells you not to drink.  If you drink alcohol, limit how much you have: ? 0-1 drink a day for women. ? 0-2 drinks a day for men.  Be aware of how much alcohol is in your drink. In the [place] one drink equals one typical bottle of beer (12 oz), one-half glass of wine (5 oz), or one shot of hard  liquor (1 oz).  Do not use any products that contain nicotine or tobacco, such as cigarettes and e-cigarettes. If you need help quitting, ask your health care provider. Summary  Having a healthy lifestyle and getting preventive care can help to protect your health and wellness after age 86.  Screening and testing are the best way to find a health problem early and help you avoid having a fall. Early diagnosis and treatment give you the best chance for managing medical conditions that are more common for people who are older than age 53.  Falls are a major cause of broken bones and head injuries in people who are older than age 59. Take precautions to prevent a fall at home.  Work with your health care provider to learn what changes you can make to improve your health and wellness and to prevent falls. This information is not intended to replace advice given to you by your health care provider. Make sure you discuss any questions you have with your health care provider. Document Revised: 07/01/2018 Document Reviewed: 01/21/2017 Elsevier Patient Education  2021 [employer].

## 2020-07-04 IMAGING — CR DG CHEST 2V
2 series · 2 of 2 positions shown · non-contrast
Comparison: 08/03/2019

CLINICAL DATA: Preop evaluation for upcoming bronchoscopy

EXAM:
CHEST - 2 VIEW

[w chest pa]
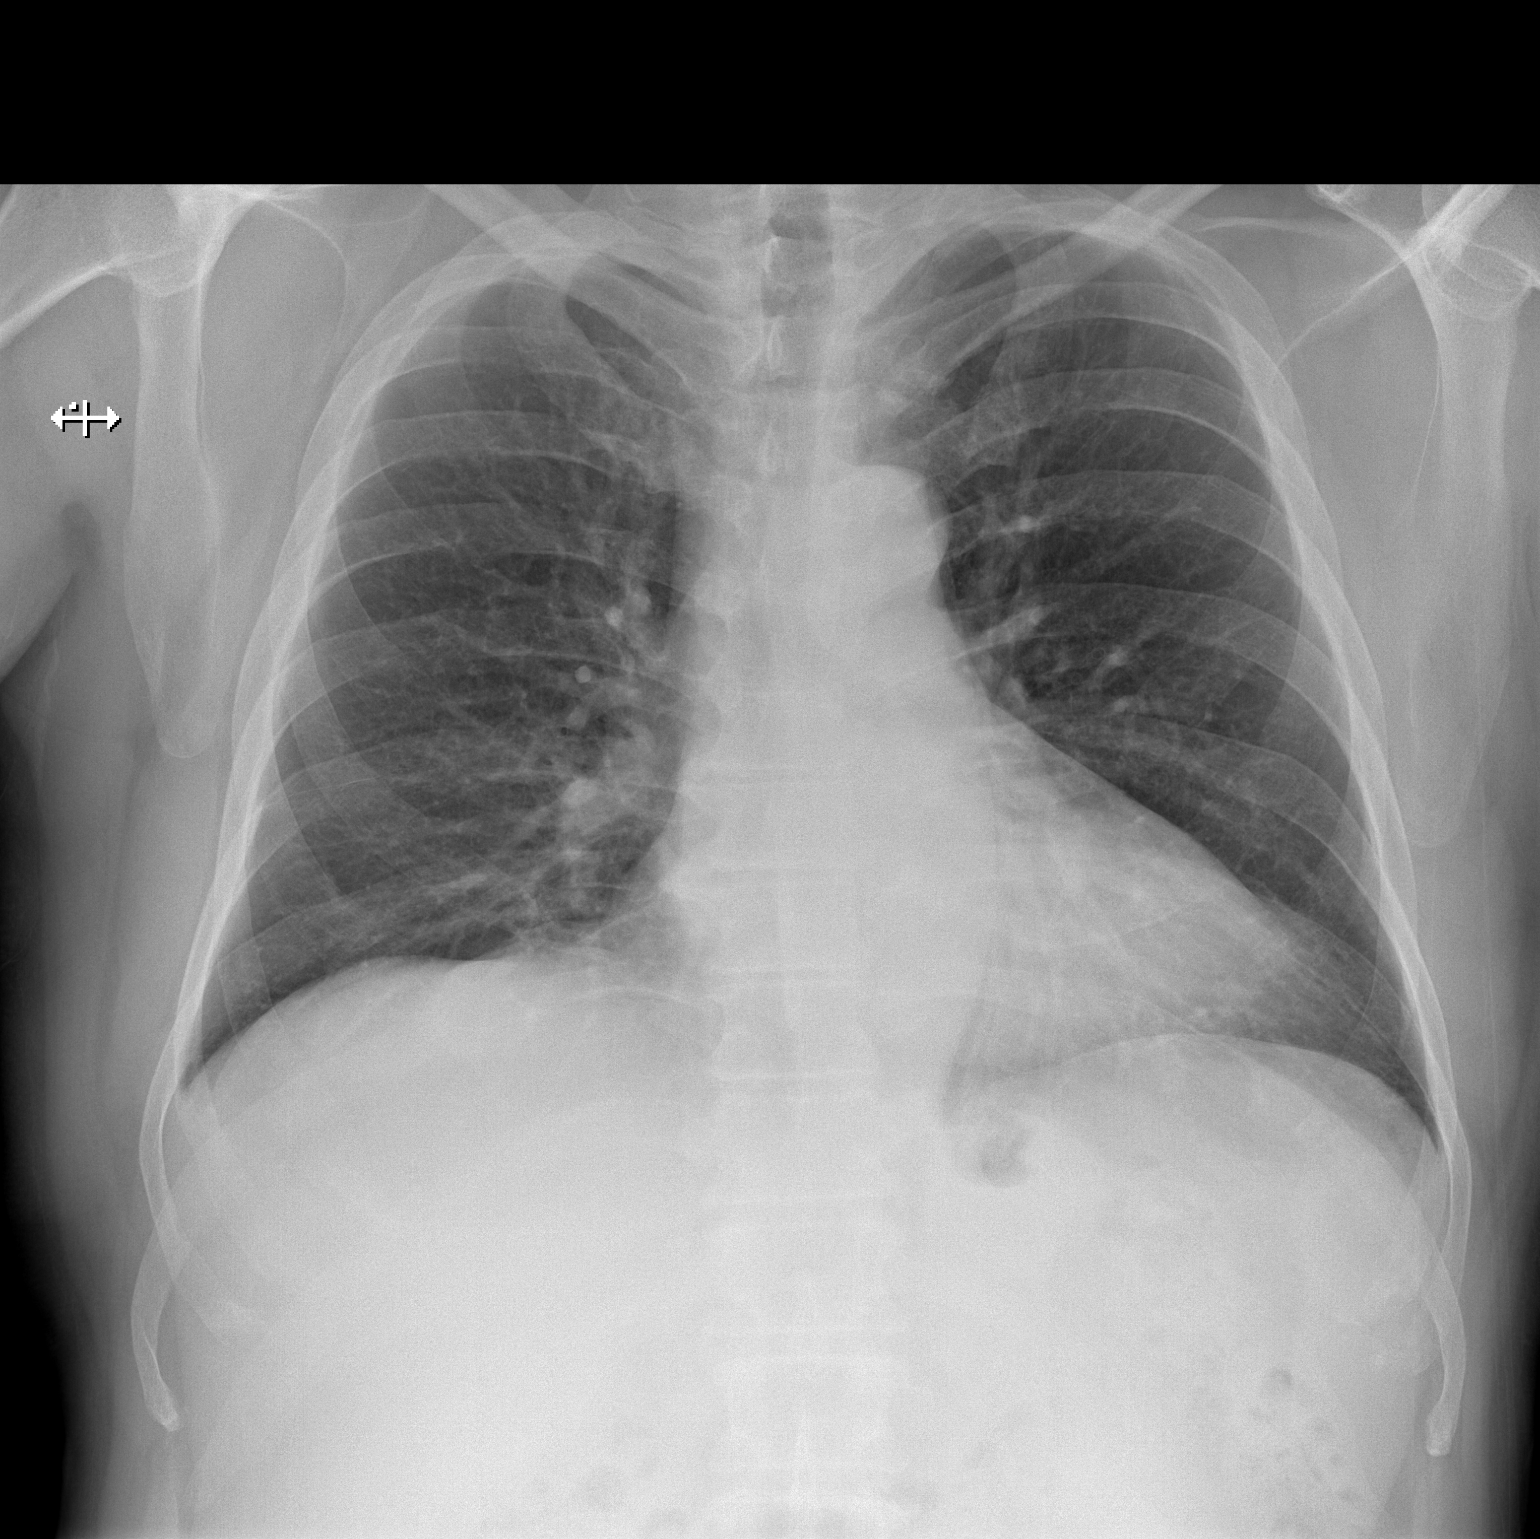

[w chest lat]
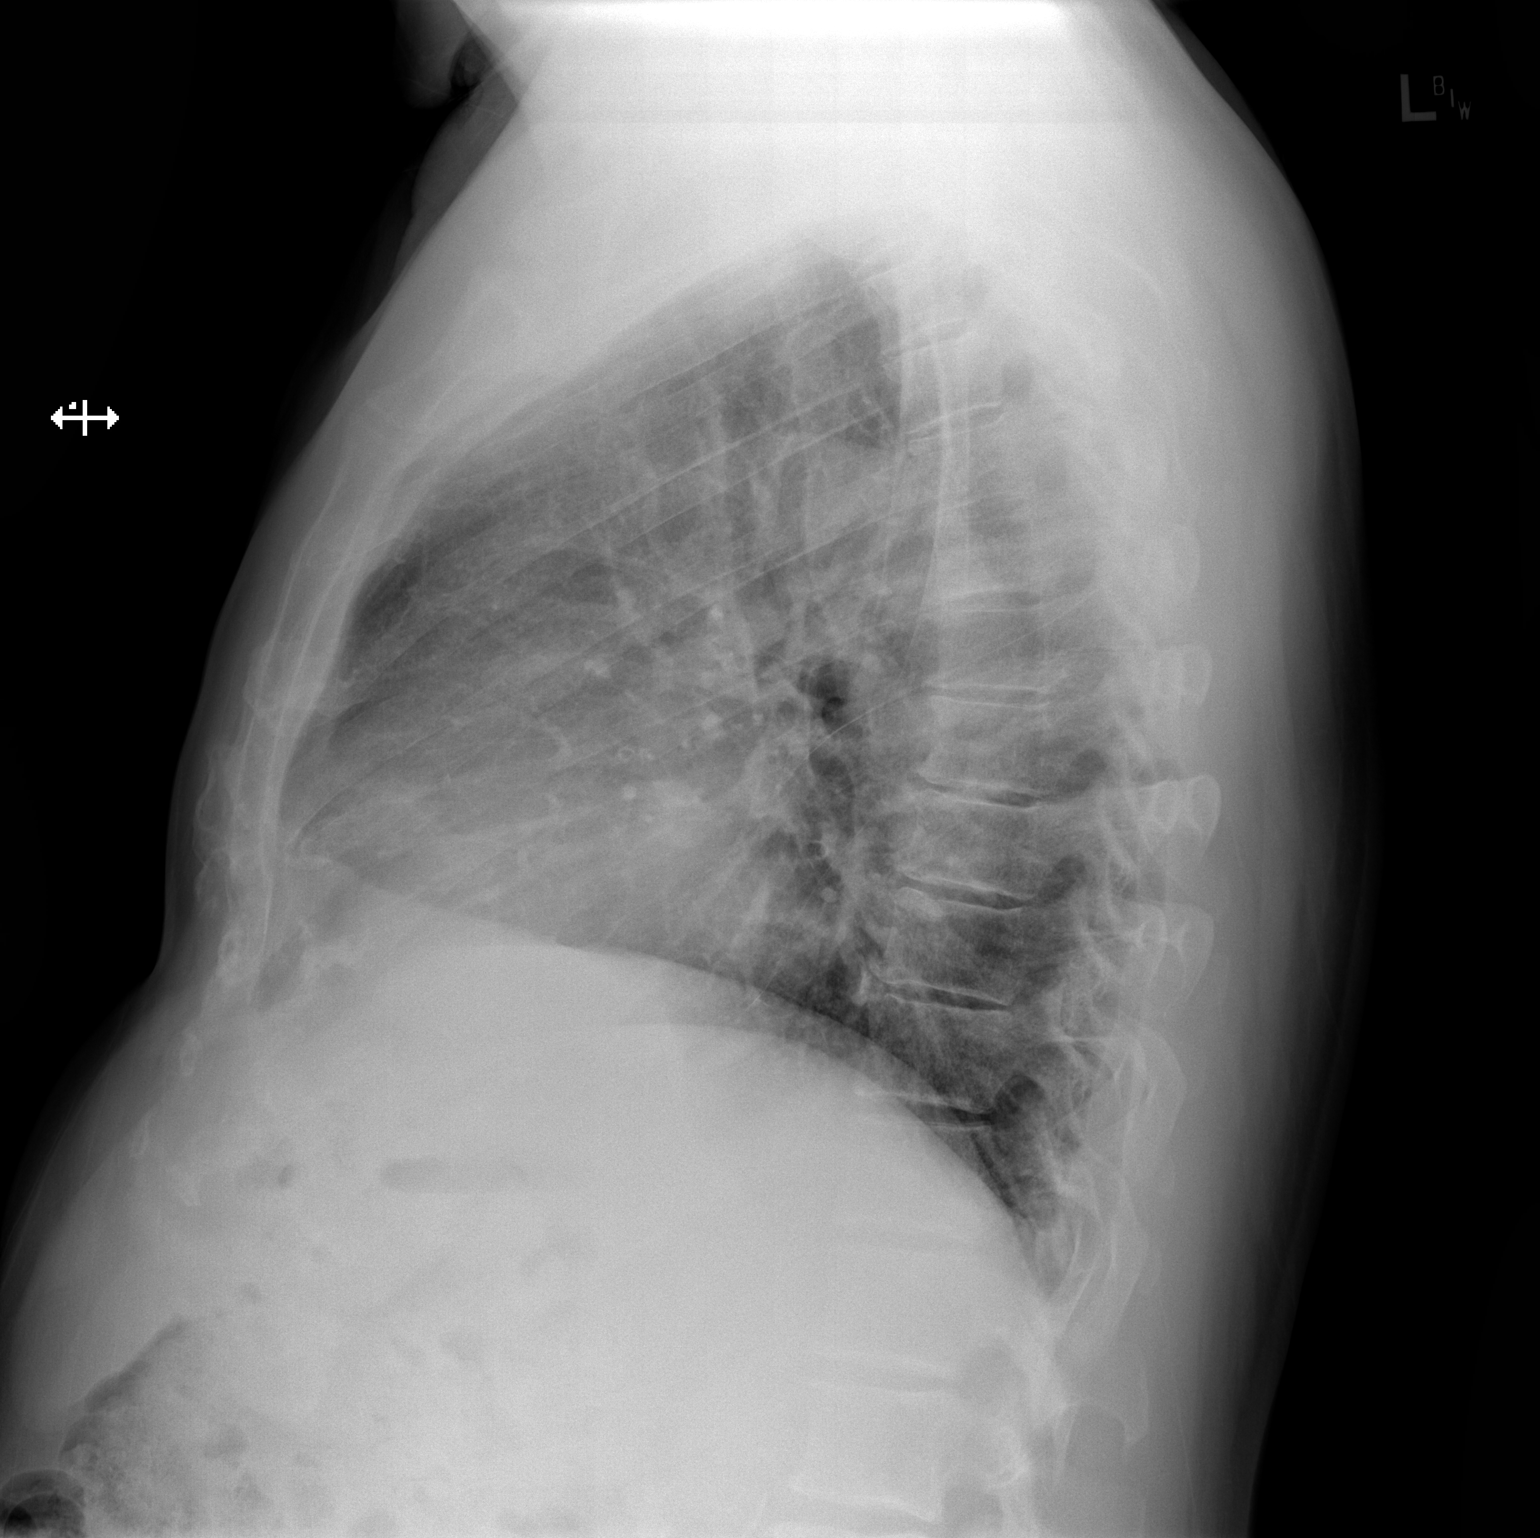

[2 of 2 positions shown; findings below may reference images not displayed]

FINDINGS: Cardiac shadow is within normal limits. The lungs are clear
bilaterally. Known left lower lobe pulmonary nodule is not well
appreciated on this exam. No bony abnormality is seen.
IMPRESSION: No active cardiopulmonary disease.

## 2020-07-07 IMAGING — DX DG CHEST 1V PORT
1 series · 1 of 1 positions shown · non-contrast
Comparison: Portable exam 0826 hours compared to 08/17/2019

CLINICAL DATA: Chest tube, post lobectomy

EXAM:
PORTABLE CHEST 1 VIEW

[chest]
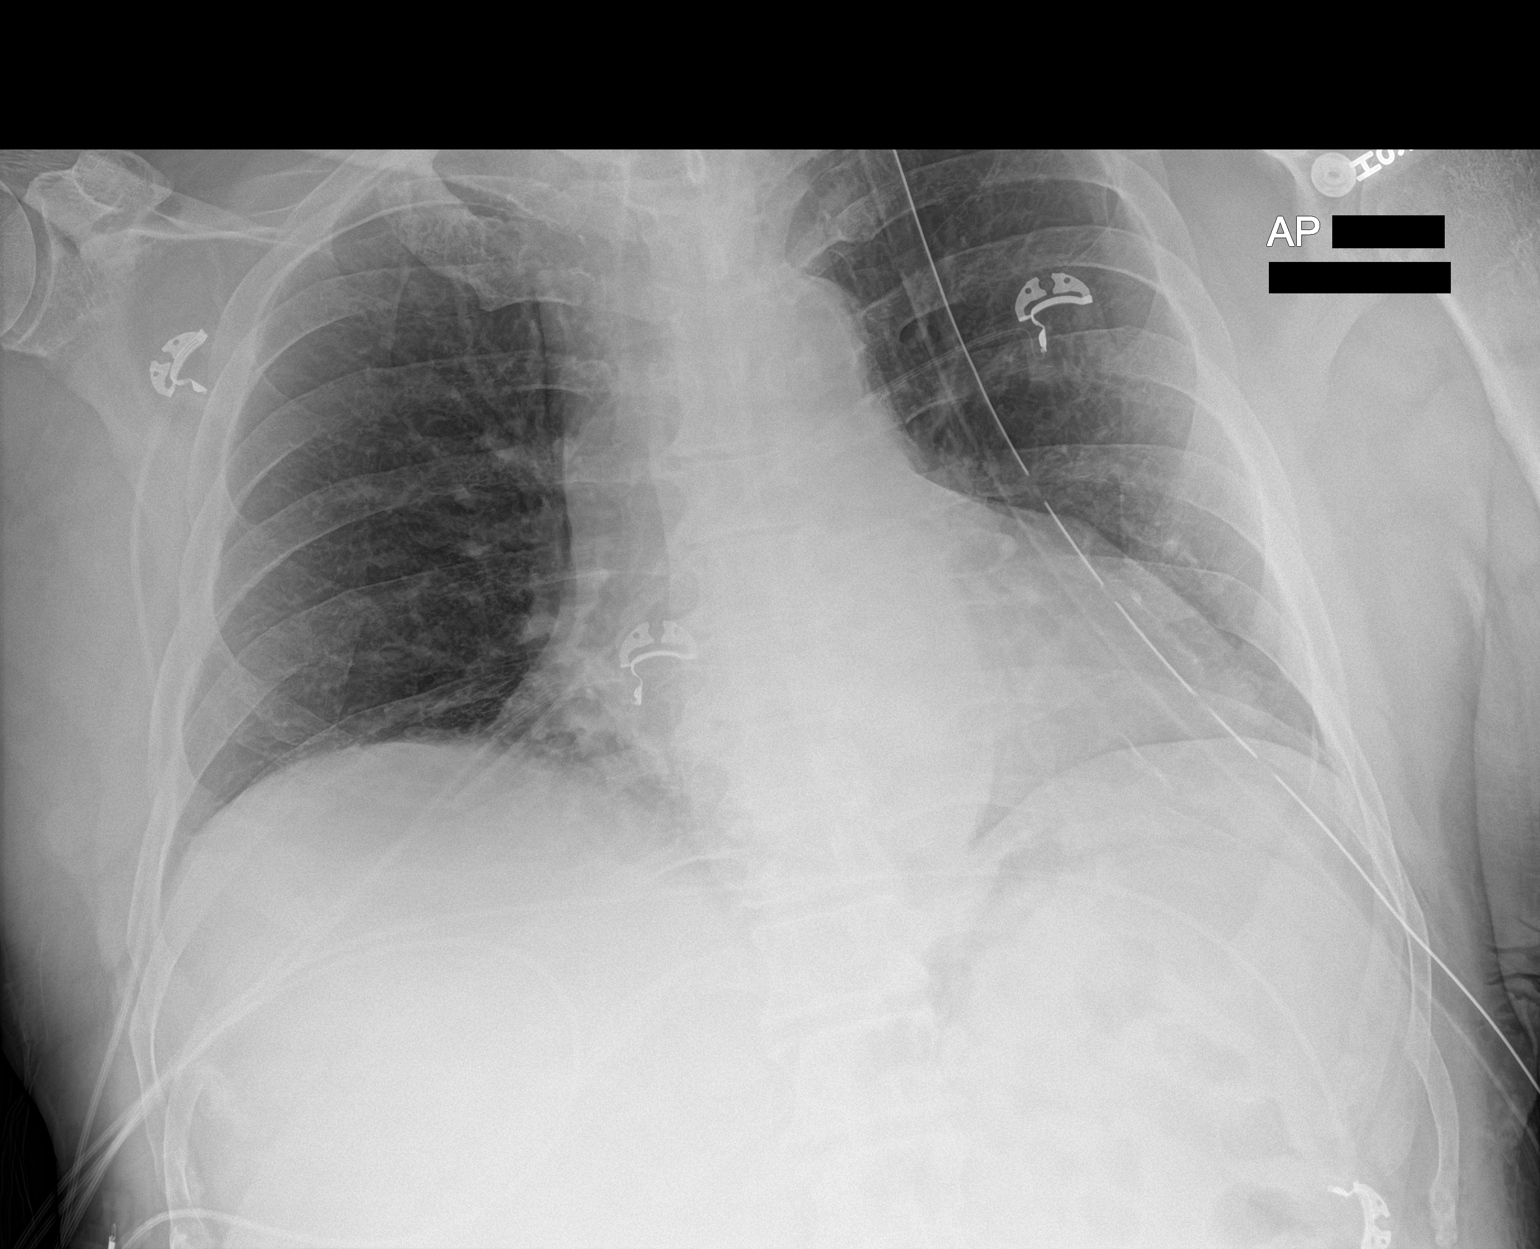

[1 of 1 positions shown; findings below may reference images not displayed]

FINDINGS: LEFT thoracostomy tube unchanged.

Enlargement of cardiac silhouette.

Mediastinal contours and pulmonary vascularity normal.

Bibasilar atelectasis.

Small LEFT apex pneumothorax.

No pleural effusion or segmental consolidation.
IMPRESSION: Small LEFT pneumothorax despite thoracostomy tube.

Bibasilar atelectasis.

## 2020-07-13 ENCOUNTER — Other Ambulatory Visit: Payer: Self-pay | Admitting: Internal Medicine

## 2020-07-25 ENCOUNTER — Other Ambulatory Visit: Payer: Self-pay | Admitting: Internal Medicine

## 2020-08-19 IMAGING — CR DG CHEST 2V
2 series · 2 of 2 positions shown · non-contrast
Comparison: Chest x-ray 08/19/2019.

CLINICAL DATA: Status post left lung surgery.

EXAM:
CHEST - 2 VIEW

[w chest pa]
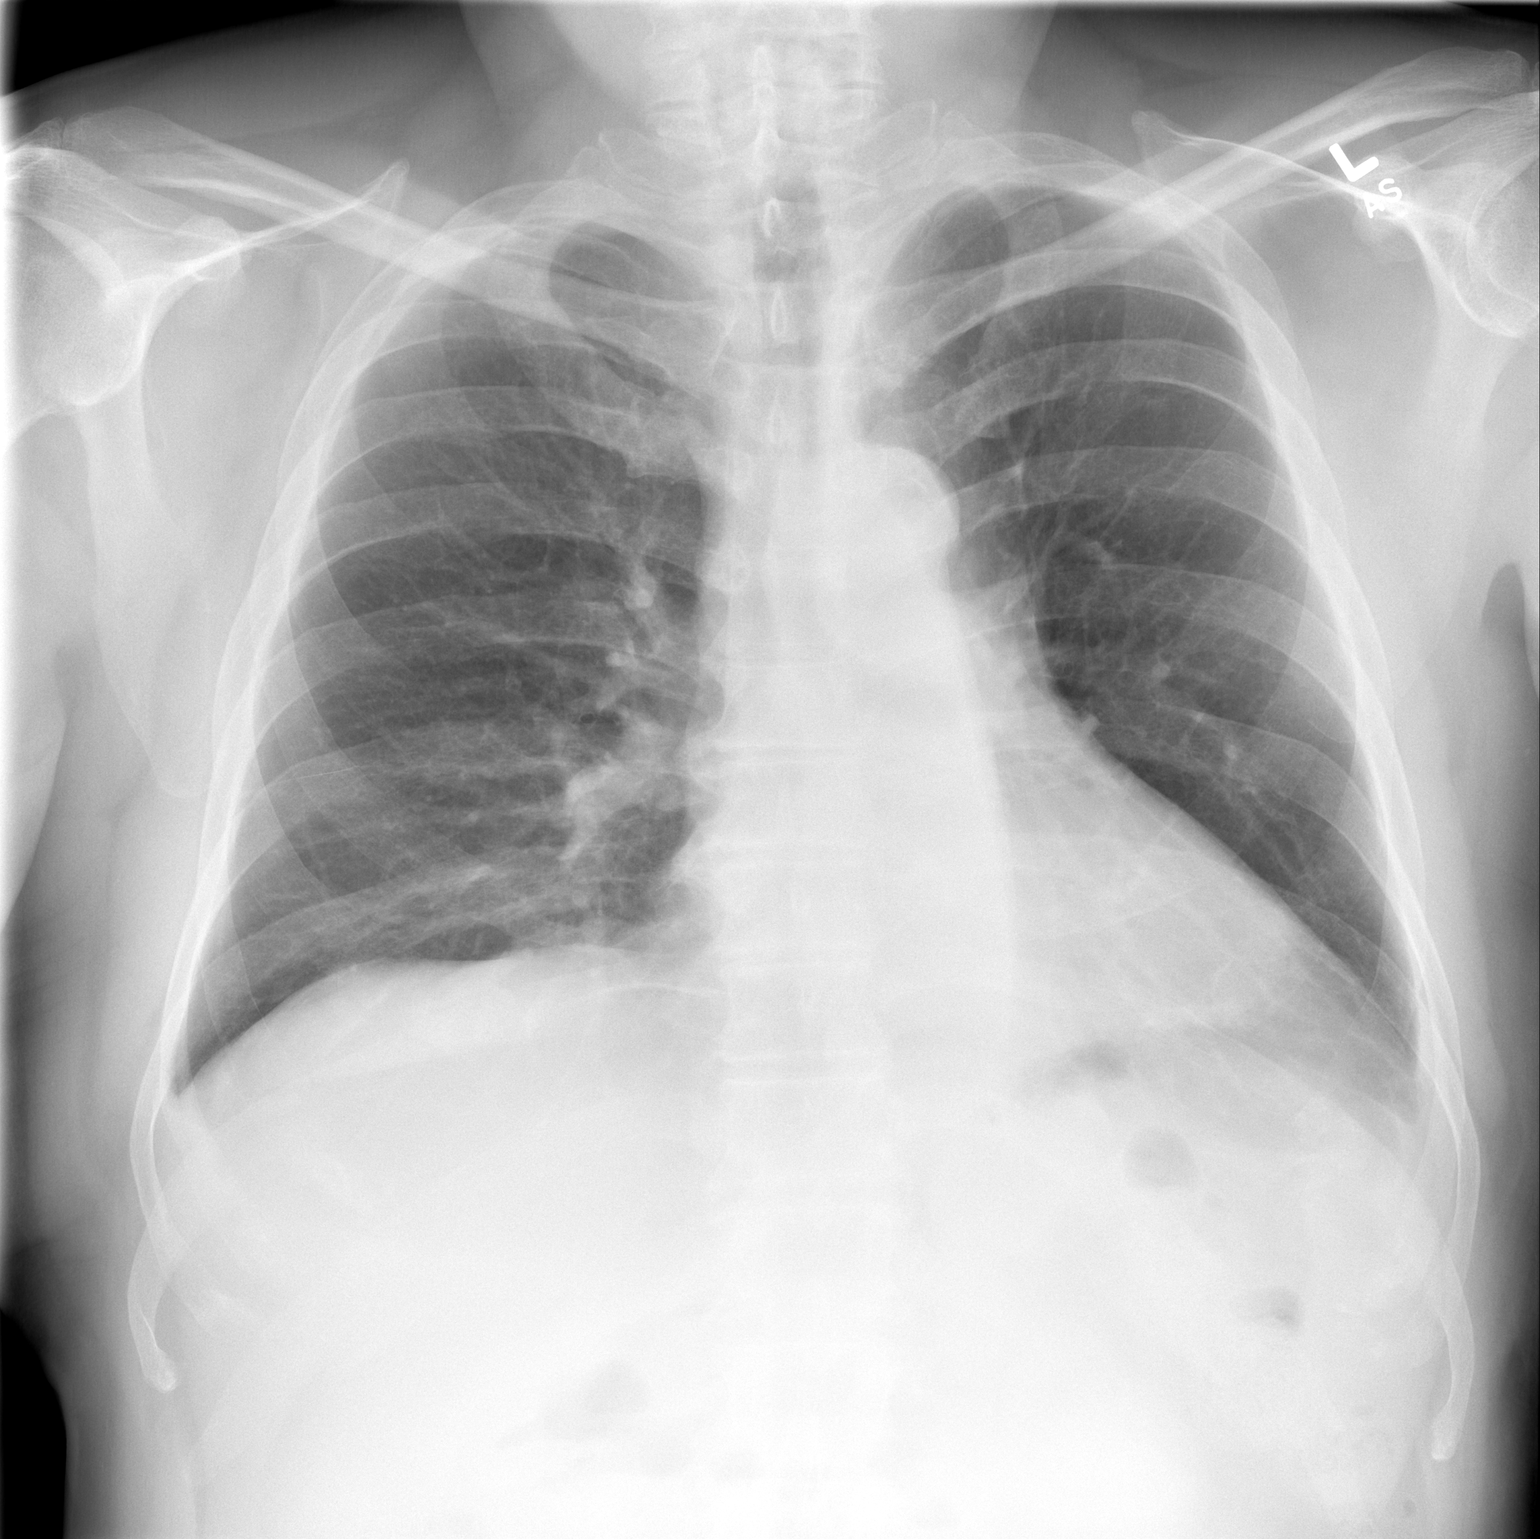

[w chest lat *]
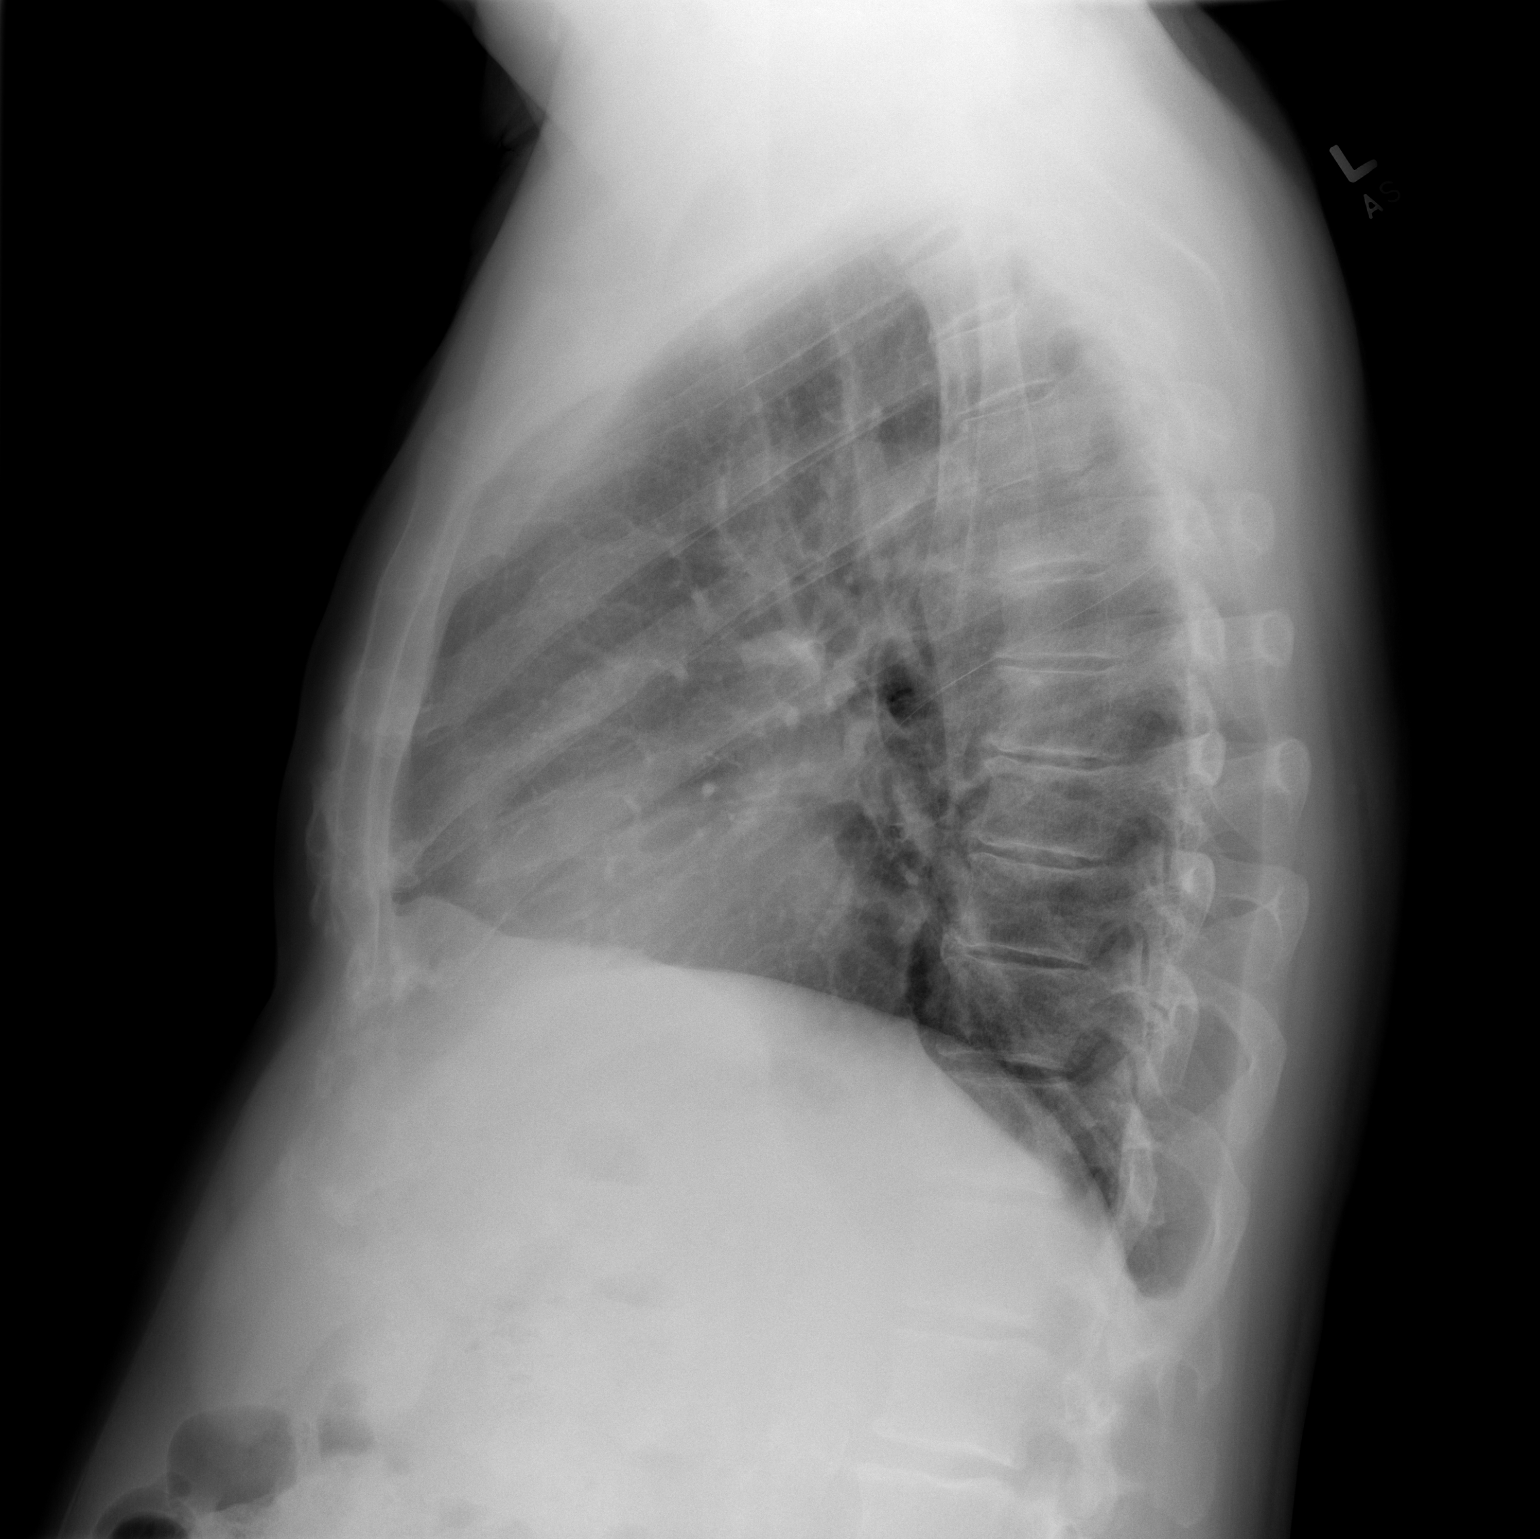

[2 of 2 positions shown; findings below may reference images not displayed]

FINDINGS: Mediastinum hilar structures normal. Heart size normal. Mild right
base subsegmental atelectasis and or scarring. Tiny bilateral
pleural effusions cannot be excluded. Degenerative change thoracic
spine.
IMPRESSION: Mild right base subsegmental atelectasis and or scarring. Tiny
bilateral pleural effusions can not be excluded.

## 2020-08-31 ENCOUNTER — Other Ambulatory Visit: Payer: Self-pay | Admitting: Thoracic Surgery (Cardiothoracic Vascular Surgery)

## 2020-08-31 DIAGNOSIS — C3432 Malignant neoplasm of lower lobe, left bronchus or lung: Secondary | ICD-10-CM

## 2020-09-03 DIAGNOSIS — D2371 Other benign neoplasm of skin of right lower limb, including hip: Secondary | ICD-10-CM | POA: Diagnosis not present

## 2020-09-03 DIAGNOSIS — L723 Sebaceous cyst: Secondary | ICD-10-CM | POA: Diagnosis not present

## 2020-09-03 DIAGNOSIS — D1801 Hemangioma of skin and subcutaneous tissue: Secondary | ICD-10-CM | POA: Diagnosis not present

## 2020-09-03 DIAGNOSIS — L821 Other seborrheic keratosis: Secondary | ICD-10-CM | POA: Diagnosis not present

## 2020-09-03 DIAGNOSIS — Z85828 Personal history of other malignant neoplasm of skin: Secondary | ICD-10-CM | POA: Diagnosis not present

## 2020-09-10 ENCOUNTER — Other Ambulatory Visit: Payer: Self-pay | Admitting: Thoracic Surgery (Cardiothoracic Vascular Surgery)

## 2020-09-10 DIAGNOSIS — C3432 Malignant neoplasm of lower lobe, left bronchus or lung: Secondary | ICD-10-CM

## 2020-09-10 DIAGNOSIS — R911 Solitary pulmonary nodule: Secondary | ICD-10-CM

## 2020-09-20 DIAGNOSIS — E7800 Pure hypercholesterolemia, unspecified: Secondary | ICD-10-CM | POA: Diagnosis not present

## 2020-09-20 DIAGNOSIS — R931 Abnormal findings on diagnostic imaging of heart and coronary circulation: Secondary | ICD-10-CM | POA: Diagnosis not present

## 2020-09-20 LAB — LIPID PANEL
Chol/HDL Ratio: 2.9 ratio (ref 0.0–5.0)
Cholesterol, Total: 99 mg/dL — ABNORMAL LOW (ref 100–199)
HDL: 34 mg/dL — ABNORMAL LOW (ref >39)
LDL Chol Calc (NIH): 32 mg/dL (ref 0–99)
Triglycerides: 209 mg/dL — ABNORMAL HIGH (ref 0–149)
VLDL Cholesterol Cal: 33 mg/dL (ref 5–40)

## 2020-09-20 LAB — HEPATIC FUNCTION PANEL
ALT: 23 IU/L (ref 0–44)
AST: 20 IU/L (ref 0–40)
Albumin: 4.8 g/dL — ABNORMAL HIGH (ref 3.7–4.7)
Alkaline Phosphatase: 75 IU/L (ref 44–121)
Bilirubin Total: 0.3 mg/dL (ref 0.0–1.2)
Bilirubin, Direct: 0.11 mg/dL (ref 0.00–0.40)
Total Protein: 6.7 g/dL (ref 6.0–8.5)

## 2020-09-25 ENCOUNTER — Telehealth: Payer: Self-pay

## 2020-09-25 NOTE — Telephone Encounter (Signed)
Prescription refill request received from [facility] for Metformin 500mg  90day supply.  Last OV: 01/03/20 with request from PCP for pt to return in 7mo.  No 73mo OV noted to be scheduled.  Pt notified & appt scheduled for 09/27/20.

## 2020-09-26 NOTE — Progress Notes (Signed)
Subjective:    Patient ID: Jonathon Armstrong, male    DOB: 03-30-1950, 70 y.o.   MRN: [id number]  HPI The patient is here for follow up of their chronic medical problems, including DM, htn, hld  He is walking for exercise.  He is eating fairly healthy.   He feels good - no concerns.   Medications and allergies reviewed with patient and updated if appropriate.  Patient Active Problem List   Diagnosis Date Noted   Primary adenocarcinoma of lower lobe of left lung ([place]) 09/16/2019   Diarrhea 09/16/2019   S/P lobectomy of lung 08/16/2019   Pain and swelling of right lower leg 02/24/2019   Chronic Eustachian tube dysfunction, right 01/18/2018   Bilateral hearing loss 01/18/2018   Cough 06/15/2015   Diabetes ([place]) 02/25/2013   Hyperlipidemia 12/05/2010   NONSPECIFIC ABNORMAL ELECTROCARDIOGRAM 08/13/2009   Hyperuricemia 02/07/2008   DIVERTICULOSIS, COLON 10/08/2007   History of colonic polyps 10/08/2007   Essential hypertension 03/26/2007   ASYSTOLE 02/22/2007   Obstructive sleep apnea 08/28/2006    Current Outpatient Medications on File Prior to Visit  Medication Sig Dispense Refill   albuterol (VENTOLIN HFA) 108 (90 Base) MCG/ACT inhaler Inhale 2 puffs into the lungs every 6 (six) hours as needed for wheezing or shortness of breath. 1 Inhaler 5   ARNUITY ELLIPTA 100 MCG/ACT AEPB INHALE 1 PUFF ONCE DAILY. 30 each 0   aspirin EC 81 MG tablet Take 1 tablet (81 mg total) by mouth daily. Swallow whole. 90 tablet 3   labetalol (NORMODYNE) 200 MG tablet TAKE 1 TABLET BY MOUTH TWICE DAILY. 180 tablet 3   metFORMIN (GLUCOPHAGE) 500 MG tablet TAKE TWO TABLETS BY MOUTH TWICE DAILY WITH FOOD 120 tablet 1   spironolactone (ALDACTONE) 25 MG tablet TAKE 1 TABLET ONCE DAILY. 90 tablet 1   telmisartan (MICARDIS) 80 MG tablet TAKE 1 TABLET ONCE DAILY. 90 tablet 3   rosuvastatin (CRESTOR) 40 MG tablet Take 1 tablet (40 mg total) by mouth daily. 90 tablet 3   No current facility-administered  medications on file prior to visit.    Past Medical History:  Diagnosis Date   Allergy    seasonal   Asthma 23-May-2017   Asystole ([place])    "vagal response"post nasal polypectomy   Complication of anesthesia 23-May-2004   Coded x2 after nasal polyp removal (Believes vagal nerve response)   Diabetes mellitus without complication ([place])    pre but take metformin   Diverticulosis of colon    FH: colonic polyps    hx of   Hyperlipidemia    Hypertension    Mitral regurgitation    mild & aortic Valve calcification on 2d echo;sbe prophylaxis   Parotitis, acute 2011-05-24   Seasonal rhinitis    Sleep apnea    CPAP    Past Surgical History:  Procedure Laterality Date   APPENDECTOMY     asytole post nasal polypectomy  23-May-2006   colonoscopy with polypectomy  May 23, 2008   Dr Olevia Perches   heriorraphy     x2   [place] Left 08/16/2019   Procedure: Intercostal Nerve Block;  Surgeon: Lajuana Matte, MD;  Location: [facility] OR;  Service: Thoracic;  Laterality: Left;   MIDDLE EAR SURGERY     TM replacement   NASAL SINUS [place], [id number]   X3   NODE DISSECTION  08/16/2019   Procedure: Node Dissection;  Surgeon: Lajuana Matte, MD;  Location: [facility]  OR;  Service: Thoracic;;   TONSILLECTOMY AND ADENOIDECTOMY     VIDEO BRONCHOSCOPY N/A 08/16/2019   Procedure: VIDEO BRONCHOSCOPY;  Surgeon: Lajuana Matte, MD;  Location: [facility] OR;  Service: Thoracic;  Laterality: N/A;    Social History   Socioeconomic History   Marital status: Married    Spouse name: Not on file   Number of children: 2   Years of education: 18   Highest education level: Not on file  Occupational History   Occupation: Retired    Fish farm manager: RETIRED  Tobacco Use   Smoking status: Never   Smokeless tobacco: Never  Vaping Use   Vaping Use: Never used  Substance and Sexual Activity   Alcohol use: Yes    Alcohol/week: 2.0 standard drinks    Types: 1 Glasses of wine, 1 Shots of liquor per week    Comment:  socially < 2/ week   Drug use: No   Sexual activity: Yes  Other Topics Concern   Not on file  Social History Narrative   Fun: Water, swimming, [employer]; Volunteer work    Denies abuse and feels safe at home.   Regular exercise      Social Determinants of Health   Financial Resource Strain: Low Risk    Difficulty of Paying Living Expenses: Not hard at all  Food Insecurity: No Food Insecurity   Worried About Charity fundraiser in the Last Year: Never true   Arboriculturist in the Last Year: Never true  Transportation Needs: No Transportation Needs   Lack of Transportation (Medical): No   Lack of Transportation (Non-Medical): No  Physical Activity: Sufficiently Active   Days of Exercise per Week: 5 days   Minutes of Exercise per Session: 30 min  Stress: No Stress Concern Present   Feeling of Stress : Not at all  Social Connections: Socially Integrated   Frequency of Communication with Friends and Family: More than three times a week   Frequency of Social Gatherings with Friends and Family: More than three times a week   Attends Religious Services: More than 4 times per year   Active Member of [employer] or Organizations: Yes   Attends Music therapist: More than 4 times per year   Marital Status: Married    Family History  Problem Relation Age of Onset   Cancer Mother        liver &multiple myeloma   Heart disease Mother        CABG, Angina   Hyperlipidemia Mother    Hypertension Father    Heart disease Father        MI in 64"s   Dementia Father    Cancer Maternal Grandfather        bone   Other Sister        perforated bowel   Other Sister        cns aneurysm   Diabetes Neg Hx    Stroke Neg Hx    Colon cancer Neg Hx     Review of Systems  Constitutional:  Negative for chills and fever.  Respiratory:  Negative for cough, shortness of breath and wheezing.   Cardiovascular:  Negative for chest pain, palpitations and leg swelling.  Neurological:   Negative for light-headedness and headaches.      Objective:   Vitals:   09/27/20 0913  BP: 122/76  Pulse: 65  Temp: 98 F (36.7 C)  SpO2: 98%   BP Readings from Last 3  Encounters:  09/27/20 122/76  06/21/20 124/74  05/28/20 136/80   Wt Readings from Last 3 Encounters:  09/27/20 221 lb (100.2 kg)  06/21/20 213 lb 6.4 oz (96.8 kg)  05/28/20 216 lb 12.8 oz (98.3 kg)   Body mass index is 31.71 kg/m.   Physical Exam    Constitutional: Appears well-developed and well-nourished. No distress.  HENT:  Head: Normocephalic and atraumatic.  Neck: Neck supple. No tracheal deviation present. No thyromegaly present.  No cervical lymphadenopathy Cardiovascular: Normal rate, regular rhythm and normal heart sounds.   No murmur heard. No carotid bruit .  No edema Pulmonary/Chest: Effort normal and breath sounds normal. No respiratory distress. No has no wheezes. No rales.  Skin: Skin is warm and dry. Not diaphoretic.  Psychiatric: Normal mood and affect. Behavior is normal.      Assessment & Plan:    See Problem List for Assessment and Plan of chronic medical problems.    This visit occurred during the SARS-CoV-2 public health emergency.  Safety protocols were in place, including screening questions prior to the visit, additional usage of staff PPE, and extensive cleaning of exam room while observing appropriate contact time as indicated for disinfecting solutions.

## 2020-09-26 NOTE — Patient Instructions (Addendum)
    Blood work was ordered.      Medications changes include :   none     Please followup in 6 months  

## 2020-09-27 ENCOUNTER — Other Ambulatory Visit: Payer: Self-pay

## 2020-09-27 ENCOUNTER — Ambulatory Visit (INDEPENDENT_AMBULATORY_CARE_PROVIDER_SITE_OTHER): Payer: Medicare Other | Admitting: Internal Medicine

## 2020-09-27 ENCOUNTER — Encounter: Payer: Self-pay | Admitting: Internal Medicine

## 2020-09-27 VITALS — BP 122/76 | HR 65 | Temp 98.0°F | Ht 70.0 in | Wt 221.0 lb

## 2020-09-27 DIAGNOSIS — E782 Mixed hyperlipidemia: Secondary | ICD-10-CM

## 2020-09-27 DIAGNOSIS — I10 Essential (primary) hypertension: Secondary | ICD-10-CM

## 2020-09-27 DIAGNOSIS — I2584 Coronary atherosclerosis due to calcified coronary lesion: Secondary | ICD-10-CM

## 2020-09-27 DIAGNOSIS — E1169 Type 2 diabetes mellitus with other specified complication: Secondary | ICD-10-CM | POA: Diagnosis not present

## 2020-09-27 DIAGNOSIS — I2510 Atherosclerotic heart disease of native coronary artery without angina pectoris: Secondary | ICD-10-CM

## 2020-09-27 DIAGNOSIS — D649 Anemia, unspecified: Secondary | ICD-10-CM

## 2020-09-27 LAB — CBC WITH DIFFERENTIAL/PLATELET
Basophils Absolute: 0 10*3/uL (ref 0.0–0.1)
Basophils Relative: 0.5 % (ref 0.0–3.0)
Eosinophils Absolute: 0.1 10*3/uL (ref 0.0–0.7)
Eosinophils Relative: 2.1 % (ref 0.0–5.0)
HCT: 33.2 % — ABNORMAL LOW (ref 39.0–52.0)
Hemoglobin: 11.6 g/dL — ABNORMAL LOW (ref 13.0–17.0)
Lymphocytes Relative: 25.5 % (ref 12.0–46.0)
Lymphs Abs: 1.3 10*3/uL (ref 0.7–4.0)
MCHC: 34.9 g/dL (ref 30.0–36.0)
MCV: 88.2 fl (ref 78.0–100.0)
Monocytes Absolute: 0.4 10*3/uL (ref 0.1–1.0)
Monocytes Relative: 8.6 % (ref 3.0–12.0)
Neutro Abs: 3.3 10*3/uL (ref 1.4–7.7)
Neutrophils Relative %: 63.3 % (ref 43.0–77.0)
Platelets: 131 10*3/uL — ABNORMAL LOW (ref 150.0–400.0)
RBC: 3.77 Mil/uL — ABNORMAL LOW (ref 4.22–5.81)
RDW: 13.7 % (ref 11.5–15.5)
WBC: 5.1 10*3/uL (ref 4.0–10.5)

## 2020-09-27 LAB — HEMOGLOBIN A1C: Hgb A1c MFr Bld: 7.7 % — ABNORMAL HIGH (ref 4.6–6.5)

## 2020-09-27 LAB — BASIC METABOLIC PANEL
BUN: 21 mg/dL (ref 6–23)
CO2: 26 mEq/L (ref 19–32)
Calcium: 10 mg/dL (ref 8.4–10.5)
Chloride: 104 mEq/L (ref 96–112)
Creatinine, Ser: 1.18 mg/dL (ref 0.40–1.50)
GFR: 62.23 mL/min (ref >60.00)
Glucose, Bld: 211 mg/dL — ABNORMAL HIGH (ref 70–99)
Potassium: 4.4 mEq/L (ref 3.5–5.1)
Sodium: 138 mEq/L (ref 135–145)

## 2020-09-27 MED ORDER — DAPAGLIFLOZIN PROPANEDIOL 10 MG PO TABS: 10.0000 mg | ORAL_TABLET | Freq: Every day | ORAL | 5 refills | Status: DC

## 2020-09-27 NOTE — Addendum Note (Signed)
Addended by: [name] on: 09/27/2020 07:49 PM   Modules accepted: Orders

## 2020-09-27 NOTE — Assessment & Plan Note (Signed)
Chronic Check a1c Continue metformin 1000 mg bidac Low sugar / carb diet Stressed regular exercise

## 2020-09-27 NOTE — Assessment & Plan Note (Signed)
Chronic BP well controlled Continue labetalol 200 mg bid, aldactone 25 mg qd, telmisartan 80 mg qd cmp

## 2020-09-27 NOTE — Assessment & Plan Note (Signed)
Chronic Check lipid panel  Continue crestor 40 mg qd Regular exercise and healthy diet encouraged

## 2020-10-01 ENCOUNTER — Telehealth: Payer: Self-pay | Admitting: Internal Medicine

## 2020-10-01 ENCOUNTER — Other Ambulatory Visit: Payer: Self-pay | Admitting: Internal Medicine

## 2020-10-01 NOTE — Telephone Encounter (Signed)
   Patient called and said that Jardiance 10mg  needs a PA. Please advise

## 2020-10-04 NOTE — Telephone Encounter (Signed)
   Patient is requesting a call back in regards to [place]. He said that the insurance denied the medications

## 2020-10-05 ENCOUNTER — Ambulatory Visit: Payer: Medicare Other | Admitting: Thoracic Surgery (Cardiothoracic Vascular Surgery)

## 2020-10-05 ENCOUNTER — Other Ambulatory Visit: Payer: Self-pay

## 2020-10-05 ENCOUNTER — Ambulatory Visit
Admission: RE | Admit: 2020-10-05 | Discharge: 2020-10-05 | Disposition: A | Discharge status: Home / Self Care | Payer: Medicare Other | Source: Ambulatory Visit | Attending: Thoracic Surgery (Cardiothoracic Vascular Surgery) | Admitting: Thoracic Surgery (Cardiothoracic Vascular Surgery)

## 2020-10-05 ENCOUNTER — Ambulatory Visit (INDEPENDENT_AMBULATORY_CARE_PROVIDER_SITE_OTHER): Payer: Medicare Other | Admitting: Physician Assistant

## 2020-10-05 VITALS — BP 126/75 | HR 74 | Resp 20 | Ht 70.0 in | Wt 220.0 lb

## 2020-10-05 DIAGNOSIS — I700 Atherosclerosis of aorta: Secondary | ICD-10-CM | POA: Diagnosis not present

## 2020-10-05 DIAGNOSIS — C3432 Malignant neoplasm of lower lobe, left bronchus or lung: Secondary | ICD-10-CM

## 2020-10-05 DIAGNOSIS — I2510 Atherosclerotic heart disease of native coronary artery without angina pectoris: Secondary | ICD-10-CM | POA: Diagnosis not present

## 2020-10-05 DIAGNOSIS — I313 Pericardial effusion (noninflammatory): Secondary | ICD-10-CM | POA: Diagnosis not present

## 2020-10-05 DIAGNOSIS — Z902 Acquired absence of lung [part of]: Secondary | ICD-10-CM | POA: Diagnosis not present

## 2020-10-05 DIAGNOSIS — R911 Solitary pulmonary nodule: Secondary | ICD-10-CM

## 2020-10-05 DIAGNOSIS — I2584 Coronary atherosclerosis due to calcified coronary lesion: Secondary | ICD-10-CM

## 2020-10-05 DIAGNOSIS — J439 Emphysema, unspecified: Secondary | ICD-10-CM | POA: Diagnosis not present

## 2020-10-05 DIAGNOSIS — C3490 Malignant neoplasm of unspecified part of unspecified bronchus or lung: Secondary | ICD-10-CM | POA: Diagnosis not present

## 2020-10-05 NOTE — Progress Notes (Signed)
  HPI: This is a 70 year old male with a past medical history of OSA (on CPAP), hypertension, hyperlipidemia, diabetes, mild mitral regurgitation, and a robotic assisted left lower lobectomy (for T1b N0 M0 adenocarcinoma) on 08/16/2019.  He was last seen on 04/13/2020 for further lung surveillance by Dr.Lightfoot. Patient denies any shortness of breath or chest pain.  Current Outpatient Medications  Medication Sig Dispense Refill   albuterol (VENTOLIN HFA) 108 (90 Base) MCG/ACT inhaler Inhale 2 puffs into the lungs every 6 (six) hours as needed for wheezing or shortness of breath. 1 Inhaler 5   ARNUITY ELLIPTA 100 MCG/ACT AEPB INHALE 1 PUFF ONCE DAILY. 30 each 0   aspirin EC 81 MG tablet Take 1 tablet (81 mg total) by mouth daily. Swallow whole. 90 tablet 3   dapagliflozin propanediol (FARXIGA) 10 MG TABS tablet Take 1 tablet (10 mg total) by mouth daily before breakfast. 30 tablet 5   labetalol (NORMODYNE) 200 MG tablet TAKE 1 TABLET BY MOUTH TWICE DAILY. 180 tablet 3   metFORMIN (GLUCOPHAGE) 500 MG tablet TAKE TWO TABLETS BY MOUTH TWICE DAILY WITH FOOD 120 tablet 1   rosuvastatin (CRESTOR) 40 MG tablet Take 1 tablet (40 mg total) by mouth daily. 90 tablet 3   spironolactone (ALDACTONE) 25 MG tablet TAKE 1 TABLET ONCE DAILY. 90 tablet 1   telmisartan (MICARDIS) 80 MG tablet TAKE 1 TABLET ONCE DAILY. 90 tablet 3   Vitals:   10/05/20 1517  BP: 126/75  Pulse: 74  Resp: 20  SpO2: 96%      Physical Exam: CV-RRR Pulmonary-Clear to auscultation bilaterally Extremities-No LE edema Neurologic-Grossly intact without focal deficit  Diagnostic Tests:   CLINICAL DATA:  Follow-up left lung cancer, lobectomy   EXAM: CT CHEST WITHOUT CONTRAST   TECHNIQUE: Multidetector CT imaging of the chest was performed following the standard protocol without IV contrast.   COMPARISON:  07/11/2019   FINDINGS: Cardiovascular: Aortic atherosclerosis. Mild cardiomegaly. Three-vessel coronary artery  calcifications. Trace pericardial effusion.   Mediastinum/Nodes: No enlarged mediastinal, hilar, or axillary lymph nodes. Thyroid gland, trachea, and esophagus demonstrate no significant findings.   Lungs/Pleura: Status post interval left lower lobectomy. Bandlike scarring of the right lung base. No pleural effusion or pneumothorax.   Upper Abdomen: No acute abnormality.   Musculoskeletal: No chest wall mass or suspicious bone lesions identified.   IMPRESSION: 1. Status post interval left lower lobectomy. No evidence of recurrent or metastatic disease in the chest. 2. Coronary artery disease.   Aortic Atherosclerosis (ICD10-I70.0) and Emphysema (ICD10-J43.9).     Electronically Signed   By: Eddie Candle M.D.   On: 10/05/2020 09:10      Impression and Plan: Mr. Mysliwiec has no evidence of recurrent or metastatic disease on today's CT of the chest. Of note, which patient is aware, he has aortic atherosclerosis, mild cardiomegaly, and three-vessel coronary artery calcifications. Patient prefers to have next visit virtually. He will have a CT of the chest (without contrast) in 6 months and have a virtual follow up with Dr. Kipp Brood. Patient will contact the office if any problems in the interim.     Nani Skillern, PA-C [facility] [phone]

## 2020-10-05 NOTE — Telephone Encounter (Signed)
Spoke with patient today regarding prior authorization being started.

## 2020-10-05 NOTE — Telephone Encounter (Signed)
Jonathon Armstrong (Key: [id number])  Your information has been submitted to [place]. [employer] will review the request and notify you of the determination decision directly, typically within 3 business days of your submission and once all necessary information is received.  You will also receive your request decision electronically. To check for an update later, open the request again from your dashboard.  If [employer] [name] has not responded within the specified timeframe or if you have any questions about your PA submission, contact [place] directly at [facility]) [phone] or ([place]) [phone].

## 2020-10-08 DIAGNOSIS — Z23 Encounter for immunization: Secondary | ICD-10-CM | POA: Diagnosis not present

## 2020-10-10 ENCOUNTER — Telehealth: Payer: Self-pay

## 2020-10-10 NOTE — Telephone Encounter (Signed)
Called patient and left message today. I have samples up front for him to pick up until I can figure out what is going on with his medication denials.  Will look into this first thing in the morning and contact patient back.

## 2020-10-10 NOTE — Telephone Encounter (Signed)
pt has called and stated he was informed through his pharmacy that both the Jardiance and Farxiga have been denied coverage through the PA's that were sent in.  *Pt states he has not had any medication x2 weeks and is concerned cause he has some labs coming up in the next few weeks per Dr. Quay Burow to check and see if the meds were working to help him with his A1c levels.  *Sent to MA to advise.

## 2020-10-11 ENCOUNTER — Other Ambulatory Visit: Payer: Self-pay

## 2020-10-11 MED ORDER — EMPAGLIFLOZIN 10 MG PO TABS: 10.0000 mg | ORAL_TABLET | Freq: Every day | ORAL | 0 refills | Status: DC

## 2020-10-11 NOTE — Telephone Encounter (Signed)
Wilder Glade denied but Jardiance 10 covered by insurance.  Will try to appeal once denial is received.

## 2020-10-11 NOTE — Telephone Encounter (Signed)
Jardiance covered and patient informed.  He is picking up to start today.

## 2020-10-11 NOTE — Telephone Encounter (Signed)
Non-formulary exemption request form faxed int today to [employer] [place].

## 2020-10-11 NOTE — Telephone Encounter (Signed)
Script sent in for [place] today.  This medication is on covered list by Wilder Glade is not.

## 2020-10-11 NOTE — Telephone Encounter (Signed)
Check and see if it is covered.

## 2020-10-15 ENCOUNTER — Telehealth: Payer: Self-pay | Admitting: Pharmacist

## 2020-10-15 NOTE — Progress Notes (Signed)
    Chronic Care Management Pharmacy Assistant   Name: Jonathon Armstrong  MRN: [id number] DOB: 10-Jul-1950   Reason for Encounter: Disease State   Conditions to be addressed/monitored: General   Recent office visits:  09/27/20 Dr. Billey Gosling (PCP) Essential hypertension   Recent consult visits:  10/05/20 Lars Pinks PA-C Primary adenocarcinoma of lower lobe of left lung [facility]) S/P lobectomy of lung   Hospital visits:  None in previous 6 months  Medications: Outpatient Encounter Medications as of 10/15/2020  Medication Sig   albuterol (VENTOLIN HFA) 108 (90 Base) MCG/ACT inhaler Inhale 2 puffs into the lungs every 6 (six) hours as needed for wheezing or shortness of breath.   ARNUITY ELLIPTA 100 MCG/ACT AEPB INHALE 1 PUFF ONCE DAILY.   aspirin EC 81 MG tablet Take 1 tablet (81 mg total) by mouth daily. Swallow whole.   dapagliflozin propanediol (FARXIGA) 10 MG TABS tablet Take 1 tablet (10 mg total) by mouth daily before breakfast. (Patient not taking: Reported on 10/05/2020)   empagliflozin (JARDIANCE) 10 MG TABS tablet Take 1 tablet (10 mg total) by mouth daily before breakfast.   labetalol (NORMODYNE) 200 MG tablet TAKE 1 TABLET BY MOUTH TWICE DAILY.   metFORMIN (GLUCOPHAGE) 500 MG tablet TAKE TWO TABLETS BY MOUTH TWICE DAILY WITH FOOD   rosuvastatin (CRESTOR) 40 MG tablet Take 1 tablet (40 mg total) by mouth daily.   spironolactone (ALDACTONE) 25 MG tablet TAKE 1 TABLET ONCE DAILY.   telmisartan (MICARDIS) 80 MG tablet TAKE 1 TABLET ONCE DAILY.   No facility-administered encounter medications on file as of 10/15/2020.    Pharmacist Review  Have you had any problems recently with your health? Patient states that he is doing wonderful and feeling fine. Patient stated that he has not been using the Arnuity inhaler for months now, he does not have sob or a cough, his breathing is good.  Have you had any problems with your pharmacy? Patient states that he is not have ing  problems with getting medications at this time. He stated that at one time his Vania Rea and Wilder Glade was denied and was given samples. Now that the Jardiance was approved he has been taking since last week.  What issues or side effects are you having with your medications? Patient states that he does not have any side effects from medications that he knows of off hand  What would you like me to pass along to [facility] for them to help you with?  Patient states that he does not have any concerns about his health or medications at this time. He did say that he is not taking the [place] but was told to hold on to the samples he was given until they see how the Vania Rea is working.  What can we do to take care of you better?  Patient states not at this time.  Star Rating Drugs: Metformin 500 mg 10/01/20 90 ds Rosuvastatin 40 mg 09/19/20 90 ds Telmisartan 80 mg 09/19/20 90 ds Jardiance 10 mg 10/11/20 90 ds   Ethelene Hal Clinical Pharmacist Assistant [phone]   Time spent:35

## 2020-11-13 ENCOUNTER — Other Ambulatory Visit: Payer: Self-pay

## 2020-11-13 ENCOUNTER — Encounter: Payer: Self-pay | Admitting: Internal Medicine

## 2020-11-13 ENCOUNTER — Other Ambulatory Visit (INDEPENDENT_AMBULATORY_CARE_PROVIDER_SITE_OTHER): Payer: Medicare Other

## 2020-11-13 DIAGNOSIS — D649 Anemia, unspecified: Secondary | ICD-10-CM | POA: Diagnosis not present

## 2020-11-13 DIAGNOSIS — E1169 Type 2 diabetes mellitus with other specified complication: Secondary | ICD-10-CM | POA: Diagnosis not present

## 2020-11-13 DIAGNOSIS — E1165 Type 2 diabetes mellitus with hyperglycemia: Secondary | ICD-10-CM

## 2020-11-13 LAB — HEMOGLOBIN A1C: Hgb A1c MFr Bld: 7.7 % — ABNORMAL HIGH (ref 4.6–6.5)

## 2020-11-13 LAB — CBC WITH DIFFERENTIAL/PLATELET
Basophils Absolute: 0 10*3/uL (ref 0.0–0.1)
Basophils Relative: 0.4 % (ref 0.0–3.0)
Eosinophils Absolute: 0.1 10*3/uL (ref 0.0–0.7)
Eosinophils Relative: 1.3 % (ref 0.0–5.0)
HCT: 32.5 % — ABNORMAL LOW (ref 39.0–52.0)
Hemoglobin: 11.5 g/dL — ABNORMAL LOW (ref 13.0–17.0)
Lymphocytes Relative: 26.4 % (ref 12.0–46.0)
Lymphs Abs: 1.5 10*3/uL (ref 0.7–4.0)
MCHC: 35.3 g/dL (ref 30.0–36.0)
MCV: 87.4 fl (ref 78.0–100.0)
Monocytes Absolute: 0.4 10*3/uL (ref 0.1–1.0)
Monocytes Relative: 7.6 % (ref 3.0–12.0)
Neutro Abs: 3.7 10*3/uL (ref 1.4–7.7)
Neutrophils Relative %: 64.3 % (ref 43.0–77.0)
Platelets: 136 10*3/uL — ABNORMAL LOW (ref 150.0–400.0)
RBC: 3.73 Mil/uL — ABNORMAL LOW (ref 4.22–5.81)
RDW: 13.9 % (ref 11.5–15.5)
WBC: 5.7 10*3/uL (ref 4.0–10.5)

## 2020-11-14 LAB — IRON,TIBC AND FERRITIN PANEL
%SAT: 20 % (calc) (ref 20–48)
Ferritin: 75 ng/mL (ref 24–380)
Iron: 73 ug/dL (ref 50–180)
TIBC: 370 mcg/dL (calc) (ref 250–425)

## 2020-11-15 MED ORDER — EMPAGLIFLOZIN 25 MG PO TABS: 25.0000 mg | ORAL_TABLET | Freq: Every day | ORAL | 1 refills | Status: DC

## 2020-11-29 ENCOUNTER — Other Ambulatory Visit: Payer: Self-pay | Admitting: Internal Medicine

## 2020-12-10 ENCOUNTER — Other Ambulatory Visit: Payer: Self-pay

## 2020-12-10 ENCOUNTER — Ambulatory Visit (INDEPENDENT_AMBULATORY_CARE_PROVIDER_SITE_OTHER): Payer: Medicare Other | Admitting: Pharmacist

## 2020-12-10 DIAGNOSIS — I2510 Atherosclerotic heart disease of native coronary artery without angina pectoris: Secondary | ICD-10-CM

## 2020-12-10 DIAGNOSIS — D649 Anemia, unspecified: Secondary | ICD-10-CM

## 2020-12-10 DIAGNOSIS — E1165 Type 2 diabetes mellitus with hyperglycemia: Secondary | ICD-10-CM

## 2020-12-10 DIAGNOSIS — E782 Mixed hyperlipidemia: Secondary | ICD-10-CM

## 2020-12-10 NOTE — Patient Instructions (Signed)
Visit Information  Phone number for Pharmacist: [phone]   Goals Addressed             This Visit's Progress    Eat Healthy       Timeframe:  Long-Range Goal Priority:  High Start Date:     12/10/20                        Expected End Date:  12/10/21                     Follow Up Date March 2023   - change to whole grain breads, cereal, pasta - drink 6 to 8 glasses of water each day - fill half of plate with vegetables - manage portion size - read food labels for fat, fiber, carbohydrates and portion size - switch to sugar-free drinks    Why is this important?   When you are ready to manage your nutrition or weight, having a plan and setting goals will help.  Taking small steps to change how you eat and exercise is a good place to start.    Notes:      COMPLETED: Track and Manage My Symptoms-Asthma       Timeframe:  Long-Range Goal Priority:  Medium Start Date:          06/27/20                   Expected End Date:    06/27/21                   Follow Up Date 01/20/21   - avoid symptom triggers outdoors - begin a symptom diary - eliminate symptom triggers at home - keep rescue medicines on hand  -Trial time off of Arnuity when prescription runs out. Monitor breathing / coughing and frequency of Albuterol use; if symptoms worsen off of Arnuity, contact Dr Quay Burow   Why is this important?   Keeping track of asthma symptoms can tell you a lot about your asthma control.  Based on symptoms and peak flow results you can see how well you are doing.  Your asthma action plan has a green, yellow and red zone. Green means all is good; it is your goal. Yellow means your symptoms are a little worse. You will need to adjust your medications. Being in the red zone means that your   asthma is out of control. You will need to use your rescue medicines. You may need emergency care.     Notes:         Care Plan : [place]  Updates made by Charlton Haws, RPH  since 12/10/2020 12:00 AM     Problem: Hypertension, Hyperlipidemia, Diabetes, Coronary Artery Disease and Asthma   Priority: High     Long-Range Goal: Disease management   Start Date: 06/27/2020  Expected End Date: 06/27/2021  This Visit's Progress: On track  Recent Progress: On track  Priority: High  Note:   Current Barriers:  Unable to independently monitor therapeutic efficacy  Pharmacist Clinical Goal(s):  Patient will achieve adherence to monitoring guidelines and medication adherence to achieve therapeutic efficacy through collaboration with PharmD and provider.   Interventions: 1:1 collaboration with [name], MD regarding development and update of comprehensive plan of care as evidenced by provider attestation and co-signature Inter-disciplinary care team collaboration (see longitudinal plan of care) Comprehensive medication review performed; medication list updated in  electronic medical record  Hypertension (BP goal <130/80) -Controlled - BP in office and at home is at goal; pt endorses compliance and denies side effects -Current home readings: 120s/70s-80s -Current treatment: Labetalol 200 mg BID Spironolactone 25 mg daily Telmisartan 80 mg daily -Current dietary habits: limits fried foods, bread, salt -Current exercise habits: walking -Denies hypotensive/hypertensive symptoms -Educated on BP goals and benefits of medications for prevention of heart attack, stroke and kidney damage; -Counseled to monitor BP at home periodically -Recommended to continue current medication  Hyperlipidemia: (LDL goal < 70) -Controlled - LDL is now at goal after change from Vytorin to rosuvastatin; pt endorses compliance with statin and denies issues -Current treatment: Rosuvastatin 40 mg daily Aspirin 81 mg daily -Medications previously tried: Vytorin -Educated on Cholesterol goals; Benefits of statin for ASCVD risk reduction; -Recommended to continue current  medication  Diabetes (A1c goal <7%) -Not ideally controlled - A1c is above goal (7.7%) and Jardiance was increased to 25 mg a few weeks ago; pt reports significant urinary frequency with Jardiance; he is planning to hold Jardiance on days he does not have easy access to a bathroom -Current medications: Metformin 500 mg - 2 tab BID Jardiance 25 mg daily -Counseled on mechanism of Jardiance; advised that limiting carbs/sugar in diet will help with urination side effect  -Recommended to continue current medication  Asthma (Goal: control symptoms and prevent exacerbations) -Controlled - Pt has been off Arnuity for a few months and denies issues; he was initially prescribed Arnuity in aftermath of URI and he does not think he has asthma; he has used albuterol a handful of times since receiving it -hx OSA, lung cancer s/p LLL resection -Current treatment  Arnuity Ellipta 100 mcg/act 1 puff daily - not taking Albuterol HFA prn -Removed Arnuity from med list - not needed -Advised pt to keep albuterol for PRN use  Health Maintenance -Vaccine gaps: Shingrix, TDAP, flu, covid booster -Pt reports he did get 2nd covid booster in June or July 2022; he is planning to get next booster soon, along with flu shot and Shingrix   Patient Goals/Self-Care Activities Patient will:  - take medications as prescribed -focus on medication adherence by routine -check blood pressure weekly -engage in dietary modifications by reducing carbs/sugar -Get Flu vaccine, covid booster and Shingrix vaccines       Patient verbalizes understanding of instructions provided today and agrees to view in [place].  Telephone follow up appointment with pharmacy team member scheduled for: 6 months  Charlene Brooke, PharmD, [place], CPP Clinical Pharmacist [facility] [phone]

## 2020-12-10 NOTE — Progress Notes (Signed)
Chronic Care Management Pharmacy Note  12/10/2020 Name:  Jonathon Armstrong MRN:  [id number] DOB:  1951-02-16  Summary: -Pt reports frequent urination with Jardiance; he is planning to hold it for a week while on vacation w/o easy access to a bathroom  Recommendations/Changes made from today's visit: -Counseled on limiting carbs/sugar in diet to limit urination side effect with Jardiance   Subjective: Jonathon Armstrong is an 70 y.o. year old male who is a primary patient of Burns, Claudina Lick, MD.  The CCM team was consulted for assistance with disease management and care coordination needs.    Engaged with patient by telephone for follow up visit in response to provider referral for pharmacy case management and/or care coordination services.   Consent to Services:  The patient was given information about Chronic Care Management services, agreed to services, and gave verbal consent prior to initiation of services.  Please see initial visit note for detailed documentation.   Patient Care Team: [name], MD as PCP - General (Internal Medicine) Stanford Breed Denice Bors, MD as PCP - Cardiology (Cardiology) Charlton Haws, [facility] as Pharmacist (Pharmacist) [place], [place], [place] as Consulting Physician (Optometry)   Patient lives at home with wife of 2 years. He has 2 children and 5 grandchildren living in [place], he is a very active grandparent and visits often. He also volunteers in his church.  Recent office visits: 09/27/20 Dr Quay Burow OV: chronic f/u; A1c 7.7. Started Jardiance 10 mg. Increased Jardiance to 25 mg 8/25 in pt message.  05/31/20 Dr Quay Burow VV: sinusitis, rx'd z-pak and tussionex.  01/03/20 Dr Quay Burow OV: chronic f/u. Trig elevated, work on wt loss. No med changes.  Recent consult visits: 10/05/20 Dr Tacy Dura (CT surgery): no evidence of recurrent disease on CT chest.  06/21/20 Dr Stanford Breed (cardiology): f/u HLD, CAD. Coronary CT Ca score 03/2019  1368. D/C vytorin and started rosuvastatin 40 + ASA 81 mg. F/u 3 months for lipid panel.   04/13/20 Dr Lowella Dell (CT surgery): f/u LL lobectomy 07/2019. No issues. No changes.  Dr Ronnald Ramp (dermatology): f/u seborrheic keratosis  Hospital visits: None in previous 6 months  Objective:  Lab Results  Component Value Date   CREATININE 1.18 09/27/2020   BUN 21 09/27/2020   GFR 62.23 09/27/2020   GFRNONAA 47 (L) 08/18/2019   GFRAA 54 (L) 08/18/2019   NA 138 09/27/2020   K 4.4 09/27/2020   CALCIUM 10.0 09/27/2020   CO2 26 09/27/2020   GLUCOSE 211 (H) 09/27/2020    Lab Results  Component Value Date/Time   HGBA1C 7.7 (H) 11/13/2020 08:57 AM   HGBA1C 7.7 (H) 09/27/2020 09:46 AM   GFR 62.23 09/27/2020 09:46 AM   GFR 62.60 01/03/2020 09:25 AM   MICROALBUR 1.3 04/30/2016 10:55 AM   MICROALBUR <0.7 09/18/2014 09:16 AM    Last diabetic Eye exam:  Lab Results  Component Value Date/Time   HMDIABEYEEXA No Retinopathy 01/04/2020 12:00 AM    Last diabetic Foot exam: No results found for: HMDIABFOOTEX   Lab Results  Component Value Date   CHOL 99 (L) 09/20/2020   HDL 34 (L) 09/20/2020   LDLCALC 32 09/20/2020   LDLDIRECT 76.0 01/03/2020   TRIG 209 (H) 09/20/2020   CHOLHDL 2.9 09/20/2020    Hepatic Function Latest Ref Rng & Units 09/20/2020 01/03/2020 08/18/2019  Total Protein 6.0 - 8.5 g/dL 6.7 7.2 6.3(L)  Albumin 3.7 - 4.7 g/dL 4.8(H) 4.7 3.6  AST 0 - 40 IU/L  '20 15 19  ' ALT 0 - 44 IU/L '23 16 19  ' Alk Phosphatase 44 - 121 IU/L 75 70 59  Total Bilirubin 0.0 - 1.2 mg/dL 0.3 0.5 0.6  Bilirubin, Direct 0.00 - 0.40 mg/dL 0.11 - -    Lab Results  Component Value Date/Time   TSH 1.41 11/12/2018 09:34 AM   TSH 1.42 07/24/2017 09:02 AM    CBC Latest Ref Rng & Units 11/13/2020 09/27/2020 01/03/2020  WBC 4.0 - 10.5 K/uL 5.7 5.1 6.6  Hemoglobin 13.0 - 17.0 g/dL 11.5(L) 11.6(L) 12.9(L)  Hematocrit 39.0 - 52.0 % 32.5(L) 33.2(L) 37.2(L)  Platelets 150.0 - 400.0 K/uL 136.0(L) 131.0(L) 160.0     No results found for: VD25OH  Clinical ASCVD: Yes  The ASCVD Risk score (Arnett DK, et al., 2019) failed to calculate for the following reasons:   The valid total cholesterol range is 130 to 320 mg/dL    Depression screen [facility] 2/9 05/28/2020 05/17/2019 03/02/2018  Decreased Interest 0 0 0  Down, Depressed, Hopeless 0 0 0  PHQ - 2 Score 0 0 0  Altered sleeping - - -  Tired, decreased energy - - -  Change in appetite - - -  Feeling bad or failure about yourself  - - -  Trouble concentrating - - -  Moving slowly or fidgety/restless - - -  Suicidal thoughts - - -  PHQ-9 Score - - -     Social History   Tobacco Use  Smoking Status Never  Smokeless Tobacco Never   BP Readings from Last 3 Encounters:  10/05/20 126/75  09/27/20 122/76  06/21/20 124/74   Pulse Readings from Last 3 Encounters:  10/05/20 74  09/27/20 65  06/21/20 73   Wt Readings from Last 3 Encounters:  10/05/20 220 lb (99.8 kg)  09/27/20 221 lb (100.2 kg)  06/21/20 213 lb 6.4 oz (96.8 kg)   BMI Readings from Last 3 Encounters:  10/05/20 31.57 kg/m  09/27/20 31.71 kg/m  06/21/20 30.62 kg/m    Assessment/Interventions: Review of patient past medical history, allergies, medications, health status, including review of consultants reports, laboratory and other test data, was performed as part of comprehensive evaluation and provision of chronic care management services.   SDOH:  (Social Determinants of Health) assessments and interventions performed: Yes  SDOH Screenings   Alcohol Screen: Low Risk    Last Alcohol Screening Score (AUDIT): 3  Depression (PHQ2-9): Low Risk    PHQ-2 Score: 0  Financial Resource Strain: Low Risk    Difficulty of Paying Living Expenses: Not hard at all  Food Insecurity: No Food Insecurity   Worried About Charity fundraiser in the Last Year: Never true   Ran Out of Food in the Last Year: Never true  Housing: Low Risk    Last Housing Risk Score: 0  Physical Activity:  Sufficiently Active   Days of Exercise per Week: 5 days   Minutes of Exercise per Session: 30 min  Social Connections: Engineer, building services of Communication with Friends and Family: More than three times a week   Frequency of Social Gatherings with Friends and Family: More than three times a week   Attends Religious Services: More than 4 times per year   Active Member of [employer] or Organizations: Yes   Attends Music therapist: More than 4 times per year   Marital Status: Married  Stress: No Stress Concern Present   Feeling of Stress : Not at all  Tobacco  Use: Low Risk    Smoking Tobacco Use: Never   Smokeless Tobacco Use: Never  Transportation Needs: No Transportation Needs   Lack of Transportation (Medical): No   Lack of Transportation (Non-Medical): No    CCM Care Plan  Allergies  Allergen Reactions   Diltiazem Hcl Other (See Comments) and Cough    ? symptoms   Penicillins     Rash with Amoxicillin with Mono in college    Medications Reviewed Today     Reviewed by Charlton Haws, [facility] (Pharmacist) on 12/10/20 at 63  Med List Status: <None>   Medication Order Taking? Sig Documenting Provider Last Dose Status Informant  albuterol (VENTOLIN HFA) 108 (90 Base) MCG/ACT inhaler [id number] Yes Inhale 2 puffs into the lungs every 6 (six) hours as needed for wheezing or shortness of breath. Chesley Mires, MD Taking Active Self  aspirin EC 81 MG tablet [id number] Yes Take 1 tablet (81 mg total) by mouth daily. Swallow whole. Lelon Perla, MD Taking Active   empagliflozin (JARDIANCE) 25 MG TABS tablet [id number] Yes Take 1 tablet (25 mg total) by mouth daily before breakfast. [name], MD Taking Active   labetalol (NORMODYNE) 200 MG tablet [id number] Yes TAKE 1 TABLET BY MOUTH TWICE DAILY. Lelon Perla, MD Taking Active   metFORMIN (GLUCOPHAGE) 500 MG tablet [id number] Yes TAKE TWO TABLETS BY MOUTH TWICE DAILY WITH FOOD Burns, Claudina Lick, MD Taking  Active   rosuvastatin (CRESTOR) 40 MG tablet [id number]  Take 1 tablet (40 mg total) by mouth daily. Lelon Perla, MD  Expired 09/19/20 2359   spironolactone (ALDACTONE) 25 MG tablet [id number] Yes TAKE 1 TABLET ONCE DAILY. [name], MD Taking Active   telmisartan (MICARDIS) 80 MG tablet [id number] Yes TAKE 1 TABLET ONCE DAILY. Lelon Perla, MD Taking Active             Patient Active Problem List   Diagnosis Date Noted   Primary adenocarcinoma of lower lobe of left lung ([place]) 09/16/2019   Diarrhea 09/16/2019   S/P lobectomy of lung 08/16/2019   Pain and swelling of right lower leg 02/24/2019   Chronic Eustachian tube dysfunction, right 01/18/2018   Bilateral hearing loss 01/18/2018   Cough 06/15/2015   Diabetes ([place]) 02/25/2013   Hyperlipidemia 12/05/2010   NONSPECIFIC ABNORMAL ELECTROCARDIOGRAM 08/13/2009   Hyperuricemia 02/07/2008   DIVERTICULOSIS, COLON 10/08/2007   History of colonic polyps 10/08/2007   Essential hypertension 03/26/2007   ASYSTOLE 02/22/2007   Obstructive sleep apnea 08/28/2006    Immunization History  Administered Date(s) Administered   Fluad Quad(high Dose 65+) 11/12/2018, 01/03/2020   Influenza Split 01/10/2011   Influenza Whole 02/18/2010   Influenza, High Dose Seasonal PF 12/25/2016, 01/04/2018   Influenza-Unspecified 01/05/2013, 01/23/2015, 02/06/2016   PFIZER(Purple Top)SARS-COV-2 Vaccination 04/18/2019, 05/09/2019, 02/04/2020   Pneumococcal Conjugate-13 03/28/2015   Pneumococcal Polysaccharide-23 04/30/2016   Tdap 03/24/2004    Conditions to be addressed/monitored:  Hypertension, Hyperlipidemia, Diabetes, Coronary Artery Disease and Asthma  Care Plan : [place]  Updates made by Charlton Haws, [place] since 12/10/2020 12:00 AM     Problem: Hypertension, Hyperlipidemia, Diabetes, Coronary Artery Disease and Asthma   Priority: High     Long-Range Goal: Disease management   Start Date: 06/27/2020  Expected  End Date: 06/27/2021  This Visit's Progress: On track  Recent Progress: On track  Priority: High  Note:   Current Barriers:  Unable to independently monitor therapeutic efficacy  Pharmacist Clinical Goal(s):  Patient  will achieve adherence to monitoring guidelines and medication adherence to achieve therapeutic efficacy through collaboration with PharmD and provider.   Interventions: 1:1 collaboration with [name], MD regarding development and update of comprehensive plan of care as evidenced by provider attestation and co-signature Inter-disciplinary care team collaboration (see longitudinal plan of care) Comprehensive medication review performed; medication list updated in electronic medical record  Hypertension (BP goal <130/80) -Controlled - BP in office and at home is at goal; pt endorses compliance and denies side effects -Current home readings: 120s/70s-80s -Current treatment: Labetalol 200 mg BID Spironolactone 25 mg daily Telmisartan 80 mg daily -Current dietary habits: limits fried foods, bread, salt -Current exercise habits: walking -Denies hypotensive/hypertensive symptoms -Educated on BP goals and benefits of medications for prevention of heart attack, stroke and kidney damage; -Counseled to monitor BP at home periodically -Recommended to continue current medication  Hyperlipidemia: (LDL goal < 70) -Controlled - LDL is now at goal after change from Vytorin to rosuvastatin; pt endorses compliance with statin and denies issues -Current treatment: Rosuvastatin 40 mg daily Aspirin 81 mg daily -Medications previously tried: Vytorin -Educated on Cholesterol goals; Benefits of statin for ASCVD risk reduction; -Recommended to continue current medication  Diabetes (A1c goal <7%) -Not ideally controlled - A1c is above goal (7.7%) and Jardiance was increased to 25 mg a few weeks ago; pt reports significant urinary frequency with Jardiance; he is planning to hold  Jardiance on days he does not have easy access to a bathroom -Current medications: Metformin 500 mg - 2 tab BID Jardiance 25 mg daily -Counseled on mechanism of Jardiance; advised that limiting carbs/sugar in diet will help with urination side effect  -Recommended to continue current medication  Asthma (Goal: control symptoms and prevent exacerbations) -Controlled - Pt has been off Arnuity for a few months and denies issues; he was initially prescribed Arnuity in aftermath of URI and he does not think he has asthma; he has used albuterol a handful of times since receiving it -hx OSA, lung cancer s/p LLL resection -Current treatment  Arnuity Ellipta 100 mcg/act 1 puff daily - not taking Albuterol HFA prn -Removed Arnuity from med list - not needed -Advised pt to keep albuterol for PRN use  Health Maintenance -Vaccine gaps: Shingrix, TDAP, flu, covid booster -Pt reports he did get 2nd covid booster in June or July 2022; he is planning to get next booster soon, along with flu shot and Shingrix   Patient Goals/Self-Care Activities Patient will:  - take medications as prescribed -focus on medication adherence by routine -check blood pressure weekly -engage in dietary modifications by reducing carbs/sugar -Get Flu vaccine, covid booster and Shingrix vaccines        Compliance/Adherence/Medication fill history: Care Gaps: Foot exam (03/03/19)  Star-Rating Drugs: Jardiance - LF 11/16/20 x 30 ds Rosuvastatin - LF 09/19/20 x 90 ds Telmisartan - LF 09/19/20 x 90 ds Metformin - LF 10/31/20 x 30 ds  Medication Assistance: None required.  Patient affirms current coverage meets needs.  Patient's preferred pharmacy is:  [facility], [place] [address] Phone: [phone] Fax: [phone]  Uses pill box? No - prefers bottles Pt endorses 100% compliance  We discussed: Current pharmacy is  preferred with insurance plan and patient is satisfied with pharmacy services Patient decided to: Continue current medication management strategy  Care Plan and Follow Up Patient Decision:  Patient agrees to Care Plan and  Follow-up.  Plan: Telephone follow up appointment with care management team member scheduled for:  6 months  Charlene Brooke, PharmD, [place], CPP Clinical Pharmacist [facility] [phone]

## 2020-12-21 DIAGNOSIS — E782 Mixed hyperlipidemia: Secondary | ICD-10-CM

## 2020-12-21 DIAGNOSIS — E1165 Type 2 diabetes mellitus with hyperglycemia: Secondary | ICD-10-CM

## 2020-12-21 DIAGNOSIS — D649 Anemia, unspecified: Secondary | ICD-10-CM | POA: Diagnosis not present

## 2020-12-21 DIAGNOSIS — I2510 Atherosclerotic heart disease of native coronary artery without angina pectoris: Secondary | ICD-10-CM | POA: Diagnosis not present

## 2020-12-21 DIAGNOSIS — I2584 Coronary atherosclerosis due to calcified coronary lesion: Secondary | ICD-10-CM | POA: Diagnosis not present

## 2021-01-24 ENCOUNTER — Telehealth (INDEPENDENT_AMBULATORY_CARE_PROVIDER_SITE_OTHER): Payer: Medicare Other | Admitting: Family Medicine

## 2021-01-24 ENCOUNTER — Encounter: Payer: Self-pay | Admitting: Family Medicine

## 2021-01-24 DIAGNOSIS — I2510 Atherosclerotic heart disease of native coronary artery without angina pectoris: Secondary | ICD-10-CM

## 2021-01-24 DIAGNOSIS — I2584 Coronary atherosclerosis due to calcified coronary lesion: Secondary | ICD-10-CM | POA: Diagnosis not present

## 2021-01-24 DIAGNOSIS — R059 Cough, unspecified: Secondary | ICD-10-CM

## 2021-01-24 MED ORDER — AZITHROMYCIN 250 MG PO TABS: ORAL_TABLET | ORAL | 0 refills | Status: DC

## 2021-01-24 MED ORDER — BENZONATATE 100 MG PO CAPS: ORAL_CAPSULE | ORAL | 0 refills | Status: DC

## 2021-01-24 NOTE — Progress Notes (Signed)
Virtual Visit via Video Note  I connected with Cecilie Lowers  on 01/24/21 at 10:40 AM EDT by a video enabled telemedicine application and verified that I am speaking with the correct person using two identifiers.  Location patient: [place] Location provider:work or home office Persons participating in the virtual visit: patient, provider  I discussed the limitations of evaluation and management by telemedicine and the availability of in person appointments. The patient expressed understanding and agreed to proceed.   HPI:  Acute telemedicine visit for cough and sinus issues: -Onset: 3 days ago -one negative covid test this morning -he is worried about getting pneumonia and flu because of a history of partial lung resection for adenocarcinoma and wants a zpack and a cough medication (as this has helped in the past with these symptoms) -Symptoms include: nasal congestion, cough -Denies: fever, CP, SOB, NVD, body aches -Pertinent past medical history: see below -Pertinent medication allergies:  Allergies  Allergen Reactions   Diltiazem Hcl Other (See Comments) and Cough    ? symptoms   Penicillins     Rash with Amoxicillin with Mono in college  -COVID-19 vaccine status: Immunization History  Administered Date(s) Administered   Fluad Quad(high Dose 65+) 11/12/2018, 01/03/2020   Influenza Split 01/10/2011   Influenza Whole 02/18/2010   Influenza, High Dose Seasonal PF 12/25/2016, 01/04/2018   Influenza-Unspecified 01/05/2013, 01/23/2015, 02/06/2016   PFIZER(Purple Top)SARS-COV-2 Vaccination 04/18/2019, 05/09/2019, 02/04/2020   Pfizer Covid-19 Vaccine Bivalent Booster 32yrs & up 10/08/2020   Pneumococcal Conjugate-13 03/28/2015   Pneumococcal Polysaccharide-23 04/30/2016   Tdap 03/24/2004    ROS: See pertinent positives and negatives per HPI.  Past Medical History:  Diagnosis Date   Allergy    seasonal   Asthma 06-11-2017   Asystole ([place])    "vagal response"post nasal polypectomy    Complication of anesthesia Jun 11, 2004   Coded x2 after nasal polyp removal (Believes vagal nerve response)   Diabetes mellitus without complication ([place])    pre but take metformin   Diverticulosis of colon    FH: colonic polyps    hx of   Hyperlipidemia    Hypertension    Mitral regurgitation    mild & aortic Valve calcification on 2d echo;sbe prophylaxis   Parotitis, acute June 12, 2011   Seasonal rhinitis    Sleep apnea    CPAP    Past Surgical History:  Procedure Laterality Date   APPENDECTOMY     asytole post nasal polypectomy  06/12/06   colonoscopy with polypectomy  06/11/2008   Dr Olevia Perches   heriorraphy     x2   [place] Left 08/16/2019   Procedure: Intercostal Nerve Block;  Surgeon: Lajuana Matte, MD;  Location: [facility];  Service: Thoracic;  Laterality: Left;   MIDDLE EAR SURGERY     TM replacement   NASAL SINUS [place], [id number]   X3   NODE DISSECTION  08/16/2019   Procedure: Node Dissection;  Surgeon: Lajuana Matte, MD;  Location: [facility];  Service: Thoracic;;   TONSILLECTOMY AND ADENOIDECTOMY     VIDEO BRONCHOSCOPY N/A 08/16/2019   Procedure: VIDEO BRONCHOSCOPY;  Surgeon: Lajuana Matte, MD;  Location: [facility];  Service: Thoracic;  Laterality: N/A;     Current Outpatient Medications:    albuterol (VENTOLIN HFA) 108 (90 Base) MCG/ACT inhaler, Inhale 2 puffs into the lungs every 6 (six) hours as needed for wheezing or shortness of breath., Disp: 1 Inhaler, Rfl: 5   aspirin EC  81 MG tablet, Take 1 tablet (81 mg total) by mouth daily. Swallow whole., Disp: 90 tablet, Rfl: 3   azithromycin (ZITHROMAX) 250 MG tablet, 2 tabs day 1, then one tab daily, Disp: 6 tablet, Rfl: 0   benzonatate (TESSALON PERLES) 100 MG capsule, 1-2 capsules up to twice daily, Disp: 30 capsule, Rfl: 0   empagliflozin (JARDIANCE) 25 MG TABS tablet, Take 1 tablet (25 mg total) by mouth daily before breakfast., Disp: 90 tablet, Rfl: 1   labetalol (NORMODYNE) 200 MG  tablet, TAKE 1 TABLET BY MOUTH TWICE DAILY., Disp: 180 tablet, Rfl: 3   metFORMIN (GLUCOPHAGE) 500 MG tablet, TAKE TWO TABLETS BY MOUTH TWICE DAILY WITH FOOD, Disp: 120 tablet, Rfl: 1   spironolactone (ALDACTONE) 25 MG tablet, TAKE 1 TABLET ONCE DAILY., Disp: 90 tablet, Rfl: 1   telmisartan (MICARDIS) 80 MG tablet, TAKE 1 TABLET ONCE DAILY., Disp: 90 tablet, Rfl: 3   rosuvastatin (CRESTOR) 40 MG tablet, Take 1 tablet (40 mg total) by mouth daily., Disp: 90 tablet, Rfl: 3  EXAM:  VITALS per patient if applicable: 767/34, T 19.3  GENERAL: alert, oriented, appears well and in no acute distress  HEENT: atraumatic, conjunttiva clear, no obvious abnormalities on inspection of external nose and ears  NECK: normal movements of the head and neck  LUNGS: on inspection no signs of respiratory distress, breathing rate appears normal, no obvious gross SOB, gasping or wheezing  CV: no obvious cyanosis  MS: moves all visible extremities without noticeable abnormality  PSYCH/NEURO: pleasant and cooperative, no obvious depression or anxiety, speech and thought processing grossly intact  ASSESSMENT AND PLAN:  Discussed the following assessment and plan:  Cough, unspecified type  -we discussed possible serious and likely etiologies, options for evaluation and workup, limitations of telemedicine visit vs in person visit, treatment, treatment risks and precautions. Pt is agreeable to treatment via telemedicine at this moment. He is agreeable to retesting fro covid in 48 hours (let him know about VV f/u or [place] pharmacy if positive for antiviral), symptomatic care and tessalon for cough with delayed abx  if needed if worsening or not improving as expected. Advised to seek prompt in person care if worsening, new symptoms arise, or if is not improving with treatment. Discussed options for inperson care if PCP office not available. Did let this patient know that I only do telemedicine on Tuesdays and  Thursdays for [place]. Advised to schedule follow up visit with PCP or [facility] if any further questions or concerns to avoid delays in care.   I discussed the assessment and treatment plan with the patient. The patient was provided an opportunity to ask questions and all were answered. The patient agreed with the plan and demonstrated an understanding of the instructions.     Lucretia Kern, DO

## 2021-01-24 NOTE — Patient Instructions (Addendum)
  HOME CARE TIPS:  -COVID19 testing information: [email or web address]  Most pharmacies also offer testing and home test kits. If the Covid19 test is positive and you desire antiviral treatment, please contact a [facility] or schedule a follow up virtual visit through your primary care office or through the [employer] [facility].  Other test to treat options: ConnectRV.is?[email or web address]  -I sent the medication(s) we discussed to your pharmacy: Meds ordered this encounter  Medications   azithromycin (ZITHROMAX) 250 MG tablet    Sig: 2 tabs day 1, then one tab daily    Dispense:  6 tablet    Refill:  0   benzonatate (TESSALON PERLES) 100 MG capsule    Sig: 1-2 capsules up to twice daily    Dispense:  30 capsule    Refill:  0   -start with nasal saline twice daily and tessalon for cough along with retesting for covid in 48 hours as we discussed  -the azithromycin is an antibiotic and can be used to treat some bacterial respiratory infections, but as we discussed, is not recommended for viral infections  -can use tylenol if needed for fevers, aches and pains per instructions  -can use nasal saline a few times per day if you have nasal congestion  -stay hydrated, drink plenty of fluids and eat small healthy meals - avoid dairy  -follow up with your doctor in 2-3 days unless improving and feeling better  It was nice to meet you today, and I really hope you are feeling better soon. I help [facility] out with telemedicine visits on Tuesdays and Thursdays and am available for visits on those days. If you have any concerns or questions following this visit please schedule a follow up visit with your Primary Care doctor or seek care at a local urgent care clinic to avoid delays in care.    Seek in person care or schedule a follow up video visit promptly if your symptoms worsen, new concerns arise or you are not improving with treatment.  Call 911 and/or seek emergency care if your symptoms are severe or life threatening.

## 2021-01-28 ENCOUNTER — Other Ambulatory Visit: Payer: Self-pay | Admitting: Internal Medicine

## 2021-02-04 ENCOUNTER — Other Ambulatory Visit: Payer: Self-pay | Admitting: Internal Medicine

## 2021-02-06 DIAGNOSIS — Z23 Encounter for immunization: Secondary | ICD-10-CM | POA: Diagnosis not present

## 2021-02-25 ENCOUNTER — Other Ambulatory Visit: Payer: Self-pay | Admitting: Thoracic Surgery (Cardiothoracic Vascular Surgery)

## 2021-02-25 DIAGNOSIS — R911 Solitary pulmonary nodule: Secondary | ICD-10-CM

## 2021-02-25 DIAGNOSIS — Z902 Acquired absence of lung [part of]: Secondary | ICD-10-CM

## 2021-03-01 ENCOUNTER — Other Ambulatory Visit: Payer: Self-pay

## 2021-03-01 ENCOUNTER — Ambulatory Visit (HOSPITAL_COMMUNITY)
Admission: EM | Admit: 2021-03-01 | Discharge: 2021-03-01 | Disposition: A | Discharge status: Home / Self Care | Payer: Medicare Other | Attending: Emergency Medicine | Admitting: Emergency Medicine

## 2021-03-01 ENCOUNTER — Encounter (HOSPITAL_COMMUNITY): Payer: Self-pay | Admitting: Emergency Medicine

## 2021-03-01 DIAGNOSIS — H109 Unspecified conjunctivitis: Secondary | ICD-10-CM

## 2021-03-01 MED ORDER — POLYMYXIN B-TRIMETHOPRIM 10000-0.1 UNIT/ML-% OP SOLN: 1.0000 [drp] | OPHTHALMIC | 0 refills | Status: DC

## 2021-03-01 NOTE — ED Triage Notes (Signed)
Pt reports around noon had redness to right eye with drainage,

## 2021-03-01 NOTE — ED Provider Notes (Signed)
[place]    CSN: [id number] Arrival date & time: 03/01/21  1725      History   Chief Complaint Chief Complaint  Patient presents with   Eye Drainage    HPI Jonathon Armstrong is a 70 y.o. male.  Patient reports mild nasal congestion for the last several days.  Has been using saline irrigation with Neti pot and antihistamine to manage it.  Does not feel he has a sinus infection or a URI.  Today at noon noticed eye discharge that has progressively worsened over the course of the day from the right eye.  Eye is red, drainage is purulent.  HPI  Past Medical History:  Diagnosis Date   Allergy    seasonal   Asthma 06-12-2017   Asystole ([place])    "vagal response"post nasal polypectomy   Complication of anesthesia 12-Jun-2004   Coded x2 after nasal polyp removal (Believes vagal nerve response)   Diabetes mellitus without complication ([place])    pre but take metformin   Diverticulosis of colon    FH: colonic polyps    hx of   Hyperlipidemia    Hypertension    Mitral regurgitation    mild & aortic Valve calcification on 2d echo;sbe prophylaxis   Parotitis, acute June 13, 2011   Seasonal rhinitis    Sleep apnea    CPAP    Patient Active Problem List   Diagnosis Date Noted   Primary adenocarcinoma of lower lobe of left lung ([place]) 09/16/2019   Diarrhea 09/16/2019   S/P lobectomy of lung 08/16/2019   Pain and swelling of right lower leg 02/24/2019   Chronic Eustachian tube dysfunction, right 01/18/2018   Bilateral hearing loss 01/18/2018   Cough 06/15/2015   Diabetes ([place]) 02/25/2013   Hyperlipidemia 12/05/2010   NONSPECIFIC ABNORMAL ELECTROCARDIOGRAM 08/13/2009   Hyperuricemia 02/07/2008   DIVERTICULOSIS, COLON 10/08/2007   History of colonic polyps 10/08/2007   Essential hypertension 03/26/2007   ASYSTOLE 02/22/2007   Obstructive sleep apnea 08/28/2006    Past Surgical History:  Procedure Laterality Date   APPENDECTOMY     asytole post nasal polypectomy  06-12-2006    colonoscopy with polypectomy  06-12-08   Dr Olevia Perches   heriorraphy     x2   [place] Left 08/16/2019   Procedure: Intercostal Nerve Block;  Surgeon: Lajuana Matte, MD;  Location: [facility];  Service: Thoracic;  Laterality: Left;   MIDDLE EAR SURGERY     TM replacement   NASAL SINUS [place], [phone]   X3   NODE DISSECTION  08/16/2019   Procedure: Node Dissection;  Surgeon: Lajuana Matte, MD;  Location: [facility];  Service: Thoracic;;   TONSILLECTOMY AND ADENOIDECTOMY     VIDEO BRONCHOSCOPY N/A 08/16/2019   Procedure: VIDEO BRONCHOSCOPY;  Surgeon: Lajuana Matte, MD;  [facility];  Service: Thoracic;  Laterality: N/A;       Home Medications    Prior to Admission medications   Medication Sig Start Date End Date Taking? Authorizing Provider  trimethoprim-polymyxin b (POLYTRIM) ophthalmic solution Place 1 drop into the right eye every 4 (four) hours. 03/01/21  Yes Carvel Getting, NP  albuterol (VENTOLIN HFA) 108 (90 Base) MCG/ACT inhaler Inhale 2 puffs into the lungs every 6 (six) hours as needed for wheezing or shortness of breath. 04/16/18   Chesley Mires, MD  aspirin EC 81 MG tablet Take 1 tablet (81 mg total) by mouth daily. Swallow whole.  06/21/20   Lelon Perla, MD  azithromycin (ZITHROMAX) 250 MG tablet 2 tabs day 1, then one tab daily 01/24/21   Lucretia Kern, DO  benzonatate (TESSALON PERLES) 100 MG capsule 1-2 capsules up to twice daily 01/24/21   Lucretia Kern, DO  JARDIANCE 25 MG TABS tablet Take 1 tablet (25 mg total) by mouth daily before breakfast. 01/28/21   Burns, Claudina Lick, MD  labetalol (NORMODYNE) 200 MG tablet TAKE 1 TABLET BY MOUTH TWICE DAILY. 06/04/20   Lelon Perla, MD  metFORMIN (GLUCOPHAGE) 500 MG tablet TAKE TWO TABLETS BY MOUTH TWICE DAILY WITH FOOD 01/28/21   [name], MD  rosuvastatin (CRESTOR) 40 MG tablet Take 1 tablet (40 mg total) by mouth daily. 06/21/20 09/19/20  Lelon Perla, MD  spironolactone  (ALDACTONE) 25 MG tablet TAKE 1 TABLET ONCE DAILY. 02/04/21   [name], MD  telmisartan (MICARDIS) 80 MG tablet TAKE 1 TABLET ONCE DAILY. 06/13/20   Lelon Perla, MD    Family History Family History  Problem Relation Age of Onset   Cancer Mother        liver &multiple myeloma   Heart disease Mother        CABG, Angina   Hyperlipidemia Mother    Hypertension Father    Heart disease Father        MI in 33"s   Dementia Father    Cancer Maternal Grandfather        bone   Other Sister        perforated bowel   Other Sister        cns aneurysm   Diabetes Neg Hx    Stroke Neg Hx    Colon cancer Neg Hx     Social History Social History   Tobacco Use   Smoking status: Never   Smokeless tobacco: Never  Vaping Use   Vaping Use: Never used  Substance Use Topics   Alcohol use: Yes    Alcohol/week: 2.0 standard drinks    Types: 1 Glasses of wine, 1 Shots of liquor per week    Comment: socially < 2/ week   Drug use: No     Allergies   Diltiazem hcl and Penicillins   Review of Systems Review of Systems  HENT:  Positive for congestion. Negative for sinus pressure and sinus pain.   Eyes:  Positive for discharge and redness. Negative for pain, itching and visual disturbance.    Physical Exam Triage Vital Signs ED Triage Vitals [03/01/21 1743]  Enc Vitals Group     BP 123/78     Pulse Rate 72     Resp 18     Temp 98.3 F (36.8 C)     Temp Source Oral     SpO2 96 %     Weight      Height      Head Circumference      Peak Flow      Pain Score 2     Pain Loc      Pain Edu?      Excl. in [place]?    No data found.  Updated Vital Signs BP 123/78 (BP Location: Right Arm)   Pulse 72   Temp 98.3 F (36.8 C) (Oral)   Resp 18   SpO2 96%   Visual Acuity Right Eye Distance:   Left Eye Distance:   Bilateral Distance:    Right Eye Near:   Left Eye Near:  Bilateral Near:     Physical Exam Constitutional:      Appearance: Normal appearance. He is not  ill-appearing.  Eyes:     Conjunctiva/sclera:     Right eye: Right conjunctiva is injected. Exudate present.     Left eye: Left conjunctiva is not injected. No exudate. Pulmonary:     Effort: Pulmonary effort is normal.  Neurological:     Mental Status: He is alert.     UC Treatments / Results  Labs (all labs ordered are listed, but only abnormal results are displayed) Labs Reviewed - No data to display  EKG   Radiology No results found.  Procedures Procedures (including critical care time)  Medications Ordered in UC Medications - No data to display  Initial Impression / Assessment and Plan / UC Course  I have reviewed the triage vital signs and the nursing notes.  Pertinent labs & imaging results that were available during my care of the patient were reviewed by me and considered in my medical decision making (see chart for details).    Symptoms and exam consistent with bacterial conjunctivitis.   Final Clinical Impressions(s) / UC Diagnoses   Final diagnoses:  Bacterial conjunctivitis of right eye     Discharge Instructions      Use a warm compress to your eye several times a day using a clean washcloth each time.    ED Prescriptions     Medication Sig Dispense Auth. Provider   trimethoprim-polymyxin b (POLYTRIM) ophthalmic solution Place 1 drop into the right eye every 4 (four) hours. 10 mL Carvel Getting, NP      PDMP not reviewed this encounter.   Carvel Getting, NP 03/01/21 1801

## 2021-03-01 NOTE — Discharge Instructions (Signed)
Use a warm compress to your eye several times a day using a clean washcloth each time.

## 2021-03-02 ENCOUNTER — Telehealth (HOSPITAL_COMMUNITY): Payer: Self-pay | Admitting: Emergency Medicine

## 2021-03-02 MED ORDER — ERYTHROMYCIN 5 MG/GM OP OINT: TOPICAL_OINTMENT | OPHTHALMIC | 0 refills | Status: DC

## 2021-03-02 NOTE — Telephone Encounter (Signed)
Patient came into urgent care unhappy after being diagnosed with bacterial conjunctivitis.  Patient has bilateral involvement now in association with rhinorrhea, nasal congestion and nonproductive cough.  Explained to patient that his symptoms were suggestive of viral conjunctivitis and the antibiotic would not be helpful.  Patient insisted that another antibiotic be attempted.  He also requested prescription for azithromycin and request was declined as patient has a virus.  I did offer to prescribe erythromycin as I do feel that emollient nature of erythromycin can help with matting and crusting.  All patient questions were answered.

## 2021-03-04 ENCOUNTER — Encounter: Payer: Self-pay | Admitting: Internal Medicine

## 2021-03-05 ENCOUNTER — Telehealth (INDEPENDENT_AMBULATORY_CARE_PROVIDER_SITE_OTHER): Payer: Medicare Other | Admitting: Internal Medicine

## 2021-03-05 ENCOUNTER — Encounter: Payer: Self-pay | Admitting: Internal Medicine

## 2021-03-05 DIAGNOSIS — J019 Acute sinusitis, unspecified: Secondary | ICD-10-CM | POA: Diagnosis not present

## 2021-03-05 DIAGNOSIS — I2584 Coronary atherosclerosis due to calcified coronary lesion: Secondary | ICD-10-CM

## 2021-03-05 DIAGNOSIS — I2510 Atherosclerotic heart disease of native coronary artery without angina pectoris: Secondary | ICD-10-CM | POA: Diagnosis not present

## 2021-03-05 MED ORDER — AZITHROMYCIN 250 MG PO TABS: ORAL_TABLET | ORAL | 0 refills | Status: DC

## 2021-03-05 MED ORDER — HYDROCODONE BIT-HOMATROP MBR 5-1.5 MG/5ML PO SOLN: 5.0000 mL | Freq: Four times a day (QID) | ORAL | 0 refills | Status: DC | PRN

## 2021-03-05 NOTE — Assessment & Plan Note (Addendum)
Acute Turn for bacterial given his history Start Z-Pak Hydromet cough syrup 5 mL every 6 hours as needed otc cold medications Rest, fluid Call if no improvement

## 2021-03-05 NOTE — Progress Notes (Signed)
Virtual Visit via Video Note  I connected with Jonathon Armstrong on 03/05/21 at 10:20 AM EST by a video enabled telemedicine application and verified that I am speaking with the correct person using two identifiers.   I discussed the limitations of evaluation and management by telemedicine and the availability of in person appointments. The patient expressed understanding and agreed to proceed.  Present for the visit:  Myself, Dr Billey Gosling, Jonathon Armstrong.  The patient is currently at home and I am in the office.    No referring provider.    History of Present Illness: He is here for an acute visit for cold symptoms.  His symptoms started last Wednesday as a mild sinus infection.  Last weekend his right eye became swollen and red.  His symptoms got worse - both eyes had it.  They gave him an ointment for his eyes.  They did not give him an antibiotic.    He is experiencing hoarseness, mild sore throat soreness/irritation from drainage, postnasal drip, cough, significant congestion and some sinus pressure.  His symptoms continue to get worse.  His eyes are a little better - less swollen and redness is better.   He has tried taking otc cough syrup, anti-histamine  He has an extensive history of sinus infections.  Review of Systems  Constitutional:  Negative for chills and fever.  HENT:  Positive for congestion and sore throat (soreness - related to drainage). Negative for ear pain and sinus pain.        Hoarseness  Respiratory:  Positive for cough. Negative for sputum production, shortness of breath and wheezing.   Neurological:  Negative for dizziness and headaches.    Social History   Socioeconomic History   Marital status: Married    Spouse name: Not on file   Number of children: 2   Years of education: 18   Highest education level: Not on file  Occupational History   Occupation: Retired    Fish farm manager: RETIRED  Tobacco Use   Smoking status: Never   Smokeless tobacco:  Never  Vaping Use   Vaping Use: Never used  Substance and Sexual Activity   Alcohol use: Yes    Alcohol/week: 2.0 standard drinks    Types: 1 Glasses of wine, 1 Shots of liquor per week    Comment: socially < 2/ week   Drug use: No   Sexual activity: Yes  Other Topics Concern   Not on file  Social History Narrative   Fun: Water, swimming, [employer]; Volunteer work    Denies abuse and feels safe at home.   Regular exercise      Social Determinants of Health   Financial Resource Strain: Low Risk    Difficulty of Paying Living Expenses: Not hard at all  Food Insecurity: No Food Insecurity   Worried About Charity fundraiser in the Last Year: Never true   Arboriculturist in the Last Year: Never true  Transportation Needs: No Transportation Needs   Lack of Transportation (Medical): No   Lack of Transportation (Non-Medical): No  Physical Activity: Sufficiently Active   Days of Exercise per Week: 5 days   Minutes of Exercise per Session: 30 min  Stress: No Stress Concern Present   Feeling of Stress : Not at all  Social Connections: Socially Integrated   Frequency of Communication with Friends and Family: More than three times a week   Frequency of Social Gatherings with Friends and Family: More than three  times a week   Attends Religious Services: More than 4 times per year   Active Member of Clubs or Organizations: Yes   Attends Archivist Meetings: More than 4 times per year   Marital Status: Married     Observations/Objective: Appears well in NAD Eyes minimally swollen Breathing normally  Assessment and Plan:  See Problem List for Assessment and Plan of chronic medical problems.   Follow Up Instructions:    I discussed the assessment and treatment plan with the patient. The patient was provided an opportunity to ask questions and all were answered. The patient agreed with the plan and demonstrated an understanding of the instructions.   The patient was  advised to call back or seek an in-person evaluation if the symptoms worsen or if the condition fails to improve as anticipated.    [name], MD

## 2021-03-15 ENCOUNTER — Encounter: Payer: Self-pay | Admitting: Internal Medicine

## 2021-03-15 ENCOUNTER — Other Ambulatory Visit: Payer: Self-pay

## 2021-03-15 ENCOUNTER — Ambulatory Visit (INDEPENDENT_AMBULATORY_CARE_PROVIDER_SITE_OTHER): Payer: Medicare Other | Admitting: Internal Medicine

## 2021-03-15 VITALS — BP 120/66 | HR 71 | Temp 98.5°F | Ht 70.0 in | Wt 215.0 lb

## 2021-03-15 DIAGNOSIS — I10 Essential (primary) hypertension: Secondary | ICD-10-CM | POA: Diagnosis not present

## 2021-03-15 DIAGNOSIS — R051 Acute cough: Secondary | ICD-10-CM | POA: Diagnosis not present

## 2021-03-15 DIAGNOSIS — I2510 Atherosclerotic heart disease of native coronary artery without angina pectoris: Secondary | ICD-10-CM | POA: Diagnosis not present

## 2021-03-15 DIAGNOSIS — R0600 Dyspnea, unspecified: Secondary | ICD-10-CM | POA: Diagnosis not present

## 2021-03-15 DIAGNOSIS — I2584 Coronary atherosclerosis due to calcified coronary lesion: Secondary | ICD-10-CM

## 2021-03-15 DIAGNOSIS — E1165 Type 2 diabetes mellitus with hyperglycemia: Secondary | ICD-10-CM

## 2021-03-15 MED ORDER — HYDROCODONE BIT-HOMATROP MBR 5-1.5 MG/5ML PO SOLN: 5.0000 mL | Freq: Four times a day (QID) | ORAL | 0 refills | Status: AC | PRN

## 2021-03-15 MED ORDER — PREDNISONE 10 MG PO TABS: ORAL_TABLET | ORAL | 0 refills | Status: DC

## 2021-03-15 MED ORDER — LEVOFLOXACIN 500 MG PO TABS: 500.0000 mg | ORAL_TABLET | Freq: Every day | ORAL | 0 refills | Status: AC

## 2021-03-15 NOTE — Patient Instructions (Signed)
Please take all new medication as prescribed - the antibiotic, cough medicine and prednisone  Please continue all other medications as before, and refills have been done if requested.  Please have the pharmacy call with any other refills you may need.  Please keep your appointments with your specialists as you may have planned     

## 2021-03-15 NOTE — Progress Notes (Signed)
Patient ID: Jonathon Armstrong, male   DOB: 1950/08/03, 70 y.o.   MRN: [id number]        Chief Complaint: follow up sinus symptoms, dypsnea, htn, dm, anxiety        HPI:  Jonathon Armstrong is a 70 y.o. male here with c/o  Here with 2-3 wks acute onset fever, facial pain, pressure, headache, general weakness and malaise, and greenish d/c, with mild ST and cough, but pt denies chest pain, wheezing, increased sob or doe, orthopnea, PND, increased LE swelling, palpitations, dizziness or syncope, except for worsening sob/doe in the past 2-3 days.  Has some increased anxiety related, and mentions long discourse about his hx of lung cancer and recent conjunctivitis now resolved.   Pt denies polydipsia, polyuria, or new focal neuro s/s.        Wt Readings from Last 3 Encounters:  03/15/21 215 lb (97.5 kg)  10/05/20 220 lb (99.8 kg)  09/27/20 221 lb (100.2 kg)   BP Readings from Last 3 Encounters:  03/15/21 120/66  03/01/21 123/78  10/05/20 126/75         Past Medical History:  Diagnosis Date   Allergy    seasonal   Asthma 06/29/2017   Asystole ([place])    "vagal response"post nasal polypectomy   Complication of anesthesia June 29, 2004   Coded x2 after nasal polyp removal (Believes vagal nerve response)   Diabetes mellitus without complication ([place])    pre but take metformin   Diverticulosis of colon    FH: colonic polyps    hx of   Hyperlipidemia    Hypertension    Mitral regurgitation    mild & aortic Valve calcification on 2d echo;sbe prophylaxis   Parotitis, acute June 30, 2011   Seasonal rhinitis    Sleep apnea    CPAP   Past Surgical History:  Procedure Laterality Date   APPENDECTOMY     asytole post nasal polypectomy  2006/06/30   colonoscopy with polypectomy  06/29/08   Dr Olevia Perches   heriorraphy     x2   [place] Left 08/16/2019   Procedure: Intercostal Nerve Block;  Surgeon: Lajuana Matte, MD;  Location: [facility];  Service: Thoracic;  Laterality: Left;   MIDDLE EAR  SURGERY     TM replacement   NASAL SINUS [place], [phone]   X3   NODE DISSECTION  08/16/2019   Procedure: Node Dissection;  Surgeon: Lajuana Matte, MD;  Location: [facility];  Service: Thoracic;;   TONSILLECTOMY AND ADENOIDECTOMY     VIDEO BRONCHOSCOPY N/A 08/16/2019   Procedure: VIDEO BRONCHOSCOPY;  Surgeon: Lajuana Matte, MD;  Location: [facility];  Service: Thoracic;  Laterality: N/A;    reports that he has never smoked. He has never used smokeless tobacco. He reports current alcohol use of about 2.0 standard drinks per week. He reports that he does not use drugs. family history includes Cancer in his maternal grandfather and mother; Dementia in his father; Heart disease in his father and mother; Hyperlipidemia in his mother; Hypertension in his father; Other in his sister and sister. Allergies  Allergen Reactions   Diltiazem Hcl Other (See Comments) and Cough    ? symptoms   Penicillins     Rash with Amoxicillin with Mono in college   Current Outpatient Medications on File Prior to Visit  Medication Sig Dispense Refill   albuterol (VENTOLIN HFA) 108 (90 Base) MCG/ACT inhaler Inhale 2 puffs into the  lungs every 6 (six) hours as needed for wheezing or shortness of breath. 1 Inhaler 5   aspirin EC 81 MG tablet Take 1 tablet (81 mg total) by mouth daily. Swallow whole. 90 tablet 3   JARDIANCE 25 MG TABS tablet Take 1 tablet (25 mg total) by mouth daily before breakfast. 90 tablet 1   labetalol (NORMODYNE) 200 MG tablet TAKE 1 TABLET BY MOUTH TWICE DAILY. 180 tablet 3   metFORMIN (GLUCOPHAGE) 500 MG tablet TAKE TWO TABLETS BY MOUTH TWICE DAILY WITH FOOD 120 tablet 1   spironolactone (ALDACTONE) 25 MG tablet TAKE 1 TABLET ONCE DAILY. 90 tablet 1   telmisartan (MICARDIS) 80 MG tablet TAKE 1 TABLET ONCE DAILY. 90 tablet 3   trimethoprim-polymyxin b (POLYTRIM) ophthalmic solution Place 1 drop into the right eye every 4 (four) hours. 10 mL 0   azithromycin (ZITHROMAX) 250 MG tablet  Take two tabs the first day and then one tab daily for four days (Patient not taking: Reported on 03/15/2021) 6 tablet 0   benzonatate (TESSALON PERLES) 100 MG capsule 1-2 capsules up to twice daily (Patient not taking: Reported on 03/15/2021) 30 capsule 0   erythromycin ophthalmic ointment Place a 1/2 inch ribbon of ointment into the lower eyelid four times daily for one week. (Patient not taking: Reported on 03/15/2021) 3.5 g 0   rosuvastatin (CRESTOR) 40 MG tablet Take 1 tablet (40 mg total) by mouth daily. 90 tablet 3   No current facility-administered medications on file prior to visit.        ROS:  All others reviewed and negative.  Objective        PE:  BP 120/66    Pulse 71    Temp 98.5 F (36.9 C)    Ht 5\' 10"  (1.778 m)    Wt 215 lb (97.5 kg)    SpO2 100%    BMI 30.85 kg/m                 Constitutional: Pt appears in NAD, mild ill               HENT: Head: NCAT.                Right Ear: External ear normal.                 Left Ear: External ear normal.                Eyes: . Pupils are equal, round, and reactive to light. Conjunctivae and EOM are normal               Nose: without d/c or deformity               Neck: Neck supple. Gross normal ROM               Cardiovascular: Normal rate and regular rhythm.                 Pulmonary/Chest: Effort normal and breath sounds decreased without rales but with few wheezing.                 Neurological: Pt is alert. At baseline orientation, motor grossly intact               Skin: Skin is warm. No rashes, no other new lesions, LE edema - none               Psychiatric: Pt behavior is normal without  agitation , mod nervous  Micro: none  Cardiac tracings I have personally interpreted today:  none  Pertinent Radiological findings (summarize): none   Lab Results  Component Value Date   WBC 5.7 11/13/2020   HGB 11.5 (L) 11/13/2020   HCT 32.5 (L) 11/13/2020   PLT 136.0 (L) 11/13/2020   GLUCOSE 211 (H) 09/27/2020   CHOL 99 (L)  09/20/2020   TRIG 209 (H) 09/20/2020   HDL 34 (L) 09/20/2020   LDLDIRECT 76.0 01/03/2020   LDLCALC 32 09/20/2020   ALT 23 09/20/2020   AST 20 09/20/2020   NA 138 09/27/2020   K 4.4 09/27/2020   CL 104 09/27/2020   CREATININE 1.18 09/27/2020   BUN 21 09/27/2020   CO2 26 09/27/2020   TSH 1.41 11/12/2018   PSA 2.61 11/12/2018   INR 1.0 08/15/2019   HGBA1C 7.7 (H) 11/13/2020   MICROALBUR 1.3 04/30/2016   Assessment/Plan:  Jonathon Armstrong is a 70 y.o. White or Caucasian [1] male with  has a past medical history of Allergy, Asthma (2019), Asystole ([place]), Complication of anesthesia (2006), Diabetes mellitus without complication ([place]), Diverticulosis of colon, FH: colonic polyps, Hyperlipidemia, Hypertension, Mitral regurgitation, Parotitis, acute (2013), Seasonal rhinitis, and Sleep apnea.  Essential hypertension BP Readings from Last 3 Encounters:  03/15/21 120/66  03/01/21 123/78  10/05/20 126/75   Stable, pt to continue medical treatment normodyne, micardis   Diabetes ([place]) Lab Results  Component Value Date   HGBA1C 7.7 (H) 11/13/2020   Mild uncontrolled, pt to continue current medical treatment jardiance, metformin, and work on improved diet adherence   Cough C/w acute URI and bronchitis like symptoms, cant r/o pna, delcines cxr, for levaquin course, cough med prn  Dyspnea With very mild bronchospasm it seems, for prednisone asd, declines inhaler prn,  to f/u any worsening symptoms or concerns  Followup: Return if symptoms worsen or fail to improve.  Cathlean Cower, MD 03/18/2021 4:44 PM [facility]

## 2021-03-18 ENCOUNTER — Encounter: Payer: Self-pay | Admitting: Internal Medicine

## 2021-03-18 DIAGNOSIS — R0600 Dyspnea, unspecified: Secondary | ICD-10-CM | POA: Insufficient documentation

## 2021-03-18 NOTE — Assessment & Plan Note (Signed)
C/w acute URI and bronchitis like symptoms, cant r/o pna, delcines cxr, for levaquin course, cough med prn

## 2021-03-18 NOTE — Assessment & Plan Note (Signed)
Lab Results  Component Value Date   HGBA1C 7.7 (H) 11/13/2020   Mild uncontrolled, pt to continue current medical treatment jardiance, metformin, and work on improved diet adherence

## 2021-03-18 NOTE — Assessment & Plan Note (Signed)
BP Readings from Last 3 Encounters:  03/15/21 120/66  03/01/21 123/78  10/05/20 126/75   Stable, pt to continue medical treatment normodyne, micardis

## 2021-03-18 NOTE — Assessment & Plan Note (Signed)
With very mild bronchospasm it seems, for prednisone asd, declines inhaler prn,  to f/u any worsening symptoms or concerns

## 2021-03-30 ENCOUNTER — Other Ambulatory Visit: Payer: Self-pay | Admitting: Internal Medicine

## 2021-03-30 MED ORDER — EMPAGLIFLOZIN 25 MG PO TABS: ORAL_TABLET | ORAL | 1 refills | Status: DC

## 2021-04-01 ENCOUNTER — Encounter: Payer: Self-pay | Admitting: Internal Medicine

## 2021-04-01 NOTE — Patient Instructions (Addendum)
°  Blood work was ordered.     Medications changes include :   none  Your prescription(s) have been submitted to your pharmacy. Please take as directed and contact our office if you believe you are having problem(s) with the medication(s).    Please followup in 4 months

## 2021-04-01 NOTE — Progress Notes (Signed)
Subjective:    Patient ID: Jonathon Armstrong, male    DOB: Mar 20, 1951, 71 y.o.   MRN: [id number]  This visit occurred during the SARS-CoV-2 public health emergency.  Safety protocols were in place, including screening questions prior to the visit, additional usage of staff PPE, and extensive cleaning of exam room while observing appropriate contact time as indicated for disinfecting solutions.     HPI The patient is here for follow up of their chronic medical problems, including DM, htn, hld  He is not walking for exercise over the past two months due ot family issues.  He is not sure how good he has done over the past 2 months with sugar intake   Medications and allergies reviewed with patient and updated if appropriate.  Patient Active Problem List   Diagnosis Date Noted   Dyspnea 03/18/2021   Primary adenocarcinoma of lower lobe of left lung ([name]) 09/16/2019   Diarrhea 09/16/2019   S/P lobectomy of lung 08/16/2019   Acute sinus infection 07/05/2019   Pain and swelling of right lower leg 02/24/2019   Chronic Eustachian tube dysfunction, right 01/18/2018   Bilateral hearing loss 01/18/2018   Cough 06/15/2015   Diabetes ([name]) 02/25/2013   Hyperlipidemia 12/05/2010   NONSPECIFIC ABNORMAL ELECTROCARDIOGRAM 08/13/2009   Hyperuricemia 02/07/2008   DIVERTICULOSIS, COLON 10/08/2007   History of colonic polyps 10/08/2007   Essential hypertension 03/26/2007   ASYSTOLE 02/22/2007   Obstructive sleep apnea 08/28/2006    Current Outpatient Medications on File Prior to Visit  Medication Sig Dispense Refill   albuterol (VENTOLIN HFA) 108 (90 Base) MCG/ACT inhaler Inhale 2 puffs into the lungs every 6 (six) hours as needed for wheezing or shortness of breath. 1 Inhaler 5   aspirin EC 81 MG tablet Take 1 tablet (81 mg total) by mouth daily. Swallow whole. 90 tablet 3   empagliflozin (JARDIANCE) 25 MG TABS tablet Take 1 tablet (25 mg total) by mouth daily before breakfast. 90 tablet  1   labetalol (NORMODYNE) 200 MG tablet TAKE 1 TABLET BY MOUTH TWICE DAILY. 180 tablet 3   metFORMIN (GLUCOPHAGE) 500 MG tablet TAKE TWO TABLETS BY MOUTH TWICE DAILY WITH FOOD 120 tablet 1   spironolactone (ALDACTONE) 25 MG tablet TAKE 1 TABLET ONCE DAILY. 90 tablet 1   telmisartan (MICARDIS) 80 MG tablet TAKE 1 TABLET ONCE DAILY. 90 tablet 3   rosuvastatin (CRESTOR) 40 MG tablet Take 1 tablet (40 mg total) by mouth daily. 90 tablet 3   No current facility-administered medications on file prior to visit.    Past Medical History:  Diagnosis Date   Allergy    seasonal   Asthma June 10, 2017   Asystole ([place])    "vagal response"post nasal polypectomy   Complication of anesthesia 06-10-2004   Coded x2 after nasal polyp removal (Believes vagal nerve response)   Diabetes mellitus without complication ([place])    pre but take metformin   Diverticulosis of colon    FH: colonic polyps    hx of   Hyperlipidemia    Hypertension    Mitral regurgitation    mild & aortic Valve calcification on 2d echo;sbe prophylaxis   Parotitis, acute 11-Jun-2011   Seasonal rhinitis    Sleep apnea    CPAP    Past Surgical History:  Procedure Laterality Date   APPENDECTOMY     asytole post nasal polypectomy  Jun 10, 2006   colonoscopy with polypectomy  10-Jun-2008   Dr Olevia Perches   heriorraphy  x2   HERNIA REPAIR     INTERCOSTAL NERVE BLOCK Left 08/16/2019   Procedure: Intercostal Nerve Block;  Surgeon: Lajuana Matte, MD;  Location: [facility];  Service: Thoracic;  Laterality: Left;   MIDDLE EAR SURGERY     TM replacement   NASAL SINUS SURGERY  1994, [id number]   X3   NODE DISSECTION  08/16/2019   Procedure: Node Dissection;  Surgeon: Lajuana Matte, MD;  Location: [facility];  Service: Thoracic;;   TONSILLECTOMY AND ADENOIDECTOMY     VIDEO BRONCHOSCOPY N/A 08/16/2019   Procedure: VIDEO BRONCHOSCOPY;  Surgeon: Lajuana Matte, MD;  Location[facility];  Service: Thoracic;  Laterality: N/A;    Social History   Socioeconomic History    Marital status: Married    Spouse name: Not on file   Number of children: 2   Years of education: 18   Highest education level: Not on file  Occupational History   Occupation: Retired    Fish farm manager: RETIRED  Tobacco Use   Smoking status: Never   Smokeless tobacco: Never  Vaping Use   Vaping Use: Never used  Substance and Sexual Activity   Alcohol use: Yes    Alcohol/week: 2.0 standard drinks    Types: 1 Glasses of wine, 1 Shots of liquor per week    Comment: socially < 2/ week   Drug use: No   Sexual activity: Yes  Other Topics Concern   Not on file  Social History Narrative   Fun: Water, swimming, [employer]; Volunteer work    Denies abuse and feels safe at home.   Regular exercise      Social Determinants of Health   Financial Resource Strain: Low Risk    Difficulty of Paying Living Expenses: Not hard at all  Food Insecurity: No Food Insecurity   Worried About Charity fundraiser in the Last Year: Never true   Arboriculturist in the Last Year: Never true  Transportation Needs: No Transportation Needs   Lack of Transportation (Medical): No   Lack of Transportation (Non-Medical): No  Physical Activity: Sufficiently Active   Days of Exercise per Week: 5 days   Minutes of Exercise per Session: 30 min  Stress: No Stress Concern Present   Feeling of Stress : Not at all  Social Connections: Socially Integrated   Frequency of Communication with Friends and Family: More than three times a week   Frequency of Social Gatherings with Friends and Family: More than three times a week   Attends Religious Services: More than 4 times per year   Active Member of [employer] or Organizations: Yes   Attends Music therapist: More than 4 times per year   Marital Status: Married    Family History  Problem Relation Age of Onset   Cancer Mother        liver &multiple myeloma   Heart disease Mother        CABG, Angina   Hyperlipidemia Mother    Hypertension Father     Heart disease Father        MI in 52"s   Dementia Father    COPD Sister    Other Sister        perforated bowel   Other Sister        cns aneurysm   Cancer Maternal Grandfather        bone   Diabetes Neg Hx    Stroke Neg Hx    Colon cancer  Neg Hx     Review of Systems  Constitutional:  Negative for chills and fever.  Respiratory:  Negative for cough, shortness of breath and wheezing.   Cardiovascular:  Negative for chest pain, palpitations and leg swelling.  Neurological:  Negative for light-headedness and headaches.      Objective:   Vitals:   04/02/21 0757  BP: 110/68  Pulse: 80  Temp: 98.3 F (36.8 C)  SpO2: 98%   BP Readings from Last 3 Encounters:  04/02/21 110/68  03/15/21 120/66  03/01/21 123/78   Wt Readings from Last 3 Encounters:  04/02/21 213 lb (96.6 kg)  03/15/21 215 lb (97.5 kg)  10/05/20 220 lb (99.8 kg)   Body mass index is 30.56 kg/m.   Physical Exam    Constitutional: Appears well-developed and well-nourished. No distress.  HENT:  Head: Normocephalic and atraumatic.  Neck: Neck supple. No tracheal deviation present. No thyromegaly present.  No cervical lymphadenopathy Cardiovascular: Normal rate, regular rhythm and normal heart sounds.   No murmur heard. No carotid bruit .  No edema Pulmonary/Chest: Effort normal and breath sounds normal. No respiratory distress. No has no wheezes. No rales.  Skin: Skin is warm and dry. Not diaphoretic.  Psychiatric: Normal mood and affect. Behavior is normal.      Assessment & Plan:    See Problem List for Assessment and Plan of chronic medical problems.

## 2021-04-02 ENCOUNTER — Encounter: Payer: Self-pay | Admitting: Internal Medicine

## 2021-04-02 ENCOUNTER — Ambulatory Visit (INDEPENDENT_AMBULATORY_CARE_PROVIDER_SITE_OTHER): Payer: Medicare Other | Admitting: Internal Medicine

## 2021-04-02 ENCOUNTER — Other Ambulatory Visit: Payer: Self-pay

## 2021-04-02 VITALS — BP 110/68 | HR 80 | Temp 98.3°F | Ht 70.0 in | Wt 213.0 lb

## 2021-04-02 DIAGNOSIS — E782 Mixed hyperlipidemia: Secondary | ICD-10-CM

## 2021-04-02 DIAGNOSIS — E1165 Type 2 diabetes mellitus with hyperglycemia: Secondary | ICD-10-CM | POA: Diagnosis not present

## 2021-04-02 DIAGNOSIS — I10 Essential (primary) hypertension: Secondary | ICD-10-CM

## 2021-04-02 DIAGNOSIS — D696 Thrombocytopenia, unspecified: Secondary | ICD-10-CM

## 2021-04-02 DIAGNOSIS — D649 Anemia, unspecified: Secondary | ICD-10-CM

## 2021-04-02 LAB — COMPREHENSIVE METABOLIC PANEL
ALT: 20 U/L (ref 0–53)
AST: 16 U/L (ref 0–37)
Albumin: 4.6 g/dL (ref 3.5–5.2)
Alkaline Phosphatase: 60 U/L (ref 39–117)
BUN: 26 mg/dL — ABNORMAL HIGH (ref 6–23)
CO2: 23 mEq/L (ref 19–32)
Calcium: 10.1 mg/dL (ref 8.4–10.5)
Chloride: 104 mEq/L (ref 96–112)
Creatinine, Ser: 1.35 mg/dL (ref 0.40–1.50)
GFR: 52.76 mL/min — ABNORMAL LOW (ref >60.00)
Glucose, Bld: 137 mg/dL — ABNORMAL HIGH (ref 70–99)
Potassium: 4.6 mEq/L (ref 3.5–5.1)
Sodium: 137 mEq/L (ref 135–145)
Total Bilirubin: 0.5 mg/dL (ref 0.2–1.2)
Total Protein: 7 g/dL (ref 6.0–8.3)

## 2021-04-02 LAB — CBC WITH DIFFERENTIAL/PLATELET
Basophils Absolute: 0 10*3/uL (ref 0.0–0.1)
Basophils Relative: 0.4 % (ref 0.0–3.0)
Eosinophils Absolute: 0.2 10*3/uL (ref 0.0–0.7)
Eosinophils Relative: 2.6 % (ref 0.0–5.0)
HCT: 34.5 % — ABNORMAL LOW (ref 39.0–52.0)
Hemoglobin: 11.5 g/dL — ABNORMAL LOW (ref 13.0–17.0)
Lymphocytes Relative: 26.5 % (ref 12.0–46.0)
Lymphs Abs: 1.7 10*3/uL (ref 0.7–4.0)
MCHC: 33.4 g/dL (ref 30.0–36.0)
MCV: 89.3 fl (ref 78.0–100.0)
Monocytes Absolute: 0.5 10*3/uL (ref 0.1–1.0)
Monocytes Relative: 8 % (ref 3.0–12.0)
Neutro Abs: 4.1 10*3/uL (ref 1.4–7.7)
Neutrophils Relative %: 62.5 % (ref 43.0–77.0)
Platelets: 124 10*3/uL — ABNORMAL LOW (ref 150.0–400.0)
RBC: 3.86 Mil/uL — ABNORMAL LOW (ref 4.22–5.81)
RDW: 14.3 % (ref 11.5–15.5)
WBC: 6.6 10*3/uL (ref 4.0–10.5)

## 2021-04-02 LAB — LIPID PANEL
Cholesterol: 102 mg/dL (ref 0–200)
HDL: 34.4 mg/dL — ABNORMAL LOW (ref >39.00)
NonHDL: 67.81
Total CHOL/HDL Ratio: 3
Triglycerides: 282 mg/dL — ABNORMAL HIGH (ref 0.0–149.0)
VLDL: 56.4 mg/dL — ABNORMAL HIGH (ref 0.0–40.0)

## 2021-04-02 LAB — HEMOGLOBIN A1C: Hgb A1c MFr Bld: 7.7 % — ABNORMAL HIGH (ref 4.6–6.5)

## 2021-04-02 LAB — LDL CHOLESTEROL, DIRECT: Direct LDL: 31 mg/dL

## 2021-04-02 MED ORDER — METFORMIN HCL 500 MG PO TABS: ORAL_TABLET | ORAL | 1 refills | Status: DC

## 2021-04-02 NOTE — Assessment & Plan Note (Signed)
Chronic Lab Results  Component Value Date   HGBA1C 7.7 (H) 11/13/2020   Not ideally controlled a few months ago Check A1c today Continue Jardiance 25 mg daily, metformin 1000 mg twice daily Consider adding Rybelsus 3 mg daily

## 2021-04-02 NOTE — Assessment & Plan Note (Signed)
Chronic Regular exercise and healthy diet encouraged Check lipid panel  Continue rosuvastatin 40 mg daily

## 2021-04-02 NOTE — Assessment & Plan Note (Signed)
Chronic Blood pressure well controlled CMP Continue labetalol 200 mg twice daily, spironolactone 25 mg daily, telmisartan 80 mg daily

## 2021-04-04 ENCOUNTER — Encounter: Payer: Self-pay | Admitting: Internal Medicine

## 2021-04-04 DIAGNOSIS — D649 Anemia, unspecified: Secondary | ICD-10-CM | POA: Insufficient documentation

## 2021-04-04 MED ORDER — RYBELSUS 3 MG PO TABS: 3.0000 mg | ORAL_TABLET | Freq: Every day | ORAL | 2 refills | Status: DC

## 2021-04-12 ENCOUNTER — Telehealth: Payer: Medicare Other | Admitting: Thoracic Surgery (Cardiothoracic Vascular Surgery)

## 2021-04-12 ENCOUNTER — Ambulatory Visit
Admission: RE | Admit: 2021-04-12 | Discharge: 2021-04-12 | Disposition: A | Discharge status: Home / Self Care | Payer: Medicare Other | Source: Ambulatory Visit | Attending: Thoracic Surgery (Cardiothoracic Vascular Surgery) | Admitting: Thoracic Surgery (Cardiothoracic Vascular Surgery)

## 2021-04-12 ENCOUNTER — Other Ambulatory Visit: Payer: Self-pay

## 2021-04-12 ENCOUNTER — Ambulatory Visit (INDEPENDENT_AMBULATORY_CARE_PROVIDER_SITE_OTHER): Payer: Medicare Other | Admitting: Thoracic Surgery (Cardiothoracic Vascular Surgery)

## 2021-04-12 DIAGNOSIS — I3139 Other pericardial effusion (noninflammatory): Secondary | ICD-10-CM | POA: Diagnosis not present

## 2021-04-12 DIAGNOSIS — Z902 Acquired absence of lung [part of]: Secondary | ICD-10-CM

## 2021-04-12 DIAGNOSIS — R911 Solitary pulmonary nodule: Secondary | ICD-10-CM | POA: Diagnosis not present

## 2021-04-12 DIAGNOSIS — C3432 Malignant neoplasm of lower lobe, left bronchus or lung: Secondary | ICD-10-CM | POA: Diagnosis not present

## 2021-04-12 NOTE — Progress Notes (Signed)
°   °  [address]       ,[place] [address]             [phone]       Patient: Home Provider: Office Consent for Telemedicine visit obtained.  Todays visit was completed via a real-time telehealth (see specific modality noted below). The patient/authorized person provided oral consent at the time of the visit to engage in a telemedicine encounter with the present provider at [facility]. The patient/authorized person was informed of the potential benefits, limitations, and risks of telemedicine. The patient/authorized person expressed understanding that the laws that protect confidentiality also apply to telemedicine. The patient/authorized person acknowledged understanding that telemedicine does not provide emergency services and that he or she would need to call 911 or proceed to the nearest hospital for help if such a need arose.   Total time spent in the clinical discussion 10 minutes.  Telehealth Modality: Phone visit (audio only)  I had a telephone visit with [name].  He is status post robotic assisted left lower lobectomy for T1b N0 M0 stage I adenocarcinoma.  Overall he is doing well.  He had a tough holiday dealing with the family illness.  His sister has been hospitalized for severe COPD.  In rehab now.    We discussed the results of his CT chest.  There is no new evidence of pulmonary nodules or recurrent disease.  He does have a 4.1 cm ascending aortic aneurysm.  We discussed the risks and benefits of ongoing growth and potential dissection of this.  He has very low risk factors.  I will see him back with another CT scan in 6 months.  After that she can go annually given his original surgery was in May 2021.

## 2021-05-15 DIAGNOSIS — E119 Type 2 diabetes mellitus without complications: Secondary | ICD-10-CM | POA: Diagnosis not present

## 2021-05-22 ENCOUNTER — Encounter: Payer: Self-pay | Admitting: Internal Medicine

## 2021-05-29 ENCOUNTER — Ambulatory Visit (INDEPENDENT_AMBULATORY_CARE_PROVIDER_SITE_OTHER): Payer: Medicare Other

## 2021-05-29 ENCOUNTER — Other Ambulatory Visit: Payer: Self-pay

## 2021-05-29 VITALS — BP 110/64 | HR 69 | Temp 98.0°F | Resp 16 | Ht 70.0 in | Wt 214.8 lb

## 2021-05-29 DIAGNOSIS — Z0000 Encounter for general adult medical examination without abnormal findings: Secondary | ICD-10-CM

## 2021-05-29 NOTE — Progress Notes (Signed)
Subjective:   Jonathon Armstrong is a 71 y.o. male who presents for Medicare Annual/Subsequent preventive examination.  Review of Systems     Cardiac Risk Factors include: advanced age (>60mn, >>48women);dyslipidemia;family history of premature cardiovascular disease;diabetes mellitus;hypertension;male gender;obesity (BMI >30kg/m2)     Objective:    Today's Vitals   05/29/21 1042  BP: 110/64  Pulse: 69  Resp: 16  Temp: 98 F (36.7 C)  SpO2: 96%  Weight: 214 lb 12.8 oz (97.4 kg)  Height: 5' 10" (1.778 m)  PainSc: 0-No pain   Body mass index is 30.82 kg/m.  Advanced Directives 05/29/2021 05/28/2020 08/18/2019 08/16/2019 08/15/2019 03/02/2018 09/29/2014  Does Patient Have a Medical Advance Directive? _0  Yes Yes  Type of Advance Directive Living will;Healthcare Power of Attorney Living will;Healthcare Power of [place] -  Does patient want to make changes to medical advance directive? No - Patient declined No - Patient declined No - Patient declined - No - Patient declined - -  Copy of [place] Chart? No - copy requested No - copy requested No - copy requested - No - copy requested No - copy requested -    Current Medications (verified) Outpatient Encounter Medications as of 05/29/2021  Medication Sig   albuterol (VENTOLIN HFA) 108 (90 Base) MCG/ACT inhaler Inhale 2 puffs into the lungs every 6 (six) hours as needed for wheezing or shortness of breath.   aspirin EC 81 MG tablet Take 1 tablet (81 mg total) by mouth daily. Swallow whole.   empagliflozin (JARDIANCE) 25 MG TABS tablet Take 1 tablet (25 mg total) by mouth daily before breakfast.   labetalol (NORMODYNE) 200 MG tablet TAKE 1 TABLET BY MOUTH TWICE DAILY.   metFORMIN (GLUCOPHAGE) 500 MG tablet TAKE TWO TABLETS BY MOUTH TWICE DAILY WITH FOOD   rosuvastatin  (CRESTOR) 40 MG tablet Take 1 tablet (40 mg total) by mouth daily.   Semaglutide (RYBELSUS) 3 MG TABS Take 3 mg by mouth daily.   spironolactone (ALDACTONE) 25 MG tablet TAKE 1 TABLET ONCE DAILY.   telmisartan (MICARDIS) 80 MG tablet TAKE 1 TABLET ONCE DAILY.   No facility-administered encounter medications on file as of 05/29/2021.    Allergies (verified) Diltiazem hcl and Penicillins   History: Past Medical History:  Diagnosis Date   Allergy    seasonal   Asthma 22019-02-08  [place]    "vagal response"post nasal polypectomy   Complication of anesthesia 202/10/2004  Coded x2 after nasal polyp removal (Believes vagal nerve response)   Diabetes mellitus without complication ([place]    pre but take metformin   Diverticulosis of colon    FH: colonic polyps    hx of   Hyperlipidemia    Hypertension    Mitral regurgitation    mild & aortic Valve calcification on 2d echo;sbe prophylaxis   Parotitis, acute 202-08-13  Seasonal rhinitis    Sleep apnea    CPAP   Past Surgical History:  Procedure Laterality Date   APPENDECTOMY     asytole post nasal polypectomy  202/08/08  colonoscopy with polypectomy  22010-02-08  Dr BOlevia Perches  heriorraphy     x2   [place] 08/[place]   Procedure: Intercostal Nerve Block;  Surgeon: LLajuana Matte MD;  [place]  Service:  Thoracic;  Laterality: Left;   MIDDLE EAR SURGERY     TM replacement   NASAL SINUS SURGERY  1994, [id number]   X3   NODE DISSECTION  08/16/2019   Procedure: Node Dissection;  Surgeon: Lajuana Matte, MD;  Location: [facility];  Service: Thoracic;;   TONSILLECTOMY AND ADENOIDECTOMY     VIDEO BRONCHOSCOPY N/A 08/16/2019   Procedure: VIDEO BRONCHOSCOPY;  Surgeon: Lajuana Matte, MD;  [facility];  Service: Thoracic;  Laterality: N/A;   Family History  Problem Relation Age of Onset   Cancer Mother        liver &multiple myeloma   Heart disease Mother        CABG, Angina    Hyperlipidemia Mother    Hypertension Father    Heart disease Father        MI in 32"s   Dementia Father    COPD Sister    Other Sister        perforated bowel   Other Sister        cns aneurysm   Cancer Maternal Grandfather        bone   Diabetes Neg Hx    Stroke Neg Hx    Colon cancer Neg Hx    Social History   Socioeconomic History   Marital status: Married    Spouse name: Not on file   Number of children: 2   Years of education: 18   Highest education level: Not on file  Occupational History   Occupation: Retired    Fish farm manager: RETIRED  Tobacco Use   Smoking status: Never   Smokeless tobacco: Never  Vaping Use   Vaping Use: Never used  Substance and Sexual Activity   Alcohol use: Yes    Alcohol/week: 2.0 standard drinks    Types: 1 Glasses of wine, 1 Shots of liquor per week    Comment: socially < 2/ week   Drug use: No   Sexual activity: Yes  Other Topics Concern   Not on file  Social History Narrative   Fun: Water, swimming, [employer]; Volunteer work    Denies abuse and feels safe at home.   Regular exercise      Social Determinants of Health   Financial Resource Strain: Low Risk    Difficulty of Paying Living Expenses: Not hard at all  Food Insecurity: No Food Insecurity   Worried About Charity fundraiser in the Last Year: Never true   Arboriculturist in the Last Year: Never true  Transportation Needs: No Transportation Needs   Lack of Transportation (Medical): No   Lack of Transportation (Non-Medical): No  Physical Activity: Sufficiently Active   Days of Exercise per Week: 5 days   Minutes of Exercise per Session: 30 min  Stress: No Stress Concern Present   Feeling of Stress : Not at all  Social Connections: Socially Integrated   Frequency of Communication with Friends and Family: More than three times a week   Frequency of Social Gatherings with Friends and Family: More than three times a week   Attends Religious Services: More than 4 times  per year   Active Member of [employer] or Organizations: Yes   Attends Music therapist: More than 4 times per year   Marital Status: Married    Tobacco Counseling Counseling given: Not Answered   Clinical Intake:  Pre-visit preparation completed: Yes  Pain : No/denies pain Pain Score: 0-No pain  BMI - recorded: 30.82 Nutritional Status: BMI > 30  Obese Nutritional Risks: None Diabetes: Yes CBG done?: No Did pt. bring in CBG monitor from home?: No  What is the last grade level you completed in school?: Master's Degree & Certificate in Non-Profit Management  Diabetic? yes  Interpreter Needed?: No  Information entered by :: Lisette Abu, LPN   Activities of Daily Living In your present state of health, do you have any difficulty performing the following activities: 05/29/2021 09/27/2020  Hearing? N N  Vision? N N  Difficulty concentrating or making decisions? N N  Walking or climbing stairs? N N  Dressing or bathing? N N  Doing errands, shopping? N N  Preparing Food and eating ? N -  Using the Toilet? N -  In the past six months, have you accidently leaked urine? N -  Do you have problems with loss of bowel control? N -  Managing your Medications? N -  Managing your Finances? N -  Housekeeping or managing your Housekeeping? N -  Some recent data might be hidden    Patient Care Team: [name], MD as PCP - General (Internal Medicine) Stanford Breed Denice Bors, MD as PCP - Cardiology (Cardiology) Charlton Haws, [facility] as Pharmacist (Pharmacist) [place], [place], [place] as Consulting Physician (Optometry)  Indicate any recent Medical Services you may have received from other than [facility] providers in the past year (date may be approximate).     Assessment:   This is a routine wellness examination for [place].  Hearing/Vision screen Hearing Screening - Comments:: Patient denied any hearing difficulty. No hearing aids. Vision Screening - Comments::  Patient wears corrective lenses. Eye exam done by [facility] at [facility] issues and exercise activities discussed: Current Exercise Habits: Home exercise routine, Time (Minutes): 30, Frequency (Times/Week): 7, Weekly Exercise (Minutes/Week): 210, Intensity: Moderate, Exercise limited by: None identified   Goals Addressed             This Visit's Progress    Client will verbalize knowledge of diabetes self-management as evidenced by Hgb A1C <7 or as defined by provider.             Depression Screen PHQ 2/9 Scores 05/29/2021 05/28/2020 05/17/2019 03/02/2018 03/02/2018 01/28/2018 01/18/2018  PHQ - 2 Score 0 0 0 0 0 0 0  PHQ- 9 Score - - - - - - 0    Fall Risk Fall Risk  05/29/2021 05/28/2020 05/17/2019 02/16/2019 03/02/2018  Falls in the past year? 0 0 0 0 1  Comment - - - Emmi Telephone Survey: data to providers prior to load -  Number falls in past yr: 0 0 0 - 0  Injury with Fall? 0 0 0 - 1  Risk for fall due to : No Fall Risks No Fall Risks - - -  Follow up Falls evaluation completed Falls evaluation completed - - -    FALL RISK PREVENTION PERTAINING TO THE HOME:  Any stairs in or around the home? Yes  If so, are there any without handrails? No  Home free of loose throw rugs in walkways, pet beds, electrical cords, etc? Yes  Adequate lighting in your home to reduce risk of falls? Yes   ASSISTIVE DEVICES UTILIZED TO PREVENT FALLS:  Life alert? No  Use of a cane, walker or w/c? No  Grab bars in the bathroom? No  Shower chair or bench in shower? Yes  Elevated toilet seat or a handicapped toilet? No  TIMED UP AND GO:  Was the test performed? Yes .  Length of time to ambulate 10 feet: 6 sec.   Gait steady and fast without use of assistive device  Cognitive Function: Normal cognitive status assessed by direct observation by this Nurse Health Advisor. No abnormalities found.       6CIT Screen 05/29/2021  What Year? 0 points  What month? 0 points  What  time? 0 points  Count back from 20 0 points  Months in reverse 0 points  Repeat phrase 0 points  Total Score 0    Immunizations Immunization History  Administered Date(s) Administered   Fluad Quad(high Dose 65+) 11/12/2018, 01/03/2020   Influenza Split 01/10/2011   Influenza Whole 02/18/2010   Influenza, High Dose Seasonal PF 12/25/2016, 01/04/2018   Influenza-Unspecified 01/05/2013, 01/23/2015, 02/06/2016   PFIZER(Purple Top)SARS-COV-2 Vaccination 04/18/2019, 05/09/2019, 02/04/2020   Pfizer Covid-19 Vaccine Bivalent Booster 87yr & up 10/08/2020   Pneumococcal Conjugate-13 03/28/2015   Pneumococcal Polysaccharide-23 04/30/2016   Tdap 03/24/2004    TDAP status: Due, Education has been provided regarding the importance of this vaccine. Advised may receive this vaccine at local pharmacy or Health Dept. Aware to provide a copy of the vaccination record if obtained from local pharmacy or Health Dept. Verbalized acceptance and understanding.  Flu Vaccine status: Up to date  Pneumococcal vaccine status: Up to date  Covid-19 vaccine status: Completed vaccines  Qualifies for Shingles Vaccine? Yes   Zostavax completed No   Shingrix Completed?: No.    Education has been provided regarding the importance of this vaccine. Patient has been advised to call insurance company to determine out of pocket expense if they have not yet received this vaccine. Advised may also receive vaccine at local pharmacy or Health Dept. Verbalized acceptance and understanding.  Screening Tests Health Maintenance  Topic Date Due   Zoster Vaccines- Shingrix (1 of 2) Never done   TETANUS/TDAP  03/24/2014   FOOT EXAM  03/03/2019   OPHTHALMOLOGY EXAM  01/03/2021   HEMOGLOBIN A1C  09/30/2021   COLONOSCOPY (Pts 45-412yrInsurance coverage will need to be confirmed)  09/28/2024   Pneumonia Vaccine 6533Years old  Completed   INFLUENZA VACCINE  Completed   COVID-19 Vaccine  Completed   Hepatitis C Screening   Completed   HPV VACCINES  Aged Out    Health Maintenance  Health Maintenance Due  Topic Date Due   Zoster Vaccines- Shingrix (1 of 2) Never done   TETANUS/TDAP  03/24/2014   FOOT EXAM  03/03/2019   OPHTHALMOLOGY EXAM  01/03/2021    Colorectal cancer screening: Type of screening: Colonoscopy. Completed 09/29/2014. Repeat every 10 years  Lung Cancer Screening: (Low Dose CT Chest recommended if Age 72-80ears, 30 pack-year currently smoking OR have quit w/in 15years.) does not qualify.   Lung Cancer Screening Referral: no  Additional Screening:  Hepatitis C Screening: does qualify; Completed yes  Vision Screening: Recommended annual ophthalmology exams for early detection of glaucoma and other disorders of the eye. Is the patient up to date with their annual eye exam?  Yes  Who is the provider or what is the name of the office in which the patient attends annual eye exams? [facility] is not [facility] with a provider, would they like to be referred to a provider to establish care? No .   Dental Screening: Recommended annual dental exams for proper oral hygiene  Community Resource Referral / Chronic Care Management: CRR required this visit?  No   CCM required this visit?  No      Plan:     I have personally reviewed and noted the following in the patients chart:   Medical and social history Use of alcohol, tobacco or illicit drugs  Current medications and supplements including opioid prescriptions. Patient is not currently taking opioid prescriptions. Functional ability and status Nutritional status Physical activity Advanced directives List of other physicians Hospitalizations, surgeries, and ER visits in previous 12 months Vitals Screenings to include cognitive, depression, and falls Referrals and appointments  In addition, I have reviewed and discussed with patient certain preventive protocols, quality metrics, and best practice  recommendations. A written personalized care plan for preventive services as well as general preventive health recommendations were provided to patient.     Sheral Flow, LPN   10/28/6809   Nurse Notes:  Hearing Screening - Comments:: Patient denied any hearing difficulty. No hearing aids. Vision Screening - Comments:: Patient wears corrective lenses. [facility] exam done by [facility]

## 2021-05-29 NOTE — Patient Instructions (Signed)
[name] , Thank you for taking time to come for your Medicare Wellness Visit. I appreciate your ongoing commitment to your health goals. Please review the following plan we discussed and let me know if I can assist you in the future.   Screening recommendations/referrals: Colonoscopy: 09/29/2014; due every 10 years Recommended yearly ophthalmology/optometry visit for glaucoma screening and checkup Recommended yearly dental visit for hygiene and checkup  Vaccinations: Influenza vaccine: 02/06/2021 Pneumococcal vaccine: 03/28/2015, 04/30/2016 Tdap vaccine: 03/24/2004; due very 10 years (not covered by Medicare as preventative but will cover as treatment for an injury) Shingles vaccine: never done   Covid-19: 04/18/2019, 05/09/2019, 02/04/2020, 09/28/2020  Advanced directives: Please bring a copy of your health care power of attorney and living will to the office at your convenience.  Conditions/risks identified: Yes; Client understands the importance of follow-up appointments with providers by attending scheduled visits and discussed goals to eat healthier, increase physical activity 5 times a week for 30 minutes each, exercise the brain by doing stimulating brain exercises (reading, adult coloring, crafting, listening to music, puzzles, etc.), socialize and enjoy life more, get enough sleep at least 8-9 hours average per night and make time for laughter.  Next appointment: Please schedule your next Medicare Wellness Visit with your Nurse Health Advisor in 1 year by calling [phone].  Preventive Care 28 Years and Older, Male Preventive care refers to lifestyle choices and visits with your health care provider that can promote health and wellness. What does preventive care include? A yearly physical exam. This is also called an annual well check. Dental exams once or twice a year. Routine eye exams. Ask your health care provider how often you should have your eyes checked. Personal lifestyle  choices, including: Daily care of your teeth and gums. Regular physical activity. Eating a healthy diet. Avoiding tobacco and drug use. Limiting alcohol use. Practicing safe sex. Taking low doses of aspirin every day. Taking vitamin and mineral supplements as recommended by your health care provider. What happens during an annual well check? The services and screenings done by your health care provider during your annual well check will depend on your age, overall health, lifestyle risk factors, and family history of disease. Counseling  Your health care provider may ask you questions about your: Alcohol use. Tobacco use. Drug use. Emotional well-being. Home and relationship well-being. Sexual activity. Eating habits. History of falls. Memory and ability to understand (cognition). Work and work Statistician. Screening  You may have the following tests or measurements: Height, weight, and BMI. Blood pressure. Lipid and cholesterol levels. These may be checked every 5 years, or more frequently if you are over 29 years old. Skin check. Lung cancer screening. You may have this screening every year starting at age 94 if you have a 30-pack-year history of smoking and currently smoke or have quit within the past 15 years. Fecal occult blood test (FOBT) of the stool. You may have this test every year starting at age 41. Flexible sigmoidoscopy or colonoscopy. You may have a sigmoidoscopy every 5 years or a colonoscopy every 10 years starting at age 24. Prostate cancer screening. Recommendations will vary depending on your family history and other risks. Hepatitis C blood test. Hepatitis B blood test. Sexually transmitted disease (STD) testing. Diabetes screening. This is done by checking your blood sugar (glucose) after you have not eaten for a while (fasting). You may have this done every 1-3 years. Abdominal aortic aneurysm (AAA) screening. You may need this if you are  a current or  former smoker. Osteoporosis. You may be screened starting at age 37 if you are at high risk. Talk with your health care provider about your test results, treatment options, and if necessary, the need for more tests. Vaccines  Your health care provider may recommend certain vaccines, such as: Influenza vaccine. This is recommended every year. Tetanus, diphtheria, and acellular pertussis (Tdap, Td) vaccine. You may need a Td booster every 10 years. Zoster vaccine. You may need this after age 39. Pneumococcal 13-valent conjugate (PCV13) vaccine. One dose is recommended after age 88. Pneumococcal polysaccharide (PPSV23) vaccine. One dose is recommended after age 87. Talk to your health care provider about which screenings and vaccines you need and how often you need them. This information is not intended to replace advice given to you by your health care provider. Make sure you discuss any questions you have with your health care provider. Document Released: 04/06/2015 Document Revised: 11/28/2015 Document Reviewed: 01/09/2015 Elsevier Interactive Patient Education  2017 [place] Prevention in the Home Falls can cause injuries. They can happen to people of all ages. There are many things you can do to make your home safe and to help prevent falls. What can I do on the outside of my home? Regularly fix the edges of walkways and driveways and fix any cracks. Remove anything that might make you trip as you walk through a door, such as a raised step or threshold. Trim any bushes or trees on the path to your home. Use bright outdoor lighting. Clear any walking paths of anything that might make someone trip, such as rocks or tools. Regularly check to see if handrails are loose or broken. Make sure that both sides of any steps have handrails. Any raised decks and porches should have guardrails on the edges. Have any leaves, snow, or ice cleared regularly. Use sand or salt on walking paths  during winter. Clean up any spills in your garage right away. This includes oil or grease spills. What can I do in the bathroom? Use night lights. Install grab bars by the toilet and in the tub and shower. Do not use towel bars as grab bars. Use non-skid mats or decals in the tub or shower. If you need to sit down in the shower, use a plastic, non-slip stool. Keep the floor dry. Clean up any water that spills on the floor as soon as it happens. Remove soap buildup in the tub or shower regularly. Attach bath mats securely with double-sided non-slip rug tape. Do not have throw rugs and other things on the floor that can make you trip. What can I do in the bedroom? Use night lights. Make sure that you have a light by your bed that is easy to reach. Do not use any sheets or blankets that are too big for your bed. They should not hang down onto the floor. Have a firm chair that has side arms. You can use this for support while you get dressed. Do not have throw rugs and other things on the floor that can make you trip. What can I do in the kitchen? Clean up any spills right away. Avoid walking on wet floors. Keep items that you use a lot in easy-to-reach places. If you need to reach something above you, use a strong step stool that has a grab bar. Keep electrical cords out of the way. Do not use floor polish or wax that makes floors slippery. If you must  use wax, use non-skid floor wax. Do not have throw rugs and other things on the floor that can make you trip. What can I do with my stairs? Do not leave any items on the stairs. Make sure that there are handrails on both sides of the stairs and use them. Fix handrails that are broken or loose. Make sure that handrails are as long as the stairways. Check any carpeting to make sure that it is firmly attached to the stairs. Fix any carpet that is loose or worn. Avoid having throw rugs at the top or bottom of the stairs. If you do have throw  rugs, attach them to the floor with carpet tape. Make sure that you have a light switch at the top of the stairs and the bottom of the stairs. If you do not have them, ask someone to add them for you. What else can I do to help prevent falls? Wear shoes that: Do not have high heels. Have rubber bottoms. Are comfortable and fit you well. Are closed at the toe. Do not wear sandals. If you use a stepladder: Make sure that it is fully opened. Do not climb a closed stepladder. Make sure that both sides of the stepladder are locked into place. Ask someone to hold it for you, if possible. Clearly mark and make sure that you can see: Any grab bars or handrails. First and last steps. Where the edge of each step is. Use tools that help you move around (mobility aids) if they are needed. These include: Canes. Walkers. Scooters. Crutches. Turn on the lights when you go into a dark area. Replace any light bulbs as soon as they burn out. Set up your furniture so you have a clear path. Avoid moving your furniture around. If any of your floors are uneven, fix them. If there are any pets around you, be aware of where they are. Review your medicines with your doctor. Some medicines can make you feel dizzy. This can increase your chance of falling. Ask your doctor what other things that you can do to help prevent falls. This information is not intended to replace advice given to you by your health care provider. Make sure you discuss any questions you have with your health care provider. Document Released: 01/04/2009 Document Revised: 08/16/2015 Document Reviewed: 04/14/2014 Elsevier Interactive Patient Education  2017 [employer].

## 2021-06-06 ENCOUNTER — Other Ambulatory Visit: Payer: Self-pay | Admitting: Cardiology

## 2021-06-10 ENCOUNTER — Telehealth: Payer: Medicare Other

## 2021-06-17 DIAGNOSIS — H1045 Other chronic allergic conjunctivitis: Secondary | ICD-10-CM | POA: Diagnosis not present

## 2021-06-19 DIAGNOSIS — H1045 Other chronic allergic conjunctivitis: Secondary | ICD-10-CM | POA: Diagnosis not present

## 2021-06-24 ENCOUNTER — Telehealth: Payer: Medicare Other

## 2021-06-24 ENCOUNTER — Other Ambulatory Visit: Payer: Self-pay | Admitting: Cardiology

## 2021-06-24 DIAGNOSIS — I10 Essential (primary) hypertension: Secondary | ICD-10-CM

## 2021-06-25 ENCOUNTER — Encounter: Payer: Self-pay | Admitting: Internal Medicine

## 2021-06-25 NOTE — Progress Notes (Signed)
? ? ?Subjective:  ? ? Patient ID: Jonathon Armstrong, male    DOB: 03-Feb-1951, 71 y.o.   MRN: [id number] ? ?This visit occurred during the SARS-CoV-2 public health emergency.  Safety protocols were in place, including screening questions prior to the visit, additional usage of staff PPE, and extensive cleaning of exam room while observing appropriate contact time as indicated for disinfecting solutions. ? ? ? ?HPI ?Jonathon Armstrong is here for  ?Chief Complaint  ?Patient presents with  ? Nasal Congestion  ? ? ?His symptoms started just over one week he started with conjunctivitis - saw eye doctor and was prescribed a medication, which worked well.  He started having cold symptoms.   ? ?He is experiencing nasal congestion, sore throat.  He denies other symptoms.   ? ?He has tried taking zyrtec, saline nasal spray/neti pot.   ? ? ? ? ?Medications and allergies reviewed with patient and updated if appropriate. ? ?Current Outpatient Medications on File Prior to Visit  ?Medication Sig Dispense Refill  ? albuterol (VENTOLIN HFA) 108 (90 Base) MCG/ACT inhaler Inhale 2 puffs into the lungs every 6 (six) hours as needed for wheezing or shortness of breath. 1 Inhaler 5  ? aspirin EC 81 MG tablet Take 1 tablet (81 mg total) by mouth daily. Swallow whole. 90 tablet 3  ? empagliflozin (JARDIANCE) 25 MG TABS tablet Take 1 tablet (25 mg total) by mouth daily before breakfast. 90 tablet 1  ? labetalol (NORMODYNE) 200 MG tablet TAKE ONE TABLET BY MOUTH TWICE DAILY. 30 tablet 0  ? metFORMIN (GLUCOPHAGE) 500 MG tablet TAKE TWO TABLETS BY MOUTH TWICE DAILY WITH FOOD 360 tablet 1  ? Semaglutide (RYBELSUS) 3 MG TABS Take 3 mg by mouth daily. 30 tablet 2  ? spironolactone (ALDACTONE) 25 MG tablet TAKE 1 TABLET ONCE DAILY. 90 tablet 1  ? telmisartan (MICARDIS) 80 MG tablet TAKE 1 TABLET ONCE DAILY. 90 tablet 2  ? rosuvastatin (CRESTOR) 40 MG tablet Take 1 tablet (40 mg total) by mouth daily. 90 tablet 3  ? ?No current facility-administered  medications on file prior to visit.  ? ? ?Review of Systems  ?Constitutional:  Negative for appetite change and fever.  ?HENT:  Positive for congestion and sore throat (? allergy related). Negative for ear pain, sinus pressure and sinus pain.   ?Respiratory:  Negative for cough, shortness of breath and wheezing.   ?Gastrointestinal:  Negative for abdominal pain, diarrhea and nausea.  ?Musculoskeletal:  Negative for myalgias.  ?Neurological:  Negative for dizziness, light-headedness and headaches.  ? ?   ?Objective:  ? ?Vitals:  ? 06/26/21 0847  ?BP: 120/80  ?Pulse: 68  ?Temp: 98.3 ?F (36.8 ?C)  ?SpO2: 97%  ? ?BP Readings from Last 3 Encounters:  ?06/26/21 120/80  ?05/29/21 110/64  ?04/02/21 110/68  ? ?Wt Readings from Last 3 Encounters:  ?06/26/21 212 lb 3.2 oz (96.3 kg)  ?05/29/21 214 lb 12.8 oz (97.4 kg)  ?04/02/21 213 lb (96.6 kg)  ? ?Body mass index is 30.45 kg/m?. ? ?  ?Physical Exam ?Constitutional:   ?   General: He is not in acute distress. ?   Appearance: Normal appearance. He is not ill-appearing.  ?HENT:  ?   Head: Normocephalic.  ?   Right Ear: Tympanic membrane, ear canal and external ear normal. There is no impacted cerumen.  ?   Left Ear: Tympanic membrane, ear canal and external ear normal. There is no impacted cerumen.  ?   Mouth/Throat:  ?  Mouth: Mucous membranes are moist.  ?   Pharynx: No oropharyngeal exudate or posterior oropharyngeal erythema.  ?Eyes:  ?   Conjunctiva/sclera: Conjunctivae normal.  ?Cardiovascular:  ?   Rate and Rhythm: Normal rate and regular rhythm.  ?Pulmonary:  ?   Effort: Pulmonary effort is normal. No respiratory distress.  ?   Breath sounds: Normal breath sounds. No wheezing or rales.  ?Musculoskeletal:  ?   Cervical back: Neck supple. No tenderness.  ?Lymphadenopathy:  ?   Cervical: No cervical adenopathy.  ?Skin: ?   General: Skin is warm and dry.  ?   Findings: No rash.  ?Neurological:  ?   Mental Status: He is alert.  ? ?   ? ? ? ? ? ?Assessment & Plan:  ? ? ?See  Problem List for Assessment and Plan of chronic medical problems.  ? ? ? ? ?

## 2021-06-25 NOTE — Patient Instructions (Addendum)
? ? ? ?  ? ? ?  Medications changes include :  none  ? ? ? ? ? ?Return if symptoms worsen or fail to improve. ? ?

## 2021-06-26 ENCOUNTER — Ambulatory Visit (INDEPENDENT_AMBULATORY_CARE_PROVIDER_SITE_OTHER): Payer: Medicare Other | Admitting: Student

## 2021-06-26 ENCOUNTER — Encounter: Payer: Self-pay | Admitting: Student

## 2021-06-26 ENCOUNTER — Ambulatory Visit (INDEPENDENT_AMBULATORY_CARE_PROVIDER_SITE_OTHER): Payer: Medicare Other | Admitting: Internal Medicine

## 2021-06-26 VITALS — BP 122/76 | HR 66 | Ht 70.0 in | Wt 212.2 lb

## 2021-06-26 DIAGNOSIS — I10 Essential (primary) hypertension: Secondary | ICD-10-CM

## 2021-06-26 DIAGNOSIS — J019 Acute sinusitis, unspecified: Secondary | ICD-10-CM | POA: Diagnosis not present

## 2021-06-26 DIAGNOSIS — Z87898 Personal history of other specified conditions: Secondary | ICD-10-CM

## 2021-06-26 DIAGNOSIS — E118 Type 2 diabetes mellitus with unspecified complications: Secondary | ICD-10-CM | POA: Diagnosis not present

## 2021-06-26 DIAGNOSIS — R931 Abnormal findings on diagnostic imaging of heart and coronary circulation: Secondary | ICD-10-CM

## 2021-06-26 DIAGNOSIS — E785 Hyperlipidemia, unspecified: Secondary | ICD-10-CM | POA: Diagnosis not present

## 2021-06-26 NOTE — Progress Notes (Signed)
?Cardiology Office Note:   ? ?Date:  06/26/2021  ? ?ID:  Jonathon Armstrong, DOB 08-29-1950, MRN [id number] ? ?PCP:  [name], MD  ?Cardiologist:  Kirk Ruths, MD  ?Electrophysiologist:  None  ? ?Referring MD: [name], MD  ? ?Chief Complaint: routine follow-up of elevated coronary calcium score ? ?History of Present Illness:   ? ?Jonathon Armstrong is a 71 y.o. male with a history of elevated coronary calcium score in 03/2019 with low risk nuclear stress test, remote bradycardia, hypertension, hyperlipidemia, type 2 diabetes, obstructive sleep apnea on CPAP, asthma, and prior lung cancer s/p left lower lobe lobectomy in 07/2019 who is followed by Dr. Stanford Breed presents today for routine follow-up. ? ?Patient was initially referred to Dr. Stanford Breed in 2006-06-13 for evaluation of bradycardia that occurred following nasal polypectomy.  Monitor at that time showed sinus rhythm with brief PAT and PVCs.  Nuclear stress test showed no ischemia.  Last echo in Jun 12, 2008 showed normal LV function and mild MR.  Coronary calcium score in 03/2019 was elevated at 1,368 placing him in the 90th percentile for age and sex.  He underwent a nuclear stress test later that month which was low risk with no evidence of ischemia.  He was also found to have a 13 mm posterior left lower lobe nodule on the CT cardiac scoring and ultimately underwent left lower lobe lobectomy in 07/2019 for lung cancer.  Patient was last seen by Dr. Stanford Breed in 06/12/2020 at which time he was doing well from a cardiac standpoint with no dyspnea on exertion or chest pain. ? ?Patient presents today for follow-up. Here alone.  Patient is doing very well from a cardiac standpoint.  He denies any chest pain, shortness of breath, orthopnea, PND, lower extremity edema, palpitations, lightheadedness, dizziness, syncope.  No recurrent issues with bradycardia.  No abnormal bleeding in urine or stools.  He stays active walking about 3 miles around a walking trail near his  house three times per week. He has no issues with this. He was seen by his PCP earlier today for acute sinusitis which was felt to likely be viral in nature. Otherwise, doing well. ? ?Past Medical History:  ?Diagnosis Date  ? Allergy   ? seasonal  ? Asthma 2017/06/12  ? Asystole ([place])   ? "vagal response"post nasal polypectomy  ? Complication of anesthesia 2004-06-12  ? Coded x2 after nasal polyp removal (Believes vagal nerve response)  ? Diabetes mellitus without complication ([place])   ? pre but take metformin  ? Diverticulosis of colon   ? FH: colonic polyps   ? hx of  ? Hyperlipidemia   ? Hypertension   ? Mitral regurgitation   ? mild & aortic Valve calcification on 2d echo;sbe prophylaxis  ? Parotitis, acute 06/13/2011  ? Seasonal rhinitis   ? Sleep apnea   ? CPAP  ? ? ?Past Surgical History:  ?Procedure Laterality Date  ? APPENDECTOMY    ? asytole post nasal polypectomy  13-Jun-2006  ? colonoscopy with polypectomy  06-12-2008  ? Dr Olevia Perches  ? heriorraphy    ? x2  ? HERNIA REPAIR    ? INTERCOSTAL NERVE BLOCK Left 08/16/2019  ? Procedure: Intercostal Nerve Block;  Surgeon: Lajuana Matte, MD;  Location: [facility];  Service: Thoracic;  Laterality: Left;  ? MIDDLE EAR SURGERY    ? TM replacement  ? NASAL SINUS SURGERY  June 12, 1992, [id number]  ? X3  ? NODE DISSECTION  08/16/2019  ? Procedure:  Node Dissection;  Surgeon: Lajuana Matte, MD;  Location: [place];  Service: Thoracic;;  ? TONSILLECTOMY AND ADENOIDECTOMY    ? VIDEO BRONCHOSCOPY N/A 08/16/2019  ? Procedure: VIDEO BRONCHOSCOPY;  Surgeon: Lajuana Matte, MD;  Location: [place];  Service: Thoracic;  Laterality: N/A;  ? ? ?Current Medications: ?Current Meds  ?Medication Sig  ? albuterol (VENTOLIN HFA) 108 (90 Base) MCG/ACT inhaler Inhale 2 puffs into the lungs every 6 (six) hours as needed for wheezing or shortness of breath.  ? aspirin EC 81 MG tablet Take 1 tablet (81 mg total) by mouth daily. Swallow whole.  ? empagliflozin (JARDIANCE) 25 MG TABS tablet Take 1 tablet (25 mg total) by mouth  daily before breakfast.  ? labetalol (NORMODYNE) 200 MG tablet TAKE ONE TABLET BY MOUTH TWICE DAILY.  ? metFORMIN (GLUCOPHAGE) 500 MG tablet TAKE TWO TABLETS BY MOUTH TWICE DAILY WITH FOOD  ? rosuvastatin (CRESTOR) 40 MG tablet Take 1 tablet (40 mg total) by mouth daily.  ? Semaglutide (RYBELSUS) 3 MG TABS Take 3 mg by mouth daily.  ? spironolactone (ALDACTONE) 25 MG tablet TAKE 1 TABLET ONCE DAILY.  ? telmisartan (MICARDIS) 80 MG tablet TAKE 1 TABLET ONCE DAILY.  ?  ? ?Allergies:   Diltiazem hcl and Penicillins  ? ?Social History  ? ?Socioeconomic History  ? Marital status: Married  ?  Spouse name: Not on file  ? Number of children: 2  ? Years of education: 23  ? Highest education level: Not on file  ?Occupational History  ? Occupation: Retired  ?  Employer: RETIRED  ?Tobacco Use  ? Smoking status: Never  ? Smokeless tobacco: Never  ?Vaping Use  ? Vaping Use: Never used  ?Substance and Sexual Activity  ? Alcohol use: Yes  ?  Alcohol/week: 2.0 standard drinks  ?  Types: 1 Glasses of wine, 1 Shots of liquor per week  ?  Comment: socially < 2/ week  ? Drug use: No  ? Sexual activity: Yes  ?Other Topics Concern  ? Not on file  ?Social History Narrative  ? Fun: Water, swimming, [employer]; Psychologist, occupational work   ? Denies abuse and feels safe at home.  ? Regular exercise  ?   ? ?Social Determinants of Health  ? ?Financial Resource Strain: Low Risk   ? Difficulty of Paying Living Expenses: Not hard at all  ?Food Insecurity: No Food Insecurity  ? Worried About Charity fundraiser in the Last Year: Never true  ? Ran Out of Food in the Last Year: Never true  ?Transportation Needs: No Transportation Needs  ? Lack of Transportation (Medical): No  ? Lack of Transportation (Non-Medical): No  ?Physical Activity: Sufficiently Active  ? Days of Exercise per Week: 5 days  ? Minutes of Exercise per Session: 30 min  ?Stress: No Stress Concern Present  ? Feeling of Stress : Not at all  ?Social Connections: Socially Integrated  ? Frequency of  Communication with Friends and Family: More than three times a week  ? Frequency of Social Gatherings with Friends and Family: More than three times a week  ? Attends Religious Services: More than 4 times per year  ? Active Member of Clubs or Organizations: Yes  ? Attends Archivist Meetings: More than 4 times per year  ? Marital Status: Married  ?  ? ?Family History: ?The patient's family history includes COPD in his sister; Cancer in his maternal grandfather and mother; Dementia in his father; Heart disease  in his father and mother; Hyperlipidemia in his mother; Hypertension in his father; Other in his sister and sister. There is no history of Diabetes, Stroke, or Colon cancer. ? ?ROS:   ?Please see the history of present illness.    ? ?EKGs/Labs/Other Studies Reviewed:   ? ?The following studies were reviewed today: ? ?CT Cardiac Scoring 03/31/2019: ?Impression: ?- Coronary calcium score of 1368. This was 90th percentile for age and ?sex matched control. ?- Recommend aggressive risk factor modification. ?_______________ ? ?Lexiscan Myoview 04/21/2019: ?The left ventricular ejection fraction is normal (55-65%). ?Nuclear stress EF: 58%. ?There was no ST segment deviation noted during stress. ?No T wave inversion was noted during stress. ?The study is normal. ?This is a low risk study. ?  ?Low risk stress nuclear study with normal perfusion and normal left ventricular regional and global systolic function. ? ? ?EKG:  EKG ordered today. EKG personally reviewed and demonstrates normal sinus rhythm, rate 66 bpm, with 1st degree AV block but no acute ST/T changes. Normal axis. QTc 406 ms. ? ?Recent Labs: ?04/02/2021: ALT 20; BUN 26; Creatinine, Ser 1.35; Hemoglobin 11.5; Platelets 124.0; Potassium 4.6; Sodium 137  ?Recent Lipid Panel ?   ?Component Value Date/Time  ? CHOL 102 04/02/2021 0827  ? CHOL 99 (L) 09/20/2020 0829  ? CHOL 153 07/27/2012 0817  ? TRIG 282.0 (H) 04/02/2021 0827  ? TRIG 300 (H) 07/27/2012 0817   ? TRIG 171 (H) 03/12/2006 0836  ? HDL 34.40 (L) 04/02/2021 0827  ? HDL 34 (L) 09/20/2020 0829  ? HDL 34 (L) 07/27/2012 0817  ? CHOLHDL 3 04/02/2021 0827  ? VLDL 56.4 (H) 04/02/2021 0827  ? [place] 32 06/

## 2021-06-26 NOTE — Patient Instructions (Signed)
Medication Instructions:  No Changes *If you need a refill on your cardiac medications before your next appointment, please call your pharmacy*   Lab Work: No Labs If you have labs (blood work) drawn today and your tests are completely normal, you will receive your results only by: . MyChart Message (if you have MyChart) OR . A paper copy in the mail If you have any lab test that is abnormal or we need to change your treatment, we will call you to review the results.   Testing/Procedures: No Testing   Follow-Up: At [facility], you and your health needs are our priority.  As part of our continuing mission to provide you with exceptional heart care, we have created designated Provider Care Teams.  These Care Teams include your primary Cardiologist (physician) and Advanced Practice Providers (APPs -  Physician Assistants and Nurse Practitioners) who all work together to provide you with the care you need, when you need it.  We recommend signing up for the patient portal called "MyChart".  Sign up information is provided on this After Visit Summary.  MyChart is used to connect with patients for Virtual Visits (Telemedicine).  Patients are able to view lab/test results, encounter notes, upcoming appointments, etc.  Non-urgent messages can be sent to your provider as well.   To learn more about what you can do with MyChart, go to [email or web address].    Your next appointment:   1 year(s)  The format for your next appointment:   In Person  Provider:   Brian Crenshaw, MD   

## 2021-06-26 NOTE — Assessment & Plan Note (Signed)
Acute ?Likely viral in nature ?Continue zyrtec, saline nasal spray ?Can try mucinex ?Rest, fluids ?Call if no improvement ?

## 2021-07-01 ENCOUNTER — Encounter: Payer: Self-pay | Admitting: Internal Medicine

## 2021-07-02 ENCOUNTER — Encounter: Payer: Self-pay | Admitting: Cardiology

## 2021-07-02 ENCOUNTER — Other Ambulatory Visit: Payer: Self-pay | Admitting: Cardiology

## 2021-07-02 DIAGNOSIS — R931 Abnormal findings on diagnostic imaging of heart and coronary circulation: Secondary | ICD-10-CM

## 2021-07-02 DIAGNOSIS — E7800 Pure hypercholesterolemia, unspecified: Secondary | ICD-10-CM

## 2021-07-02 MED ORDER — LABETALOL HCL 200 MG PO TABS: 200.0000 mg | ORAL_TABLET | Freq: Two times a day (BID) | ORAL | 3 refills | Status: DC

## 2021-07-04 ENCOUNTER — Ambulatory Visit: Payer: Medicare Other | Admitting: Student

## 2021-07-11 ENCOUNTER — Ambulatory Visit (INDEPENDENT_AMBULATORY_CARE_PROVIDER_SITE_OTHER): Payer: Medicare Other

## 2021-07-11 DIAGNOSIS — E782 Mixed hyperlipidemia: Secondary | ICD-10-CM

## 2021-07-11 DIAGNOSIS — E1165 Type 2 diabetes mellitus with hyperglycemia: Secondary | ICD-10-CM

## 2021-07-11 DIAGNOSIS — I10 Essential (primary) hypertension: Secondary | ICD-10-CM

## 2021-07-11 NOTE — Progress Notes (Signed)
? ?Chronic Care Management ?Pharmacy Note ? ?07/11/2021 ?Name:  Jonathon Armstrong MRN:  [id number] DOB:  30-Dec-1950 ? ?Summary: ?-Patient endorses compliance to current medications, denies any issues with medications at this time  ?-Notes that he checks blood pressure on occasion and is always 120/80 or less  ? ?Recommendations/Changes made from today's visit: ?-Patient is scheduled for follow up with PCP next month, will complete labwork 5/8  ?-Should A1c remain elevated, likely would see improvement in BG control from increasing rybelsus - patient has tolerated well since starting denies any issues  ?-Per cardiology note, reasonable to consider adding vascepa should TG remain elevated  ? ? ?Subjective: ?Jonathon Armstrong is an 71 y.o. year old male who is a primary patient of Burns, Claudina Lick, MD.  The CCM team was consulted for assistance with disease management and care coordination needs.   ? ?Engaged with patient by telephone for follow up visit in response to provider referral for pharmacy case management and/or care coordination services.  ? ?Consent to Services:  ?The patient was given information about Chronic Care Management services, agreed to services, and gave verbal consent prior to initiation of services.  Please see initial visit note for detailed documentation.  ? ?Patient Care Team: ?[name], MD as PCP - General (Internal Medicine) ?Lelon Perla, MD as PCP - Cardiology (Cardiology) ?Foltanski, Cleaster Corin, [facility] as Pharmacist (Pharmacist) ?[place], Heather, [place] as Consulting Physician (Optometry) ? ? ?Patient lives at home with wife of 15 years. He has 2 children and 5 grandchildren living in [place], he is a very active grandparent and visits often. He also volunteers in his church. ? ?Recent office visits: ?06/26/2021 - Dr. Quay Burow - acute sinusitis - can try mucinex, no changes to medications  ?04/02/2021 - Dr. Quay Burow - no changes to medications - f/u in 4 months   ?03/15/2022 - Dr. Jenny Reichmann - Acute cough - rx'd levofloxacin, prednisone, and hycodan cough syrup  ?03/05/2021 - Dr. Quay Burow - Acute sinusitis - rx'd azithromycin and hycodan cough syrup ? ?Recent consult visits: ?06/26/2021 -Sande Rives PA-C - Cardiology - should TG remain elevated, consider vascepa, otherwise no changes to medications - f/u in 1 year  ?04/12/2021 - Dr. Kipp Brood - Cardiothoracic surgery - reviewed results of recent CT scan - no evidence of pulmonary nodules or recurrent disease - noted 4.1 cm ascending aortic aneurysm - follow up with another CT scan in 6 months  ? ?Hospital visits: ?03/01/2021 - Urgent care visit - bacterial conjunctivitis - rx'd polytrim ? ?Objective: ? ?Lab Results  ?Component Value Date  ? CREATININE 1.35 04/02/2021  ? BUN 26 (H) 04/02/2021  ? GFR 52.76 (L) 04/02/2021  ? GFRNONAA 47 (L) 08/18/2019  ? GFRAA 54 (L) 08/18/2019  ? NA 137 04/02/2021  ? K 4.6 04/02/2021  ? CALCIUM 10.1 04/02/2021  ? CO2 23 04/02/2021  ? GLUCOSE 137 (H) 04/02/2021  ? ? ?Lab Results  ?Component Value Date/Time  ? HGBA1C 7.7 (H) 04/02/2021 08:27 AM  ? HGBA1C 7.7 (H) 11/13/2020 08:57 AM  ? GFR 52.76 (L) 04/02/2021 08:27 AM  ? GFR 62.23 09/27/2020 09:46 AM  ? MICROALBUR 1.3 04/30/2016 10:55 AM  ? MICROALBUR <0.7 09/18/2014 09:16 AM  ?  ?Last diabetic Eye exam:  ?Lab Results  ?Component Value Date/Time  ? HMDIABEYEEXA No Retinopathy 01/04/2020 12:00 AM  ?  ?Last diabetic Foot exam: No results found for: HMDIABFOOTEX  ? ?Lab Results  ?Component Value Date  ?  CHOL 102 04/02/2021  ? HDL 34.40 (L) 04/02/2021  ? [place] 32 09/20/2020  ? LDLDIRECT 31.0 04/02/2021  ? TRIG 282.0 (H) 04/02/2021  ? CHOLHDL 3 04/02/2021  ? ? ? ?  Latest Ref Rng & Units 04/02/2021  ?  8:27 AM 09/20/2020  ?  8:29 AM 01/03/2020  ?  9:25 AM  ?Hepatic Function  ?Total Protein 6.0 - 8.3 g/dL 7.0   6.7   7.2    ?Albumin 3.5 - 5.2 g/dL 4.6   4.8   4.7    ?AST 0 - 37 U/L '16   20   15    ' ?ALT 0 - 53 U/L '20   23   16    ' ?Alk Phosphatase 39 - 117 U/L  60   75   70    ?Total Bilirubin 0.2 - 1.2 mg/dL 0.5   0.3   0.5    ?Bilirubin, Direct 0.00 - 0.40 mg/dL  0.11     ? ? ?Lab Results  ?Component Value Date/Time  ? TSH 1.41 11/12/2018 09:34 AM  ? TSH 1.42 07/24/2017 09:02 AM  ? ? ? ?  Latest Ref Rng & Units 04/02/2021  ?  8:27 AM 11/13/2020  ?  8:57 AM 09/27/2020  ?  9:46 AM  ?CBC  ?WBC 4.0 - 10.5 K/uL 6.6   5.7   5.1    ?Hemoglobin 13.0 - 17.0 g/dL 11.5   11.5   11.6    ?Hematocrit 39.0 - 52.0 % 34.5   32.5   33.2    ?Platelets 150.0 - 400.0 K/uL 124.0   136.0   131.0    ? ? ?No results found for: VD25OH ? ?Clinical ASCVD: Yes  ?The ASCVD Risk score (Arnett DK, et al., 2019) failed to calculate for the following reasons: ?  The valid total cholesterol range is 130 to 320 mg/dL   ? ? ?  05/29/2021  ? 10:45 AM 05/28/2020  ? 10:23 AM 05/17/2019  ?  9:03 AM  ?Depression screen PHQ 2/9  ?Decreased Interest 0 0 0  ?Down, Depressed, Hopeless 0 0 0  ?PHQ - 2 Score 0 0 0  ?  ? ?Social History  ? ?Tobacco Use  ?Smoking Status Never  ?Smokeless Tobacco Never  ? ?BP Readings from Last 3 Encounters:  ?06/26/21 122/76  ?06/26/21 120/80  ?05/29/21 110/64  ? ?Pulse Readings from Last 3 Encounters:  ?06/26/21 66  ?06/26/21 68  ?05/29/21 69  ? ?Wt Readings from Last 3 Encounters:  ?06/26/21 212 lb 3.2 oz (96.3 kg)  ?06/26/21 212 lb 3.2 oz (96.3 kg)  ?05/29/21 214 lb 12.8 oz (97.4 kg)  ? ?BMI Readings from Last 3 Encounters:  ?06/26/21 30.45 kg/m?  ?06/26/21 30.45 kg/m?  ?05/29/21 30.82 kg/m?  ? ? ?Assessment/Interventions: Review of patient past medical history, allergies, medications, health status, including review of consultants reports, laboratory and other test data, was performed as part of comprehensive evaluation and provision of chronic care management services.  ? ?SDOH:  (Social Determinants of Health) assessments and interventions performed: Yes ? ?SDOH Screenings  ? ?Alcohol Screen: Low Risk   ? Last Alcohol Screening Score (AUDIT): 3  ?Depression (PHQ2-9): Low Risk   ? PHQ-2  Score: 0  ?Financial Resource Strain: Low Risk   ? Difficulty of Paying Living Expenses: Not hard at all  ?Food Insecurity: No Food Insecurity  ? Worried About Charity fundraiser in the Last Year: Never true  ? Ran Out of Food  in the Last Year: Never true  ?Housing: Low Risk   ? Last Housing Risk Score: 0  ?Physical Activity: Sufficiently Active  ? Days of Exercise per Week: 5 days  ? Minutes of Exercise per Session: 30 min  ?Social Connections: Socially Integrated  ? Frequency of Communication with Friends and Family: More than three times a week  ? Frequency of Social Gatherings with Friends and Family: More than three times a week  ? Attends Religious Services: More than 4 times per year  ? Active Member of Clubs or Organizations: Yes  ? Attends Archivist Meetings: More than 4 times per year  ? Marital Status: Married  ?Stress: No Stress Concern Present  ? Feeling of Stress : Not at all  ?Tobacco Use: Low Risk   ? Smoking Tobacco Use: Never  ? Smokeless Tobacco Use: Never  ? Passive Exposure: Not on file  ?Transportation Needs: No Transportation Needs  ? Lack of Transportation (Medical): No  ? Lack of Transportation (Non-Medical): No  ? ? ?CCM Care Plan ? ?Allergies  ?Allergen Reactions  ? Diltiazem Hcl Other (See Comments) and Cough  ?  ? symptoms  ? Penicillins   ?  Rash with Amoxicillin with Mono in college  ? ? ?Medications Reviewed Today   ? ? Reviewed by Tomasa Blase, [facility] (Pharmacist) on 07/11/21 at [place] List Status: <None>  ? ?Medication Order Taking? Sig Documenting Provider Last Dose Status Informant  ?albuterol (VENTOLIN HFA) 108 (90 Base) MCG/ACT inhaler [id number]  Inhale 2 puffs into the lungs every 6 (six) hours as needed for wheezing or shortness of breath. Chesley Mires, MD  Active Self  ?aspirin EC 81 MG tablet [id number] Yes Take 1 tablet (81 mg total) by mouth daily. Swallow whole. Lelon Perla, MD Taking Active   ?empagliflozin (JARDIANCE) 25 MG TABS tablet [id number] Yes  Take 1 tablet (25 mg total) by mouth daily before breakfast. [name], MD Taking Active   ?labetalol (NORMODYNE) 200 MG tablet [id number] Yes Take 1 tablet (200 mg total) by mouth 2 (two) times daily. Cr

## 2021-07-11 NOTE — Patient Instructions (Signed)
Visit Information ? ?Following are the goals we discussed today:  ? ?Manage My Medicine  ? ?Timeframe:  Long-Range Goal ?Priority:  Medium ?Start Date:   07/11/2021                          ?Expected End Date:   07/12/2022                   ? ?Follow Up Date 12/2021 ?  ?- call for medicine refill 2 or 3 days before it runs out ?- call if I am sick and can't take my medicine ?- keep a list of all the medicines I take; vitamins and herbals too ?- learn to read medicine labels  ?  ?Why is this important?   ?These steps will help you keep on track with your medicines. ? ?Plan: Telephone follow up appointment with care management team member scheduled for:  6-8 months ?The patient has been provided with contact information for the care management team and has been advised to call with any health related questions or concerns.  ? ?Tomasa Blase, PharmD ?Clinical Pharmacist, [place]  ? ?Please call the care guide team at [phone] if you need to cancel or reschedule your appointment.  ? ?Patient verbalizes understanding of instructions and care plan provided today and agrees to view in [place]. Active MyChart status confirmed with patient.   ? ?

## 2021-07-12 ENCOUNTER — Other Ambulatory Visit: Payer: Self-pay | Admitting: Internal Medicine

## 2021-07-21 DIAGNOSIS — E1165 Type 2 diabetes mellitus with hyperglycemia: Secondary | ICD-10-CM

## 2021-07-21 DIAGNOSIS — E782 Mixed hyperlipidemia: Secondary | ICD-10-CM

## 2021-07-21 DIAGNOSIS — I10 Essential (primary) hypertension: Secondary | ICD-10-CM

## 2021-07-27 ENCOUNTER — Encounter: Payer: Self-pay | Admitting: Internal Medicine

## 2021-07-31 ENCOUNTER — Ambulatory Visit: Payer: Medicare Other | Admitting: Internal Medicine

## 2021-08-05 ENCOUNTER — Other Ambulatory Visit (INDEPENDENT_AMBULATORY_CARE_PROVIDER_SITE_OTHER): Payer: Medicare Other

## 2021-08-05 DIAGNOSIS — I10 Essential (primary) hypertension: Secondary | ICD-10-CM | POA: Diagnosis not present

## 2021-08-05 DIAGNOSIS — E782 Mixed hyperlipidemia: Secondary | ICD-10-CM

## 2021-08-05 DIAGNOSIS — D696 Thrombocytopenia, unspecified: Secondary | ICD-10-CM

## 2021-08-05 DIAGNOSIS — D649 Anemia, unspecified: Secondary | ICD-10-CM

## 2021-08-05 DIAGNOSIS — E1165 Type 2 diabetes mellitus with hyperglycemia: Secondary | ICD-10-CM

## 2021-08-05 LAB — CBC WITH DIFFERENTIAL/PLATELET
Basophils Absolute: 0 K/uL (ref 0.0–0.1)
Basophils Relative: 0.7 % (ref 0.0–3.0)
Eosinophils Absolute: 0.1 K/uL (ref 0.0–0.7)
Eosinophils Relative: 2.1 % (ref 0.0–5.0)
HCT: 32.8 % — ABNORMAL LOW (ref 39.0–52.0)
Hemoglobin: 11.3 g/dL — ABNORMAL LOW (ref 13.0–17.0)
Lymphocytes Relative: 22.6 % (ref 12.0–46.0)
Lymphs Abs: 1.5 K/uL (ref 0.7–4.0)
MCHC: 34.4 g/dL (ref 30.0–36.0)
MCV: 87.7 fl (ref 78.0–100.0)
Monocytes Absolute: 0.4 K/uL (ref 0.1–1.0)
Monocytes Relative: 6.6 % (ref 3.0–12.0)
Neutro Abs: 4.4 K/uL (ref 1.4–7.7)
Neutrophils Relative %: 68 % (ref 43.0–77.0)
Platelets: 139 K/uL — ABNORMAL LOW (ref 150.0–400.0)
RBC: 3.74 Mil/uL — ABNORMAL LOW (ref 4.22–5.81)
RDW: 14.5 % (ref 11.5–15.5)
WBC: 6.4 K/uL (ref 4.0–10.5)

## 2021-08-05 LAB — COMPREHENSIVE METABOLIC PANEL WITH GFR
ALT: 16 U/L (ref 0–53)
AST: 16 U/L (ref 0–37)
Albumin: 4.8 g/dL (ref 3.5–5.2)
Alkaline Phosphatase: 61 U/L (ref 39–117)
BUN: 26 mg/dL — ABNORMAL HIGH (ref 6–23)
CO2: 22 meq/L (ref 19–32)
Calcium: 10 mg/dL (ref 8.4–10.5)
Chloride: 108 meq/L (ref 96–112)
Creatinine, Ser: 1.32 mg/dL (ref 0.40–1.50)
GFR: 54.07 mL/min — ABNORMAL LOW (ref >60.00)
Glucose, Bld: 127 mg/dL — ABNORMAL HIGH (ref 70–99)
Potassium: 4.4 meq/L (ref 3.5–5.1)
Sodium: 140 meq/L (ref 135–145)
Total Bilirubin: 0.4 mg/dL (ref 0.2–1.2)
Total Protein: 7 g/dL (ref 6.0–8.3)

## 2021-08-05 LAB — LIPID PANEL
Cholesterol: 88 mg/dL (ref 0–200)
HDL: 30.2 mg/dL — ABNORMAL LOW (ref >39.00)
NonHDL: 57.74
Total CHOL/HDL Ratio: 3
Triglycerides: 224 mg/dL — ABNORMAL HIGH (ref 0.0–149.0)
VLDL: 44.8 mg/dL — ABNORMAL HIGH (ref 0.0–40.0)

## 2021-08-05 LAB — LDL CHOLESTEROL, DIRECT: Direct LDL: 28 mg/dL

## 2021-08-05 LAB — FOLATE: Folate: 12.3 ng/mL (ref >5.9)

## 2021-08-05 LAB — IBC PANEL
Iron: 47 ug/dL (ref 42–165)
Saturation Ratios: 10.9 % — ABNORMAL LOW (ref 20.0–50.0)
TIBC: 432.6 ug/dL (ref 250.0–450.0)
Transferrin: 309 mg/dL (ref 212.0–360.0)

## 2021-08-05 LAB — HEMOGLOBIN A1C: Hgb A1c MFr Bld: 6.8 % — ABNORMAL HIGH (ref 4.6–6.5)

## 2021-08-05 LAB — VITAMIN B12: Vitamin B-12: 160 pg/mL — ABNORMAL LOW (ref 211–911)

## 2021-08-06 ENCOUNTER — Encounter: Payer: Self-pay | Admitting: Internal Medicine

## 2021-08-06 ENCOUNTER — Other Ambulatory Visit: Payer: Self-pay | Admitting: Internal Medicine

## 2021-08-06 DIAGNOSIS — E538 Deficiency of other specified B group vitamins: Secondary | ICD-10-CM | POA: Insufficient documentation

## 2021-08-06 NOTE — Progress Notes (Signed)
? ? ? ? ?Subjective:  ? ? Patient ID: Jonathon Armstrong, male    DOB: Mar 21, 1951, 71 y.o.   MRN: [id number] ? ? ? ? ?HPI ?Jonathon Armstrong is here for follow up of his chronic medical problems, including DM, htn, hld, B12 def, anemia ? ?He had blood work done prior to coming here and we did review the blood work. ? ?He continues to eat well and is fairly compliant with a diabetic diet, but does indulge at times.  He is exercising regularly. ? ?He is taking his medications as prescribed. ? ?Medications and allergies reviewed with patient and updated if appropriate. ? ?Current Outpatient Medications on File Prior to Visit  ?Medication Sig Dispense Refill  ? aspirin EC 81 MG tablet Take 1 tablet (81 mg total) by mouth daily. Swallow whole. 90 tablet 3  ? empagliflozin (JARDIANCE) 25 MG TABS tablet Take 1 tablet (25 mg total) by mouth daily before breakfast. 90 tablet 1  ? labetalol (NORMODYNE) 200 MG tablet Take 1 tablet (200 mg total) by mouth 2 (two) times daily. 180 tablet 3  ? metFORMIN (GLUCOPHAGE) 500 MG tablet TAKE TWO TABLETS BY MOUTH TWICE DAILY WITH FOOD 360 tablet 1  ? rosuvastatin (CRESTOR) 40 MG tablet Take 1 tablet (40 mg total) by mouth daily. 90 tablet 3  ? RYBELSUS 3 MG TABS TAKE ONE TABLET BY MOUTH ONCE DAILY 30 tablet 2  ? spironolactone (ALDACTONE) 25 MG tablet TAKE ONE TABLET ONCE DAILY 90 tablet 1  ? telmisartan (MICARDIS) 80 MG tablet TAKE 1 TABLET ONCE DAILY. 90 tablet 2  ? albuterol (VENTOLIN HFA) 108 (90 Base) MCG/ACT inhaler Inhale 2 puffs into the lungs every 6 (six) hours as needed for wheezing or shortness of breath. (Patient not taking: Reported on 07/11/2021) 1 Inhaler 5  ? ?No current facility-administered medications on file prior to visit.  ? ? ? ?Review of Systems  ?Constitutional:  Negative for fever.  ?Respiratory:  Negative for cough, shortness of breath and wheezing.   ?Cardiovascular:  Negative for chest pain, palpitations and leg swelling.  ?Neurological:  Negative for light-headedness  and headaches.  ? ?   ?Objective:  ? ?Vitals:  ? 08/07/21 0933  ?BP: 116/74  ?Pulse: 73  ?Temp: 98.3 ?F (36.8 ?C)  ?SpO2: 97%  ? ?BP Readings from Last 3 Encounters:  ?08/07/21 116/74  ?06/26/21 122/76  ?06/26/21 120/80  ? ?Wt Readings from Last 3 Encounters:  ?08/07/21 214 lb (97.1 kg)  ?06/26/21 212 lb 3.2 oz (96.3 kg)  ?06/26/21 212 lb 3.2 oz (96.3 kg)  ? ?Body mass index is 30.71 kg/m?. ? ?  ?Physical Exam ?Constitutional:   ?   General: He is not in acute distress. ?   Appearance: Normal appearance. He is not ill-appearing.  ?HENT:  ?   Head: Normocephalic and atraumatic.  ?Eyes:  ?   Conjunctiva/sclera: Conjunctivae normal.  ?Cardiovascular:  ?   Rate and Rhythm: Normal rate and regular rhythm.  ?   Heart sounds: Normal heart sounds. No murmur heard. ?Pulmonary:  ?   Effort: Pulmonary effort is normal. No respiratory distress.  ?   Breath sounds: Normal breath sounds. No wheezing or rales.  ?Musculoskeletal:  ?   Right lower leg: No edema.  ?   Left lower leg: No edema.  ?Skin: ?   General: Skin is warm and dry.  ?   Findings: No rash.  ?Neurological:  ?   Mental Status: He is alert. Mental status is at baseline.  ?  Psychiatric:     ?   Mood and Affect: Mood normal.  ? ?   ? ?Lab Results  ?Component Value Date  ? WBC 6.4 08/05/2021  ? HGB 11.3 (L) 08/05/2021  ? HCT 32.8 (L) 08/05/2021  ? PLT 139.0 (L) 08/05/2021  ? GLUCOSE 127 (H) 08/05/2021  ? CHOL 88 08/05/2021  ? TRIG 224.0 (H) 08/05/2021  ? HDL 30.20 (L) 08/05/2021  ? LDLDIRECT 28.0 08/05/2021  ? [place] 32 09/20/2020  ? ALT 16 08/05/2021  ? AST 16 08/05/2021  ? NA 140 08/05/2021  ? K 4.4 08/05/2021  ? CL 108 08/05/2021  ? CREATININE 1.32 08/05/2021  ? BUN 26 (H) 08/05/2021  ? CO2 22 08/05/2021  ? TSH 1.41 11/12/2018  ? PSA 2.61 11/12/2018  ? INR 1.0 08/15/2019  ? HGBA1C 6.8 (H) 08/05/2021  ? MICROALBUR 1.3 04/30/2016  ? ? ? ?Assessment & Plan:  ? ? ?See Problem List for Assessment and Plan of chronic medical problems.  ? ? ?

## 2021-08-07 ENCOUNTER — Ambulatory Visit (INDEPENDENT_AMBULATORY_CARE_PROVIDER_SITE_OTHER): Payer: Medicare Other | Admitting: Internal Medicine

## 2021-08-07 VITALS — BP 116/74 | HR 73 | Temp 98.3°F | Ht 70.0 in | Wt 214.0 lb

## 2021-08-07 DIAGNOSIS — E538 Deficiency of other specified B group vitamins: Secondary | ICD-10-CM | POA: Diagnosis not present

## 2021-08-07 DIAGNOSIS — E782 Mixed hyperlipidemia: Secondary | ICD-10-CM | POA: Diagnosis not present

## 2021-08-07 DIAGNOSIS — I10 Essential (primary) hypertension: Secondary | ICD-10-CM

## 2021-08-07 DIAGNOSIS — R931 Abnormal findings on diagnostic imaging of heart and coronary circulation: Secondary | ICD-10-CM

## 2021-08-07 DIAGNOSIS — E1165 Type 2 diabetes mellitus with hyperglycemia: Secondary | ICD-10-CM | POA: Diagnosis not present

## 2021-08-07 DIAGNOSIS — D649 Anemia, unspecified: Secondary | ICD-10-CM | POA: Diagnosis not present

## 2021-08-07 MED ORDER — CYANOCOBALAMIN 1000 MCG/ML IJ SOLN
1000.0000 ug | Freq: Once | INTRAMUSCULAR | Status: AC
  Administered 2021-08-07: 1000 ug via INTRAMUSCULAR

## 2021-08-07 MED ORDER — RYBELSUS 7 MG PO TABS: 7.0000 mg | ORAL_TABLET | Freq: Every day | ORAL | 1 refills | Status: DC

## 2021-08-07 NOTE — Assessment & Plan Note (Addendum)
New ?B12 level very low ?Asymptomatic ??  Cause of mild anemia and low platelets ?Discussed supplementation orally, injection and both ?Start oral B12 1000 mcg daily ?B12 injection today and then every 2 weeks x 4 ?We will recheck B12 level at his next visit ?

## 2021-08-07 NOTE — Assessment & Plan Note (Addendum)
Chronic ?Lab Results  ?Component Value Date  ? HGBA1C 6.8 (H) 08/05/2021  ? ?Sugars  controlled ?Continue Jardiance 25 mg daily, metformin 1000 mg twice daily.  Will increase Rybelsus to 7 mg daily ?Continue regular exercise, diabetic diet ? ? ?

## 2021-08-07 NOTE — Assessment & Plan Note (Signed)
Chronic ?Slightly worse mild anemia ?Iron levels low normal, B12 very low, folate normal ?Occasional blood with bowel movements that sounds hemorrhoidal, but he will monitor ?Will replace B12 and repeat blood work in 3 months ?

## 2021-08-07 NOTE — Assessment & Plan Note (Signed)
Chronic ?Regular exercise and healthy diet encouraged ?Lab Results  ?Component Value Date  ? [place] 32 09/20/2020  ? ?LDL at goal ?Continue rosuvastatin 40 mg daily ?

## 2021-08-07 NOTE — Assessment & Plan Note (Signed)
Chronic ?Blood pressure well controlled ?CMP reviewed ?Continue labetalol 200 mg twice daily, spironolactone 25 mg daily, telmisartan 80 mg daily ?

## 2021-08-07 NOTE — Patient Instructions (Addendum)
? ?  B12 injection today ? ? ?Medications changes include :   start B12 1000 mcg daily.  Increase rybelsus to 7 mg daily ? ? ?Your prescription(s) have been sent to your pharmacy.  ? ? ? ?Return in about 3 months (around 11/07/2021) for follow upnurse B12 injection in 2 weeks. ? ?

## 2021-08-08 DIAGNOSIS — H1045 Other chronic allergic conjunctivitis: Secondary | ICD-10-CM | POA: Diagnosis not present

## 2021-08-28 ENCOUNTER — Ambulatory Visit (INDEPENDENT_AMBULATORY_CARE_PROVIDER_SITE_OTHER): Payer: Medicare Other

## 2021-08-28 ENCOUNTER — Other Ambulatory Visit: Payer: Self-pay | Admitting: Thoracic Surgery (Cardiothoracic Vascular Surgery)

## 2021-08-28 DIAGNOSIS — E538 Deficiency of other specified B group vitamins: Secondary | ICD-10-CM

## 2021-08-28 DIAGNOSIS — C3432 Malignant neoplasm of lower lobe, left bronchus or lung: Secondary | ICD-10-CM

## 2021-08-28 MED ORDER — CYANOCOBALAMIN 1000 MCG/ML IJ SOLN
1000.0000 ug | Freq: Once | INTRAMUSCULAR | Status: AC
  Administered 2021-08-28: 1000 ug via INTRAMUSCULAR

## 2021-08-28 NOTE — Progress Notes (Signed)
B12 administered R Deltoid tolerated well

## 2021-09-09 ENCOUNTER — Encounter: Payer: Self-pay | Admitting: Internal Medicine

## 2021-09-09 ENCOUNTER — Ambulatory Visit (INDEPENDENT_AMBULATORY_CARE_PROVIDER_SITE_OTHER): Payer: Medicare Other | Admitting: Internal Medicine

## 2021-09-09 DIAGNOSIS — I10 Essential (primary) hypertension: Secondary | ICD-10-CM

## 2021-09-09 DIAGNOSIS — J069 Acute upper respiratory infection, unspecified: Secondary | ICD-10-CM

## 2021-09-09 DIAGNOSIS — R931 Abnormal findings on diagnostic imaging of heart and coronary circulation: Secondary | ICD-10-CM

## 2021-09-09 MED ORDER — HYDROCODONE BIT-HOMATROP MBR 5-1.5 MG/5ML PO SOLN: 5.0000 mL | Freq: Three times a day (TID) | ORAL | 0 refills | Status: DC | PRN

## 2021-09-09 MED ORDER — AZITHROMYCIN 250 MG PO TABS: ORAL_TABLET | ORAL | 0 refills | Status: DC

## 2021-09-09 NOTE — Assessment & Plan Note (Signed)
Acute Likely bacterial  Start Zpak, hycodan cough syrup otc cold medications Rest, fluid Call if no improvement

## 2021-09-09 NOTE — Progress Notes (Signed)
Subjective:    Patient ID: Jonathon Armstrong, male    DOB: 1950/05/12, 71 y.o.   MRN: [id number]      HPI Gerrad is here for  Chief Complaint  Patient presents with   Cough    X6 days, afebrile, runny nose, scratchy throat, stuffy head and fever blister all after getting shingles vaccine.     Symptoms started over two weeks ago from his grandkids.  It did get better and then got worse again last week. He has had spasmodic coughing, his wife has heard wheezing and crackling.  Two days ago he got a fever blister.    Taking mucinex and otc cold meds w/o improvement  Medications and allergies reviewed with patient and updated if appropriate.  Current Outpatient Medications on File Prior to Visit  Medication Sig Dispense Refill   aspirin EC 81 MG tablet Take 1 tablet (81 mg total) by mouth daily. Swallow whole. 90 tablet 3   empagliflozin (JARDIANCE) 25 MG TABS tablet Take 1 tablet (25 mg total) by mouth daily before breakfast. 90 tablet 1   labetalol (NORMODYNE) 200 MG tablet Take 1 tablet (200 mg total) by mouth 2 (two) times daily. 180 tablet 3   metFORMIN (GLUCOPHAGE) 500 MG tablet TAKE TWO TABLETS BY MOUTH TWICE DAILY WITH FOOD 360 tablet 1   rosuvastatin (CRESTOR) 40 MG tablet Take 1 tablet (40 mg total) by mouth daily. 90 tablet 3   Semaglutide (RYBELSUS) 7 MG TABS Take 7 mg by mouth daily. 90 tablet 1   spironolactone (ALDACTONE) 25 MG tablet TAKE ONE TABLET ONCE DAILY 90 tablet 1   telmisartan (MICARDIS) 80 MG tablet TAKE 1 TABLET ONCE DAILY. 90 tablet 2   No current facility-administered medications on file prior to visit.    Review of Systems  Constitutional:  Positive for fatigue. Negative for chills and fever.  HENT:  Positive for congestion (discolored mucus) and sore throat (resolved). Negative for ear pain and sinus pain.        Fever blister  Respiratory:  Positive for cough, shortness of breath and wheezing.   Gastrointestinal:  Negative for diarrhea and  nausea.  Neurological:  Negative for dizziness and headaches.       Objective:   Vitals:   09/09/21 1140  BP: 112/66  Pulse: 75  Temp: 98.7 F (37.1 C)  SpO2: 95%   BP Readings from Last 3 Encounters:  09/09/21 112/66  08/07/21 116/74  06/26/21 122/76   Wt Readings from Last 3 Encounters:  09/09/21 210 lb (95.3 kg)  08/07/21 214 lb (97.1 kg)  06/26/21 212 lb 3.2 oz (96.3 kg)   Body mass index is 30.13 kg/m.    Physical Exam Constitutional:      General: He is not in acute distress.    Appearance: Normal appearance. He is not ill-appearing.  HENT:     Head: Normocephalic.     Right Ear: Tympanic membrane, ear canal and external ear normal. There is no impacted cerumen.     Left Ear: Tympanic membrane, ear canal and external ear normal. There is no impacted cerumen.     Mouth/Throat:     Mouth: Mucous membranes are moist.     Pharynx: No oropharyngeal exudate or posterior oropharyngeal erythema.  Eyes:     Conjunctiva/sclera: Conjunctivae normal.  Cardiovascular:     Rate and Rhythm: Normal rate and regular rhythm.  Pulmonary:     Effort: Pulmonary effort is normal. No respiratory distress.  Breath sounds: Normal breath sounds. No wheezing or rales.  Musculoskeletal:     Cervical back: Neck supple. No tenderness.  Lymphadenopathy:     Cervical: No cervical adenopathy.  Skin:    General: Skin is warm and dry.     Findings: No rash.  Neurological:     Mental Status: He is alert.            Assessment & Plan:    See Problem List for Assessment and Plan of chronic medical problems.

## 2021-09-09 NOTE — Patient Instructions (Addendum)
    Medications changes include :   zpak and cough syrup   Your prescription(s) have been sent to your pharmacy.      Return if symptoms worsen or fail to improve.

## 2021-09-09 NOTE — Assessment & Plan Note (Signed)
Chronic Blood pressure well controlled Continue telmisartan 80 mg daily, spironolactone 25 mg daily, labetalol 200 mg twice daily

## 2021-09-10 ENCOUNTER — Ambulatory Visit: Payer: Medicare Other | Admitting: Emergency Medicine

## 2021-09-13 ENCOUNTER — Ambulatory Visit (INDEPENDENT_AMBULATORY_CARE_PROVIDER_SITE_OTHER): Payer: Medicare Other | Admitting: Acute Care

## 2021-09-13 ENCOUNTER — Encounter: Payer: Self-pay | Admitting: Acute Care

## 2021-09-13 VITALS — BP 120/72 | HR 75 | Temp 98.3°F | Ht 70.0 in | Wt 208.8 lb

## 2021-09-13 DIAGNOSIS — R931 Abnormal findings on diagnostic imaging of heart and coronary circulation: Secondary | ICD-10-CM

## 2021-09-13 DIAGNOSIS — R051 Acute cough: Secondary | ICD-10-CM | POA: Diagnosis not present

## 2021-09-13 DIAGNOSIS — Z85118 Personal history of other malignant neoplasm of bronchus and lung: Secondary | ICD-10-CM

## 2021-09-13 MED ORDER — PREDNISONE 10 MG PO TABS: ORAL_TABLET | ORAL | 0 refills | Status: DC

## 2021-09-16 ENCOUNTER — Ambulatory Visit
Admission: RE | Admit: 2021-09-16 | Discharge: 2021-09-16 | Disposition: A | Discharge status: Home / Self Care | Payer: Medicare Other | Source: Ambulatory Visit | Attending: Acute Care | Admitting: Acute Care

## 2021-09-16 DIAGNOSIS — Z85110 Personal history of malignant carcinoid tumor of bronchus and lung: Secondary | ICD-10-CM | POA: Diagnosis not present

## 2021-09-16 DIAGNOSIS — I3139 Other pericardial effusion (noninflammatory): Secondary | ICD-10-CM | POA: Diagnosis not present

## 2021-09-16 DIAGNOSIS — R051 Acute cough: Secondary | ICD-10-CM

## 2021-09-16 DIAGNOSIS — I700 Atherosclerosis of aorta: Secondary | ICD-10-CM | POA: Diagnosis not present

## 2021-09-16 DIAGNOSIS — J90 Pleural effusion, not elsewhere classified: Secondary | ICD-10-CM | POA: Diagnosis not present

## 2021-09-18 ENCOUNTER — Telehealth: Payer: Self-pay | Admitting: Acute Care

## 2021-09-18 NOTE — Telephone Encounter (Signed)
I called the patient to share the results of his CT Chest. There was no answer. I did leave a brief message on his VM to let him know that the scan was negative for any recurrent or metastatic lung disease. I have asked him to call [phone] so I can discuss the finding of a small trace right sided pleural effusion. I will await his return call.

## 2021-10-02 ENCOUNTER — Ambulatory Visit (INDEPENDENT_AMBULATORY_CARE_PROVIDER_SITE_OTHER): Payer: Medicare Other

## 2021-10-02 DIAGNOSIS — E538 Deficiency of other specified B group vitamins: Secondary | ICD-10-CM

## 2021-10-02 MED ORDER — CYANOCOBALAMIN 1000 MCG/ML IJ SOLN
1000.0000 ug | Freq: Once | INTRAMUSCULAR | Status: AC
  Administered 2021-10-02: 1000 ug via INTRAMUSCULAR

## 2021-10-02 NOTE — Progress Notes (Signed)
After obtaining consent, and per orders of Dr. Quay Burow, injection of B12 given by Max Sane. Patient tolerated injection well in left deltoid and instructed to report any adverse reaction to me immediately.

## 2021-10-04 ENCOUNTER — Ambulatory Visit (INDEPENDENT_AMBULATORY_CARE_PROVIDER_SITE_OTHER): Payer: Medicare Other | Admitting: Acute Care

## 2021-10-04 ENCOUNTER — Encounter: Payer: Self-pay | Admitting: Acute Care

## 2021-10-04 ENCOUNTER — Ambulatory Visit (INDEPENDENT_AMBULATORY_CARE_PROVIDER_SITE_OTHER): Payer: Medicare Other | Admitting: Thoracic Surgery (Cardiothoracic Vascular Surgery)

## 2021-10-04 VITALS — BP 120/80 | HR 67 | Temp 98.1°F | Ht 70.0 in | Wt 212.0 lb

## 2021-10-04 DIAGNOSIS — R051 Acute cough: Secondary | ICD-10-CM

## 2021-10-04 DIAGNOSIS — C3432 Malignant neoplasm of lower lobe, left bronchus or lung: Secondary | ICD-10-CM

## 2021-10-04 DIAGNOSIS — R931 Abnormal findings on diagnostic imaging of heart and coronary circulation: Secondary | ICD-10-CM | POA: Diagnosis not present

## 2021-10-04 DIAGNOSIS — Z85118 Personal history of other malignant neoplasm of bronchus and lung: Secondary | ICD-10-CM | POA: Diagnosis not present

## 2021-10-04 NOTE — Patient Instructions (Addendum)
It is good to see you today. I am so glad you feel better.  Please call whenever you need to be seen. We are here when you need [place].

## 2021-10-04 NOTE — Progress Notes (Signed)
History of Present Illness Jonathon Armstrong is a 71 y.o. male never smoker with past medical history of diabetes, hypertension, hyperlipidemia.  Had a CT coronary of the heart which revealed a lower lobe pulmonary nodule. This nodule had low grade PET avidity and patient had bronchoscopy, robotic assisted left wedge resection and  left lower lobectomy 07/2019. Biopsy was positive for stage I adenocarcinoma. He is currently followed by Dr. Kipp Brood with surveillance CT's . He was seen by [name] during his lung nodule work up. Last seen in our office 08/2021 for acute cough.   10/04/2021 Pt. Presents for follow up for acute cough. He was seen 09/13/2021 for cough that had been persistent for 6 weeks after caring for grandchildren who had been sick with strep throat. Additionally he and his wife had recently had Shingles vaccines.  He  was treated with a pack, Hycodan cough medication, pred taper and omeprazole . He was encouraged to start non-sedating antihistamine, and use Delsym cough syrup.  Today, patient has had complete resolution of his cough. He feels much better, less fatigue. He feels he is back to his baseline.  I did obtain his serial surveillance CT scan of the chest  early which was negative for any signs of metastatic disease, as we were wanting to rule out any recurrent lung cancer as cause of cough. There was notation of a trace right pleural effusion that I suspect was a result of his cough. His lungs are clear on auscultation today. He does see Dr. Kipp Brood later today for his surveillance appointment.   Test Results: 09/16/2021 IMPRESSION: 1. Status post left lower lobectomy. No evidence of recurrent or metastatic disease in the chest. 2. New, trace right pleural effusion. 3. Unchanged trace pericardial effusion. 4. Coronary artery disease. 5. Cholelithiasis.     Latest Ref Rng & Units 08/05/2021    8:06 AM 04/02/2021    8:27 AM 11/13/2020    8:57 AM  CBC  WBC 4.0 -  10.5 K/uL 6.4  6.6  5.7   Hemoglobin 13.0 - 17.0 g/dL 11.3  11.5  11.5   Hematocrit 39.0 - 52.0 % 32.8  34.5  32.5   Platelets 150.0 - 400.0 K/uL 139.0  124.0  136.0        Latest Ref Rng & Units 08/05/2021    8:06 AM 04/02/2021    8:27 AM 09/27/2020    9:46 AM  BMP  Glucose 70 - 99 mg/dL 127  137  211   BUN 6 - 23 mg/dL 26  26  21    Creatinine 0.40 - 1.50 mg/dL 1.32  1.35  1.18   Sodium 135 - 145 mEq/L 140  137  138   Potassium 3.5 - 5.1 mEq/L 4.4  4.6  4.4   Chloride 96 - 112 mEq/L 108  104  104   CO2 19 - 32 mEq/L 22  23  26    Calcium 8.4 - 10.5 mg/dL 10.0  10.1  10.0     BNP No results found for: "BNP"  ProBNP No results found for: "PROBNP"  PFT    Component Value Date/Time   FEV1PRE 2.47 08/12/2019 1447   FEV1POST 2.67 08/12/2019 1447   FVCPRE 3.13 08/12/2019 1447   FVCPOST 3.31 08/12/2019 1447   TLC 5.67 08/12/2019 1447   DLCOUNC 21.81 08/12/2019 1447   PREFEV1FVCRT 79 08/12/2019 1447   PSTFEV1FVCRT 81 08/12/2019 1447    CT CHEST WO CONTRAST  Result Date: 09/16/2021 CLINICAL DATA:  Persistent cough, history of lung cancer, status post left lower lobectomy * Tracking Code: BO * EXAM: CT CHEST WITHOUT CONTRAST TECHNIQUE: Multidetector CT imaging of the chest was performed following the standard protocol without IV contrast. RADIATION DOSE REDUCTION: This exam was performed according to the departmental dose-optimization program which includes automated exposure control, adjustment of the mA and/or kV according to patient size and/or use of iterative reconstruction technique. COMPARISON:  04/12/2021 FINDINGS: Cardiovascular: Aortic atherosclerosis. Normal heart size. Three-vessel coronary artery disease. Unchanged trace pericardial effusion. Mediastinum/Nodes: No enlarged mediastinal, hilar, or axillary lymph nodes. Thyroid gland, trachea, and esophagus demonstrate no significant findings. Lungs/Pleura: Status post left lower lobectomy. New, trace right pleural effusion.  Upper Abdomen: No acute abnormality. Gallstone partially imaged in the gallbladder fundus (series 2, image 152). Simple appearing bilateral renal cysts, incompletely imaged. Musculoskeletal: No chest wall abnormality. No suspicious osseous lesions identified. IMPRESSION: 1. Status post left lower lobectomy. No evidence of recurrent or metastatic disease in the chest. 2. New, trace right pleural effusion. 3. Unchanged trace pericardial effusion. 4. Coronary artery disease. 5. Cholelithiasis. Aortic Atherosclerosis (ICD10-I70.0). Electronically Signed   By: Delanna Ahmadi M.D.   On: 09/16/2021 10:26     Past medical hx Past Medical History:  Diagnosis Date   Allergy    seasonal   Asthma 2017-06-22   Asystole ([place])    "vagal response"post nasal polypectomy   Complication of anesthesia 06/22/2004   Coded x2 after nasal polyp removal (Believes vagal nerve response)   Diabetes mellitus without complication ([place])    pre but take metformin   Diverticulosis of colon    FH: colonic polyps    hx of   Hyperlipidemia    Hypertension    Mitral regurgitation    mild & aortic Valve calcification on 2d echo;sbe prophylaxis   Parotitis, acute 2011-06-23   Seasonal rhinitis    Sleep apnea    CPAP     Social History   Tobacco Use   Smoking status: Never   Smokeless tobacco: Never  Vaping Use   Vaping Use: Never used  Substance Use Topics   Alcohol use: Yes    Alcohol/week: 2.0 standard drinks of alcohol    Types: 1 Glasses of wine, 1 Shots of liquor per week    Comment: socially < 2/ week   Drug use: No    Mr.Mckimmy reports that he has never smoked. He has never used smokeless tobacco. He reports current alcohol use of about 2.0 standard drinks of alcohol per week. He reports that he does not use drugs.  Tobacco Cessation: Never smoker    Past surgical hx, Family hx, Social hx all reviewed.  Current Outpatient Medications on File Prior to Visit  Medication Sig   aspirin EC 81 MG tablet Take 1 tablet (81  mg total) by mouth daily. Swallow whole.   empagliflozin (JARDIANCE) 25 MG TABS tablet Take 1 tablet (25 mg total) by mouth daily before breakfast.   labetalol (NORMODYNE) 200 MG tablet Take 1 tablet (200 mg total) by mouth 2 (two) times daily.   metFORMIN (GLUCOPHAGE) 500 MG tablet TAKE TWO TABLETS BY MOUTH TWICE DAILY WITH FOOD   rosuvastatin (CRESTOR) 40 MG tablet Take 1 tablet (40 mg total) by mouth daily.   Semaglutide (RYBELSUS) 7 MG TABS Take 7 mg by mouth daily.   spironolactone (ALDACTONE) 25 MG tablet TAKE ONE TABLET ONCE DAILY   telmisartan (MICARDIS) 80 MG tablet TAKE 1 TABLET ONCE DAILY.   No current facility-administered  medications on file prior to visit.     Allergies  Allergen Reactions   Diltiazem Hcl Other (See Comments) and Cough    ? symptoms   Penicillins     Rash with Amoxicillin with Mono in college    Review Of Systems:  Constitutional:   No  weight loss, night sweats,  Fevers, chills, fatigue, or  lassitude.  HEENT:   No headaches,  Difficulty swallowing,  Tooth/dental problems, or  Sore throat,                No sneezing, itching, ear ache, nasal congestion, post nasal drip,   CV:  No chest pain,  Orthopnea, PND, swelling in lower extremities, anasarca, dizziness, palpitations, syncope.   GI  No heartburn, indigestion, abdominal pain, nausea, vomiting, diarrhea, change in bowel habits, loss of appetite, bloody stools.   Resp: No shortness of breath with exertion or at rest.  No excess mucus, no productive cough,  No non-productive cough,  No coughing up of blood.  No change in color of mucus.  No wheezing.  No chest wall deformity  Skin: no rash or lesions.  GU: no dysuria, change in color of urine, no urgency or frequency.  No flank pain, no hematuria   MS:  No joint pain or swelling.  No decreased range of motion.  No back pain.  Psych:  No change in mood or affect. No depression or anxiety.  No memory loss.   Vital Signs BP 120/80 (BP Location:  Left Arm, Cuff Size: Normal)   Pulse 67   Temp 98.1 F (36.7 C) (Oral)   Ht 5\' 10"  (1.778 m)   Wt 212 lb (96.2 kg)   SpO2 98%   BMI 30.42 kg/m    Physical Exam:  General- No distress,  A&Ox3, pleasant ENT: No sinus tenderness, TM clear, pale nasal mucosa, no oral exudate,no post nasal drip, no LAN Cardiac: S1, S2, regular rate and rhythm, no murmur Chest: No wheeze/ rales/ dullness; no accessory muscle use, no nasal flaring, no sternal retractions Abd.: Soft Non-tender, ND, BS +, Body mass index is 30.42 kg/m.  Ext: No clubbing cyanosis, edema Neuro:  normal strength, MAE x 4, A&O x 3 Skin: No rashes, warm and dry, no lesions  Psych: normal mood and behavior   Assessment/Plan Acute Cough>> Resolved with treatment  Treated with Z pack, Prednisone taper, Cough suppression and PPI Plan Follow up as needed  Follow up with Dr. Kipp Brood today as is scheduled.  I spent 35 minutes dedicated to the care of this patient on the date of this encounter to include pre-visit review of records, face-to-face time with the patient discussing conditions above, post visit ordering of testing, clinical documentation with the electronic health record, making appropriate referrals as documented, and communicating necessary information to the patient's healthcare team.    Magdalen Spatz, NP 10/04/2021  10:09 AM

## 2021-10-04 NOTE — Progress Notes (Signed)
     [address]       [place] [address]             [phone]       Patient: Home Provider: Office Consent for Telemedicine visit obtained.  Today's visit was completed via a real-time telehealth (see specific modality noted below). The patient/authorized person provided oral consent at the time of the visit to engage in a telemedicine encounter with the present provider at [facility]. The patient/authorized person was informed of the potential benefits, limitations, and risks of telemedicine. The patient/authorized person expressed understanding that the laws that protect confidentiality also apply to telemedicine. The patient/authorized person acknowledged understanding that telemedicine does not provide emergency services and that he or she would need to call 911 or proceed to the nearest hospital for help if such a need arose.   Total time spent in the clinical discussion 10 minutes.  Telehealth Modality: Phone visit (audio only)  I had a telephone visit with [name].  Overall he is doing well.  He recently was treated for an upper respiratory infection that he received from his grandchildren.  He is doing much better.  He denies any shortness of breath.  I personally reviewed his CT scan from last month.  There is no evidence of any recurrent or new disease.  Given that his surgery was in May 2021 we will with his surveillance to annual scans.  I will see him back in 1 year.

## 2021-10-09 DIAGNOSIS — D1801 Hemangioma of skin and subcutaneous tissue: Secondary | ICD-10-CM | POA: Diagnosis not present

## 2021-10-09 DIAGNOSIS — L570 Actinic keratosis: Secondary | ICD-10-CM | POA: Diagnosis not present

## 2021-10-09 DIAGNOSIS — Z85828 Personal history of other malignant neoplasm of skin: Secondary | ICD-10-CM | POA: Diagnosis not present

## 2021-10-09 DIAGNOSIS — L812 Freckles: Secondary | ICD-10-CM | POA: Diagnosis not present

## 2021-10-09 DIAGNOSIS — C44719 Basal cell carcinoma of skin of left lower limb, including hip: Secondary | ICD-10-CM | POA: Diagnosis not present

## 2021-10-09 DIAGNOSIS — D485 Neoplasm of uncertain behavior of skin: Secondary | ICD-10-CM | POA: Diagnosis not present

## 2021-10-09 DIAGNOSIS — L820 Inflamed seborrheic keratosis: Secondary | ICD-10-CM | POA: Diagnosis not present

## 2021-10-09 DIAGNOSIS — L821 Other seborrheic keratosis: Secondary | ICD-10-CM | POA: Diagnosis not present

## 2021-10-11 ENCOUNTER — Other Ambulatory Visit: Payer: Medicare Other

## 2021-10-11 ENCOUNTER — Telehealth: Payer: Medicare Other | Admitting: Thoracic Surgery (Cardiothoracic Vascular Surgery)

## 2021-10-11 ENCOUNTER — Other Ambulatory Visit: Payer: Self-pay | Admitting: Internal Medicine

## 2021-10-12 ENCOUNTER — Other Ambulatory Visit: Payer: Self-pay | Admitting: Internal Medicine

## 2021-10-17 ENCOUNTER — Ambulatory Visit: Payer: Medicare Other | Admitting: Cardiology

## 2021-10-18 ENCOUNTER — Ambulatory Visit: Payer: Medicare Other | Admitting: Cardiology

## 2021-10-30 ENCOUNTER — Ambulatory Visit (INDEPENDENT_AMBULATORY_CARE_PROVIDER_SITE_OTHER): Payer: Medicare Other

## 2021-10-30 DIAGNOSIS — E538 Deficiency of other specified B group vitamins: Secondary | ICD-10-CM | POA: Diagnosis not present

## 2021-10-30 MED ORDER — CYANOCOBALAMIN 1000 MCG/ML IJ SOLN
1000.0000 ug | Freq: Once | INTRAMUSCULAR | Status: AC
  Administered 2021-10-30: 1000 ug via INTRAMUSCULAR

## 2021-10-30 NOTE — Progress Notes (Signed)
After obtaining consent, and per orders of Dr. Quay Burow, injection of B12 given by Max Sane. Patient tolerated injection well in left deltoid and instructed to report any adverse reaction to me immediately.

## 2021-11-04 NOTE — Progress Notes (Signed)
Subjective:    Patient ID: Jonathon Armstrong, male    DOB: 1950-05-22, 71 y.o.   MRN: [id number]     HPI Jonathon Armstrong is here for follow up of his chronic medical problems, including DM, htn, hld, B12 def, anemia  He is taking B12 orally and getting B12 injections  Has had a few episodes of bending over and when getting back up he feels lightheaded.   He wondered if this was heat.    He is walking but not as much now that it is so heat.    Medications and allergies reviewed with patient and updated if appropriate.  Current Outpatient Medications on File Prior to Visit  Medication Sig Dispense Refill   aspirin EC 81 MG tablet Take 1 tablet (81 mg total) by mouth daily. Swallow whole. 90 tablet 3   empagliflozin (JARDIANCE) 25 MG TABS tablet Take 1 tablet (25 mg total) by mouth daily before breakfast. 90 tablet 1   labetalol (NORMODYNE) 200 MG tablet Take 1 tablet (200 mg total) by mouth 2 (two) times daily. 180 tablet 3   metFORMIN (GLUCOPHAGE) 500 MG tablet TAKE TWO TABLETS BY MOUTH TWICE DAILY WITH FOOD 360 tablet 1   Semaglutide (RYBELSUS) 7 MG TABS TAKE 1 TABLET(7 MG) BY MOUTH EVERY DAY 30 tablet 5   spironolactone (ALDACTONE) 25 MG tablet TAKE ONE TABLET ONCE DAILY 90 tablet 1   telmisartan (MICARDIS) 80 MG tablet TAKE 1 TABLET ONCE DAILY. 90 tablet 2   rosuvastatin (CRESTOR) 40 MG tablet Take 1 tablet (40 mg total) by mouth daily. 90 tablet 3   No current facility-administered medications on file prior to visit.     Review of Systems  Constitutional:  Negative for fever.  Respiratory:  Negative for cough, shortness of breath and wheezing.   Cardiovascular:  Negative for chest pain, palpitations and leg swelling.  Neurological:  Positive for light-headedness (a few episodes). Negative for headaches.       Objective:   Vitals:   11/11/21 0804  BP: 116/80  Pulse: 80  Temp: 98.2 F (36.8 C)  SpO2: 98%   BP Readings from Last 3 Encounters:  11/11/21 116/80   10/04/21 120/80  09/13/21 120/72   Wt Readings from Last 3 Encounters:  11/11/21 208 lb (94.3 kg)  10/04/21 212 lb (96.2 kg)  09/13/21 208 lb 12.8 oz (94.7 kg)   Body mass index is 29.84 kg/m.    Physical Exam Constitutional:      General: He is not in acute distress.    Appearance: Normal appearance. He is not ill-appearing.  HENT:     Head: Normocephalic and atraumatic.  Eyes:     Conjunctiva/sclera: Conjunctivae normal.  Cardiovascular:     Rate and Rhythm: Normal rate and regular rhythm.     Heart sounds: Normal heart sounds. No murmur heard. Pulmonary:     Effort: Pulmonary effort is normal. No respiratory distress.     Breath sounds: Normal breath sounds. No wheezing or rales.  Musculoskeletal:     Right lower leg: No edema.     Left lower leg: No edema.  Skin:    General: Skin is warm and dry.     Findings: No rash.  Neurological:     Mental Status: He is alert. Mental status is at baseline.  Psychiatric:        Mood and Affect: Mood normal.        Lab Results  Component Value Date  WBC 6.9 11/05/2021   HGB 11.6 (L) 11/05/2021   HCT 33.5 (L) 11/05/2021   PLT 143.0 (L) 11/05/2021   GLUCOSE 148 (H) 11/05/2021   CHOL 88 08/05/2021   TRIG 224.0 (H) 08/05/2021   HDL 30.20 (L) 08/05/2021   LDLDIRECT 28.0 08/05/2021   LDLCALC 32 09/20/2020   ALT 15 11/05/2021   AST 15 11/05/2021   NA 139 11/05/2021   K 4.3 11/05/2021   CL 105 11/05/2021   CREATININE 1.31 11/05/2021   BUN 21 11/05/2021   CO2 23 11/05/2021   TSH 1.41 11/12/2018   PSA 2.61 11/12/2018   INR 1.0 08/15/2019   HGBA1C 7.1 (H) 11/05/2021   MICROALBUR 1.2 11/05/2021     Assessment & Plan:    See Problem List for Assessment and Plan of chronic medical problems.

## 2021-11-04 NOTE — Patient Instructions (Addendum)
     Blood work was ordered for your next visit.     Medications changes include :   None    Return in about 4 months (around 03/13/2022) for follow up.

## 2021-11-05 ENCOUNTER — Other Ambulatory Visit (INDEPENDENT_AMBULATORY_CARE_PROVIDER_SITE_OTHER): Payer: Medicare Other

## 2021-11-05 DIAGNOSIS — D649 Anemia, unspecified: Secondary | ICD-10-CM | POA: Diagnosis not present

## 2021-11-05 DIAGNOSIS — E782 Mixed hyperlipidemia: Secondary | ICD-10-CM | POA: Diagnosis not present

## 2021-11-05 DIAGNOSIS — E538 Deficiency of other specified B group vitamins: Secondary | ICD-10-CM | POA: Diagnosis not present

## 2021-11-05 DIAGNOSIS — E1165 Type 2 diabetes mellitus with hyperglycemia: Secondary | ICD-10-CM

## 2021-11-05 DIAGNOSIS — I10 Essential (primary) hypertension: Secondary | ICD-10-CM | POA: Diagnosis not present

## 2021-11-05 LAB — COMPREHENSIVE METABOLIC PANEL WITH GFR
ALT: 15 U/L (ref 0–53)
AST: 15 U/L (ref 0–37)
Albumin: 4.7 g/dL (ref 3.5–5.2)
Alkaline Phosphatase: 64 U/L (ref 39–117)
BUN: 21 mg/dL (ref 6–23)
CO2: 23 meq/L (ref 19–32)
Calcium: 10.1 mg/dL (ref 8.4–10.5)
Chloride: 105 meq/L (ref 96–112)
Creatinine, Ser: 1.31 mg/dL (ref 0.40–1.50)
GFR: 54.47 mL/min — ABNORMAL LOW
Glucose, Bld: 148 mg/dL — ABNORMAL HIGH (ref 70–99)
Potassium: 4.3 meq/L (ref 3.5–5.1)
Sodium: 139 meq/L (ref 135–145)
Total Bilirubin: 0.4 mg/dL (ref 0.2–1.2)
Total Protein: 6.9 g/dL (ref 6.0–8.3)

## 2021-11-05 LAB — CBC WITH DIFFERENTIAL/PLATELET
Basophils Absolute: 0 10*3/uL (ref 0.0–0.1)
Basophils Relative: 0.4 % (ref 0.0–3.0)
Eosinophils Absolute: 0.1 10*3/uL (ref 0.0–0.7)
Eosinophils Relative: 2.1 % (ref 0.0–5.0)
HCT: 33.5 % — ABNORMAL LOW (ref 39.0–52.0)
Hemoglobin: 11.6 g/dL — ABNORMAL LOW (ref 13.0–17.0)
Lymphocytes Relative: 25.8 % (ref 12.0–46.0)
Lymphs Abs: 1.8 10*3/uL (ref 0.7–4.0)
MCHC: 34.6 g/dL (ref 30.0–36.0)
MCV: 87.5 fl (ref 78.0–100.0)
Monocytes Absolute: 0.5 10*3/uL (ref 0.1–1.0)
Monocytes Relative: 7.8 % (ref 3.0–12.0)
Neutro Abs: 4.4 10*3/uL (ref 1.4–7.7)
Neutrophils Relative %: 63.9 % (ref 43.0–77.0)
Platelets: 143 10*3/uL — ABNORMAL LOW (ref 150.0–400.0)
RBC: 3.83 Mil/uL — ABNORMAL LOW (ref 4.22–5.81)
RDW: 16 % — ABNORMAL HIGH (ref 11.5–15.5)
WBC: 6.9 10*3/uL (ref 4.0–10.5)

## 2021-11-05 LAB — IBC PANEL
Iron: 49 ug/dL (ref 42–165)
Saturation Ratios: 10.2 % — ABNORMAL LOW (ref 20.0–50.0)
TIBC: 480.2 ug/dL — ABNORMAL HIGH (ref 250.0–450.0)
Transferrin: 343 mg/dL (ref 212.0–360.0)

## 2021-11-05 LAB — FERRITIN: Ferritin: 41.2 ng/mL (ref 22.0–322.0)

## 2021-11-05 LAB — HEMOGLOBIN A1C: Hgb A1c MFr Bld: 7.1 % — ABNORMAL HIGH (ref 4.6–6.5)

## 2021-11-05 LAB — VITAMIN B12: Vitamin B-12: 654 pg/mL (ref 211–911)

## 2021-11-05 LAB — MICROALBUMIN / CREATININE URINE RATIO
Creatinine,U: 86.6 mg/dL
Microalb Creat Ratio: 1.3 mg/g (ref 0.0–30.0)
Microalb, Ur: 1.2 mg/dL (ref 0.0–1.9)

## 2021-11-11 ENCOUNTER — Ambulatory Visit (INDEPENDENT_AMBULATORY_CARE_PROVIDER_SITE_OTHER): Payer: Medicare Other | Admitting: Internal Medicine

## 2021-11-11 ENCOUNTER — Encounter: Payer: Self-pay | Admitting: Internal Medicine

## 2021-11-11 VITALS — BP 116/80 | HR 80 | Temp 98.2°F | Ht 70.0 in | Wt 208.0 lb

## 2021-11-11 DIAGNOSIS — E1165 Type 2 diabetes mellitus with hyperglycemia: Secondary | ICD-10-CM | POA: Diagnosis not present

## 2021-11-11 DIAGNOSIS — I10 Essential (primary) hypertension: Secondary | ICD-10-CM

## 2021-11-11 DIAGNOSIS — E538 Deficiency of other specified B group vitamins: Secondary | ICD-10-CM | POA: Diagnosis not present

## 2021-11-11 DIAGNOSIS — R931 Abnormal findings on diagnostic imaging of heart and coronary circulation: Secondary | ICD-10-CM

## 2021-11-11 DIAGNOSIS — E782 Mixed hyperlipidemia: Secondary | ICD-10-CM

## 2021-11-11 DIAGNOSIS — D649 Anemia, unspecified: Secondary | ICD-10-CM | POA: Diagnosis not present

## 2021-11-11 NOTE — Assessment & Plan Note (Addendum)
Chronic Regular exercise and healthy diet encouraged Check lipid panel at next visit - LDL has been well controlled Continue Crestor 40 mg daily

## 2021-11-11 NOTE — Assessment & Plan Note (Addendum)
Chronic  Lab Results  Component Value Date   HGBA1C 7.1 (H) 11/05/2021   Sugars not ideally controlled A1c, urine microalbumin reviewed Continue Rybelsus 7 mg daily, Jardiance 25 mg daily, metformin 1000 mg twice daily Stressed regular exercise, diabetic diet, weight loss

## 2021-11-11 NOTE — Assessment & Plan Note (Signed)
Chronic Blood pressure well controlled CMP Continue labetalol 200 mg twice daily, spironolactone 25 mg daily, telmisartan 80 mg daily

## 2021-11-11 NOTE — Assessment & Plan Note (Addendum)
Chronic Continue B12 supplementation Has been doing B12 injections-last injection 8/9 Taking oral B12 as well Discussed injections versus oral B12 - will continue both for now Check B12 level at next visit again

## 2021-11-11 NOTE — Assessment & Plan Note (Addendum)
Chronic Mild iron deficiency and B12 deficiency Reviewed CBC, iron levels and B12 - will check at next visit

## 2021-12-04 ENCOUNTER — Ambulatory Visit: Payer: Medicare Other

## 2021-12-10 ENCOUNTER — Other Ambulatory Visit: Payer: Self-pay | Admitting: Internal Medicine

## 2022-01-14 ENCOUNTER — Encounter: Payer: Self-pay | Admitting: Internal Medicine

## 2022-01-16 ENCOUNTER — Ambulatory Visit (INDEPENDENT_AMBULATORY_CARE_PROVIDER_SITE_OTHER): Payer: Medicare Other

## 2022-01-16 DIAGNOSIS — E538 Deficiency of other specified B group vitamins: Secondary | ICD-10-CM

## 2022-01-16 MED ORDER — CYANOCOBALAMIN 1000 MCG/ML IJ SOLN
1000.0000 ug | Freq: Once | INTRAMUSCULAR | Status: AC
  Administered 2022-01-16: 1000 ug via INTRAMUSCULAR

## 2022-01-16 NOTE — Progress Notes (Addendum)
After obtaining consent, and per orders of Dr. Quay Burow, injection of B12  was given in the left deltoid by Marrian Salvage. Patient instructed to report any adverse reaction to me immediately.

## 2022-01-27 DIAGNOSIS — Z23 Encounter for immunization: Secondary | ICD-10-CM | POA: Diagnosis not present

## 2022-02-17 ENCOUNTER — Other Ambulatory Visit: Payer: Self-pay | Admitting: Internal Medicine

## 2022-02-18 ENCOUNTER — Ambulatory Visit (INDEPENDENT_AMBULATORY_CARE_PROVIDER_SITE_OTHER): Payer: Medicare Other | Admitting: *Deleted

## 2022-02-18 DIAGNOSIS — E538 Deficiency of other specified B group vitamins: Secondary | ICD-10-CM

## 2022-02-18 MED ORDER — CYANOCOBALAMIN 1000 MCG/ML IJ SOLN
1000.0000 ug | Freq: Once | INTRAMUSCULAR | Status: AC
  Administered 2022-02-18: 1000 ug via INTRAMUSCULAR

## 2022-02-18 NOTE — Progress Notes (Signed)
Pls cosign for B12 inj../lmb  

## 2022-02-25 ENCOUNTER — Other Ambulatory Visit (INDEPENDENT_AMBULATORY_CARE_PROVIDER_SITE_OTHER): Payer: Medicare Other

## 2022-02-25 DIAGNOSIS — I10 Essential (primary) hypertension: Secondary | ICD-10-CM

## 2022-02-25 DIAGNOSIS — D649 Anemia, unspecified: Secondary | ICD-10-CM | POA: Diagnosis not present

## 2022-02-25 DIAGNOSIS — E782 Mixed hyperlipidemia: Secondary | ICD-10-CM

## 2022-02-25 DIAGNOSIS — E538 Deficiency of other specified B group vitamins: Secondary | ICD-10-CM | POA: Diagnosis not present

## 2022-02-25 DIAGNOSIS — E1165 Type 2 diabetes mellitus with hyperglycemia: Secondary | ICD-10-CM | POA: Diagnosis not present

## 2022-02-25 LAB — MICROALBUMIN / CREATININE URINE RATIO
Creatinine,U: 104.2 mg/dL
Microalb Creat Ratio: 2.2 mg/g (ref 0.0–30.0)
Microalb, Ur: 2.3 mg/dL — ABNORMAL HIGH (ref 0.0–1.9)

## 2022-02-25 LAB — COMPREHENSIVE METABOLIC PANEL
ALT: 17 U/L (ref 0–53)
AST: 16 U/L (ref 0–37)
Albumin: 4.6 g/dL (ref 3.5–5.2)
Alkaline Phosphatase: 59 U/L (ref 39–117)
BUN: 20 mg/dL (ref 6–23)
CO2: 24 mEq/L (ref 19–32)
Calcium: 9.5 mg/dL (ref 8.4–10.5)
Chloride: 108 mEq/L (ref 96–112)
Creatinine, Ser: 1.3 mg/dL (ref 0.40–1.50)
GFR: 54.85 mL/min — ABNORMAL LOW (ref >60.00)
Glucose, Bld: 132 mg/dL — ABNORMAL HIGH (ref 70–99)
Potassium: 4.3 mEq/L (ref 3.5–5.1)
Sodium: 141 mEq/L (ref 135–145)
Total Bilirubin: 0.4 mg/dL (ref 0.2–1.2)
Total Protein: 6.8 g/dL (ref 6.0–8.3)

## 2022-02-25 LAB — CBC WITH DIFFERENTIAL/PLATELET
Basophils Absolute: 0 10*3/uL (ref 0.0–0.1)
Basophils Relative: 0.4 % (ref 0.0–3.0)
Eosinophils Absolute: 0.1 10*3/uL (ref 0.0–0.7)
Eosinophils Relative: 2.5 % (ref 0.0–5.0)
HCT: 32.2 % — ABNORMAL LOW (ref 39.0–52.0)
Hemoglobin: 11.2 g/dL — ABNORMAL LOW (ref 13.0–17.0)
Lymphocytes Relative: 24.4 % (ref 12.0–46.0)
Lymphs Abs: 1.4 10*3/uL (ref 0.7–4.0)
MCHC: 34.6 g/dL (ref 30.0–36.0)
MCV: 87.7 fl (ref 78.0–100.0)
Monocytes Absolute: 0.5 10*3/uL (ref 0.1–1.0)
Monocytes Relative: 9.4 % (ref 3.0–12.0)
Neutro Abs: 3.6 10*3/uL (ref 1.4–7.7)
Neutrophils Relative %: 63.3 % (ref 43.0–77.0)
Platelets: 134 10*3/uL — ABNORMAL LOW (ref 150.0–400.0)
RBC: 3.67 Mil/uL — ABNORMAL LOW (ref 4.22–5.81)
RDW: 14.3 % (ref 11.5–15.5)
WBC: 5.7 10*3/uL (ref 4.0–10.5)

## 2022-02-25 LAB — LDL CHOLESTEROL, DIRECT: Direct LDL: 27 mg/dL

## 2022-02-25 LAB — IBC PANEL
Iron: 51 ug/dL (ref 42–165)
Saturation Ratios: 12.3 % — ABNORMAL LOW (ref 20.0–50.0)
TIBC: 413 ug/dL (ref 250.0–450.0)
Transferrin: 295 mg/dL (ref 212.0–360.0)

## 2022-02-25 LAB — LIPID PANEL
Cholesterol: 86 mg/dL (ref 0–200)
HDL: 30.9 mg/dL — ABNORMAL LOW (ref >39.00)
NonHDL: 54.81
Total CHOL/HDL Ratio: 3
Triglycerides: 215 mg/dL — ABNORMAL HIGH (ref 0.0–149.0)
VLDL: 43 mg/dL — ABNORMAL HIGH (ref 0.0–40.0)

## 2022-02-25 LAB — HEMOGLOBIN A1C: Hgb A1c MFr Bld: 7.2 % — ABNORMAL HIGH (ref 4.6–6.5)

## 2022-02-25 LAB — VITAMIN B12: Vitamin B-12: 772 pg/mL (ref 211–911)

## 2022-02-25 LAB — FERRITIN: Ferritin: 33 ng/mL (ref 22.0–322.0)

## 2022-03-03 ENCOUNTER — Encounter: Payer: Self-pay | Admitting: Internal Medicine

## 2022-03-03 NOTE — Patient Instructions (Addendum)
     Blood work was ordered for your next visit.     Medications changes include :   vascepa 2gm twice a day for high triglycerides    A referral was ordered for hematology/oncology.     Someone will call you to schedule an appointment.     Return in about 6 months (around 09/03/2022) for follow up.

## 2022-03-03 NOTE — Progress Notes (Unsigned)
      Subjective:    Patient ID: Jonathon Armstrong, male    DOB: January 28, 1951, 71 y.o.   MRN: [id number]     HPI Jonathon Armstrong is here for follow up of his chronic medical problems, including htn, hld, B12 def, anemia, DM  Inc rybelsus  Stool cards  Medications and allergies reviewed with patient and updated if appropriate.  Current Outpatient Medications on File Prior to Visit  Medication Sig Dispense Refill   aspirin EC 81 MG tablet Take 1 tablet (81 mg total) by mouth daily. Swallow whole. 90 tablet 3   JARDIANCE 25 MG TABS tablet Take 1 tablet (25 mg total) by mouth daily before breakfast. 90 tablet 1   labetalol (NORMODYNE) 200 MG tablet Take 1 tablet (200 mg total) by mouth 2 (two) times daily. 180 tablet 3   metFORMIN (GLUCOPHAGE) 500 MG tablet TAKE TWO TABLETS BY MOUTH TWICE DAILY WITH FOOD 360 tablet 1   rosuvastatin (CRESTOR) 40 MG tablet Take 1 tablet (40 mg total) by mouth daily. 90 tablet 3   Semaglutide (RYBELSUS) 7 MG TABS TAKE 1 TABLET(7 MG) BY MOUTH EVERY DAY 30 tablet 5   spironolactone (ALDACTONE) 25 MG tablet TAKE ONE TABLET ONCE DAILY 90 tablet 1   telmisartan (MICARDIS) 80 MG tablet TAKE 1 TABLET ONCE DAILY. 90 tablet 2   No current facility-administered medications on file prior to visit.     Review of Systems     Objective:  There were no vitals filed for this visit. BP Readings from Last 3 Encounters:  11/11/21 116/80  10/04/21 120/80  09/13/21 120/72   Wt Readings from Last 3 Encounters:  11/11/21 208 lb (94.3 kg)  10/04/21 212 lb (96.2 kg)  09/13/21 208 lb 12.8 oz (94.7 kg)   There is no height or weight on file to calculate BMI.    Physical Exam     Lab Results  Component Value Date   WBC 5.7 02/25/2022   HGB 11.2 (L) 02/25/2022   HCT 32.2 (L) 02/25/2022   PLT 134.0 (L) 02/25/2022   GLUCOSE 132 (H) 02/25/2022   CHOL 86 02/25/2022   TRIG 215.0 (H) 02/25/2022   HDL 30.90 (L) 02/25/2022   LDLDIRECT 27.0 02/25/2022   LDLCALC 32  09/20/2020   ALT 17 02/25/2022   AST 16 02/25/2022   NA 141 02/25/2022   K 4.3 02/25/2022   CL 108 02/25/2022   CREATININE 1.30 02/25/2022   BUN 20 02/25/2022   CO2 24 02/25/2022   TSH 1.41 11/12/2018   PSA 2.61 11/12/2018   INR 1.0 08/15/2019   HGBA1C 7.2 (H) 02/25/2022   MICROALBUR 2.3 (H) 02/25/2022     Assessment & Plan:    See Problem List for Assessment and Plan of chronic medical problems.

## 2022-03-04 ENCOUNTER — Ambulatory Visit (INDEPENDENT_AMBULATORY_CARE_PROVIDER_SITE_OTHER): Payer: Medicare Other | Admitting: Internal Medicine

## 2022-03-04 VITALS — BP 114/78 | HR 72 | Temp 98.2°F | Ht 70.0 in | Wt 212.0 lb

## 2022-03-04 DIAGNOSIS — E538 Deficiency of other specified B group vitamins: Secondary | ICD-10-CM

## 2022-03-04 DIAGNOSIS — D649 Anemia, unspecified: Secondary | ICD-10-CM

## 2022-03-04 DIAGNOSIS — R931 Abnormal findings on diagnostic imaging of heart and coronary circulation: Secondary | ICD-10-CM

## 2022-03-04 DIAGNOSIS — E1165 Type 2 diabetes mellitus with hyperglycemia: Secondary | ICD-10-CM

## 2022-03-04 DIAGNOSIS — I10 Essential (primary) hypertension: Secondary | ICD-10-CM

## 2022-03-04 DIAGNOSIS — E782 Mixed hyperlipidemia: Secondary | ICD-10-CM

## 2022-03-04 MED ORDER — ICOSAPENT ETHYL 1 G PO CAPS: 2.0000 g | ORAL_CAPSULE | Freq: Two times a day (BID) | ORAL | 5 refills | Status: DC

## 2022-03-04 NOTE — Assessment & Plan Note (Signed)
Chronic Blood pressure well controlled CMP Continue labetalol 200 mg twice daily, spironolactone 25 mg daily, telmisartan 80 mg daily

## 2022-03-04 NOTE — Assessment & Plan Note (Signed)
Chronic Regular exercise and healthy diet encouraged LDL well-controlled Continue Crestor 40 mg daily

## 2022-03-04 NOTE — Assessment & Plan Note (Addendum)
Chronic Mild, stable Started around the time he was dx with lung cancer Also with slightly low platelets Iron levels, B12 and folate normal over the last 6 months Will refer to hematology-looks benign given mild degree, but unsure cause

## 2022-03-04 NOTE — Assessment & Plan Note (Addendum)
Chronic   Lab Results  Component Value Date   HGBA1C 7.2 (H) 02/25/2022   Sugars not ideally controlled Continue Jardiance 25 mg daily, metformin 1000 mg twice daily and Rybelsus 7 mg daily -- wants to hold of on increasing for now and work on lifestyle  Stressed regular exercise, diabetic diet

## 2022-03-04 NOTE — Assessment & Plan Note (Signed)
Chronic Most recent B12 level is normal Continue B12 supplementation

## 2022-03-06 ENCOUNTER — Telehealth: Payer: Self-pay | Admitting: Physician Assistant

## 2022-03-06 NOTE — Telephone Encounter (Signed)
Spoke with patient confirming new patient appointment 03/25/2022

## 2022-03-12 ENCOUNTER — Telehealth: Payer: Medicare Other

## 2022-03-21 ENCOUNTER — Ambulatory Visit (INDEPENDENT_AMBULATORY_CARE_PROVIDER_SITE_OTHER): Payer: Medicare Other | Admitting: *Deleted

## 2022-03-21 DIAGNOSIS — E538 Deficiency of other specified B group vitamins: Secondary | ICD-10-CM | POA: Diagnosis not present

## 2022-03-21 MED ORDER — CYANOCOBALAMIN 1000 MCG/ML IJ SOLN
1000.0000 ug | Freq: Once | INTRAMUSCULAR | Status: AC
  Administered 2022-03-21: 1000 ug via INTRAMUSCULAR

## 2022-03-21 NOTE — Progress Notes (Signed)
Pls cosign for B12 inj in absence of PCP../lmb   

## 2022-03-24 NOTE — Progress Notes (Signed)
[place] [phone]   [phone] [place] NOTE  Patient Care Team: [name], MD as PCP - General (Internal Medicine) Stanford Breed Denice Bors, MD as PCP - Cardiology (Cardiology) [place], Heather, Early as Consulting Physician (Optometry) [place], Darnelle Maffucci, [facility] (Inactive) (Pharmacist)   CHIEF COMPLAINTS/PURPOSE OF CONSULTATION:  Normocytic anemia Thrombocytopenia  HISTORY OF PRESENTING ILLNESS:  Jonathon Armstrong 72 y.o. male with medical history significant for HTN, DM, hyperlipidemia, OSA and mitral regurgitation presents to the hematology clinic for evaluation for anemia and thrombocytopenia. He is unaccompanied for this visit.   On review of the previous records, there is evidence of anemia and thrombocytopenia starting May 2021. Most recent labs from 02/25/2022 showed Hgb 11.2, MCV 87.7, Plt 134K.  On exam today, Jonathon Armstrong reports donating blood over the last 2 years a total of 3 times with most recent donation in October 2023. He has great energy levels and stays active. He denies any appetite or weight changes. He denies easy bruising or signs of active bleeding. He denies nausea, vomiting or abdominal pain. Bowel habits are regular without recurrent episodes of diarrhea or constipation. He denies fevers, chills, sweats, shortness of breath, chest pain or cough. He has no other complaints. Rest of the 10 point ROS is below.   MEDICAL HISTORY:  Past Medical History:  Diagnosis Date   Allergy    seasonal   Asthma 05/21/2017   Asystole ([place])    "vagal response"post nasal polypectomy   Complication of anesthesia 05-21-04   Coded x2 after nasal polyp removal (Believes vagal nerve response)   Diabetes mellitus without complication ([place])    pre but take metformin   Diverticulosis of colon    FH: colonic polyps    hx of   Hyperlipidemia    Hypertension    Mitral regurgitation    mild & aortic Valve calcification on 2d echo;sbe prophylaxis    Parotitis, acute 05-22-11   Seasonal rhinitis    Sleep apnea    CPAP    SURGICAL HISTORY: Past Surgical History:  Procedure Laterality Date   APPENDECTOMY     asytole post nasal polypectomy  05/21/06   colonoscopy with polypectomy  05/21/08   Dr Olevia Perches   heriorraphy     x2   [place] Left 08/16/2019   Procedure: Intercostal Nerve Block;  Surgeon: Lajuana Matte, MD;  Location: [facility];  Service: Thoracic;  Laterality: Left;   MIDDLE EAR SURGERY     TM replacement   NASAL SINUS SURGERY  21-May-1992, 8850,2774   X3   NODE DISSECTION  08/16/2019   Procedure: Node Dissection;  Surgeon: Lajuana Matte, MD;  Location: [facility];  Service: Thoracic;;   TONSILLECTOMY AND ADENOIDECTOMY     VIDEO BRONCHOSCOPY N/A 08/16/2019   Procedure: VIDEO BRONCHOSCOPY;  Surgeon: Lajuana Matte, MD;  Location: [facility];  Service: Thoracic;  Laterality: N/A;    SOCIAL HISTORY: Social History   Socioeconomic History   Marital status: Married    Spouse name: Not on file   Number of children: 2   Years of education: 18   Highest education level: Not on file  Occupational History   Occupation: Retired    Fish farm manager: RETIRED  Tobacco Use   Smoking status: Never   Smokeless tobacco: Never  Vaping Use   Vaping Use: Never used  Substance and Sexual Activity   Alcohol use: Yes    Alcohol/week: 2.0  standard drinks of alcohol    Types: 1 Glasses of wine, 1 Shots of liquor per week    Comment: socially < 2/ week   Drug use: No   Sexual activity: Yes  Other Topics Concern   Not on file  Social History Narrative   Fun: Water, swimming, [employer]; Psychologist, occupational work    Denies abuse and feels safe at home.   Regular exercise      Social Determinants of Health   Financial Resource Strain: Low Risk  (05/29/2021)   Overall Financial Resource Strain (CARDIA)    Difficulty of Paying Living Expenses: Not hard at all  Food Insecurity: No Food Insecurity (05/29/2021)   Hunger Vital Sign     Worried About Running Out of Food in the Last Year: Never true    Ran Out of Food in the Last Year: Never true  Transportation Needs: No Transportation Needs (05/29/2021)   PRAPARE - Hydrologist (Medical): No    Lack of Transportation (Non-Medical): No  Physical Activity: Sufficiently Active (05/29/2021)   Exercise Vital Sign    Days of Exercise per Week: 5 days    Minutes of Exercise per Session: 30 min  Stress: No Stress Concern Present (05/29/2021)   [place]    Feeling of Stress : Not at all  Social Connections: [place] (05/29/2021)   Social Connection and Isolation Panel [NHANES]    Frequency of Communication with Friends and Family: More than three times a week    Frequency of Social Gatherings with Friends and Family: More than three times a week    Attends Religious Services: More than 4 times per year    Active Member of [employer] or Organizations: Yes    Attends Music therapist: More than 4 times per year    Marital Status: Married  Human resources officer Violence: Not At Risk (05/29/2021)   Humiliation, Afraid, Rape, and Kick questionnaire    Fear of Current or Ex-Partner: No    Emotionally Abused: No    Physically Abused: No    Sexually Abused: No    FAMILY HISTORY: Family History  Problem Relation Age of Onset   Cancer Mother        liver &multiple myeloma   Heart disease Mother        CABG, Angina   Hyperlipidemia Mother    Hypertension Father    Heart disease Father        MI in 47"s   Dementia Father    COPD Sister    Other Sister        perforated bowel   Other Sister        cns aneurysm   Cancer Maternal Grandfather        bone   Diabetes Neg Hx    Stroke Neg Hx    Colon cancer Neg Hx     ALLERGIES:  is allergic to diltiazem hcl and penicillins.  MEDICATIONS:  Current Outpatient Medications  Medication Sig Dispense Refill   aspirin EC  81 MG tablet Take 1 tablet (81 mg total) by mouth daily. Swallow whole. 90 tablet 3   Cholecalciferol (VITAMIN D-3 PO) Take by mouth daily.     Cyanocobalamin (VITAMIN B-12 IJ) Inject 1,000 mcg as directed every 30 (thirty) days.     cyanocobalamin (VITAMIN B12) 1000 MCG tablet Take 1,000 mcg by mouth daily.     icosapent Ethyl (VASCEPA) 1 g  capsule Take 2 capsules (2 g total) by mouth 2 (two) times daily. (Patient taking differently: Take 2 g by mouth 2 (two) times daily. Fish Oil) 120 capsule 5   JARDIANCE 25 MG TABS tablet Take 1 tablet (25 mg total) by mouth daily before breakfast. 90 tablet 1   labetalol (NORMODYNE) 200 MG tablet Take 1 tablet (200 mg total) by mouth 2 (two) times daily. 180 tablet 3   metFORMIN (GLUCOPHAGE) 500 MG tablet TAKE TWO TABLETS BY MOUTH TWICE DAILY WITH FOOD 360 tablet 1   Semaglutide (RYBELSUS) 7 MG TABS TAKE 1 TABLET(7 MG) BY MOUTH EVERY DAY 30 tablet 5   spironolactone (ALDACTONE) 25 MG tablet TAKE ONE TABLET ONCE DAILY 90 tablet 1   telmisartan (MICARDIS) 80 MG tablet TAKE 1 TABLET ONCE DAILY. 90 tablet 2   rosuvastatin (CRESTOR) 40 MG tablet Take 1 tablet (40 mg total) by mouth daily. 90 tablet 3   No current facility-administered medications for this visit.    REVIEW OF SYSTEMS:   Constitutional: ( - ) fevers, ( - )  chills , ( - ) night sweats Eyes: ( - ) blurriness of vision, ( - ) double vision, ( - ) watery eyes Ears, nose, mouth, throat, and face: ( - ) mucositis, ( - ) sore throat Respiratory: ( - ) cough, ( - ) dyspnea, ( - ) wheezes Cardiovascular: ( - ) palpitation, ( - ) chest discomfort, ( - ) lower extremity swelling Gastrointestinal:  ( - ) nausea, ( - ) heartburn, ( - ) change in bowel habits Skin: ( - ) abnormal skin rashes Lymphatics: ( - ) new lymphadenopathy, ( - ) easy bruising Neurological: ( - ) numbness, ( - ) tingling, ( - ) new weaknesses Behavioral/Psych: ( - ) mood change, ( - ) new changes  All other systems were reviewed  with the patient and are negative.  PHYSICAL EXAMINATION: ECOG PERFORMANCE STATUS: 0 - Asymptomatic  Vitals:   03/25/22 1111  BP: 126/75  Pulse: 73  Resp: 15  Temp: (!) 97 F (36.1 C)  SpO2: 97%   Filed Weights   03/25/22 1111  Weight: 217 lb 11.2 oz (98.7 kg)    GENERAL: well appearing male in NAD  SKIN: skin color, texture, turgor are normal, no rashes or significant lesions EYES: conjunctiva are pink and non-injected, sclera clear OROPHARYNX: no exudate, no erythema; lips, buccal mucosa, and tongue normal  NECK: supple, non-tender LYMPH:  no palpable lymphadenopathy in the cervical or supraclavicular lymph nodes.  LUNGS: clear to auscultation and percussion with normal breathing effort HEART: regular rate & rhythm and no murmurs and no lower extremity edema Musculoskeletal: no cyanosis of digits and no clubbing  PSYCH: alert & oriented x 3, fluent speech NEURO: no focal motor/sensory deficits  LABORATORY DATA:  I have reviewed the data as listed    Latest Ref Rng & Units 03/25/2022   12:43 PM 02/25/2022    7:56 AM 11/05/2021    7:55 AM  CBC  WBC 4.0 - 10.5 K/uL 5.3  5.7  6.9   Hemoglobin 13.0 - 17.0 g/dL 11.1  11.2  11.6   Hematocrit 39.0 - 52.0 % 32.9  32.2  33.5   Platelets 150 - 400 K/uL 146  134.0  143.0        Latest Ref Rng & Units 03/25/2022   12:43 PM 02/25/2022    7:56 AM 11/05/2021    7:55 AM  CMP  Glucose 70 - 99  mg/dL 119  132  148   BUN 8 - 23 mg/dL _0 Creatinine 0.61 - 1.24 mg/dL 1.15  1.30  1.31   Sodium 135 - 145 mmol/L 140  141  139   Potassium 3.5 - 5.1 mmol/L 4.2  4.3  4.3   Chloride 98 - 111 mmol/L 106  108  105   CO2 22 - 32 mmol/L _1 Calcium 8.9 - 10.3 mg/dL 10.5  9.5  10.1   Total Protein 6.5 - 8.1 g/dL 7.1  6.8  6.9   Total Bilirubin 0.3 - 1.2 mg/dL 0.4  0.4  0.4   Alkaline Phos 38 - 126 U/L 59  59  64   AST 15 - 41 U/L _2 ALT 0 - 44 U/L _3 ASSESSMENT & PLAN Jonathon Armstrong is a 72 y.o.  male who presents to the hematology clinic for evaluation of normocytic anemia and thrombocytopenia. We reviewed possible etiologies including liver disease/splenomegaly, infectious processes, nutritional anemias, immune mediated and bone marrow disorders. Patient is not taking any medications that can cause cytopenias. He did periodically donate blood over the last two years.  Patient will proceed with laboratory evaluation today and then consider additional workup which includes abdominal US and bone marrow biopsy.   #Normocytic anemia/Thrombocytopenia --Labs today to check CBC w/diff, CMP, iron panel, B12 level, folate level, Hep B and C serologies, and immature platelet fraction.  --Evaluate for liver disease and/or splenomegaly with abdominal US if serologic workup is negative --No need for bone marrow biopsy at this time.  --Recommend to hold additional blood donation until workup is complete --RTC in 3 months unless above workup requires further intervention   Orders Placed This Encounter  Procedures   CBC with Differential ([place] Only)    Standing Status:   Future    Number of Occurrences:   1    Standing Expiration Date:   03/25/2023   CMP ([place] only)    Standing Status:   Future    Number of Occurrences:   1    Standing Expiration Date:   03/25/2023   Ferritin    Standing Status:   Future    Number of Occurrences:   1    Standing Expiration Date:   03/25/2023   Iron and Iron Binding Capacity ([facility] only)    Standing Status:   Future    Number of Occurrences:   1    Standing Expiration Date:   03/25/2023   Retic Panel    Standing Status:   Future    Number of Occurrences:   1    Standing Expiration Date:   03/25/2023   Vitamin B12    Standing Status:   Future    Number of Occurrences:   1    Standing Expiration Date:   03/25/2023   Methylmalonic acid, serum    Standing Status:   Future    Number of Occurrences:   1    Standing Expiration Date:   03/25/2023    Hepatitis B core antibody, total    Standing Status:   Future    Number of Occurrences:   1    Standing Expiration Date:   03/25/2023   Hepatitis B surface antibody    Standing Status:   Future    Number of Occurrences:   1    Standing  Expiration Date:   03/25/2023   Hepatitis B surface antigen    Standing Status:   Future    Number of Occurrences:   1    Standing Expiration Date:   03/25/2023   Hepatitis C antibody    Standing Status:   Future    Number of Occurrences:   1    Standing Expiration Date:   03/25/2023   Immature Platelet Fraction    Standing Status:   Future    Number of Occurrences:   1    Standing Expiration Date:   03/26/2023   Folate, Serum    Standing Status:   Future    Number of Occurrences:   1    Standing Expiration Date:   03/25/2023    All questions were answered. The patient knows to call the clinic with any problems, questions or concerns.  I have spent a total of 60 minutes minutes of face-to-face and non-face-to-face time, preparing to see the patient, obtaining and/or reviewing separately obtained history, performing a medically appropriate examination, counseling and educating the patient, ordering tests/procedures,  documenting clinical information in the electronic health record, and care coordination.   Dede Query, PA-C Department of Hematology/Oncology [place] at [facility] Phone: [phone]  Patient was seen with Dr. Lorenso Courier  I have read the above note and personally examined the patient. I agree with the assessment and plan as noted above.  Briefly Jonathon Armstrong is a 72 year old male who presents for evaluation of anemia and thrombocytopenia.  On review of his labs he was noted to have a hemoglobin of 11.2 and platelets of 134 on 02/25/2022.  Due to concern for this chronic thrombocytopenia and anemia he was referred to our clinic for further evaluation and management.  Today we will conduct a full workup to include  nutritional panel with vitamin B12, folate, and iron.  Additionally we will assess for hepatitis C, hep b, and immature platelet fraction.  His blood counts are relatively mild, that we would need to consider bone marrow biopsy if he has persistent or worsening cytopenias.  The patient voiced understanding of our plan moving forward.   Ledell Peoples, MD Department of Hematology/Oncology [place] at [facility] Phone: [phone] Pager: [phone] Email: [email or web address]

## 2022-03-25 ENCOUNTER — Inpatient Hospital Stay: Payer: Medicare Other | Attending: Physician Assistant | Admitting: Physician Assistant

## 2022-03-25 ENCOUNTER — Encounter: Payer: Self-pay | Admitting: Physician Assistant

## 2022-03-25 ENCOUNTER — Inpatient Hospital Stay: Payer: Medicare Other

## 2022-03-25 VITALS — BP 126/75 | HR 73 | Temp 97.0°F | Resp 15 | Wt 217.7 lb

## 2022-03-25 DIAGNOSIS — Z808 Family history of malignant neoplasm of other organs or systems: Secondary | ICD-10-CM | POA: Diagnosis not present

## 2022-03-25 DIAGNOSIS — D696 Thrombocytopenia, unspecified: Secondary | ICD-10-CM

## 2022-03-25 DIAGNOSIS — D649 Anemia, unspecified: Secondary | ICD-10-CM

## 2022-03-25 DIAGNOSIS — I10 Essential (primary) hypertension: Secondary | ICD-10-CM | POA: Diagnosis not present

## 2022-03-25 DIAGNOSIS — E1159 Type 2 diabetes mellitus with other circulatory complications: Secondary | ICD-10-CM | POA: Diagnosis not present

## 2022-03-25 DIAGNOSIS — Z807 Family history of other malignant neoplasms of lymphoid, hematopoietic and related tissues: Secondary | ICD-10-CM

## 2022-03-25 DIAGNOSIS — Z79899 Other long term (current) drug therapy: Secondary | ICD-10-CM | POA: Diagnosis not present

## 2022-03-25 LAB — CMP (CANCER CENTER ONLY)
ALT: 21 U/L (ref 0–44)
AST: 18 U/L (ref 15–41)
Albumin: 4.7 g/dL (ref 3.5–5.0)
Alkaline Phosphatase: 59 U/L (ref 38–126)
Anion gap: 9 (ref 5–15)
BUN: 18 mg/dL (ref 8–23)
CO2: 25 mmol/L (ref 22–32)
Calcium: 10.5 mg/dL — ABNORMAL HIGH (ref 8.9–10.3)
Chloride: 106 mmol/L (ref 98–111)
Creatinine: 1.15 mg/dL (ref 0.61–1.24)
GFR, Estimated: >60 mL/min (ref >60)
Glucose, Bld: 119 mg/dL — ABNORMAL HIGH (ref 70–99)
Potassium: 4.2 mmol/L (ref 3.5–5.1)
Sodium: 140 mmol/L (ref 135–145)
Total Bilirubin: 0.4 mg/dL (ref 0.3–1.2)
Total Protein: 7.1 g/dL (ref 6.5–8.1)

## 2022-03-25 LAB — HEPATITIS C ANTIBODY: HCV Ab: NONREACTIVE

## 2022-03-25 LAB — CBC WITH DIFFERENTIAL (CANCER CENTER ONLY)
Abs Immature Granulocytes: 0.01 10*3/uL (ref 0.00–0.07)
Basophils Absolute: 0 10*3/uL (ref 0.0–0.1)
Basophils Relative: 0 %
Eosinophils Absolute: 0.1 10*3/uL (ref 0.0–0.5)
Eosinophils Relative: 2 %
HCT: 32.9 % — ABNORMAL LOW (ref 39.0–52.0)
Hemoglobin: 11.1 g/dL — ABNORMAL LOW (ref 13.0–17.0)
Immature Granulocytes: 0 %
Lymphocytes Relative: 28 %
Lymphs Abs: 1.5 10*3/uL (ref 0.7–4.0)
MCH: 29.9 pg (ref 26.0–34.0)
MCHC: 33.7 g/dL (ref 30.0–36.0)
MCV: 88.7 fL (ref 80.0–100.0)
Monocytes Absolute: 0.4 10*3/uL (ref 0.1–1.0)
Monocytes Relative: 8 %
Neutro Abs: 3.3 10*3/uL (ref 1.7–7.7)
Neutrophils Relative %: 62 %
Platelet Count: 146 10*3/uL — ABNORMAL LOW (ref 150–400)
RBC: 3.71 MIL/uL — ABNORMAL LOW (ref 4.22–5.81)
RDW: 14 % (ref 11.5–15.5)
WBC Count: 5.3 10*3/uL (ref 4.0–10.5)
nRBC: 0 % (ref 0.0–0.2)

## 2022-03-25 LAB — IRON AND IRON BINDING CAPACITY (CC-WL,HP ONLY)
Iron: 84 ug/dL (ref 45–182)
Saturation Ratios: 20 % (ref 17.9–39.5)
TIBC: 431 ug/dL (ref 250–450)
UIBC: 347 ug/dL (ref 117–376)

## 2022-03-25 LAB — RETIC PANEL
Immature Retic Fract: 18.8 % — ABNORMAL HIGH (ref 2.3–15.9)
RBC.: 3.65 MIL/uL — ABNORMAL LOW (ref 4.22–5.81)
Retic Count, Absolute: 54.4 10*3/uL (ref 19.0–186.0)
Retic Ct Pct: 1.5 % (ref 0.4–3.1)
Reticulocyte Hemoglobin: 32.8 pg (ref >27.9)

## 2022-03-25 LAB — HEPATITIS B CORE ANTIBODY, TOTAL: Hep B Core Total Ab: NONREACTIVE

## 2022-03-25 LAB — VITAMIN B12: Vitamin B-12: 921 pg/mL — ABNORMAL HIGH (ref 180–914)

## 2022-03-25 LAB — FOLATE: Folate: 12.1 ng/mL (ref >5.9)

## 2022-03-25 LAB — FERRITIN: Ferritin: 29 ng/mL (ref 24–336)

## 2022-03-25 LAB — HEPATITIS B SURFACE ANTIGEN: Hepatitis B Surface Ag: NONREACTIVE

## 2022-03-25 LAB — HEPATITIS B SURFACE ANTIBODY,QUALITATIVE: Hep B S Ab: NONREACTIVE

## 2022-03-25 LAB — IMMATURE PLATELET FRACTION: Immature Platelet Fraction: 11.9 % — ABNORMAL HIGH (ref 1.2–8.6)

## 2022-03-26 ENCOUNTER — Telehealth: Payer: Self-pay | Admitting: Physician Assistant

## 2022-03-26 NOTE — Telephone Encounter (Signed)
Called patient to schedule f/u. Left voicemail with newappointment information.

## 2022-03-27 LAB — METHYLMALONIC ACID, SERUM: Methylmalonic Acid, Quantitative: 128 nmol/L (ref 0–378)

## 2022-03-31 ENCOUNTER — Other Ambulatory Visit: Payer: Self-pay | Admitting: Physician Assistant

## 2022-03-31 ENCOUNTER — Telehealth: Payer: Self-pay | Admitting: Physician Assistant

## 2022-03-31 MED ORDER — FERROUS SULFATE 325 (65 FE) MG PO TBEC: 325.0000 mg | DELAYED_RELEASE_TABLET | Freq: Every day | ORAL | 3 refills | Status: DC

## 2022-03-31 NOTE — Telephone Encounter (Signed)
I called [name] to review the lab results from 03/25/2022. Findings show improving thrombocytopenia with platelet cont of 146K. Immature platelet fraction was elevated which could indicate an immune mediated process. Anemia is stable at 11.1 and his iron levels are borderline low with ferritin of 29. Recommend to start ferrous sulfate 325 mg once daily with a source of vitamin C. We will repeat labs in 3 months and see if there is improvement of iron and hemoglobin level. If anemia is unchanged, we will proceed with additional diagnostic workup including ordering abdominal US to evaluate for liver disease and/or splenomegaly. Patient expressed understanding of the plan provided.

## 2022-04-07 ENCOUNTER — Telehealth: Payer: Self-pay

## 2022-04-07 ENCOUNTER — Other Ambulatory Visit: Payer: Self-pay | Admitting: Physician Assistant

## 2022-04-07 DIAGNOSIS — D649 Anemia, unspecified: Secondary | ICD-10-CM

## 2022-04-07 NOTE — Telephone Encounter (Signed)
-----  Message from Briant Cedar, PA-C sent at 04/07/2022  3:14 PM EST ----- [name]:  Can you call patient and notify him that we will make referral to GI to evaluate underlying cause of iron deficiency.   Thanks, Karena Addison   ----- Message ----- From: Jaci Standard, MD Sent: 04/01/2022   7:21 PM EST To: Briant Cedar, PA-C  His blood pattern of elevated immature platelets are iron deficiency can be consistent with active bleeding. I know he had a colonoscopy in 2016, I think it may be worthwhile to consider repeat endoscopic evaluation.

## 2022-04-07 NOTE — Telephone Encounter (Signed)
Pt advised of lab results and recommendations.  GI referral made to [facility] per pt request.

## 2022-04-11 ENCOUNTER — Encounter: Payer: Self-pay | Admitting: Nurse Practitioner

## 2022-04-14 ENCOUNTER — Other Ambulatory Visit: Payer: Self-pay | Admitting: Internal Medicine

## 2022-04-14 ENCOUNTER — Other Ambulatory Visit: Payer: Self-pay | Admitting: Cardiology

## 2022-04-14 DIAGNOSIS — I10 Essential (primary) hypertension: Secondary | ICD-10-CM

## 2022-04-23 ENCOUNTER — Ambulatory Visit (INDEPENDENT_AMBULATORY_CARE_PROVIDER_SITE_OTHER): Payer: Medicare Other | Admitting: *Deleted

## 2022-04-23 DIAGNOSIS — E538 Deficiency of other specified B group vitamins: Secondary | ICD-10-CM | POA: Diagnosis not present

## 2022-04-23 MED ORDER — CYANOCOBALAMIN 1000 MCG/ML IJ SOLN
1000.0000 ug | Freq: Once | INTRAMUSCULAR | Status: AC
  Administered 2022-04-23: 1000 ug via INTRAMUSCULAR

## 2022-04-23 NOTE — Progress Notes (Signed)
Pls cosign for B12 inj../lmb  

## 2022-04-24 DIAGNOSIS — L905 Scar conditions and fibrosis of skin: Secondary | ICD-10-CM | POA: Diagnosis not present

## 2022-04-24 DIAGNOSIS — Z85828 Personal history of other malignant neoplasm of skin: Secondary | ICD-10-CM | POA: Diagnosis not present

## 2022-04-25 DIAGNOSIS — Z23 Encounter for immunization: Secondary | ICD-10-CM | POA: Diagnosis not present

## 2022-05-01 ENCOUNTER — Ambulatory Visit (INDEPENDENT_AMBULATORY_CARE_PROVIDER_SITE_OTHER): Payer: Medicare Other | Admitting: Nurse Practitioner

## 2022-05-01 ENCOUNTER — Encounter: Payer: Self-pay | Admitting: Nurse Practitioner

## 2022-05-01 VITALS — BP 124/64 | HR 74 | Ht 70.0 in | Wt 210.0 lb

## 2022-05-01 DIAGNOSIS — D649 Anemia, unspecified: Secondary | ICD-10-CM

## 2022-05-01 NOTE — Patient Instructions (Signed)
You have been scheduled for an endoscopy and colonoscopy. Please follow the written instructions given to you at your visit today. Please pick up your prep supplies at the pharmacy within the next 1-3 days. If you use inhalers (even only as needed), please bring them with you on the day of your procedure.  _______________________________________________________  If your blood pressure at your visit was 140/90 or greater, please contact your primary care physician to follow up on this.  _______________________________________________________  If you are age 73 or older, your body mass index should be between 23-30. Your Body mass index is 30.13 kg/m. If this is out of the aforementioned range listed, please consider follow up with your Primary Care Provider.  If you are age 86 or younger, your body mass index should be between 19-25. Your Body mass index is 30.13 kg/m. If this is out of the aformentioned range listed, please consider follow up with your Primary Care Provider.   ________________________________________________________  The [facility] providers would like to encourage you to use [facility] to communicate with providers for non-urgent requests or questions.  Due to long hold times on the telephone, sending your provider a message by [facility] may be a faster and more efficient way to get a response.  Please allow 48 business hours for a response.  Please remember that this is for non-urgent requests.  _______________________________________________________

## 2022-05-01 NOTE — Progress Notes (Signed)
Assessment    # 73 yo male with chronic [employer] anemia, ? Iron deficient with a ferritin of 29. Hgb is 11 which is at baseline. Occasional scant rectal bleeding  ( probable hemorrhoidal) when constipated or if has diarrhea but otherwise no overt GI blood. Recently started on oral iron. Etiology of chronic anemia not yet clear but some considerations include bone marrow problem, occult GI blood loss, or celiac disease. Gastrointestinal malignancy seems unlikely but will need to exclude.   # Chronic thrombocytopenia. Platelets stable at 146.   # DM2 on Jardiance, Rybelsus and metformin. A1c 7.2 in Dec 2023  Plan:    Hematology is arranging for an [place] to evaluate for liver disease / splenomegaly.  They are holding off on bone marrow biopsy for now. .  Continue oral iron but will hold for 5 days prior to procedures.  Will schedule for EGD / colonoscopy for evaluation for anemia. The risks and benefits of EGD and colonoscopy with possible polypectomy / biopsies were discussed and the patient agrees to proceed.   HPI:    Chief Complaint: anemia  Jonathon Armstrong is a 72 y.o. year old male with a past medical history of HLD, HTN, DM, colon polyps, cholelithiasis,   left lung cancer s/p resection, and B12 deficiency. See PMH / [place] for additional history. He used to be followed by Dr. Olevia Perches.   Jonathon Armstrong is here for evaluation of anemia. His anemia dates back to 2021. He also has chronic thrombocytopenia. He has occasional scant rectal bleeding if constipated or has diarrhea but otherwise no GI blood loss. He doesn't donate blood but did donate platelets x 1 last year. Recently had Hematology evaluation and an [place] was ordered to look for liver disease / splenomegaly.    Last colonoscopy was in 2016 and no polyps were found. What was thought to be a polyp was colonic mucosa.   No GI complaints.   No [place] of colon cancer. Mother had multiple myeloma.   Jonathon Armstrong mentions that in 2009 he arrested after  surgical procedure but has had anesthesia since without incident.   Previous Labs / Imaging::    Latest Ref Rng & Units 03/25/2022   12:43 PM 02/25/2022    7:56 AM 11/05/2021    7:55 AM  CBC  WBC 4.0 - 10.5 K/uL 5.3  5.7  6.9   Hemoglobin 13.0 - 17.0 g/dL 11.1  11.2  11.6   Hematocrit 39.0 - 52.0 % 32.9  32.2  33.5   Platelets 150 - 400 K/uL 146  134.0  143.0     No results found for: "LIPASE"    Latest Ref Rng & Units 03/25/2022   12:43 PM 02/25/2022    7:56 AM 11/05/2021    7:55 AM  CMP  Glucose 70 - 99 mg/dL 119  132  148   BUN 8 - 23 mg/dL 18  20  21    Creatinine 0.61 - 1.24 mg/dL 1.15  1.30  1.31   Sodium 135 - 145 mmol/L 140  141  139   Potassium 3.5 - 5.1 mmol/L 4.2  4.3  4.3   Chloride 98 - 111 mmol/L 106  108  105   CO2 22 - 32 mmol/L 25  24  23    Calcium 8.9 - 10.3 mg/dL 10.5  9.5  10.1   Total Protein 6.5 - 8.1 g/dL 7.1  6.8  6.9   Total Bilirubin 0.3 - 1.2 mg/dL 0.4  0.4  0.4  Alkaline Phos 38 - 126 U/L 59  59  64   AST 15 - 41 U/L 18  16  15    ALT 0 - 44 U/L 21  17  15      Previous GI Evaluation  Most recent colonoscopy  Colonoscopy July 2016 -4 mm descending colon polyp  Path - colonic mucosa  Imaging:  CT CHEST WO CONTRAST CLINICAL DATA:  Persistent cough, history of lung cancer, status post left lower lobectomy * Tracking Code: BO *  EXAM: CT CHEST WITHOUT CONTRAST  TECHNIQUE: Multidetector CT imaging of the chest was performed following the standard protocol without IV contrast.  RADIATION DOSE REDUCTION: This exam was performed according to the departmental dose-optimization program which includes automated exposure control, adjustment of the mA and/or kV according to patient size and/or use of iterative reconstruction technique.  COMPARISON:  04/12/2021  FINDINGS: Cardiovascular: Aortic atherosclerosis. Normal heart size. Three-vessel coronary artery disease. Unchanged trace pericardial effusion.  Mediastinum/Nodes: No enlarged  mediastinal, hilar, or axillary lymph nodes. Thyroid gland, trachea, and esophagus demonstrate no significant findings.  Lungs/Pleura: Status post left lower lobectomy. New, trace right pleural effusion.  Upper Abdomen: No acute abnormality. Gallstone partially imaged in the gallbladder fundus (series 2, image 152). Simple appearing bilateral renal cysts, incompletely imaged.  Musculoskeletal: No chest wall abnormality. No suspicious osseous lesions identified.  IMPRESSION: 1. Status post left lower lobectomy. No evidence of recurrent or metastatic disease in the chest. 2. New, trace right pleural effusion. 3. Unchanged trace pericardial effusion. 4. Coronary artery disease. 5. Cholelithiasis.  Aortic Atherosclerosis (ICD10-I70.0).  Electronically Signed   By: Delanna Ahmadi M.D.   On: 09/16/2021 10:26    Past Medical History:  Diagnosis Date   Allergy    seasonal   Asthma 06/24/17   Asystole ([place])    "vagal response"post nasal polypectomy   Complication of anesthesia 06/24/2004   Coded x2 after nasal polyp removal (Believes vagal nerve response)   Diabetes mellitus without complication ([place])    pre but take metformin   Diverticulosis of colon    FH: colonic polyps    hx of   Hyperlipidemia    Hypertension    Mitral regurgitation    mild & aortic Valve calcification on 2d echo;sbe prophylaxis   Parotitis, acute 06/25/11   Seasonal rhinitis    Sleep apnea    CPAP   Past Surgical History:  Procedure Laterality Date   APPENDECTOMY     asytole post nasal polypectomy  2006/06/25   colonoscopy with polypectomy  2008/06/24   Dr Olevia Perches   heriorraphy     x2   [place] Left 08/16/2019   Procedure: Intercostal Nerve Block;  Surgeon: Lajuana Matte, MD;  Location: [facility];  Service: Thoracic;  Laterality: Left;   MIDDLE EAR SURGERY     TM replacement   NASAL SINUS [place], [id number]   X3   NODE DISSECTION  08/16/2019   Procedure: Node Dissection;   Surgeon: Lajuana Matte, MD;  Location: [facility];  Service: Thoracic;;   TONSILLECTOMY AND ADENOIDECTOMY     VIDEO BRONCHOSCOPY N/A 08/16/2019   Procedure: VIDEO BRONCHOSCOPY;  Surgeon: Lajuana Matte, MD;  Location: [facility];  Service: Thoracic;  Laterality: N/A;   Family History  Problem Relation Age of Onset   Cancer Mother        liver &multiple myeloma   Heart disease Mother        CABG,  Angina   Hyperlipidemia Mother    Hypertension Father    Heart disease Father        MI in 44"s   Dementia Father    COPD Sister    Other Sister        perforated bowel   Other Sister        cns aneurysm   Cancer Maternal Grandfather        bone   Diabetes Neg Hx    Stroke Neg Hx    Colon cancer Neg Hx    Social History   Tobacco Use   Smoking status: Never   Smokeless tobacco: Never  Vaping Use   Vaping Use: Never used  Substance Use Topics   Alcohol use: Yes    Alcohol/week: 2.0 standard drinks of alcohol    Types: 1 Glasses of wine, 1 Shots of liquor per week    Comment: socially < 2/ week   Drug use: No   Current Outpatient Medications  Medication Sig Dispense Refill   aspirin EC 81 MG tablet Take 1 tablet (81 mg total) by mouth daily. Swallow whole. 90 tablet 3   Cholecalciferol (VITAMIN D-3 PO) Take by mouth daily.     Cyanocobalamin (VITAMIN B-12 IJ) Inject 1,000 mcg as directed every 30 (thirty) days.     cyanocobalamin (VITAMIN B12) 1000 MCG tablet Take 1,000 mcg by mouth daily.     ferrous sulfate 325 (65 FE) MG EC tablet Take 1 tablet (325 mg total) by mouth daily with breakfast. 30 tablet 3   icosapent Ethyl (VASCEPA) 1 g capsule Take 2 capsules (2 g total) by mouth 2 (two) times daily. (Patient taking differently: Take 2 g by mouth 2 (two) times daily. Fish Oil) 120 capsule 5   JARDIANCE 25 MG TABS tablet Take 1 tablet (25 mg total) by mouth daily before breakfast. 90 tablet 1   labetalol (NORMODYNE) 200 MG tablet Take 1 tablet (200 mg total) by mouth 2 (two)  times daily. 180 tablet 3   metFORMIN (GLUCOPHAGE) 500 MG tablet TAKE TWO TABLETS BY MOUTH TWICE DAILY WITH FOOD 360 tablet 1   RYBELSUS 7 MG TABS TAKE 1 TABLET(7 MG) BY MOUTH EVERY DAY 30 tablet 5   spironolactone (ALDACTONE) 25 MG tablet TAKE ONE TABLET ONCE DAILY 90 tablet 1   telmisartan (MICARDIS) 80 MG tablet TAKE ONE TABLET ONCE DAILY 90 tablet 1   rosuvastatin (CRESTOR) 40 MG tablet Take 1 tablet (40 mg total) by mouth daily. 90 tablet 3   No current facility-administered medications for this visit.   Allergies  Allergen Reactions   Diltiazem Hcl Other (See Comments) and Cough    ? symptoms   Penicillins     Rash with Amoxicillin with Mono in college     Review of Systems:  All systems reviewed and negative except where noted in HPI.   Wt Readings from Last 3 Encounters:  05/01/22 210 lb (95.3 kg)  03/25/22 217 lb 11.2 oz (98.7 kg)  03/04/22 212 lb (96.2 kg)    Physical Exam   BP 124/64   Pulse 74   Ht 5\' 10"  (1.778 m)   Wt 210 lb (95.3 kg)   BMI 30.13 kg/m  Constitutional:  Pleasant, generally well appearing male in no acute distress. Psychiatric:  Normal mood and affect. Behavior is normal. EENT: Pupils normal.  Conjunctivae are normal. No scleral icterus. Neck supple.  Cardiovascular: Normal rate, regular rhythm.  Pulmonary/chest: Effort normal and breath sounds normal.  No wheezing, rales or rhonchi. Abdominal: Soft, nondistended, nontender. Bowel sounds active throughout. There are no masses palpable. No hepatomegaly. Neurological: Alert and oriented to person place and time. Skin: Skin is warm and dry. No rashes noted.  Tye Savoy, NP  05/01/2022, 11:43 AM  Cc:  Referring Provider Lincoln Brigham, PA-C

## 2022-05-22 ENCOUNTER — Ambulatory Visit (INDEPENDENT_AMBULATORY_CARE_PROVIDER_SITE_OTHER): Payer: Medicare Other

## 2022-05-22 DIAGNOSIS — E538 Deficiency of other specified B group vitamins: Secondary | ICD-10-CM | POA: Diagnosis not present

## 2022-05-22 MED ORDER — CYANOCOBALAMIN 1000 MCG/ML IJ SOLN
1000.0000 ug | Freq: Once | INTRAMUSCULAR | Status: AC
  Administered 2022-05-22: 1000 ug via INTRAMUSCULAR

## 2022-05-22 NOTE — Progress Notes (Signed)
Pt was given B12 w/o any complications. 

## 2022-06-11 ENCOUNTER — Telehealth: Payer: Self-pay | Admitting: Internal Medicine

## 2022-06-11 NOTE — Telephone Encounter (Signed)
Contacted Jonathon Armstrong to schedule their annual wellness visit. Appointment made for 06/19/22.  [name] direct phone # [phone]

## 2022-06-16 ENCOUNTER — Encounter: Payer: Medicare Other | Admitting: Gastroenterology

## 2022-06-18 ENCOUNTER — Encounter: Payer: Self-pay | Admitting: Gastroenterology

## 2022-06-18 ENCOUNTER — Ambulatory Visit (AMBULATORY_SURGERY_CENTER): Payer: Medicare Other | Admitting: Gastroenterology

## 2022-06-18 VITALS — BP 122/74 | HR 60 | Temp 97.5°F | Resp 19 | Ht 70.0 in | Wt 210.0 lb

## 2022-06-18 DIAGNOSIS — D128 Benign neoplasm of rectum: Secondary | ICD-10-CM | POA: Diagnosis not present

## 2022-06-18 DIAGNOSIS — I10 Essential (primary) hypertension: Secondary | ICD-10-CM | POA: Diagnosis not present

## 2022-06-18 DIAGNOSIS — D500 Iron deficiency anemia secondary to blood loss (chronic): Secondary | ICD-10-CM | POA: Diagnosis not present

## 2022-06-18 DIAGNOSIS — D125 Benign neoplasm of sigmoid colon: Secondary | ICD-10-CM | POA: Diagnosis not present

## 2022-06-18 DIAGNOSIS — K229 Disease of esophagus, unspecified: Secondary | ICD-10-CM

## 2022-06-18 DIAGNOSIS — K2950 Unspecified chronic gastritis without bleeding: Secondary | ICD-10-CM | POA: Diagnosis not present

## 2022-06-18 DIAGNOSIS — K635 Polyp of colon: Secondary | ICD-10-CM | POA: Diagnosis not present

## 2022-06-18 DIAGNOSIS — G4730 Sleep apnea, unspecified: Secondary | ICD-10-CM | POA: Diagnosis not present

## 2022-06-18 DIAGNOSIS — D123 Benign neoplasm of transverse colon: Secondary | ICD-10-CM

## 2022-06-18 DIAGNOSIS — D649 Anemia, unspecified: Secondary | ICD-10-CM

## 2022-06-18 DIAGNOSIS — E119 Type 2 diabetes mellitus without complications: Secondary | ICD-10-CM | POA: Diagnosis not present

## 2022-06-18 MED ORDER — SODIUM CHLORIDE 0.9 % IV SOLN: 500.0000 mL | Freq: Once | INTRAVENOUS | Status: DC

## 2022-06-18 NOTE — Progress Notes (Unsigned)
[facility] History and Physical   Primary Care Physician:  [name], MD   Reason for Procedure:  Iron deficiency  Plan:    EGD and colonoscopy with possible interventions as needed     HPI: Jonathon Armstrong is a very pleasant 72 y.o. male here for EGD and colonoscopy for evaluation of iron deficiency.   The risks and benefits as well as alternatives of endoscopic procedure(s) have been discussed and reviewed. All questions answered. The patient agrees to proceed.    Past Medical History:  Diagnosis Date   Allergy    seasonal   Asthma 06-30-2017   Asystole ([place])    "vagal response"post nasal polypectomy   Complication of anesthesia 06/30/2004   Coded x2 after nasal polyp removal (Believes vagal nerve response)   Diabetes mellitus without complication ([place])    pre but take metformin   Diverticulosis of colon    FH: colonic polyps    hx of   Hyperlipidemia    Hypertension    Mitral regurgitation    mild & aortic Valve calcification on 2d echo;sbe prophylaxis   Parotitis, acute 07-01-2011   Seasonal rhinitis    Sleep apnea    CPAP    Past Surgical History:  Procedure Laterality Date   APPENDECTOMY     asytole post nasal polypectomy  07/01/06   colonoscopy with polypectomy  06-30-2008   Dr Olevia Perches   heriorraphy     x2   [place] Left 08/16/2019   Procedure: Intercostal Nerve Block;  Surgeon: Lajuana Matte, MD;  Location: [facility];  Service: Thoracic;  Laterality: Left;   MIDDLE EAR SURGERY     TM replacement   NASAL SINUS [place], [id number]   X3   NODE DISSECTION  08/16/2019   Procedure: Node Dissection;  Surgeon: Lajuana Matte, MD;  Location: [facility];  Service: Thoracic;;   TONSILLECTOMY AND ADENOIDECTOMY     VIDEO BRONCHOSCOPY N/A 08/16/2019   Procedure: VIDEO BRONCHOSCOPY;  Surgeon: Lajuana Matte, MD;  Location: [facility];  Service: Thoracic;  Laterality: N/A;    Prior to Admission medications   Medication Sig  Start Date End Date Taking? Authorizing Provider  aspirin EC 81 MG tablet Take 1 tablet (81 mg total) by mouth daily. Swallow whole. 06/30/2020  Yes Lelon Perla, MD  Cholecalciferol (VITAMIN D-3 PO) Take by mouth daily.   Yes [provider]  Cyanocobalamin (VITAMIN B-12 IJ) Inject 1,000 mcg as directed every 30 (thirty) days.   Yes [provider]  cyanocobalamin (VITAMIN B12) 1000 MCG tablet Take 1,000 mcg by mouth daily.   Yes [provider]  ferrous sulfate 325 (65 FE) MG EC tablet Take 1 tablet (325 mg total) by mouth daily with breakfast. 03/31/22  Yes Thayil, Irene T, PA-C  icosapent Ethyl (VASCEPA) 1 g capsule Take 2 capsules (2 g total) by mouth 2 (two) times daily. Patient taking differently: Take 2 g by mouth 2 (two) times daily. Fish Oil 03/04/22  Yes Burns, Claudina Lick, MD  JARDIANCE 25 MG TABS tablet Take 1 tablet (25 mg total) by mouth daily before breakfast. 04/14/22  Yes Burns, Claudina Lick, MD  labetalol (NORMODYNE) 200 MG tablet Take 1 tablet (200 mg total) by mouth 2 (two) times daily. 07/02/21  Yes Lelon Perla, MD  metFORMIN (GLUCOPHAGE) 500 MG tablet TAKE TWO TABLETS BY MOUTH TWICE DAILY WITH FOOD 04/14/22  Yes [name], MD  RYBELSUS 7 MG TABS TAKE 1 TABLET(7 MG) BY MOUTH EVERY DAY 04/14/22  Yes Burns, Claudina Lick, MD  spironolactone (ALDACTONE) 25 MG tablet TAKE ONE TABLET ONCE DAILY 02/17/22  Yes Burns, Claudina Lick, MD  telmisartan (MICARDIS) 80 MG tablet TAKE ONE TABLET ONCE DAILY 04/14/22  Yes Lelon Perla, MD  FEROSUL 325 (65 Fe) MG tablet Take 325 mg by mouth every morning. 06/02/22   [provider]  rosuvastatin (CRESTOR) 40 MG tablet Take 1 tablet (40 mg total) by mouth daily. 07/02/21 10/04/21  Lelon Perla, MD    Current Outpatient Medications  Medication Sig Dispense Refill   aspirin EC 81 MG tablet Take 1 tablet (81 mg total) by mouth daily. Swallow whole. 90 tablet 3   Cholecalciferol (VITAMIN D-3 PO) Take by mouth daily.      Cyanocobalamin (VITAMIN B-12 IJ) Inject 1,000 mcg as directed every 30 (thirty) days.     cyanocobalamin (VITAMIN B12) 1000 MCG tablet Take 1,000 mcg by mouth daily.     ferrous sulfate 325 (65 FE) MG EC tablet Take 1 tablet (325 mg total) by mouth daily with breakfast. 30 tablet 3   icosapent Ethyl (VASCEPA) 1 g capsule Take 2 capsules (2 g total) by mouth 2 (two) times daily. (Patient taking differently: Take 2 g by mouth 2 (two) times daily. Fish Oil) 120 capsule 5   JARDIANCE 25 MG TABS tablet Take 1 tablet (25 mg total) by mouth daily before breakfast. 90 tablet 1   labetalol (NORMODYNE) 200 MG tablet Take 1 tablet (200 mg total) by mouth 2 (two) times daily. 180 tablet 3   metFORMIN (GLUCOPHAGE) 500 MG tablet TAKE TWO TABLETS BY MOUTH TWICE DAILY WITH FOOD 360 tablet 1   RYBELSUS 7 MG TABS TAKE 1 TABLET(7 MG) BY MOUTH EVERY DAY 30 tablet 5   spironolactone (ALDACTONE) 25 MG tablet TAKE ONE TABLET ONCE DAILY 90 tablet 1   telmisartan (MICARDIS) 80 MG tablet TAKE ONE TABLET ONCE DAILY 90 tablet 1   FEROSUL 325 (65 Fe) MG tablet Take 325 mg by mouth every morning.     rosuvastatin (CRESTOR) 40 MG tablet Take 1 tablet (40 mg total) by mouth daily. 90 tablet 3   Current Facility-Administered Medications  Medication Dose Route Frequency Provider Last Rate Last Admin   0.9 %  sodium chloride infusion  500 mL Intravenous Once Mauri Pole, MD        Allergies as of 06/18/2022 - Review Complete 06/18/2022  Allergen Reaction Noted   Diltiazem hcl Cough 01/19/2018   Diltiazem hcl Other (See Comments) and Cough    Penicillins      Family History  Problem Relation Age of Onset   Cancer Mother        liver &multiple myeloma   Heart disease Mother        CABG, Angina   Hyperlipidemia Mother    Hypertension Father    Heart disease Father        MI in 38"s   Dementia Father    COPD Sister    Other Sister        perforated bowel   Other Sister        cns aneurysm   Cancer  Maternal Grandfather        bone   Diabetes Neg Hx    Stroke Neg Hx    Colon cancer Neg Hx     Social History   Socioeconomic History   Marital status: Married  Spouse name: Not on file   Number of children: 2   Years of education: 76   Highest education level: Not on file  Occupational History   Occupation: Retired    Fish farm manager: RETIRED  Tobacco Use   Smoking status: Never   Smokeless tobacco: Never  Vaping Use   Vaping Use: Never used  Substance and Sexual Activity   Alcohol use: Yes    Alcohol/week: 2.0 standard drinks of alcohol    Types: 1 Glasses of wine, 1 Shots of liquor per week    Comment: socially < 2/ week   Drug use: No   Sexual activity: Yes  Other Topics Concern   Not on file  Social History Narrative   Fun: Water, swimming, [employer]; Volunteer work    Denies abuse and feels safe at home.   Regular exercise      Social Determinants of Health   Financial Resource Strain: Low Risk  (05/29/2021)   Overall Financial Resource Strain (CARDIA)    Difficulty of Paying Living Expenses: Not hard at all  Food Insecurity: No Food Insecurity (05/29/2021)   Hunger Vital Sign    Worried About Running Out of Food in the Last Year: Never true    Ran Out of Food in the Last Year: Never true  Transportation Needs: No Transportation Needs (05/29/2021)   PRAPARE - Hydrologist (Medical): No    Lack of Transportation (Non-Medical): No  Physical Activity: Sufficiently Active (05/29/2021)   Exercise Vital Sign    Days of Exercise per Week: 5 days    Minutes of Exercise per Session: 30 min  Stress: No Stress Concern Present (05/29/2021)   [place]    Feeling of Stress : Not at all  Social Connections: [place] (05/29/2021)   Social Connection and Isolation Panel [NHANES]    Frequency of Communication with Friends and Family: More than three times a week    Frequency of  Social Gatherings with Friends and Family: More than three times a week    Attends Religious Services: More than 4 times per year    Active Member of [employer] or Organizations: Yes    Attends Music therapist: More than 4 times per year    Marital Status: Married  Human resources officer Violence: Not At Risk (05/29/2021)   Humiliation, Afraid, Rape, and Kick questionnaire    Fear of Current or Ex-Partner: No    Emotionally Abused: No    Physically Abused: No    Sexually Abused: No    Review of Systems:  All other review of systems negative except as mentioned in the HPI.  Physical Exam: Vital signs in last 24 hours: Blood Pressure (Abnormal) 143/100   Pulse 65   Temperature (Abnormal) 97.5 F (36.4 C)   Respiration 15   Height 5\' 10"  (1.778 m)   Weight 210 lb (95.3 kg)   Oxygen Saturation 99%   Body Mass Index 30.13 kg/m  General:   Alert, NAD Lungs:  Clear .   Heart:  Regular rate and rhythm Abdomen:  Soft, nontender and nondistended. Neuro/Psych:  Alert and cooperative. Normal mood and affect. A and O x 3  Reviewed labs, radiology imaging, old records and pertinent past GI work up  Patient is appropriate for planned procedure(s) and anesthesia in an ambulatory setting   K. Denzil Magnuson , MD [phone]

## 2022-06-18 NOTE — Progress Notes (Signed)
Called to room to assist during endoscopic procedure.  Patient ID and intended procedure confirmed with present staff. Received instructions for my participation in the procedure from the performing physician.  

## 2022-06-18 NOTE — Progress Notes (Unsigned)
Pt's states no medical or surgical changes since previsit or office visit. 

## 2022-06-18 NOTE — Op Note (Signed)
Jonathon Armstrong Patient Name: Jonathon Armstrong Procedure Date: 06/18/2022 2:51 PM MRN: [id number] Endoscopist: Mauri Pole , MD, [id number] Age: 72 Referring MD:  Date of Birth: December 03, 1950 Gender: Male Account #: [id number] Procedure:                Colonoscopy Indications:              Unexplained iron deficiency anemia Medicines:                Monitored Anesthesia Care Procedure:                Pre-Anesthesia Assessment:                           - Prior to the procedure, a History and Physical                            was performed, and patient medications and                            allergies were reviewed. The patient's tolerance of                            previous anesthesia was also reviewed. The risks                            and benefits of the procedure and the sedation                            options and risks were discussed with the patient.                            All questions were answered, and informed consent                            was obtained. Prior Anticoagulants: The patient has                            taken no anticoagulant or antiplatelet agents. ASA                            Grade Assessment: III - A patient with severe                            systemic disease. After reviewing the risks and                            benefits, the patient was deemed in satisfactory                            condition to undergo the procedure.                           After obtaining informed consent, the colonoscope  was passed under direct vision. Throughout the                            procedure, the patient's blood pressure, pulse, and                            oxygen saturations were monitored continuously. The                            Olympus [id number]) Colonoscope was                            introduced through the anus and advanced to the the                            cecum, identified  by appendiceal orifice and                            ileocecal valve. The colonoscopy was performed                            without difficulty. The patient tolerated the                            procedure well. The quality of the bowel                            preparation was good. The ileocecal valve,                            appendiceal orifice, and rectum were photographed. Scope In: 3:12:25 PM Scope Out: 3:35:56 PM Scope Withdrawal Time: 0 hours 15 minutes 9 seconds  Total Procedure Duration: 0 hours 23 minutes 31 seconds  Findings:                 The perianal and digital rectal examinations were                            normal.                           Three sessile polyps were found in the rectum,                            sigmoid colon and transverse colon. The polyps were                            4 to 6 mm in size. These polyps were removed with a                            cold snare. Resection and retrieval were complete.                           Scattered large-mouthed, medium-mouthed and  small-mouthed diverticula were found in the sigmoid                            colon, descending colon and transverse colon. There                            was no evidence of diverticular bleeding.                           Non-bleeding external and internal hemorrhoids were                            found during retroflexion. The hemorrhoids were                            medium-sized. Complications:            No immediate complications. Estimated Blood Loss:     Estimated blood loss was minimal. Impression:               - Three 4 to 6 mm polyps in the rectum, in the                            sigmoid colon and in the transverse colon, removed                            with a cold snare. Resected and retrieved.                           - Moderate diverticulosis in the sigmoid colon, in                            the descending colon and in  the transverse colon.                            There was no evidence of diverticular bleeding.                           - Non-bleeding external and internal hemorrhoids. Recommendation:           - Patient has a contact number available for                            emergencies. The signs and symptoms of potential                            delayed complications were discussed with the                            patient. Return to normal activities tomorrow.                            Written discharge instructions were provided to the  patient.                           - Resume previous diet.                           - Continue present medications.                           - Await pathology results.                           - Repeat colonoscopy in 3 - 5 years for                            surveillance based on pathology results. Mauri Pole, MD 06/18/2022 3:54:21 PM This report has been signed electronically.

## 2022-06-18 NOTE — Op Note (Signed)
[facility] Patient Name: Jonathon Armstrong Procedure Date: 06/18/2022 2:51 PM MRN: [id number] Endoscopist: Mauri Pole , MD, [id number] Age: 72 Referring MD:  Date of Birth: Mar 13, 1951 Gender: Male Account #: [id number] Procedure:                Upper GI endoscopy Indications:              Suspected upper gastrointestinal bleeding in                            patient with unexplained iron deficiency anemia Medicines:                Monitored Anesthesia Care Procedure:                Pre-Anesthesia Assessment:                           - Prior to the procedure, a History and Physical                            was performed, and patient medications and                            allergies were reviewed. The patient's tolerance of                            previous anesthesia was also reviewed. The risks                            and benefits of the procedure and the sedation                            options and risks were discussed with the patient.                            All questions were answered, and informed consent                            was obtained. Prior Anticoagulants: The patient has                            taken no anticoagulant or antiplatelet agents. ASA                            Grade Assessment: III - A patient with severe                            systemic disease. After reviewing the risks and                            benefits, the patient was deemed in satisfactory                            condition to undergo the procedure.  After obtaining informed consent, the endoscope was                            passed under direct vision. Throughout the                            procedure, the patient's blood pressure, pulse, and                            oxygen saturations were monitored continuously. The                            GIF [id number] was introduced through the                            mouth,  and advanced to the second part of duodenum.                            The upper GI endoscopy was accomplished without                            difficulty. The patient tolerated the procedure                            well. Scope In: Scope Out: Findings:                 A single 14 mm mucosal nodule was found at the                            gastroesophageal junction, 40 cm from the incisors.                            The nodule was Paris classification Isp                            (subpedunculated). Biopsies were taken with a cold                            forceps for histology.                           The stomach was normal.                           The examined duodenum was normal.                           The cardia and gastric fundus were normal on                            retroflexion. Complications:            No immediate complications. Estimated Blood Loss:     Estimated blood loss was minimal. Impression:               -  Mucosal nodule found in the esophagus. Biopsied.                           - Normal stomach.                           - Normal examined duodenum. Recommendation:           - Resume previous diet.                           - Continue present medications.                           - Await pathology results.                           - Repeat upper endoscopy after studies are complete                            for surveillance based on pathology results. Mauri Pole, MD 06/18/2022 3:56:31 PM This report has been signed electronically.

## 2022-06-18 NOTE — Patient Instructions (Addendum)
Resume previous diet Continue present medications Await pathology results Handouts/information given for polyps, diverticulosis, hemorrhoids  YOU HAD AN ENDOSCOPIC PROCEDURE TODAY AT [facility]:   Refer to the procedure report that was given to you for any specific questions about what was found during the examination.  If the procedure report does not answer your questions, please call your gastroenterologist to clarify.  If you requested that your care partner not be given the details of your procedure findings, then the procedure report has been included in a sealed envelope for you to review at your convenience later.  YOU SHOULD EXPECT: Some feelings of bloating in the abdomen. Passage of more gas than usual.  Walking can help get rid of the air that was put into your GI tract during the procedure and reduce the bloating. If you had a lower endoscopy (such as a colonoscopy or flexible sigmoidoscopy) you may notice spotting of blood in your stool or on the toilet paper. If you underwent a bowel prep for your procedure, you may not have a normal bowel movement for a few days.  Please Note:  You might notice some irritation and congestion in your nose or some drainage.  This is from the oxygen used during your procedure.  There is no need for concern and it should clear up in a day or so.  SYMPTOMS TO REPORT IMMEDIATELY:  Following lower endoscopy (colonoscopy):  Excessive amounts of blood in the stool  Significant tenderness or worsening of abdominal pains  Swelling of the abdomen that is new, acute  Fever of 100F or higher Following upper endoscopy (EGD)  Vomiting of blood or coffee ground material  New chest pain or pain under the shoulder blades  Painful or persistently difficult swallowing  New shortness of breath  Black, tarry-looking stools For urgent or emergent issues, a gastroenterologist can be reached at any hour by calling [phone]. Do not use MyChart  messaging for urgent concerns.    DIET:  We do recommend a small meal at first, but then you may proceed to your regular diet.  Drink plenty of fluids but you should avoid alcoholic beverages for 24 hours.  ACTIVITY:  You should plan to take it easy for the rest of today and you should NOT DRIVE or use heavy machinery until tomorrow (because of the sedation medicines used during the test).    FOLLOW UP: Our staff will call the number listed on your records the next business day following your procedure.  We will call around 7:15- 8:00 am to check on you and address any questions or concerns that you may have regarding the information given to you following your procedure. If we do not reach you, we will leave a message.     If any biopsies were taken you will be contacted by phone or by letter within the next 1-3 weeks.  Please call us at [phone] if you have not heard about the biopsies in 3 weeks.    SIGNATURES/CONFIDENTIALITY: You and/or your care partner have signed paperwork which will be entered into your electronic medical record.  These signatures attest to the fact that that the information above on your After Visit Summary has been reviewed and is understood.  Full responsibility of the confidentiality of this discharge information lies with you and/or your care-partner.

## 2022-06-18 NOTE — Progress Notes (Unsigned)
Uneventful anesthetic. Report to pacu rn. Vss. Care resumed by rn. 

## 2022-06-19 ENCOUNTER — Ambulatory Visit (INDEPENDENT_AMBULATORY_CARE_PROVIDER_SITE_OTHER): Payer: Medicare Other

## 2022-06-19 ENCOUNTER — Encounter: Payer: Self-pay | Admitting: Family Medicine

## 2022-06-19 ENCOUNTER — Telehealth: Payer: Self-pay

## 2022-06-19 ENCOUNTER — Telehealth (INDEPENDENT_AMBULATORY_CARE_PROVIDER_SITE_OTHER): Payer: Medicare Other | Admitting: Family Medicine

## 2022-06-19 VITALS — BP 121/74 | HR 70 | Wt 210.0 lb

## 2022-06-19 DIAGNOSIS — Z0000 Encounter for general adult medical examination without abnormal findings: Secondary | ICD-10-CM

## 2022-06-19 DIAGNOSIS — E538 Deficiency of other specified B group vitamins: Secondary | ICD-10-CM | POA: Diagnosis not present

## 2022-06-19 MED ORDER — CYANOCOBALAMIN 1000 MCG/ML IJ SOLN
1000.0000 ug | Freq: Once | INTRAMUSCULAR | Status: AC
  Administered 2022-06-19: 1000 ug via INTRAMUSCULAR

## 2022-06-19 NOTE — Progress Notes (Signed)
Pt was given B12 injection with no complications. 

## 2022-06-19 NOTE — Telephone Encounter (Signed)
  Follow up Call-     06/18/2022    2:19 PM  Call back number  Post procedure Call Back phone  # [phone]  Permission to leave phone message Yes     Patient questions:  Do you have a fever, pain , or abdominal swelling? No. Pain Score  0 *  Have you tolerated food without any problems? Yes.    Have you been able to return to your normal activities? Yes.    Do you have any questions about your discharge instructions: Diet   No. Medications  No. Follow up visit  No.  Do you have questions or concerns about your Care? No.  Actions: * If pain score is 4 or above: No action needed, pain <4.

## 2022-06-19 NOTE — Patient Instructions (Addendum)
I really enjoyed getting to talk with you today! I am available on Tuesdays and Thursdays for virtual visits if you have any questions or concerns, or if I can be of any further assistance.   CHECKLIST FROM ANNUAL WELLNESS VISIT:  -Follow up (please call to schedule if not scheduled after visit):  -Inperson visit with your Primary Doctor office: schedule you regular follow up with labs, request foot exam at visit -yearly for annual wellness visit with primary care office  Here is a list of your preventive care/health maintenance measures and the plan for each if any are due:  Health Maintenance  Topic Date Due   DTaP/Tdap/Td (2 - Td or Tdap) 03/24/2014   FOOT EXAM  03/03/2019   COVID-19 Vaccine (5 - 2023-24 season) 07/05/2022 (Originally 11/22/2021)   Zoster Vaccines- Shingrix (2 of 2) 09/19/2022 (Originally 10/30/2021)   OPHTHALMOLOGY EXAM  08/15/2022   HEMOGLOBIN A1C  08/27/2022   Diabetic kidney evaluation - Urine ACR  02/26/2023   Diabetic kidney evaluation - eGFR measurement  03/26/2023   Medicare Annual Wellness (AWV)  06/19/2023   COLONOSCOPY (Pts 45-40yrs Insurance coverage will need to be confirmed)  06/17/2032   Pneumonia Vaccine 23+ Years old  Completed   INFLUENZA VACCINE  Completed   Hepatitis C Screening  Completed   HPV VACCINES  Aged Out    -See a dentist at least yearly  -Get your eyes checked and then per your eye specialist's recommendations  -Other issues addressed today:  -I have included below further information regarding a healthy whole foods based diet, physical activity guidelines for adults, stress management and opportunities for social connections. I hope you find this information useful.    -----------------------------------------------------------------------------------------------------------------------------------------------------------------------------------------------------------------------------------------------------------  NUTRITION: -eat real food: lots of colorful vegetables (half the plate) and fruits -5-7 servings of vegetables and fruits per day (fresh or steamed is best), exp. 2 servings of vegetables with lunch and dinner and 2 servings of fruit per day. Berries and greens such as kale and collards are great choices.  -consume on a regular basis: whole grains (make sure first ingredient on label contains the word "whole"), fresh fruits, fish, nuts, seeds, healthy oils (such as olive oil, avocado oil, grape seed oil) -may eat small amounts of dairy and lean meat on occasion, but avoid processed meats such as ham, bacon, lunch meat, etc. -drink water -try to avoid fast food and pre-packaged foods, processed meat -most experts advise limiting sodium to < 2300mg  per day, should limit further is any chronic conditions such as high blood pressure, heart disease, diabetes, etc. The American Heart Association advised that < 1500mg  is is ideal -try to avoid foods that contain any ingredients with names you do not recognize  -try to avoid sugar/sweets (except for the natural sugar that occurs in fresh fruit) -try to avoid sweet drinks -try to avoid white rice, white bread, pasta (unless whole grain), white or yellow potatoes  EXERCISE GUIDELINES FOR ADULTS: -if you wish to increase your physical activity, do so gradually and with the approval of your doctor -STOP and seek medical care immediately if you have any chest pain, chest discomfort or trouble breathing when starting or increasing exercise  -move and stretch your body, legs, feet and arms when sitting for long periods -Physical activity guidelines for optimal health in adults: -least 150 minutes per week of  aerobic exercise (can talk, but not sing) once approved by your doctor, 20-30 minutes of sustained activity or two 10 minute episodes of sustained  activity every day.  -resistance training at least 2 days per week if approved by your doctor -balance exercises 3+ days per week:   Stand somewhere where you have something sturdy to hold onto if you lose balance.    1) lift up on toes, start with 5x per day and work up to 20x   2) stand and lift on leg straight out to the side so that foot is a few inches of the floor, start with 5x each side and work up to 20x each side   3) stand on one foot, start with 5 seconds each side and work up to 20 seconds on each side  If you need ideas or help with getting more active:  -Silver sneakers https://tools.silversneakers.com  -Walk with a Doc: http://stephens-thompson.biz/  -try to include resistance (weight lifting/strength building) and balance exercises twice per week: or the following link for ideas: ChessContest.fr  UpdateClothing.com.cy  STRESS MANAGEMENT: -can try meditating, or just sitting quietly with deep breathing while intentionally relaxing all parts of your body for 5 minutes daily -if you need further help with stress, anxiety or depression please follow up with your primary doctor or contact the wonderful folks at [facility]: [place]: -options in [place] if you wish to engage in more social and exercise related activities:  -Silver sneakers https://tools.silversneakers.com  -Walk with a Doc: http://stephens-thompson.biz/  -Check out the [place] 50+ section on the [place] [employer] (hiking clubs, book clubs, cards and games, chess, exercise classes, aquatic classes and much more) - see the website for  details: https://www.[place].gov/departments/parks-recreation/active-adults50  -YouTube has lots of exercise videos for different ages and abilities as well  -[place] (a variety of indoor and outdoor inperson activities for adults). 4232687561. [address]  -Virtual Online Classes (a variety of topics): see seniorplanet.org or call (530)[phone]  -consider volunteering at a school, hospice center, church, senior center or elsewhere

## 2022-06-19 NOTE — Progress Notes (Signed)
PATIENT CHECK-IN and HEALTH RISK ASSESSMENT QUESTIONNAIRE:  -completed by phone/video for upcoming Medicare Preventive Visit  Pre-Visit Check-in: 1)Vitals (height, wt, BP, etc) - record in vitals section for visit on day of visit 2)Review and Update Medications, Allergies PMH, Surgeries, Social history in Epic 3)Hospitalizations in the last year with date/reason? no  4)Review and Update Care Team (patient's specialists) in Epic 5) Complete PHQ9 in Epic  6) Complete Fall Screening in Epic 7)Review all Health Maintenance Due and order under PCP if not done.  Medicare Wellness Patient Questionnaire:  Answer theses question about your habits: Do you drink alcohol? Yes-very little If yes, how many drinks do you have a day?Occasionally-maybe once every 2 weeks Have you ever smoked?no Quit date if applicable?   How many packs a day do/did you smoke?  Do you use smokeless tobacco?no Do you use an illicit drugs?no Do you exercises? Yes, Walking on outdoor trail 2-3 times per week, 3 miles - 1 hour Are you sexually active? No Number of partners? Typical breakfast: Oatmeal, eggs or a biscuit Typical lunch:small salad or burger and fries Typical dinner: Meat and 2 veggies Typical snacks: Apple, popcorn and potato chips  Beverages: water, Coke Zero, unsweetened tea  Answer theses question about you: Can you perform most household chores?no Do you find it hard to follow a conversation in a noisy room?no Do you often ask people to speak up or repeat themselves?yes - has seen audiologist and reports is mild - had eardrum replaced when he was 14 Do you feel that you have a problem with memory?no Do you balance your checkbook and or bank acounts?no-wife does Do you feel safe at home?yes Last dentist visit? 6 months ago Do you need assistance with any of the following: Please note if so   Driving?no  Feeding yourself?no   Getting from bed to chair?no  Getting to the [place] or  showering?no  Dressing yourself?no  Managing money?no  Climbing a flight of stairs-no  Preparing meals? no    Do you have Advanced Directives in place (Living Will, Healthcare Power or [place])? yes   Last eye Exam and location? [facility]-Dr Wood-1 year ago   Do you currently use prescribed or non-prescribed narcotic or opioid pain medications?no  Do you have a history or close family history of breast, ovarian, tubal or peritoneal cancer or a family member with BRCA (breast cancer susceptibility 1 and 2) gene mutations?  Nurse/Assistant Credentials/time stamp: Luellen Pucker   ----------------------------------------------------------------------------------------------------------------------------------------------------------------------------------------------------------------------    [place] VISIT WITH PROVIDER (Welcome to Medicare, initial annual wellness or annual wellness exam)  Virtual Visit via Video Note  I connected with [name] on 06/19/22  by a video enabled telemedicine application and verified that I am speaking with the correct person using two identifiers.  Location patient: home Location provider:work or home office Persons participating in the virtual visit: patient, provider  Concerns and/or follow up today: stable, had his colonoscopy and endoscopy yesterday   See HM section in Epic for other details of completed HM.    ROS: negative for report of fevers, unintentional weight loss, vision changes, vision loss, hearing loss or change, chest pain, sob, hemoptysis, melena, hematochezia, hematuria, falls, bleeding or bruising, loc, thoughts of suicide or self harm, memory loss  Patient-completed extensive health risk assessment - reviewed and discussed with the patient: See Health Risk Assessment completed with patient prior to the visit either above or in recent phone note. This was  reviewed in detailed with  the patient today and appropriate recommendations, orders and referrals were placed as needed per Summary below and patient instructions.   Review of Medical History: -PMH, PSH, Family History and current specialty and care providers reviewed and updated and listed below   Patient Care Team: [name], MD as PCP - General (Internal Medicine) Stanford Breed Denice Bors, MD as PCP - Cardiology (Cardiology) [place], Heather, [place] as Consulting Physician (Optometry) [place], Darnelle Maffucci, [facility] (Inactive) (Pharmacist)   Past Medical History:  Diagnosis Date   Allergy    seasonal   Asthma Jul 14, 2017   Asystole ([place])    "vagal response"post nasal polypectomy   Complication of anesthesia 07-14-2004   Coded x2 after nasal polyp removal (Believes vagal nerve response)   Diabetes mellitus without complication ([place])    pre but take metformin   Diverticulosis of colon    FH: colonic polyps    hx of   Hyperlipidemia    Hypertension    Mitral regurgitation    mild & aortic Valve calcification on 2d echo;sbe prophylaxis   Parotitis, acute Jul 15, 2011   Seasonal rhinitis    Sleep apnea    CPAP    Past Surgical History:  Procedure Laterality Date   APPENDECTOMY     asytole post nasal polypectomy  2006/07/15   colonoscopy with polypectomy  2008/07/14   Dr Olevia Perches   heriorraphy     x2   [place] Left 08/16/2019   Procedure: Intercostal Nerve Block;  Surgeon: Lajuana Matte, MD;  Location: [facility];  Service: Thoracic;  Laterality: Left;   MIDDLE EAR SURGERY     TM replacement   NASAL SINUS SURGERY  07/14/1992, [id number]   X3   NODE DISSECTION  08/16/2019   Procedure: Node Dissection;  Surgeon: Lajuana Matte, MD;  Location: [facility];  Service: Thoracic;;   TONSILLECTOMY AND ADENOIDECTOMY     VIDEO BRONCHOSCOPY N/A 08/16/2019   Procedure: VIDEO BRONCHOSCOPY;  Surgeon: Lajuana Matte, MD;  Location: [facility];  Service: Thoracic;  Laterality: N/A;    Social History   Socioeconomic History    Marital status: Married    Spouse name: Not on file   Number of children: 2   Years of education: 18   Highest education level: Not on file  Occupational History   Occupation: Retired    Fish farm manager: RETIRED  Tobacco Use   Smoking status: Never   Smokeless tobacco: Never  Vaping Use   Vaping Use: Never used  Substance and Sexual Activity   Alcohol use: Yes    Alcohol/week: 2.0 standard drinks of alcohol    Types: 1 Glasses of wine, 1 Shots of liquor per week    Comment: socially < 2/ week   Drug use: No   Sexual activity: Yes  Other Topics Concern   Not on file  Social History Narrative   Fun: Water, swimming, [employer]; Volunteer work    Denies abuse and feels safe at home.   Regular exercise      Social Determinants of Health   Financial Resource Strain: Low Risk  (05/29/2021)   Overall Financial Resource Strain (CARDIA)    Difficulty of Paying Living Expenses: Not hard at all  Food Insecurity: No Food Insecurity (05/29/2021)   Hunger Vital Sign    Worried About Running Out of Food in the Last Year: Never true    Ran Out of Food in the Last Year: Never  true  Transportation Needs: No Transportation Needs (05/29/2021)   PRAPARE - Hydrologist (Medical): No    Lack of Transportation (Non-Medical): No  Physical Activity: Sufficiently Active (05/29/2021)   Exercise Vital Sign    Days of Exercise per Week: 5 days    Minutes of Exercise per Session: 30 min  Stress: No Stress Concern Present (05/29/2021)   [place]    Feeling of Stress : Not at all  Social Connections: [place] (05/29/2021)   Social Connection and Isolation Panel [NHANES]    Frequency of Communication with Friends and Family: More than three times a week    Frequency of Social Gatherings with Friends and Family: More than three times a week    Attends Religious Services: More than 4 times per year    Active  Member of [employer] or Organizations: Yes    Attends Music therapist: More than 4 times per year    Marital Status: Married  Human resources officer Violence: Not At Risk (05/29/2021)   Humiliation, Afraid, Rape, and Kick questionnaire    Fear of Current or Ex-Partner: No    Emotionally Abused: No    Physically Abused: No    Sexually Abused: No    Family History  Problem Relation Age of Onset   Cancer Mother        liver &multiple myeloma   Heart disease Mother        CABG, Angina   Hyperlipidemia Mother    Hypertension Father    Heart disease Father        MI in 1"s   Dementia Father    COPD Sister    Other Sister        perforated bowel   Other Sister        cns aneurysm   Cancer Maternal Grandfather        bone   Diabetes Neg Hx    Stroke Neg Hx    Colon cancer Neg Hx     Current Outpatient Medications on File Prior to Visit  Medication Sig Dispense Refill   aspirin EC 81 MG tablet Take 1 tablet (81 mg total) by mouth daily. Swallow whole. 90 tablet 3   Cholecalciferol (VITAMIN D-3 PO) Take by mouth daily.     Cyanocobalamin (VITAMIN B-12 IJ) Inject 1,000 mcg as directed every 30 (thirty) days.     cyanocobalamin (VITAMIN B12) 1000 MCG tablet Take 1,000 mcg by mouth daily.     FEROSUL 325 (65 Fe) MG tablet Take 325 mg by mouth every morning.     ferrous sulfate 325 (65 FE) MG EC tablet Take 1 tablet (325 mg total) by mouth daily with breakfast. 30 tablet 3   icosapent Ethyl (VASCEPA) 1 g capsule Take 2 capsules (2 g total) by mouth 2 (two) times daily. (Patient taking differently: Take 2 g by mouth 2 (two) times daily. Fish Oil) 120 capsule 5   JARDIANCE 25 MG TABS tablet Take 1 tablet (25 mg total) by mouth daily before breakfast. 90 tablet 1   labetalol (NORMODYNE) 200 MG tablet Take 1 tablet (200 mg total) by mouth 2 (two) times daily. 180 tablet 3   metFORMIN (GLUCOPHAGE) 500 MG tablet TAKE TWO TABLETS BY MOUTH TWICE DAILY WITH FOOD 360 tablet 1   RYBELSUS 7 MG  TABS TAKE 1 TABLET(7 MG) BY MOUTH EVERY DAY 30 tablet 5   spironolactone (ALDACTONE) 25 MG tablet  TAKE ONE TABLET ONCE DAILY 90 tablet 1   telmisartan (MICARDIS) 80 MG tablet TAKE ONE TABLET ONCE DAILY 90 tablet 1   rosuvastatin (CRESTOR) 40 MG tablet Take 1 tablet (40 mg total) by mouth daily. 90 tablet 3   No current facility-administered medications on file prior to visit.    Allergies  Allergen Reactions   Diltiazem Hcl Cough   Diltiazem Hcl Other (See Comments) and Cough    ? symptoms   Penicillins     Rash with Amoxicillin with Mono in college       Physical Exam Vitals:   06/19/22 1023  BP: 121/74  Pulse: 70   Estimated body mass index is 30.13 kg/m as calculated from the following:   Height as of 06/18/22: 5\' 10"  (1.778 m).   Weight as of this encounter: 210 lb (95.3 kg).  EKG (optional): deferred due to virtual visit  GENERAL: alert, oriented, no acute distress detected; full vision exam deferred due to pandemic and/or virtual encounter  HEENT: atraumatic, conjunttiva clear, no obvious abnormalities on inspection of external nose and ears  NECK: normal movements of the head and neck  LUNGS: on inspection no signs of respiratory distress, breathing rate appears normal, no obvious gross SOB, gasping or wheezing  CV: no obvious cyanosis  MS: moves all visible extremities without noticeable abnormality  PSYCH/NEURO: pleasant and cooperative, no obvious depression or anxiety, speech and thought processing grossly intact, Cognitive function grossly intact  Flowsheet Row Video Visit from 06/19/2022 in [facility] [place]  PHQ-9 Total Score 0           06/19/2022   10:25 AM 03/04/2022    8:00 AM 11/11/2021    8:06 AM 09/09/2021   11:40 AM 08/08/2021    2:49 PM  Depression screen PHQ 2/9  Decreased Interest 0 0 0 0 1  Down, Depressed, Hopeless 0 0 0 0 1  PHQ - 2 Score 0 0 0 0 2  Altered sleeping 0 0 0 0 2  Tired, decreased energy 0 0  0 1 1  Change in appetite 0 0 0 0 1  Feeling bad or failure about yourself  0 0 0 0 0  Trouble concentrating 0 0 0 0 1  Moving slowly or fidgety/restless 0 0 0 0 1  Suicidal thoughts 0 0 0 0 0  PHQ-9 Score 0 0 0 1 8  Difficult doing work/chores  Not difficult at all Not difficult at all Not difficult at all Not difficult at all       11/11/2021    8:06 AM 11/11/2021    8:38 AM 03/04/2022    8:00 AM 03/25/2022   11:19 AM 06/19/2022   10:25 AM  Fall Risk  Falls in the past year? 0 0 0  0  Was there an injury with Fall? 0 0 0  0  Fall Risk Category Calculator 0 0 0  0  Fall Risk Category (Retired) Low Low Low    (RETIRED) Patient Fall Risk Level Low fall risk Low fall risk Low fall risk Low fall risk   Patient at Risk for Falls Due to No Fall Risks No Fall Risks No Fall Risks  No Fall Risks  Fall risk Follow up Falls evaluation completed Falls evaluation completed Falls evaluation completed  Falls evaluation completed     SUMMARY AND PLAN:  Encounter for Medicare annual wellness exam    Discussed applicable health maintenance/preventive health measures and advised and referred  or ordered per patient preferences:  Health Maintenance  Topic Date Due   DTaP/Tdap/Td (2 - Td or Tdap) 03/24/2014 - he plans to get at the pharmacy   FOOT EXAM  03/03/2019, plans to do with PCP at visit in may   COVID-19 Vaccine (5 - 2023-24 season) 07/05/2022 (Originally 11/22/2021) - reports had most recent booster a few months ago   Zoster Vaccines- Shingrix (2 of 2) 09/19/2022 (Originally 10/30/2021) - had reaction to 1st shot and told not to get again   OPHTHALMOLOGY EXAM  08/15/2022   HEMOGLOBIN A1C  08/27/2022   Diabetic kidney evaluation - Urine ACR  02/26/2023   Diabetic kidney evaluation - eGFR measurement  03/26/2023   Medicare Annual Wellness (AWV)  06/19/2023   COLONOSCOPY (Pts 45-71yrs Insurance coverage will need to be confirmed)  06/17/2032   Pneumonia Vaccine 32+ Years old  Completed    INFLUENZA VACCINE  Completed   Hepatitis C Screening  Completed   HPV VACCINES  Aged Out    Education and counseling on the following was provided based on the above review of health and a plan/checklist for the patient, along with additional information discussed, was provided for the patient in the patient instructions :  -Advised and counseled on a whole foods based healthy diet and regular exercise: A summary of a healthy diet was provided in the Patient Instructions.Recommended regular exercise and discussed options within the community and at home. He currently doesn't walk in the winter - discussed options for and encouraged to continue exercise year round. Discussed exercise guidelines - provided along with further resources in patient instructions.  -Advise yearly dental visits at minimum and regular eye exams -Advised to schedule follow up with PCP for routine check up in may or June and labs/foot exam.  Follow up: see patient instructions   Patient Instructions  I really enjoyed getting to talk with you today! I am available on Tuesdays and Thursdays for virtual visits if you have any questions or concerns, or if I can be of any further assistance.   CHECKLIST FROM ANNUAL WELLNESS VISIT:  -Follow up (please call to schedule if not scheduled after visit):  -Inperson visit with your Primary Doctor office: schedule you regular follow up with labs, request foot exam at visit -yearly for annual wellness visit with primary care office  Here is a list of your preventive care/health maintenance measures and the plan for each if any are due:  Health Maintenance  Topic Date Due   DTaP/Tdap/Td (2 - Td or Tdap) 03/24/2014   FOOT EXAM  03/03/2019   COVID-19 Vaccine (5 - 2023-24 season) 07/05/2022 (Originally 11/22/2021)   Zoster Vaccines- Shingrix (2 of 2) 09/19/2022 (Originally 10/30/2021)   OPHTHALMOLOGY EXAM  08/15/2022   HEMOGLOBIN A1C  08/27/2022   Diabetic kidney evaluation - Urine ACR   02/26/2023   Diabetic kidney evaluation - eGFR measurement  03/26/2023   Medicare Annual Wellness (AWV)  06/19/2023   COLONOSCOPY (Pts 45-31yrs Insurance coverage will need to be confirmed)  06/17/2032   Pneumonia Vaccine 5+ Years old  Completed   INFLUENZA VACCINE  Completed   Hepatitis C Screening  Completed   HPV VACCINES  Aged Out    -See a dentist at least yearly  -Get your eyes checked and then per your eye specialist's recommendations  -Other issues addressed today:  -I have included below further information regarding a healthy whole foods based diet, physical activity guidelines for adults, stress management and opportunities for social  connections. I hope you find this information useful.   -----------------------------------------------------------------------------------------------------------------------------------------------------------------------------------------------------------------------------------------------------------  NUTRITION: -eat real food: lots of colorful vegetables (half the plate) and fruits -5-7 servings of vegetables and fruits per day (fresh or steamed is best), exp. 2 servings of vegetables with lunch and dinner and 2 servings of fruit per day. Berries and greens such as kale and collards are great choices.  -consume on a regular basis: whole grains (make sure first ingredient on label contains the word "whole"), fresh fruits, fish, nuts, seeds, healthy oils (such as olive oil, avocado oil, grape seed oil) -may eat small amounts of dairy and lean meat on occasion, but avoid processed meats such as ham, bacon, lunch meat, etc. -drink water -try to avoid fast food and pre-packaged foods, processed meat -most experts advise limiting sodium to < 2300mg  per day, should limit further is any chronic conditions such as high blood pressure, heart disease, diabetes, etc. The American Heart Association advised that < 1500mg  is is ideal -try to avoid foods  that contain any ingredients with names you do not recognize  -try to avoid sugar/sweets (except for the natural sugar that occurs in fresh fruit) -try to avoid sweet drinks -try to avoid white rice, white bread, pasta (unless whole grain), white or yellow potatoes  EXERCISE GUIDELINES FOR ADULTS: -if you wish to increase your physical activity, do so gradually and with the approval of your doctor -STOP and seek medical care immediately if you have any chest pain, chest discomfort or trouble breathing when starting or increasing exercise  -move and stretch your body, legs, feet and arms when sitting for long periods -Physical activity guidelines for optimal health in adults: -least 150 minutes per week of aerobic exercise (can talk, but not sing) once approved by your doctor, 20-30 minutes of sustained activity or two 10 minute episodes of sustained activity every day.  -resistance training at least 2 days per week if approved by your doctor -balance exercises 3+ days per week:   Stand somewhere where you have something sturdy to hold onto if you lose balance.    1) lift up on toes, start with 5x per day and work up to 20x   2) stand and lift on leg straight out to the side so that foot is a few inches of the floor, start with 5x each side and work up to 20x each side   3) stand on one foot, start with 5 seconds each side and work up to 20 seconds on each side  If you need ideas or help with getting more active:  -Silver sneakers https://tools.silversneakers.com  -Walk with a Doc: http://stephens-thompson.biz/  -try to include resistance (weight lifting/strength building) and balance exercises twice per week: or the following link for ideas: ChessContest.fr  UpdateClothing.com.cy  STRESS MANAGEMENT: -can try meditating, or just sitting quietly with deep breathing while intentionally relaxing all  parts of your body for 5 minutes daily -if you need further help with stress, anxiety or depression please follow up with your primary doctor or contact the wonderful folks at [place]: [place]: -options in [place] if you wish to engage in more social and exercise related activities:  -Silver sneakers https://tools.silversneakers.com  -Walk with a Doc: http://stephens-thompson.biz/  -Check out the [place] 50+ section on the [place] [employer] (hiking clubs, book clubs, cards and games, chess, exercise classes, aquatic classes and much more) - see the website for details: https://www.[place].gov/departments/parks-recreation/active-adults50  -YouTube has lots of exercise  videos for different ages and abilities as well  -[place] (a variety of indoor and outdoor inperson activities for adults). [phone]. [address]  -Virtual Online Classes (a variety of topics): see seniorplanet.org or call [phone]  -consider volunteering at a school, hospice center, church, senior center or elsewhere           Lucretia Kern, DO

## 2022-06-20 ENCOUNTER — Encounter: Payer: Self-pay | Admitting: Gastroenterology

## 2022-06-25 ENCOUNTER — Inpatient Hospital Stay: Payer: Medicare Other | Attending: Physician Assistant | Admitting: Physician Assistant

## 2022-06-25 ENCOUNTER — Inpatient Hospital Stay: Payer: Medicare Other

## 2022-06-25 ENCOUNTER — Other Ambulatory Visit: Payer: Self-pay | Admitting: Physician Assistant

## 2022-06-25 VITALS — BP 106/62 | HR 75 | Temp 97.3°F | Resp 20 | Wt 214.6 lb

## 2022-06-25 DIAGNOSIS — D649 Anemia, unspecified: Secondary | ICD-10-CM

## 2022-06-25 DIAGNOSIS — D509 Iron deficiency anemia, unspecified: Secondary | ICD-10-CM | POA: Insufficient documentation

## 2022-06-25 DIAGNOSIS — Z79899 Other long term (current) drug therapy: Secondary | ICD-10-CM | POA: Insufficient documentation

## 2022-06-25 DIAGNOSIS — D508 Other iron deficiency anemias: Secondary | ICD-10-CM | POA: Diagnosis not present

## 2022-06-25 LAB — CBC WITH DIFFERENTIAL (CANCER CENTER ONLY)
Abs Immature Granulocytes: 0.02 10*3/uL (ref 0.00–0.07)
Basophils Absolute: 0 10*3/uL (ref 0.0–0.1)
Basophils Relative: 0 %
Eosinophils Absolute: 0.1 10*3/uL (ref 0.0–0.5)
Eosinophils Relative: 1 %
HCT: 32.2 % — ABNORMAL LOW (ref 39.0–52.0)
Hemoglobin: 11.4 g/dL — ABNORMAL LOW (ref 13.0–17.0)
Immature Granulocytes: 0 %
Lymphocytes Relative: 25 %
Lymphs Abs: 1.7 10*3/uL (ref 0.7–4.0)
MCH: 30.8 pg (ref 26.0–34.0)
MCHC: 35.4 g/dL (ref 30.0–36.0)
MCV: 87 fL (ref 80.0–100.0)
Monocytes Absolute: 0.5 10*3/uL (ref 0.1–1.0)
Monocytes Relative: 8 %
Neutro Abs: 4.4 10*3/uL (ref 1.7–7.7)
Neutrophils Relative %: 66 %
Platelet Count: 168 10*3/uL (ref 150–400)
RBC: 3.7 MIL/uL — ABNORMAL LOW (ref 4.22–5.81)
RDW: 14.2 % (ref 11.5–15.5)
WBC Count: 6.7 10*3/uL (ref 4.0–10.5)
nRBC: 0 % (ref 0.0–0.2)

## 2022-06-25 LAB — CMP (CANCER CENTER ONLY)
ALT: 18 U/L (ref 0–44)
AST: 18 U/L (ref 15–41)
Albumin: 4.6 g/dL (ref 3.5–5.0)
Alkaline Phosphatase: 62 U/L (ref 38–126)
Anion gap: 10 (ref 5–15)
BUN: 22 mg/dL (ref 8–23)
CO2: 23 mmol/L (ref 22–32)
Calcium: 10.2 mg/dL (ref 8.9–10.3)
Chloride: 107 mmol/L (ref 98–111)
Creatinine: 1.37 mg/dL — ABNORMAL HIGH (ref 0.61–1.24)
GFR, Estimated: 55 mL/min — ABNORMAL LOW (ref >60)
Glucose, Bld: 147 mg/dL — ABNORMAL HIGH (ref 70–99)
Potassium: 4.3 mmol/L (ref 3.5–5.1)
Sodium: 140 mmol/L (ref 135–145)
Total Bilirubin: 0.5 mg/dL (ref 0.3–1.2)
Total Protein: 7 g/dL (ref 6.5–8.1)

## 2022-06-25 LAB — IRON AND IRON BINDING CAPACITY (CC-WL,HP ONLY)
Iron: 60 ug/dL (ref 45–182)
Saturation Ratios: 14 % — ABNORMAL LOW (ref 17.9–39.5)
TIBC: 424 ug/dL (ref 250–450)
UIBC: 364 ug/dL (ref 117–376)

## 2022-06-25 LAB — FERRITIN: Ferritin: 36 ng/mL (ref 24–336)

## 2022-06-25 NOTE — Progress Notes (Unsigned)
[place] [phone]   [phone]  PROGRESS NOTE  Patient Care Team: [name], MD as PCP - General (Internal Medicine) Stanford Breed Denice Bors, MD as PCP - Cardiology (Cardiology) [place], [place], [place] as Consulting Physician (Optometry) Delice Bison, Darnelle Maffucci, [facility] (Inactive) (Pharmacist)   CHIEF COMPLAINTS/PURPOSE OF CONSULTATION:  Iron deficiency anemia  HISTORY OF PRESENTING ILLNESS:  Jonathon Armstrong 72 y.o. male returns for a follow up for iron deficiency anemia. He is unaccompanied for this visit.   On exam today, Mr. [name] reports he is feeling well without a new or concerning symptoms. He is compliant with taking his iron pills daily as prescribed. He tries to stay active and  is able to complete his ADLs on his own. He enjoys taking care of his grandchildren. He denies any appetite changes or weight loss. He denies easy bruising or signs of active bleeding.  He denies nausea, vomiting or abdominal pain. Bowel habits are regular without recurrent episodes of diarrhea or constipation. He denies fevers, chills, sweats, shortness of breath, chest pain or cough. He has no other complaints. Rest of the 10 point ROS is below.   MEDICAL HISTORY:  Past Medical History:  Diagnosis Date   Allergy    seasonal   Asthma 07/03/17   Asystole ([place])    "vagal response"post nasal polypectomy   Complication of anesthesia 2004/07/03   Coded x2 after nasal polyp removal (Believes vagal nerve response)   Diabetes mellitus without complication ([place])    pre but take metformin   Diverticulosis of colon    FH: colonic polyps    hx of   Hyperlipidemia    Hypertension    Mitral regurgitation    mild & aortic Valve calcification on 2d echo;sbe prophylaxis   Parotitis, acute 07-04-2011   Seasonal rhinitis    Sleep apnea    CPAP    SURGICAL HISTORY: Past Surgical History:  Procedure Laterality Date   APPENDECTOMY     asytole post nasal polypectomy  07-04-2006   colonoscopy with  polypectomy  2008/07/03   Dr Olevia Perches   heriorraphy     x2   [place] Left 08/16/2019   Procedure: Intercostal Nerve Block;  Surgeon: Lajuana Matte, MD;  Location:  OR;  Service: Thoracic;  Laterality: Left;   MIDDLE EAR SURGERY     TM replacement   NASAL SINUS SURGERY  03-Jul-1992, [id number]   X3   NODE DISSECTION  08/16/2019   Procedure: Node Dissection;  Surgeon: Lajuana Matte, MD;  Location: [facility];  Service: Thoracic;;   TONSILLECTOMY AND ADENOIDECTOMY     VIDEO BRONCHOSCOPY N/A 08/16/2019   Procedure: VIDEO BRONCHOSCOPY;  Surgeon: Lajuana Matte, MD;  Location: [facility] OR;  Service: Thoracic;  Laterality: N/A;    SOCIAL HISTORY: Social History   Socioeconomic History   Marital status: Married    Spouse name: Not on file   Number of children: 2   Years of education: 18   Highest education level: Not on file  Occupational History   Occupation: Retired    Fish farm manager: RETIRED  Tobacco Use   Smoking status: Never   Smokeless tobacco: Never  Vaping Use   Vaping Use: Never used  Substance and Sexual Activity   Alcohol use: Yes    Alcohol/week: 2.0 standard drinks of alcohol    Types: 1 Glasses of wine, 1 Shots of liquor per week    Comment: socially < 2/  week   Drug use: No   Sexual activity: Yes  Other Topics Concern   Not on file  Social History Narrative   Fun: Water, swimming, [employer]; Volunteer work    Denies abuse and feels safe at home.   Regular exercise      Social Determinants of Health   Financial Resource Strain: Low Risk  (05/29/2021)   Overall Financial Resource Strain (CARDIA)    Difficulty of Paying Living Expenses: Not hard at all  Food Insecurity: No Food Insecurity (05/29/2021)   Hunger Vital Sign    Worried About Running Out of Food in the Last Year: Never true    Ran Out of Food in the Last Year: Never true  Transportation Needs: No Transportation Needs (05/29/2021)   PRAPARE - Radiographer, therapeutic (Medical): No    Lack of Transportation (Non-Medical): No  Physical Activity: Sufficiently Active (05/29/2021)   Exercise Vital Sign    Days of Exercise per Week: 5 days    Minutes of Exercise per Session: 30 min  Stress: No Stress Concern Present (05/29/2021)   [place]    Feeling of Stress : Not at all  Social Connections: [place] (05/29/2021)   Social Connection and Isolation Panel [NHANES]    Frequency of Communication with Friends and Family: More than three times a week    Frequency of Social Gatherings with Friends and Family: More than three times a week    Attends Religious Services: More than 4 times per year    Active Member of [employer] or Organizations: Yes    Attends Music therapist: More than 4 times per year    Marital Status: Married  Human resources officer Violence: Not At Risk (05/29/2021)   Humiliation, Afraid, Rape, and Kick questionnaire    Fear of Current or Ex-Partner: No    Emotionally Abused: No    Physically Abused: No    Sexually Abused: No    FAMILY HISTORY: Family History  Problem Relation Age of Onset   Cancer Mother        liver &multiple myeloma   Heart disease Mother        CABG, Angina   Hyperlipidemia Mother    Hypertension Father    Heart disease Father        MI in 92"s   Dementia Father    COPD Sister    Other Sister        perforated bowel   Other Sister        cns aneurysm   Cancer Maternal Grandfather        bone   Diabetes Neg Hx    Stroke Neg Hx    Colon cancer Neg Hx     ALLERGIES:  is allergic to diltiazem hcl, diltiazem hcl, and penicillins.  MEDICATIONS:  Current Outpatient Medications  Medication Sig Dispense Refill   aspirin EC 81 MG tablet Take 1 tablet (81 mg total) by mouth daily. Swallow whole. 90 tablet 3   Cholecalciferol (VITAMIN D-3 PO) Take by mouth daily.     Cyanocobalamin (VITAMIN B-12 IJ) Inject 1,000 mcg as  directed every 30 (thirty) days.     cyanocobalamin (VITAMIN B12) 1000 MCG tablet Take 1,000 mcg by mouth daily.     FEROSUL 325 (65 Fe) MG tablet Take 325 mg by mouth every morning.     ferrous sulfate 325 (65 FE) MG EC tablet Take 1 tablet (  325 mg total) by mouth daily with breakfast. 30 tablet 3   icosapent Ethyl (VASCEPA) 1 g capsule Take 2 capsules (2 g total) by mouth 2 (two) times daily. (Patient taking differently: Take 2 g by mouth 2 (two) times daily. Fish Oil) 120 capsule 5   JARDIANCE 25 MG TABS tablet Take 1 tablet (25 mg total) by mouth daily before breakfast. 90 tablet 1   labetalol (NORMODYNE) 200 MG tablet Take 1 tablet (200 mg total) by mouth 2 (two) times daily. 180 tablet 3   metFORMIN (GLUCOPHAGE) 500 MG tablet TAKE TWO TABLETS BY MOUTH TWICE DAILY WITH FOOD 360 tablet 1   RYBELSUS 7 MG TABS TAKE 1 TABLET(7 MG) BY MOUTH EVERY DAY 30 tablet 5   spironolactone (ALDACTONE) 25 MG tablet TAKE ONE TABLET ONCE DAILY 90 tablet 1   telmisartan (MICARDIS) 80 MG tablet TAKE ONE TABLET ONCE DAILY 90 tablet 1   rosuvastatin (CRESTOR) 40 MG tablet Take 1 tablet (40 mg total) by mouth daily. 90 tablet 3   No current facility-administered medications for this visit.    REVIEW OF SYSTEMS:   Constitutional: ( - ) fevers, ( - )  chills , ( - ) night sweats Eyes: ( - ) blurriness of vision, ( - ) double vision, ( - ) watery eyes Ears, nose, mouth, throat, and face: ( - ) mucositis, ( - ) sore throat Respiratory: ( - ) cough, ( - ) dyspnea, ( - ) wheezes Cardiovascular: ( - ) palpitation, ( - ) chest discomfort, ( - ) lower extremity swelling Gastrointestinal:  ( - ) nausea, ( - ) heartburn, ( - ) change in bowel habits Skin: ( - ) abnormal skin rashes Lymphatics: ( - ) new lymphadenopathy, ( - ) easy bruising Neurological: ( - ) numbness, ( - ) tingling, ( - ) new weaknesses Behavioral/Psych: ( - ) mood change, ( - ) new changes  All other systems were reviewed with the patient and are  negative.  PHYSICAL EXAMINATION: ECOG PERFORMANCE STATUS: 0 - Asymptomatic  Vitals:   06/25/22 1025  BP: 106/62  Pulse: 75  Resp: 20  Temp: (!) 97.3 F (36.3 C)  SpO2: 98%   Filed Weights   06/25/22 1025  Weight: 214 lb 9.6 oz (97.3 kg)    GENERAL: well appearing male in NAD  SKIN: skin color, texture, turgor are normal, no rashes or significant lesions EYES: conjunctiva are pink and non-injected, sclera clear LUNGS: clear to auscultation and percussion with normal breathing effort HEART: regular rate & rhythm and no murmurs and no lower extremity edema Musculoskeletal: no cyanosis of digits and no clubbing  PSYCH: alert & oriented x 3, fluent speech NEURO: no focal motor/sensory deficits  LABORATORY DATA:  I have reviewed the data as listed    Latest Ref Rng & Units 06/25/2022   10:00 AM 03/25/2022   12:43 PM 02/25/2022    7:56 AM  CBC  WBC 4.0 - 10.5 K/uL 6.7  5.3  5.7   Hemoglobin 13.0 - 17.0 g/dL 11.4  11.1  11.2   Hematocrit 39.0 - 52.0 % 32.2  32.9  32.2   Platelets 150 - 400 K/uL 168  146  134.0        Latest Ref Rng & Units 03/25/2022   12:43 PM 02/25/2022    7:56 AM 11/05/2021    7:55 AM  CMP  Glucose 70 - 99 mg/dL 119  132  148   BUN 8 - 23  mg/dL 18  20  21    Creatinine 0.61 - 1.24 mg/dL 1.15  1.30  1.31   Sodium 135 - 145 mmol/L 140  141  139   Potassium 3.5 - 5.1 mmol/L 4.2  4.3  4.3   Chloride 98 - 111 mmol/L 106  108  105   CO2 22 - 32 mmol/L 25  24  23    Calcium 8.9 - 10.3 mg/dL 10.5  9.5  10.1   Total Protein 6.5 - 8.1 g/dL 7.1  6.8  6.9   Total Bilirubin 0.3 - 1.2 mg/dL 0.4  0.4  0.4   Alkaline Phos 38 - 126 U/L 59  59  64   AST 15 - 41 U/L 18  16  15    ALT 0 - 44 U/L 21  17  15     ASSESSMENT & PLAN [place] is a 72 y.o. male returns for a follow up for iron deficiency anemia.   #Normocytic anemia: #Iron deficiency anemia: --Etiology unknown. Underwent EGD and colonoscopy on 06/18/2022. EGD showed single mucosal nodule in GE  junction that was negative for dysplasia or malignancy. Otherwise normal EGD.  Colonoscopy showed several polyps that were negative for dysplasia or malignancy, moderate diverticulosis without evidence of bleeding, non-bleeding external and internal hemorrhoids.  --Currently on PO iron daily without any toxicities. --Labs from today show persistent anemia with Hgb 11.4, MCV 30.8. Iron panel shows saturation at 14%, ferritin 36.  --We will try IV iron to help bolster his iron levels.  --RTC in 6 weeks with labs and 12 weeks with labs/follow up  #Thrombocytopenia: --Etiology unknown but possible immune mediated process since immature platelet fraction was elevated on 03/25/2022. Remaining workup was negative for vitamin B12 and folate deficiencies or hepatitis B/C. --Platelet counts are back to normal --No further workup required at this time.   No orders of the defined types were placed in this encounter.   All questions were answered. The patient knows to call the clinic with any problems, questions or concerns.  I have spent a total of 30 minutes minutes of face-to-face and non-face-to-face time, preparing to see the patient, performing a medically appropriate examination, counseling and educating the patient, ordering meds,  documenting clinical information in the electronic health record, and care coordination.   Dede Query, PA-C Department of Hematology/Oncology [place] [facility] Phone: [phone]

## 2022-06-26 ENCOUNTER — Encounter: Payer: Self-pay | Admitting: Physician Assistant

## 2022-06-26 ENCOUNTER — Telehealth: Payer: Self-pay

## 2022-06-26 ENCOUNTER — Telehealth: Payer: Self-pay | Admitting: Hematology and Oncology

## 2022-06-26 DIAGNOSIS — D509 Iron deficiency anemia, unspecified: Secondary | ICD-10-CM | POA: Insufficient documentation

## 2022-06-26 NOTE — Telephone Encounter (Signed)
Pt advised with VU and agreed to plan. 

## 2022-06-26 NOTE — Telephone Encounter (Signed)
-----   Message from Lincoln Brigham, PA-C sent at 06/26/2022  9:18 AM EDT ----- Please notify patient that iron levels are borderline low so we will try IV iron to bolster levels. We will see him back in 6 weeks for labs and 12 weeks for labs/follow up.

## 2022-06-26 NOTE — Telephone Encounter (Signed)
Reached out to schedule patient p er 4/3 LOS. Patient aware of time and dates of appointment, patient going out of town offered next week patient requested the following Monday.

## 2022-07-01 NOTE — Progress Notes (Signed)
Provider plans for one dose of Feraheme.  Anola Gurney Sobieski, [place], BCPS, BCOP 07/01/2022 2:58 PM

## 2022-07-04 ENCOUNTER — Other Ambulatory Visit: Payer: Self-pay

## 2022-07-04 MED ORDER — PANTOPRAZOLE SODIUM 40 MG PO TBEC: 40.0000 mg | DELAYED_RELEASE_TABLET | Freq: Two times a day (BID) | ORAL | 5 refills | Status: DC

## 2022-07-08 ENCOUNTER — Inpatient Hospital Stay: Payer: Medicare Other

## 2022-07-08 VITALS — BP 110/57 | HR 66 | Temp 98.0°F | Resp 18

## 2022-07-08 DIAGNOSIS — D508 Other iron deficiency anemias: Secondary | ICD-10-CM

## 2022-07-08 DIAGNOSIS — D509 Iron deficiency anemia, unspecified: Secondary | ICD-10-CM | POA: Diagnosis not present

## 2022-07-08 DIAGNOSIS — Z79899 Other long term (current) drug therapy: Secondary | ICD-10-CM | POA: Diagnosis not present

## 2022-07-08 MED ORDER — SODIUM CHLORIDE 0.9 % IV SOLN
510.0000 mg | Freq: Once | INTRAVENOUS | Status: AC
  Administered 2022-07-08: 510 mg via INTRAVENOUS
  Filled 2022-07-08: qty 510

## 2022-07-08 MED ORDER — SODIUM CHLORIDE 0.9 % IV SOLN: Freq: Once | INTRAVENOUS | Status: AC

## 2022-07-08 NOTE — Patient Instructions (Signed)

## 2022-07-21 ENCOUNTER — Ambulatory Visit (INDEPENDENT_AMBULATORY_CARE_PROVIDER_SITE_OTHER): Payer: Medicare Other | Admitting: *Deleted

## 2022-07-21 DIAGNOSIS — E538 Deficiency of other specified B group vitamins: Secondary | ICD-10-CM

## 2022-07-21 MED ORDER — CYANOCOBALAMIN 1000 MCG/ML IJ SOLN
1000.0000 ug | Freq: Once | INTRAMUSCULAR | Status: AC
Start: 2022-07-21 — End: 2022-07-21
  Administered 2022-07-21: 1000 ug via INTRAMUSCULAR

## 2022-07-21 NOTE — Progress Notes (Signed)
Pls cosign for B12 inj../lmb  

## 2022-07-31 ENCOUNTER — Other Ambulatory Visit: Payer: Self-pay | Admitting: Cardiology

## 2022-07-31 DIAGNOSIS — E7800 Pure hypercholesterolemia, unspecified: Secondary | ICD-10-CM

## 2022-07-31 DIAGNOSIS — R931 Abnormal findings on diagnostic imaging of heart and coronary circulation: Secondary | ICD-10-CM

## 2022-08-04 ENCOUNTER — Other Ambulatory Visit: Payer: Self-pay | Admitting: *Deleted

## 2022-08-04 ENCOUNTER — Telehealth: Payer: Self-pay | Admitting: *Deleted

## 2022-08-04 DIAGNOSIS — K229 Disease of esophagus, unspecified: Secondary | ICD-10-CM

## 2022-08-04 NOTE — Telephone Encounter (Signed)
Needs hospital EGD in Aug      Put hospital orders in for EGD on 8/19 at 9:15 am  PreOp is on 7/31 at 11 am   Left detailed message for patient about his appointments

## 2022-08-07 ENCOUNTER — Inpatient Hospital Stay: Payer: Medicare Other | Attending: Physician Assistant

## 2022-08-07 DIAGNOSIS — D696 Thrombocytopenia, unspecified: Secondary | ICD-10-CM | POA: Insufficient documentation

## 2022-08-07 DIAGNOSIS — E538 Deficiency of other specified B group vitamins: Secondary | ICD-10-CM

## 2022-08-07 DIAGNOSIS — D509 Iron deficiency anemia, unspecified: Secondary | ICD-10-CM | POA: Insufficient documentation

## 2022-08-07 DIAGNOSIS — D649 Anemia, unspecified: Secondary | ICD-10-CM

## 2022-08-07 DIAGNOSIS — E1165 Type 2 diabetes mellitus with hyperglycemia: Secondary | ICD-10-CM

## 2022-08-07 DIAGNOSIS — D508 Other iron deficiency anemias: Secondary | ICD-10-CM

## 2022-08-07 DIAGNOSIS — I10 Essential (primary) hypertension: Secondary | ICD-10-CM

## 2022-08-07 DIAGNOSIS — E782 Mixed hyperlipidemia: Secondary | ICD-10-CM

## 2022-08-07 LAB — CBC WITH DIFFERENTIAL (CANCER CENTER ONLY)
Abs Immature Granulocytes: 0.02 10*3/uL (ref 0.00–0.07)
Basophils Absolute: 0 10*3/uL (ref 0.0–0.1)
Basophils Relative: 1 %
Eosinophils Absolute: 0.1 10*3/uL (ref 0.0–0.5)
Eosinophils Relative: 2 %
HCT: 32.7 % — ABNORMAL LOW (ref 39.0–52.0)
Hemoglobin: 11 g/dL — ABNORMAL LOW (ref 13.0–17.0)
Immature Granulocytes: 0 %
Lymphocytes Relative: 26 %
Lymphs Abs: 1.4 10*3/uL (ref 0.7–4.0)
MCH: 30.1 pg (ref 26.0–34.0)
MCHC: 33.6 g/dL (ref 30.0–36.0)
MCV: 89.6 fL (ref 80.0–100.0)
Monocytes Absolute: 0.4 10*3/uL (ref 0.1–1.0)
Monocytes Relative: 7 %
Neutro Abs: 3.5 10*3/uL (ref 1.7–7.7)
Neutrophils Relative %: 64 %
Platelet Count: 145 10*3/uL — ABNORMAL LOW (ref 150–400)
RBC: 3.65 MIL/uL — ABNORMAL LOW (ref 4.22–5.81)
RDW: 14.4 % (ref 11.5–15.5)
WBC Count: 5.4 10*3/uL (ref 4.0–10.5)
nRBC: 0 % (ref 0.0–0.2)

## 2022-08-07 LAB — FERRITIN: Ferritin: 115 ng/mL (ref 24–336)

## 2022-08-07 LAB — IRON AND IRON BINDING CAPACITY (CC-WL,HP ONLY)
Iron: 66 ug/dL (ref 45–182)
Saturation Ratios: 17 % — ABNORMAL LOW (ref 17.9–39.5)
TIBC: 381 ug/dL (ref 250–450)
UIBC: 315 ug/dL (ref 117–376)

## 2022-08-13 ENCOUNTER — Other Ambulatory Visit: Payer: Self-pay | Admitting: Physician Assistant

## 2022-08-20 ENCOUNTER — Other Ambulatory Visit: Payer: Self-pay | Admitting: Internal Medicine

## 2022-08-20 ENCOUNTER — Telehealth: Payer: Self-pay

## 2022-08-20 NOTE — Telephone Encounter (Signed)
-----   Message from Briant Cedar, PA-C sent at 08/20/2022  9:50 AM EDT ----- Please notify patient that iron levels have improved. We will monitor for the next few weeks to see if Hgb levels will improve next.    ----- Message ----- From: Interface, Lab In [facility] Sent: 08/07/2022   9:07 AM EDT To: Briant Cedar, PA-C

## 2022-08-20 NOTE — Telephone Encounter (Signed)
LM for pt with lab results.   ?

## 2022-08-21 ENCOUNTER — Ambulatory Visit (INDEPENDENT_AMBULATORY_CARE_PROVIDER_SITE_OTHER): Payer: Medicare Other | Admitting: *Deleted

## 2022-08-21 DIAGNOSIS — E538 Deficiency of other specified B group vitamins: Secondary | ICD-10-CM

## 2022-08-21 MED ORDER — CYANOCOBALAMIN 1000 MCG/ML IJ SOLN
1000.0000 ug | Freq: Once | INTRAMUSCULAR | Status: AC
Start: 2022-08-21 — End: 2022-08-21
  Administered 2022-08-21: 1000 ug via INTRAMUSCULAR

## 2022-08-21 NOTE — Progress Notes (Signed)
Pls cosign for B12 inj../lmb  

## 2022-08-29 ENCOUNTER — Other Ambulatory Visit: Payer: Self-pay | Admitting: Thoracic Surgery (Cardiothoracic Vascular Surgery)

## 2022-08-29 ENCOUNTER — Telehealth: Payer: Medicare Other | Admitting: Thoracic Surgery (Cardiothoracic Vascular Surgery)

## 2022-08-29 DIAGNOSIS — R911 Solitary pulmonary nodule: Secondary | ICD-10-CM

## 2022-09-18 ENCOUNTER — Inpatient Hospital Stay (HOSPITAL_BASED_OUTPATIENT_CLINIC_OR_DEPARTMENT_OTHER): Payer: Medicare Other | Admitting: Hematology and Oncology

## 2022-09-18 ENCOUNTER — Inpatient Hospital Stay: Payer: Medicare Other | Attending: Physician Assistant

## 2022-09-18 ENCOUNTER — Other Ambulatory Visit: Payer: Self-pay

## 2022-09-18 VITALS — BP 116/70 | HR 73 | Temp 97.0°F | Resp 13 | Wt 215.1 lb

## 2022-09-18 DIAGNOSIS — D508 Other iron deficiency anemias: Secondary | ICD-10-CM

## 2022-09-18 DIAGNOSIS — D649 Anemia, unspecified: Secondary | ICD-10-CM

## 2022-09-18 DIAGNOSIS — Z79899 Other long term (current) drug therapy: Secondary | ICD-10-CM | POA: Diagnosis not present

## 2022-09-18 DIAGNOSIS — D696 Thrombocytopenia, unspecified: Secondary | ICD-10-CM

## 2022-09-18 DIAGNOSIS — D509 Iron deficiency anemia, unspecified: Secondary | ICD-10-CM | POA: Insufficient documentation

## 2022-09-18 LAB — IRON AND IRON BINDING CAPACITY (CC-WL,HP ONLY)
Iron: 88 ug/dL (ref 45–182)
Saturation Ratios: 22 % (ref 17.9–39.5)
TIBC: 406 ug/dL (ref 250–450)
UIBC: 318 ug/dL (ref 117–376)

## 2022-09-18 LAB — CBC WITH DIFFERENTIAL (CANCER CENTER ONLY)
Abs Immature Granulocytes: 0.02 10*3/uL (ref 0.00–0.07)
Basophils Absolute: 0 10*3/uL (ref 0.0–0.1)
Basophils Relative: 0 %
Eosinophils Absolute: 0.1 10*3/uL (ref 0.0–0.5)
Eosinophils Relative: 2 %
HCT: 34.4 % — ABNORMAL LOW (ref 39.0–52.0)
Hemoglobin: 11.7 g/dL — ABNORMAL LOW (ref 13.0–17.0)
Immature Granulocytes: 0 %
Lymphocytes Relative: 23 %
Lymphs Abs: 1.7 10*3/uL (ref 0.7–4.0)
MCH: 30.8 pg (ref 26.0–34.0)
MCHC: 34 g/dL (ref 30.0–36.0)
MCV: 90.5 fL (ref 80.0–100.0)
Monocytes Absolute: 0.6 10*3/uL (ref 0.1–1.0)
Monocytes Relative: 8 %
Neutro Abs: 5 10*3/uL (ref 1.7–7.7)
Neutrophils Relative %: 67 %
Platelet Count: 168 10*3/uL (ref 150–400)
RBC: 3.8 MIL/uL — ABNORMAL LOW (ref 4.22–5.81)
RDW: 14.2 % (ref 11.5–15.5)
WBC Count: 7.5 10*3/uL (ref 4.0–10.5)
nRBC: 0 % (ref 0.0–0.2)

## 2022-09-18 LAB — FERRITIN: Ferritin: 98 ng/mL (ref 24–336)

## 2022-09-18 NOTE — Progress Notes (Signed)
[facility] [phone]   [phone]  PROGRESS NOTE  Patient Care Team: Pincus Sanes, MD as PCP - General (Internal Medicine) Jens Som Madolyn Frieze, MD as PCP - Cardiology (Cardiology) [place], Heather, OD as Consulting Physician (Optometry) Erlene Quan, Vinnie Level, [facility] (Inactive) (Pharmacist)   CHIEF COMPLAINTS/PURPOSE OF CONSULTATION:  Iron deficiency anemia  HISTORY OF PRESENTING ILLNESS:  Jonathon Armstrong 72 y.o. male returns for a follow up for iron deficiency anemia. He is unaccompanied for this visit.   On exam today, Jonathon Armstrong reports he has been well overall in the interim since her last visit.  He reports his energy levels are "fine".  He notes he has been no major changes in his energy levels though he does take an occasional nap.  He reports that his endurance is quite good when he gets going.  He reports that when he wakes up he is "ready to go".  He notes that he underwent endoscopy and colonoscopy and a source of the bleeding was not clearly identified, though an esophageal nodule was seen.  He reports he has another endoscopy coming up at the end of July/early August 2024.  He has not seen any overt bleeding anywhere.  He denies any blood in the urine, stool, nosebleeds, or gum bleeding.  He notes he does have a tarry and sticky stool on occasion.  He does enjoy eating red meat and is been doing his best to try to keep up with iron rich foods.  He denies any lightheadedness, dizziness, shortness of breath.  Overall he is at his baseline level of health.  He denies fevers, chills, sweats, shortness of breath, chest pain or cough. He has no other complaints. Rest of the 10 point ROS is below.   MEDICAL HISTORY:  Past Medical History:  Diagnosis Date   Allergy    seasonal   Asthma 10-20-2017   Asystole (HCC)    "vagal response"post nasal polypectomy   Complication of anesthesia Oct 20, 2004   Coded x2 after nasal polyp removal (Believes vagal nerve response)    Diabetes mellitus without complication (HCC)    pre but take metformin   Diverticulosis of colon    FH: colonic polyps    hx of   Hyperlipidemia    Hypertension    Mitral regurgitation    mild & aortic Valve calcification on 2d echo;sbe prophylaxis   Parotitis, acute 2011-10-21   Seasonal rhinitis    Sleep apnea    CPAP    SURGICAL HISTORY: Past Surgical History:  Procedure Laterality Date   APPENDECTOMY     asytole post nasal polypectomy  21-Oct-2006   colonoscopy with polypectomy  Oct 20, 2008   Dr Juanda Chance   heriorraphy     x2   HERNIA REPAIR     INTERCOSTAL NERVE BLOCK Left 08/16/2019   Procedure: Intercostal Nerve Block;  Surgeon: Corliss Skains, MD;  Location: [facility] OR;  Service: Thoracic;  Laterality: Left;   MIDDLE EAR SURGERY     TM replacement   NASAL SINUS SURGERY  10/20/1992, [id number],1027   X3   NODE DISSECTION  08/16/2019   Procedure: Node Dissection;  Surgeon: Corliss Skains, MD;  Location: [facility] OR;  Service: Thoracic;;   TONSILLECTOMY AND ADENOIDECTOMY     VIDEO BRONCHOSCOPY N/A 08/16/2019   Procedure: VIDEO BRONCHOSCOPY;  Surgeon: Corliss Skains, MD;  Location: [facility] OR;  Service: Thoracic;  Laterality: N/A;    SOCIAL HISTORY: Social History   Socioeconomic History   Marital  status: Married    Spouse name: Not on file   Number of children: 2   Years of education: 43   Highest education level: Not on file  Occupational History   Occupation: Retired    Associate Professor: RETIRED  Tobacco Use   Smoking status: Never   Smokeless tobacco: Never  Vaping Use   Vaping Use: Never used  Substance and Sexual Activity   Alcohol use: Yes    Alcohol/week: 2.0 standard drinks of alcohol    Types: 1 Glasses of wine, 1 Shots of liquor per week    Comment: socially < 2/ week   Drug use: No   Sexual activity: Yes  Other Topics Concern   Not on file  Social History Narrative   Fun: Water, swimming, [employer]; Volunteer work    Denies abuse and feels safe at home.   Regular exercise       Social Determinants of Health   Financial Resource Strain: Low Risk  (05/29/2021)   Overall Financial Resource Strain (CARDIA)    Difficulty of Paying Living Expenses: Not hard at all  Food Insecurity: No Food Insecurity (05/29/2021)   Hunger Vital Sign    Worried About Running Out of Food in the Last Year: Never true    Ran Out of Food in the Last Year: Never true  Transportation Needs: No Transportation Needs (05/29/2021)   PRAPARE - Administrator, Civil Service (Medical): No    Lack of Transportation (Non-Medical): No  Physical Activity: Sufficiently Active (05/29/2021)   Exercise Vital Sign    Days of Exercise per Week: 5 days    Minutes of Exercise per Session: 30 min  Stress: No Stress Concern Present (05/29/2021)   [employer] of Occupational Health - Occupational Stress Questionnaire    Feeling of Stress : Not at all  Social Connections: Socially Integrated (05/29/2021)   Social Connection and Isolation Panel [NHANES]    Frequency of Communication with Friends and Family: More than three times a week    Frequency of Social Gatherings with Friends and Family: More than three times a week    Attends Religious Services: More than 4 times per year    Active Member of [employer] or Organizations: Yes    Attends Engineer, structural: More than 4 times per year    Marital Status: Married  Catering manager Violence: Not At Risk (05/29/2021)   Humiliation, Afraid, Rape, and Kick questionnaire    Fear of Current or Ex-Partner: No    Emotionally Abused: No    Physically Abused: No    Sexually Abused: No    FAMILY HISTORY: Family History  Problem Relation Age of Onset   Cancer Mother        liver &multiple myeloma   Heart disease Mother        CABG, Angina   Hyperlipidemia Mother    Hypertension Father    Heart disease Father        MI in 26"s   Dementia Father    COPD Sister    Other Sister        perforated bowel   Other Sister        cns aneurysm    Cancer Maternal Grandfather        bone   Diabetes Neg Hx    Stroke Neg Hx    Colon cancer Neg Hx     ALLERGIES:  is allergic to diltiazem hcl, diltiazem hcl, and penicillins.  MEDICATIONS:  Current Outpatient Medications  Medication Sig Dispense Refill   pantoprazole (PROTONIX) 40 MG tablet Take 1 tablet (40 mg total) by mouth 2 (two) times daily before a meal. 60 tablet 5   rosuvastatin (CRESTOR) 40 MG tablet Take 1 tablet (40 mg total) by mouth daily. 90 tablet 1   aspirin EC 81 MG tablet Take 1 tablet (81 mg total) by mouth daily. Swallow whole. 90 tablet 3   Cholecalciferol (VITAMIN D-3 PO) Take by mouth daily.     Cyanocobalamin (VITAMIN B-12 IJ) Inject 1,000 mcg as directed every 30 (thirty) days.     cyanocobalamin (VITAMIN B12) 1000 MCG tablet Take 1,000 mcg by mouth daily.     FEROSUL 325 (65 Fe) MG tablet Take 325 mg by mouth every morning.     FEROSUL 325 (65 Fe) MG tablet TAKE ONE TABLET BY MOUTH DAILY WITH BREAKFAST 30 tablet 3   icosapent Ethyl (VASCEPA) 1 g capsule Take 2 capsules (2 g total) by mouth 2 (two) times daily. (Patient taking differently: Take 2 g by mouth 2 (two) times daily. Fish Oil) 120 capsule 5   JARDIANCE 25 MG TABS tablet Take 1 tablet (25 mg total) by mouth daily before breakfast. 90 tablet 1   labetalol (NORMODYNE) 200 MG tablet Take 1 tablet (200 mg total) by mouth 2 (two) times daily. 180 tablet 0   metFORMIN (GLUCOPHAGE) 500 MG tablet TAKE TWO TABLETS BY MOUTH TWICE DAILY WITH FOOD 360 tablet 1   RYBELSUS 7 MG TABS TAKE 1 TABLET(7 MG) BY MOUTH EVERY DAY 30 tablet 5   spironolactone (ALDACTONE) 25 MG tablet TAKE ONE TABLET ONCE DAILY 90 tablet 1   telmisartan (MICARDIS) 80 MG tablet TAKE ONE TABLET ONCE DAILY 90 tablet 1   No current facility-administered medications for this visit.    REVIEW OF SYSTEMS:   Constitutional: ( - ) fevers, ( - )  chills , ( - ) night sweats Eyes: ( - ) blurriness of vision, ( - ) double vision, ( - ) watery  eyes Ears, nose, mouth, throat, and face: ( - ) mucositis, ( - ) sore throat Respiratory: ( - ) cough, ( - ) dyspnea, ( - ) wheezes Cardiovascular: ( - ) palpitation, ( - ) chest discomfort, ( - ) lower extremity swelling Gastrointestinal:  ( - ) nausea, ( - ) heartburn, ( - ) change in bowel habits Skin: ( - ) abnormal skin rashes Lymphatics: ( - ) new lymphadenopathy, ( - ) easy bruising Neurological: ( - ) numbness, ( - ) tingling, ( - ) new weaknesses Behavioral/Psych: ( - ) mood change, ( - ) new changes  All other systems were reviewed with the patient and are negative.  PHYSICAL EXAMINATION: ECOG PERFORMANCE STATUS: 0 - Asymptomatic  Vitals:   09/18/22 0938  BP: 116/70  Pulse: 73  Resp: 13  Temp: (!) 97 F (36.1 C)  SpO2: 97%    Filed Weights   09/18/22 0938  Weight: 215 lb 1.6 oz (97.6 kg)     GENERAL: well appearing male in NAD  SKIN: skin color, texture, turgor are normal, no rashes or significant lesions EYES: conjunctiva are pink and non-injected, sclera clear LUNGS: clear to auscultation and percussion with normal breathing effort HEART: regular rate & rhythm and no murmurs and no lower extremity edema Musculoskeletal: no cyanosis of digits and no clubbing  PSYCH: alert & oriented x 3, fluent speech NEURO: no focal motor/sensory deficits  LABORATORY DATA:  I have  reviewed the data as listed    Latest Ref Rng & Units 09/18/2022    9:25 AM 08/07/2022    8:53 AM 06/25/2022   10:00 AM  CBC  WBC 4.0 - 10.5 K/uL 7.5  5.4  6.7   Hemoglobin 13.0 - 17.0 g/dL 16.1  09.6  04.5   Hematocrit 39.0 - 52.0 % 34.4  32.7  32.2   Platelets 150 - 400 K/uL 168  145  168        Latest Ref Rng & Units 06/25/2022   10:00 AM 03/25/2022   12:43 PM 02/25/2022    7:56 AM  CMP  Glucose 70 - 99 mg/dL 409  811  914   BUN 8 - 23 mg/dL 22  18  20    Creatinine 0.61 - 1.24 mg/dL 7.82  9.56  2.13   Sodium 135 - 145 mmol/L 140  140  141   Potassium 3.5 - 5.1 mmol/L 4.3  4.2  4.3    Chloride 98 - 111 mmol/L 107  106  108   CO2 22 - 32 mmol/L 23  25  24    Calcium 8.9 - 10.3 mg/dL 08.6  57.8  9.5   Total Protein 6.5 - 8.1 g/dL 7.0  7.1  6.8   Total Bilirubin 0.3 - 1.2 mg/dL 0.5  0.4  0.4   Alkaline Phos 38 - 126 U/L 62  59  59   AST 15 - 41 U/L 18  18  16    ALT 0 - 44 U/L 18  21  17     ASSESSMENT & PLAN Jonathon Armstrong is a 72 y.o. male returns for a follow up for iron deficiency anemia.   #Normocytic anemia: #Iron deficiency anemia: --Etiology unknown. Underwent EGD and colonoscopy on 06/18/2022. EGD showed single mucosal nodule in GE junction that was negative for dysplasia or malignancy. Otherwise normal EGD.  Colonoscopy showed several polyps that were negative for dysplasia or malignancy, moderate diverticulosis without evidence of bleeding, non-bleeding external and internal hemorrhoids.  --Currently on PO iron daily without any toxicities. --Labs from today show persistent anemia with Hgb 11.7, WBC 7.5, MCV 90.5, Plt 168 --if iron levels are persistently low, recommend IV iron to help bolster his iron levels.  --RTC in 8 weeks with labs/follow up  #Thrombocytopenia: --Etiology unknown but possible immune mediated process since immature platelet fraction was elevated on 03/25/2022. Remaining workup was negative for vitamin B12 and folate deficiencies or hepatitis B/C. --Platelet counts are back to normal --No further workup required at this time.   No orders of the defined types were placed in this encounter.   All questions were answered. The patient knows to call the clinic with any problems, questions or concerns.  I have spent a total of 30 minutes minutes of face-to-face and non-face-to-face time, preparing to see the patient, performing a medically appropriate examination, counseling and educating the patient, ordering meds,  documenting clinical information in the electronic health record, and care coordination.   Jonathon Barns, MD Department of  Hematology/Oncology [facility] Phone: [phone] Pager: [phone] Email: [email or web address]

## 2022-09-19 ENCOUNTER — Telehealth: Payer: Self-pay | Admitting: Hematology and Oncology

## 2022-09-22 ENCOUNTER — Ambulatory Visit (INDEPENDENT_AMBULATORY_CARE_PROVIDER_SITE_OTHER): Payer: Medicare Other

## 2022-09-22 DIAGNOSIS — E538 Deficiency of other specified B group vitamins: Secondary | ICD-10-CM | POA: Diagnosis not present

## 2022-09-22 MED ORDER — CYANOCOBALAMIN 1000 MCG/ML IJ SOLN
1000.0000 ug | Freq: Once | INTRAMUSCULAR | Status: AC
Start: 2022-09-22 — End: 2022-09-22
  Administered 2022-09-22: 1000 ug via INTRAMUSCULAR

## 2022-09-22 NOTE — Progress Notes (Signed)
Pt here for monthly B12 injection per   B12 1000mcg given IM and pt tolerated injection well.   

## 2022-09-25 ENCOUNTER — Encounter: Payer: Self-pay | Admitting: Physician Assistant

## 2022-10-10 ENCOUNTER — Telehealth: Payer: Medicare Other | Admitting: Thoracic Surgery (Cardiothoracic Vascular Surgery)

## 2022-10-10 ENCOUNTER — Ambulatory Visit
Admission: RE | Admit: 2022-10-10 | Discharge: 2022-10-10 | Disposition: A | Discharge status: Home / Self Care | Payer: Medicare Other | Source: Ambulatory Visit | Attending: Thoracic Surgery (Cardiothoracic Vascular Surgery) | Admitting: Thoracic Surgery (Cardiothoracic Vascular Surgery)

## 2022-10-10 ENCOUNTER — Ambulatory Visit (INDEPENDENT_AMBULATORY_CARE_PROVIDER_SITE_OTHER): Payer: Medicare Other | Admitting: Thoracic Surgery (Cardiothoracic Vascular Surgery)

## 2022-10-10 DIAGNOSIS — J90 Pleural effusion, not elsewhere classified: Secondary | ICD-10-CM | POA: Diagnosis not present

## 2022-10-10 DIAGNOSIS — Z08 Encounter for follow-up examination after completed treatment for malignant neoplasm: Secondary | ICD-10-CM

## 2022-10-10 DIAGNOSIS — Z902 Acquired absence of lung [part of]: Secondary | ICD-10-CM | POA: Diagnosis not present

## 2022-10-10 DIAGNOSIS — Z85118 Personal history of other malignant neoplasm of bronchus and lung: Secondary | ICD-10-CM | POA: Diagnosis not present

## 2022-10-10 DIAGNOSIS — R911 Solitary pulmonary nodule: Secondary | ICD-10-CM

## 2022-10-10 NOTE — Progress Notes (Signed)
     [address]       [place] [address]             [phone]        Patient: Home Provider: Office Consent for Telemedicine visit obtained.   Today's visit was completed via a real-time telehealth (see specific modality noted below). The patient/authorized person provided oral consent at the time of the visit to engage in a telemedicine encounter with the present provider at [facility] The patient/authorized person was informed of the potential benefits, limitations, and risks of telemedicine. The patient/authorized person expressed understanding that the laws that protect confidentiality also apply to telemedicine. The patient/authorized person acknowledged understanding that telemedicine does not provide emergency services and that he or she would need to call 911 or proceed to the nearest hospital for help if such a need arose.   Total time spent in the clinical discussion 10 minutes.  Telehealth Modality: Phone visit (audio only)  I had a telephone visit with Mr. Heritage.  Overall he is doing well.  He is celebrating his 50th wedding anniversary today.  He denies any shortness of breath.  I personally reviewed his CT scan from last month.  There is no evidence of any recurrent or new disease.  Given that his surgery was in May 2021 we will with his surveillance to annual scans.  I will see him back in 1 year.

## 2022-10-20 ENCOUNTER — Other Ambulatory Visit: Payer: Self-pay | Admitting: Internal Medicine

## 2022-10-22 ENCOUNTER — Ambulatory Visit (AMBULATORY_SURGERY_CENTER): Payer: Medicare Other

## 2022-10-22 VITALS — Ht 70.0 in | Wt 216.4 lb

## 2022-10-22 DIAGNOSIS — K229 Disease of esophagus, unspecified: Secondary | ICD-10-CM

## 2022-10-22 NOTE — Progress Notes (Signed)
No egg or soy allergy known to patient  No issues known to pt with past sedation with any surgeries or procedures Patient denies ever being told they had issues or difficulty with intubation  No FH of Malignant Hyperthermia Pt is not on diet pills Pt is not on  home 02  Pt is not on blood thinners  Pt denies issues with constipation  No A fib or A flutter Have any cardiac testing pending--no LOA: independent    Patient's chart reviewed by Cathlyn Parsons CNRA prior to previsit and patient appropriate for the [facility].  Previsit completed and red dot placed by patient's name on their procedure day (on provider's schedule).     PV competed with patient. Prep instructions sent via mychart and hard copy given at visit

## 2022-10-23 ENCOUNTER — Ambulatory Visit (INDEPENDENT_AMBULATORY_CARE_PROVIDER_SITE_OTHER): Payer: Medicare Other | Admitting: *Deleted

## 2022-10-23 DIAGNOSIS — E538 Deficiency of other specified B group vitamins: Secondary | ICD-10-CM | POA: Diagnosis not present

## 2022-10-23 MED ORDER — CYANOCOBALAMIN 1000 MCG/ML IJ SOLN
1000.0000 ug | Freq: Once | INTRAMUSCULAR | Status: AC
Start: 2022-10-23 — End: 2022-10-23
  Administered 2022-10-23: 1000 ug via INTRAMUSCULAR

## 2022-10-23 NOTE — Progress Notes (Signed)
Pls cosign for B12 inj../lmb  

## 2022-11-03 ENCOUNTER — Other Ambulatory Visit: Payer: Self-pay | Admitting: Student

## 2022-11-03 ENCOUNTER — Other Ambulatory Visit: Payer: Self-pay | Admitting: Cardiology

## 2022-11-03 ENCOUNTER — Encounter (HOSPITAL_COMMUNITY): Payer: Self-pay | Admitting: Gastroenterology

## 2022-11-03 DIAGNOSIS — I10 Essential (primary) hypertension: Secondary | ICD-10-CM

## 2022-11-03 NOTE — Progress Notes (Signed)
Attempted to obtain medical history via telephone, unable to reach at this time. HIPAA compliant voicemail message left requesting return call to pre surgical testing department. 

## 2022-11-03 NOTE — Progress Notes (Signed)
Case: [id number] Date/Time: 11/10/22 0915   Procedure: ESOPHAGOGASTRODUODENOSCOPY (EGD) WITH PROPOFOL   Anesthesia type: Monitor Anesthesia Care   Pre-op diagnosis: Esophageal nodule   [facility] / [facility]   Surgeons: Napoleon Form, MD       DISCUSSION: Jonathon Armstrong is a 72 year old male who is being evaluated prior to procedure above. PMH significant for HTN, HLD, CAD (seen on CT scan), mild mitral regurg, DM, OSA (uses CPAP), asthma, lung cancer s/p lobectomy (in Nov 15, 2019).  Prior complication from anesthesia includes episode of cardiac arrest after a ENT surgery in 15-Nov-2006. Per Cardiology notes: "Tolerated the surgery well, but during recovery from anesthesia while still in the operating room, his heart rate dropped into the 30s and he was treated with ephedrine and then went asystolic.  The asystole lasted 6 seconds by report.  CPR was begun, and sinus bradycardia was reestablished with atropine.  He had another episode of asystole, according to reports, and received atropine again to which he responded."  It was felt that it was most likely a vagal reaction.  An echocardiogram, stress test, and Holter monitoring was done which all came back reassuring. Patient last saw Cardiology in April 2023 and was doing well from a cardiac perspective. Advised to f/u in 1 year.  He has not had any subsequent anesthesia reactions.  VS:     10/22/2022   11:04 AM 09/18/2022    9:38 AM 07/08/2022    8:51 AM  Vitals with BMI  Height 5\' 10"     Weight 216 lbs 6 oz 215 lbs 2 oz   BMI 31.05    Systolic  116 110  Diastolic  70 57  Pulse  73 66     PROVIDERS: [facility], Bobette Mo, MD   LABS: n/a (all labs ordered are listed, but only abnormal results are displayed)  Labs Reviewed - No data to display   IMAGES:  CT Chest 10/10/22:  IMPRESSION: 1. Status post left lower lobectomy. No evidence of recurrent or metastatic disease in the chest. 2. Unchanged trace right pleural  effusion. 3. Coronary artery disease.   Aortic Atherosclerosis (ICD10-I70.0).     EKG:   CV:  NM Stress test 04/21/2019:  The left ventricular ejection fraction is normal (55-65%). Nuclear stress EF: 58%. There was no ST segment deviation noted during stress. No T wave inversion was noted during stress. The study is normal. This is a low risk study.   Low risk stress nuclear study with normal perfusion and normal left ventricular regional and global systolic function.  CT calcium score 03/31/2019:  IMPRESSION: Coronary calcium score of 1368. This was 90th percentile for age and sex matched control.   Recommend aggressive risk factor modification.   Echo 03/29/2008:   SUMMARY   -  Overall left ventricular systolic function was normal. Left         ventricular ejection fraction was estimated to be 60 %. There         were no left ventricular regional wall motion abnormalities.   -  The aortic valve was mildly calcified.   -  There was mild mitral valvular regurgitation.   Past Medical History:  Diagnosis Date   Allergy    seasonal   Asthma Nov 14, 2017   Asystole (HCC)    "vagal response"post nasal polypectomy   Complication of anesthesia 14-Nov-2004   Coded x2 after nasal polyp removal (Believes vagal nerve response)   Diabetes mellitus without complication (HCC)  pre but take metformin   Diverticulosis of colon    FH: colonic polyps    hx of   Hyperlipidemia    Hypertension    Mitral regurgitation    mild & aortic Valve calcification on 2d echo;sbe prophylaxis   Parotitis, acute 2013   Seasonal rhinitis    Sleep apnea    CPAP    Past Surgical History:  Procedure Laterality Date   APPENDECTOMY     asytole post nasal polypectomy  2008   colonoscopy with polypectomy  2010   Dr Juanda Chance   heriorraphy     x2   HERNIA REPAIR     INTERCOSTAL NERVE BLOCK Left 08/16/2019   Procedure: Intercostal Nerve Block;  Surgeon: Corliss Skains, MD;  Location: [facility];  Service:  Thoracic;  Laterality: Left;   MIDDLE EAR SURGERY     TM replacement   NASAL SINUS SURGERY  1994, 5366,4403   X3   NODE DISSECTION  08/16/2019   Procedure: Node Dissection;  Surgeon: Corliss Skains, MD;  Location: [facility];  Service: Thoracic;;   TONSILLECTOMY AND ADENOIDECTOMY     VIDEO BRONCHOSCOPY N/A 08/16/2019   Procedure: VIDEO BRONCHOSCOPY;  Surgeon: Corliss Skains, MD;  Location: [facility] OR;  Service: Thoracic;  Laterality: N/A;    MEDICATIONS: No current facility-administered medications for this encounter.    aspirin EC 81 MG tablet   Cholecalciferol (VITAMIN D-3 PO)   Cyanocobalamin (VITAMIN B-12 IJ)   cyanocobalamin (VITAMIN B12) 1000 MCG tablet   FEROSUL 325 (65 Fe) MG tablet   FEROSUL 325 (65 Fe) MG tablet   icosapent Ethyl (VASCEPA) 1 g capsule   JARDIANCE 25 MG TABS tablet   labetalol (NORMODYNE) 200 MG tablet   metFORMIN (GLUCOPHAGE) 500 MG tablet   Omega-3 Fatty Acids (FISH OIL PO)   pantoprazole (PROTONIX) 40 MG tablet   rosuvastatin (CRESTOR) 40 MG tablet   Semaglutide (RYBELSUS) 7 MG TABS   spironolactone (ALDACTONE) 25 MG tablet   telmisartan (MICARDIS) 80 MG tablet

## 2022-11-03 NOTE — Progress Notes (Signed)
Prep instructions- n/a  PCP-Stacy Burns MD Cardiologist- Dr Jens Som  CT chest- 10/10/2022 EKG-06/26/21 Echo-2010 Cath-n/a Stress- 04/21/19 ICD/PM-n/a Blood thinner- n/a GLP-1- n/a  Hx: HTN, Asthma, Mitral regurg, L Lung CA- s/p lower lobectomy,DM,OSA. Last office visit with cards was 06/26/21 with a yearly f/u which is scheduled for october. Patient endorses no new cardiac/breathing issues at this time. Last saw Dr Cliffton Asters who performed the lobectomy in mid 2021 for yearly check on 10/10/22. Patient did have an issue with anesthesia back in 2006/7 where he had a nasal polyp removal and ended up coding, they believe due to vagal response. Since then has had anesthesia and done well no issues.    Anesthesia Review: Yes

## 2022-11-03 NOTE — Anesthesia Preprocedure Evaluation (Addendum)
Anesthesia Evaluation  Patient identified by MRN, date of birth, ID band Patient awake    Reviewed: Allergy & Precautions, NPO status , Patient's Chart, lab work & pertinent test results  Airway Mallampati: IV   Neck ROM: Full    Dental  (+) Teeth Intact, Dental Advisory Given   Pulmonary    breath sounds clear to auscultation       Cardiovascular hypertension, Pt. on medications  Rhythm:Regular Rate:Normal     Neuro/Psych    GI/Hepatic   Endo/Other  diabetes    Renal/GU      Musculoskeletal   Abdominal   Peds  Hematology   Anesthesia Other Findings   Reproductive/Obstetrics                             Anesthesia Physical Anesthesia Plan  ASA: 3  Anesthesia Plan: MAC   Post-op Pain Management: Minimal or no pain anticipated   Induction: Intravenous  PONV Risk Score and Plan: 1 and Ondansetron, Dexamethasone, TIVA, Propofol infusion and Treatment may vary due to age or medical condition  Airway Management Planned: Natural Airway  Additional Equipment:   Intra-op Plan:   Post-operative Plan:   Informed Consent: I have reviewed the patients History and Physical, chart, labs and discussed the procedure including the risks, benefits and alternatives for the proposed anesthesia with the patient or authorized representative who has indicated his/her understanding and acceptance.     Dental advisory given  Plan Discussed with: CRNA  Anesthesia Plan Comments: (See PAT note from 8/12 by K Gekas PA-C )        Anesthesia Quick Evaluation

## 2022-11-06 ENCOUNTER — Encounter (INDEPENDENT_AMBULATORY_CARE_PROVIDER_SITE_OTHER): Payer: Self-pay

## 2022-11-10 ENCOUNTER — Ambulatory Visit (HOSPITAL_COMMUNITY)
Admission: RE | Admit: 2022-11-10 | Discharge: 2022-11-10 | Disposition: A | Discharge status: Home / Self Care | Payer: Medicare Other | Source: Ambulatory Visit | Attending: Gastroenterology | Admitting: Gastroenterology

## 2022-11-10 ENCOUNTER — Encounter (HOSPITAL_COMMUNITY): Payer: Self-pay | Admitting: Gastroenterology

## 2022-11-10 ENCOUNTER — Other Ambulatory Visit: Payer: Self-pay

## 2022-11-10 ENCOUNTER — Encounter (HOSPITAL_COMMUNITY)
Admission: RE | Disposition: A | Discharge status: Home / Self Care | Payer: Self-pay | Source: Ambulatory Visit | Attending: Gastroenterology

## 2022-11-10 ENCOUNTER — Ambulatory Visit (HOSPITAL_COMMUNITY): Payer: Medicare Other | Admitting: Medical

## 2022-11-10 DIAGNOSIS — K229 Disease of esophagus, unspecified: Secondary | ICD-10-CM

## 2022-11-10 DIAGNOSIS — Z7984 Long term (current) use of oral hypoglycemic drugs: Secondary | ICD-10-CM | POA: Insufficient documentation

## 2022-11-10 DIAGNOSIS — K2289 Other specified disease of esophagus: Secondary | ICD-10-CM | POA: Diagnosis not present

## 2022-11-10 DIAGNOSIS — K2282 Esophagogastric junction polyp: Secondary | ICD-10-CM

## 2022-11-10 DIAGNOSIS — J90 Pleural effusion, not elsewhere classified: Secondary | ICD-10-CM | POA: Insufficient documentation

## 2022-11-10 DIAGNOSIS — G4733 Obstructive sleep apnea (adult) (pediatric): Secondary | ICD-10-CM | POA: Insufficient documentation

## 2022-11-10 DIAGNOSIS — Z902 Acquired absence of lung [part of]: Secondary | ICD-10-CM | POA: Insufficient documentation

## 2022-11-10 DIAGNOSIS — E785 Hyperlipidemia, unspecified: Secondary | ICD-10-CM

## 2022-11-10 DIAGNOSIS — I700 Atherosclerosis of aorta: Secondary | ICD-10-CM | POA: Insufficient documentation

## 2022-11-10 DIAGNOSIS — Z79899 Other long term (current) drug therapy: Secondary | ICD-10-CM | POA: Diagnosis not present

## 2022-11-10 DIAGNOSIS — K209 Esophagitis, unspecified without bleeding: Secondary | ICD-10-CM | POA: Diagnosis not present

## 2022-11-10 DIAGNOSIS — K2281 Esophageal polyp: Secondary | ICD-10-CM | POA: Diagnosis not present

## 2022-11-10 DIAGNOSIS — K219 Gastro-esophageal reflux disease without esophagitis: Secondary | ICD-10-CM

## 2022-11-10 DIAGNOSIS — J45909 Unspecified asthma, uncomplicated: Secondary | ICD-10-CM | POA: Diagnosis not present

## 2022-11-10 DIAGNOSIS — K2100 Gastro-esophageal reflux disease with esophagitis, without bleeding: Secondary | ICD-10-CM | POA: Insufficient documentation

## 2022-11-10 DIAGNOSIS — Z8674 Personal history of sudden cardiac arrest: Secondary | ICD-10-CM | POA: Diagnosis not present

## 2022-11-10 DIAGNOSIS — I10 Essential (primary) hypertension: Secondary | ICD-10-CM | POA: Diagnosis not present

## 2022-11-10 DIAGNOSIS — Z8249 Family history of ischemic heart disease and other diseases of the circulatory system: Secondary | ICD-10-CM | POA: Insufficient documentation

## 2022-11-10 DIAGNOSIS — I2510 Atherosclerotic heart disease of native coronary artery without angina pectoris: Secondary | ICD-10-CM | POA: Diagnosis not present

## 2022-11-10 DIAGNOSIS — K635 Polyp of colon: Secondary | ICD-10-CM | POA: Diagnosis not present

## 2022-11-10 DIAGNOSIS — Z83438 Family history of other disorder of lipoprotein metabolism and other lipidemia: Secondary | ICD-10-CM | POA: Insufficient documentation

## 2022-11-10 DIAGNOSIS — E119 Type 2 diabetes mellitus without complications: Secondary | ICD-10-CM | POA: Insufficient documentation

## 2022-11-10 DIAGNOSIS — Z85118 Personal history of other malignant neoplasm of bronchus and lung: Secondary | ICD-10-CM | POA: Diagnosis not present

## 2022-11-10 HISTORY — PX: POLYPECTOMY: SHX5525

## 2022-11-10 HISTORY — PX: HEMOSTASIS CLIP PLACEMENT: SHX6857

## 2022-11-10 HISTORY — PX: ESOPHAGOGASTRODUODENOSCOPY (EGD) WITH PROPOFOL: SHX5813

## 2022-11-10 LAB — GLUCOSE, CAPILLARY
Glucose-Capillary: 145 mg/dL — ABNORMAL HIGH (ref 70–99)
Glucose-Capillary: 146 mg/dL — ABNORMAL HIGH (ref 70–99)

## 2022-11-10 SURGERY — ESOPHAGOGASTRODUODENOSCOPY (EGD) WITH PROPOFOL
Anesthesia: Monitor Anesthesia Care

## 2022-11-10 MED ORDER — EPHEDRINE SULFATE (PRESSORS) 50 MG/ML IJ SOLN
INTRAMUSCULAR | Status: DC | PRN
  Administered 2022-11-10: 10 mg via INTRAVENOUS
  Administered 2022-11-10: 5 mg via INTRAVENOUS

## 2022-11-10 MED ORDER — LACTATED RINGERS IV SOLN: INTRAVENOUS | Status: DC

## 2022-11-10 MED ORDER — PROPOFOL 500 MG/50ML IV EMUL
INTRAVENOUS | Status: DC | PRN
  Administered 2022-11-10: 110 ug/kg/min via INTRAVENOUS

## 2022-11-10 MED ORDER — ONDANSETRON HCL 4 MG/2ML IJ SOLN
INTRAMUSCULAR | Status: DC | PRN
  Administered 2022-11-10: 4 mg via INTRAVENOUS

## 2022-11-10 MED ORDER — LIDOCAINE HCL (PF) 2 % IJ SOLN
INTRAMUSCULAR | Status: DC | PRN
  Administered 2022-11-10: 100 mg via INTRADERMAL

## 2022-11-10 MED ORDER — SODIUM CHLORIDE 0.9 % IV SOLN: INTRAVENOUS | Status: DC

## 2022-11-10 MED ORDER — PROPOFOL 500 MG/50ML IV EMUL
INTRAVENOUS | Status: AC
  Filled 2022-11-10: qty 50

## 2022-11-10 MED ORDER — PROPOFOL 10 MG/ML IV BOLUS
INTRAVENOUS | Status: DC | PRN
  Administered 2022-11-10: 140 mg via INTRAVENOUS

## 2022-11-10 SURGICAL SUPPLY — 15 items

## 2022-11-10 NOTE — Discharge Instructions (Signed)

## 2022-11-10 NOTE — H&P (Signed)
[facility] History and Physical   Primary Care Physician:  Pincus Sanes, MD   Reason for Procedure:   Esophageal nodule  Plan:    EGD with biopsies and resection of esophageal mucosal nodule     HPI: Jonathon Armstrong is a 72 y.o. male with GERDm erosive esophagitis with esophageal mucosal nodule is here for evaluation and possible resection.  The risks and benefits as well as alternatives of endoscopic procedure(s) have been discussed and reviewed. All questions answered. The patient agrees to proceed.    Past Medical History:  Diagnosis Date   Allergy    seasonal   Asthma 30-Nov-2017   Asystole (HCC)    "vagal response"post nasal polypectomy   Complication of anesthesia 11/30/04   Coded x2 after nasal polyp removal (Believes vagal nerve response)   Diabetes mellitus without complication (HCC)    pre but take metformin   Diverticulosis of colon    FH: colonic polyps    hx of   Hyperlipidemia    Hypertension    Mitral regurgitation    mild & aortic Valve calcification on 2d echo;sbe prophylaxis   Parotitis, acute 01-Dec-2011   Seasonal rhinitis    Sleep apnea    CPAP    Past Surgical History:  Procedure Laterality Date   APPENDECTOMY     asytole post nasal polypectomy  03/24/2006   colonoscopy with polypectomy  03/24/2008   Dr Juanda Chance   heriorraphy     x2   HERNIA REPAIR     INTERCOSTAL NERVE BLOCK Left 08/16/2019   Procedure: Intercostal Nerve Block;  Surgeon: Corliss Skains, MD;  Location: [facility] OR;  Service: Thoracic;  Laterality: Left;   LUNG LOBECTOMY Left    LLL   MIDDLE EAR SURGERY     TM replacement   NASAL SINUS SURGERY  11/30/92, [id number]   X3   NODE DISSECTION  08/16/2019   Procedure: Node Dissection;  Surgeon: Corliss Skains, MD;  Location: [facility] OR;  Service: Thoracic;;   TONSILLECTOMY AND ADENOIDECTOMY     VIDEO BRONCHOSCOPY N/A 08/16/2019   Procedure: VIDEO BRONCHOSCOPY;  Surgeon: Corliss Skains, MD;  Location: [facility] OR;  Service:  Thoracic;  Laterality: N/A;    Prior to Admission medications   Medication Sig Start Date End Date Taking? Authorizing Provider  aspirin EC 81 MG tablet Take 1 tablet (81 mg total) by mouth daily. Swallow whole. 06/21/20  Yes Lewayne Bunting, MD  Cholecalciferol (VITAMIN D-3 PO) Take by mouth daily.   Yes [provider]  Cyanocobalamin (VITAMIN B-12 IJ) Inject 1,000 mcg as directed every 30 (thirty) days.   Yes [provider]  cyanocobalamin (VITAMIN B12) 1000 MCG tablet Take 1,000 mcg by mouth daily.   Yes [provider]  FEROSUL 325 (65 Fe) MG tablet TAKE ONE TABLET BY MOUTH DAILY WITH BREAKFAST 08/13/22  Yes Georga Kaufmann T, PA-C  labetalol (NORMODYNE) 200 MG tablet Take 1 tablet (200 mg total) by mouth 2 (two) times daily. 11/04/22  Yes Lewayne Bunting, MD  Omega-3 Fatty Acids (FISH OIL PO) Take by mouth.   Yes [provider]  pantoprazole (PROTONIX) 40 MG tablet Take 1 tablet (40 mg total) by mouth 2 (two) times daily before a meal. 07/04/22  Yes Migel Hannis, Eleonore Chiquito, MD  rosuvastatin (CRESTOR) 40 MG tablet Take 1 tablet (40 mg total) by mouth daily. 07/31/22 11/10/22 Yes Lewayne Bunting, MD  spironolactone (ALDACTONE) 25 MG tablet TAKE ONE TABLET ONCE DAILY 08/20/22  Yes Burns, Bobette Mo, MD  telmisartan (MICARDIS) 80 MG tablet Take 1 tablet (80 mg total) by mouth daily. 11/04/22  Yes Lewayne Bunting, MD  FEROSUL 325 (65 Fe) MG tablet Take 325 mg by mouth every morning. 06/02/22   [provider]  icosapent Ethyl (VASCEPA) 1 g capsule Take 2 capsules (2 g total) by mouth 2 (two) times daily. Patient not taking: Reported on 10/22/2022 03/04/22   Pincus Sanes, MD  JARDIANCE 25 MG TABS tablet Take 1 tablet (25 mg total) by mouth daily before breakfast. 04/14/22   Burns, Bobette Mo, MD  metFORMIN (GLUCOPHAGE) 500 MG tablet TAKE TWO TABLETS BY MOUTH TWICE DAILY WITH FOOD 08/20/22   Burns, Bobette Mo, MD  Semaglutide (RYBELSUS) 7 MG TABS Take 1 tablet by  mouth every day. Follow-up appt is due must see provider for future refills 10/20/22   Pincus Sanes, MD    Current Facility-Administered Medications  Medication Dose Route Frequency Provider Last Rate Last Admin   0.9 %  sodium chloride infusion   Intravenous Continuous Haris Baack, Eleonore Chiquito, MD       lactated ringers infusion   Intravenous Continuous Othell Jaime, Eleonore Chiquito, MD        Allergies as of 08/04/2022 - Review Complete 07/08/2022  Allergen Reaction Noted   Diltiazem hcl Cough 01/19/2018   Diltiazem hcl Other (See Comments) and Cough    Penicillins      Family History  Problem Relation Age of Onset   Cancer Mother        liver &multiple myeloma   Heart disease Mother        CABG, Angina   Hyperlipidemia Mother    Hypertension Father    Heart disease Father        MI in 43"s   Dementia Father    COPD Sister    Other Sister        perforated bowel   Other Sister        cns aneurysm   Cancer Maternal Grandfather        bone   Diabetes Neg Hx    Stroke Neg Hx    Colon cancer Neg Hx     Social History   Socioeconomic History   Marital status: Married    Spouse name: Not on file   Number of children: 2   Years of education: 18   Highest education level: Not on file  Occupational History   Occupation: Retired    Associate Professor: RETIRED  Tobacco Use   Smoking status: Never   Smokeless tobacco: Never  Vaping Use   Vaping status: Never Used  Substance and Sexual Activity   Alcohol use: Yes    Alcohol/week: 2.0 standard drinks of alcohol    Types: 1 Glasses of wine, 1 Shots of liquor per week    Comment: socially < 2/ week   Drug use: No   Sexual activity: Yes  Other Topics Concern   Not on file  Social History Narrative   Fun: Water, swimming, [employer]; Volunteer work    Denies abuse and feels safe at home.   Regular exercise      Social Determinants of Health   Financial Resource Strain: Low Risk  (05/29/2021)   Overall Financial Resource Strain (CARDIA)     Difficulty of Paying Living Expenses: Not hard at all  Food Insecurity: No Food Insecurity (05/29/2021)   Hunger Vital Sign    Worried About Running Out of Food in  the Last Year: Never true    Ran Out of Food in the Last Year: Never true  Transportation Needs: No Transportation Needs (05/29/2021)   PRAPARE - Administrator, Civil Service (Medical): No    Lack of Transportation (Non-Medical): No  Physical Activity: Sufficiently Active (05/29/2021)   Exercise Vital Sign    Days of Exercise per Week: 5 days    Minutes of Exercise per Session: 30 min  Stress: No Stress Concern Present (05/29/2021)   [employer] of Occupational Health - Occupational Stress Questionnaire    Feeling of Stress : Not at all  Social Connections: Socially Integrated (05/29/2021)   Social Connection and Isolation Panel [NHANES]    Frequency of Communication with Friends and Family: More than three times a week    Frequency of Social Gatherings with Friends and Family: More than three times a week    Attends Religious Services: More than 4 times per year    Active Member of [employer] or Organizations: Yes    Attends Engineer, structural: More than 4 times per year    Marital Status: Married  Catering manager Violence: Not At Risk (05/29/2021)   Humiliation, Afraid, Rape, and Kick questionnaire    Fear of Current or Ex-Partner: No    Emotionally Abused: No    Physically Abused: No    Sexually Abused: No    Review of Systems:  All other review of systems negative except as mentioned in the HPI.  Physical Exam: Vital signs in last 24 hours: Temp:  [97.3 F (36.3 C)] 97.3 F (36.3 C) (08/19 0810) Pulse Rate:  [66] 66 (08/19 0810) Resp:  [13] 13 (08/19 0810) BP: (124)/(50) 124/50 (08/19 0810) SpO2:  [99 %] 99 % (08/19 0810) Weight:  [99.8 kg] 99.8 kg (08/19 0810)   General:   Alert, NAD Lungs:  Clear .   Heart:  Regular rate and rhythm Abdomen:  Soft, nontender and  nondistended. Neuro/Psych:  Alert and cooperative. Normal mood and affect. A and O x 3   K. Scherry Ran , MD [phone]

## 2022-11-10 NOTE — Transfer of Care (Signed)
Immediate Anesthesia Transfer of Care Note  Patient: Jonathon Armstrong  Procedure(s) Performed: ESOPHAGOGASTRODUODENOSCOPY (EGD) WITH PROPOFOL BIOPSY  Patient Location: PACU and Endoscopy Unit  Anesthesia Type:MAC  Level of Consciousness: awake, oriented, sedated, and patient cooperative  Airway & Oxygen Therapy: Patient Spontanous Breathing and Patient connected to face mask oxygen  Post-op Assessment: Report given to RN and Post -op Vital signs reviewed and stable  Post vital signs: Reviewed and stable  Last Vitals:  Vitals Value Taken Time  BP 85/54 11/10/22 0951  Temp 35.9 C 11/10/22 0947  Pulse 63 11/10/22 0953  Resp 18 11/10/22 0953  SpO2 96 % 11/10/22 0953  Vitals shown include unfiled device data.  Last Pain:  Vitals:   11/10/22 0950  TempSrc:   PainSc: 0-No pain         Complications: No notable events documented.

## 2022-11-10 NOTE — Anesthesia Postprocedure Evaluation (Signed)
Anesthesia Post Note  Patient: Jonathon Armstrong  Procedure(s) Performed: ESOPHAGOGASTRODUODENOSCOPY (EGD) WITH PROPOFOL POLYPECTOMY HEMOSTASIS CLIP PLACEMENT     Patient location during evaluation: PACU Anesthesia Type: MAC Level of consciousness: awake and alert Pain management: pain level controlled Vital Signs Assessment: post-procedure vital signs reviewed and stable Respiratory status: spontaneous breathing Cardiovascular status: stable Anesthetic complications: no   No notable events documented.  Last Vitals:  Vitals:   11/10/22 1010 11/10/22 1020  BP: (!) 107/56 (!) 111/59  Pulse: 66 67  Resp: 19 16  Temp:    SpO2: 96% 96%    Last Pain:  Vitals:   11/10/22 1020  TempSrc:   PainSc: 0-No pain                 Lewie Loron

## 2022-11-10 NOTE — Op Note (Addendum)
[facility] Patient Name: Jonathon Armstrong Procedure Date: 11/10/2022 MRN: [id number] Attending MD: Napoleon Form , MD, [id number] Date of Birth: 1951/02/04 CSN: [id number] Age: 72 Admit Type: Outpatient Procedure:                Upper GI endoscopy Indications:              Endoscopic removal of selected lesion in the                            esophagus, muocal nodule with villous changes Providers:                Napoleon Form, MD, Adin Hector, RN, Fransisca Connors, Faustina Mbumina, Technician Referring MD:              Medicines:                Monitored Anesthesia Care Complications:            No immediate complications. Estimated Blood Loss:     Estimated blood loss was minimal. Procedure:                Pre-Anesthesia Assessment:                           - Prior to the procedure, a History and Physical                            was performed, and patient medications and                            allergies were reviewed. The patient's tolerance of                            previous anesthesia was also reviewed. The risks                            and benefits of the procedure and the sedation                            options and risks were discussed with the patient.                            All questions were answered, and informed consent                            was obtained. Prior Anticoagulants: The patient has                            taken no anticoagulant or antiplatelet agents. ASA                            Grade Assessment: II - A patient with mild systemic  disease. After reviewing the risks and benefits,                            the patient was deemed in satisfactory condition to                            undergo the procedure.                           After obtaining informed consent, the endoscope was                            passed under direct vision. Throughout the                             procedure, the patient's blood pressure, pulse, and                            oxygen saturations were monitored continuously. The                            upper GI endoscopy was accomplished without                            difficulty. The patient tolerated the procedure                            well. The GIF-H190 ([id number]) Olympus endoscope was                            introduced through the mouth, and advanced to the                            second part of duodenum. Scope In: Scope Out: Findings:      LA Grade C (one or more mucosal breaks continuous between tops of 2 or       more mucosal folds, less than 75% circumference) esophagitis with no       bleeding was found 36 to 38 cm from the incisors.      A single 14 mm mucosal nodule with a localized distribution was found at       the gastroesophageal junction, 38 cm from the incisors. The nodule was       Paris classification IIa (superficial, elevated). The polyp was removed       with a hot snare. Resection and retrieval were complete. To prevent       bleeding after the polypectomy, three hemostatic clips were successfully       placed. Clip manufacturer: [employer]. There was no bleeding at       the end of the procedure.      The stomach was normal.      The cardia and gastric fundus were normal on retroflexion.      The examined duodenum was normal. Impression:               - LA Grade C reflux esophagitis with no bleeding.                           -  Mucosal nodule found in the esophagus. Clips were                            placed. Clip manufacturer: [employer].                           - Normal stomach.                           - Normal examined duodenum. Moderate Sedation:      N/A Recommendation:           - Patient has a contact number available for                            emergencies. The signs and symptoms of potential                            delayed  complications were discussed with the                            patient. Return to normal activities tomorrow.                            Written discharge instructions were provided to the                            patient.                           - Full liquid diet today, then advance as tolerated                            to mechanical soft diet for 2 weeks.                           - No aspirin, ibuprofen, naproxen, or other                            non-steroidal anti-inflammatory drugs for 2 weeks                            after polyp removal.                           - Follow an antireflux regimen.                           - Use Protonix (pantoprazole) 40 mg PO BID for 3                            months.                           - Repeat upper endoscopy in 3 months for  surveillance based on pathology results. Procedure Code(s):        --- Professional ---                           [phone], Esophagogastroduodenoscopy, flexible,                            transoral; with removal of tumor(s), polyp(s), or                            other lesion(s) by snare technique Diagnosis Code(s):        --- Professional ---                           K21.00, Gastro-esophageal reflux disease with                            esophagitis, without bleeding                           K22.89, Other specified disease of esophagus CPT copyright 2022 American Medical Association. All rights reserved. The codes documented in this report are preliminary and upon coder review may  be revised to meet current compliance requirements. Napoleon Form, MD 11/10/2022 9:57:02 AM This report has been signed electronically. Number of Addenda: 0

## 2022-11-12 ENCOUNTER — Encounter: Payer: Self-pay | Admitting: Gastroenterology

## 2022-11-12 LAB — SURGICAL PATHOLOGY

## 2022-11-15 ENCOUNTER — Encounter (HOSPITAL_COMMUNITY): Payer: Self-pay | Admitting: Gastroenterology

## 2022-11-17 NOTE — Progress Notes (Unsigned)
    Aleen Sells D.[facility] [address] [place] [address] Phone: [phone]   Assessment and Plan:     There are no diagnoses linked to this encounter.  ***   Pertinent previous records reviewed include ***   Follow Up: ***     Subjective:   I, Jonathon Armstrong, am serving as a Neurosurgeon for Doctor Richardean Sale  Chief Complaint: left knee pain   HPI:   11/18/2022 Patient is a 72 year old male complaining of left knee pain. Patient states   Relevant Historical Information: ***  Additional pertinent review of systems negative.   Current Outpatient Medications:    aspirin EC 81 MG tablet, Take 1 tablet (81 mg total) by mouth daily. Swallow whole., Disp: 90 tablet, Rfl: 3   Cholecalciferol (VITAMIN D-3 PO), Take by mouth daily., Disp: , Rfl:    Cyanocobalamin (VITAMIN B-12 IJ), Inject 1,000 mcg as directed every 30 (thirty) days., Disp: , Rfl:    cyanocobalamin (VITAMIN B12) 1000 MCG tablet, Take 1,000 mcg by mouth daily., Disp: , Rfl:    FEROSUL 325 (65 Fe) MG tablet, TAKE ONE TABLET BY MOUTH DAILY WITH BREAKFAST, Disp: 30 tablet, Rfl: 3   JARDIANCE 25 MG TABS tablet, Take 1 tablet (25 mg total) by mouth daily before breakfast., Disp: 90 tablet, Rfl: 1   labetalol (NORMODYNE) 200 MG tablet, Take 1 tablet (200 mg total) by mouth 2 (two) times daily., Disp: 180 tablet, Rfl: 0   metFORMIN (GLUCOPHAGE) 500 MG tablet, TAKE TWO TABLETS BY MOUTH TWICE DAILY WITH FOOD, Disp: 360 tablet, Rfl: 1   Omega-3 Fatty Acids (FISH OIL PO), Take by mouth., Disp: , Rfl:    pantoprazole (PROTONIX) 40 MG tablet, Take 1 tablet (40 mg total) by mouth 2 (two) times daily before a meal., Disp: 60 tablet, Rfl: 5   rosuvastatin (CRESTOR) 40 MG tablet, Take 1 tablet (40 mg total) by mouth daily., Disp: 90 tablet, Rfl: 1   Semaglutide (RYBELSUS) 7 MG TABS, Take 1 tablet by mouth every day. Follow-up appt is due must see provider for future refills, Disp: 30  tablet, Rfl: 0   spironolactone (ALDACTONE) 25 MG tablet, TAKE ONE TABLET ONCE DAILY, Disp: 90 tablet, Rfl: 1   telmisartan (MICARDIS) 80 MG tablet, Take 1 tablet (80 mg total) by mouth daily., Disp: 90 tablet, Rfl: 0   Objective:     There were no vitals filed for this visit.    There is no height or weight on file to calculate BMI.    Physical Exam:    ***   Electronically signed by:  Aleen Sells D.[facility] 12:22 PM 11/17/22

## 2022-11-18 ENCOUNTER — Ambulatory Visit (INDEPENDENT_AMBULATORY_CARE_PROVIDER_SITE_OTHER): Payer: Medicare Other

## 2022-11-18 ENCOUNTER — Ambulatory Visit (INDEPENDENT_AMBULATORY_CARE_PROVIDER_SITE_OTHER): Payer: Medicare Other | Admitting: Sports Medicine

## 2022-11-18 VITALS — HR 76 | Ht 70.0 in | Wt 218.0 lb

## 2022-11-18 DIAGNOSIS — L812 Freckles: Secondary | ICD-10-CM | POA: Diagnosis not present

## 2022-11-18 DIAGNOSIS — M25562 Pain in left knee: Secondary | ICD-10-CM

## 2022-11-18 DIAGNOSIS — D1801 Hemangioma of skin and subcutaneous tissue: Secondary | ICD-10-CM | POA: Diagnosis not present

## 2022-11-18 DIAGNOSIS — Z85828 Personal history of other malignant neoplasm of skin: Secondary | ICD-10-CM | POA: Diagnosis not present

## 2022-11-18 DIAGNOSIS — M1712 Unilateral primary osteoarthritis, left knee: Secondary | ICD-10-CM | POA: Diagnosis not present

## 2022-11-18 DIAGNOSIS — L821 Other seborrheic keratosis: Secondary | ICD-10-CM | POA: Diagnosis not present

## 2022-11-18 DIAGNOSIS — L570 Actinic keratosis: Secondary | ICD-10-CM | POA: Diagnosis not present

## 2022-11-18 DIAGNOSIS — D2371 Other benign neoplasm of skin of right lower limb, including hip: Secondary | ICD-10-CM | POA: Diagnosis not present

## 2022-11-18 MED ORDER — MELOXICAM 15 MG PO TABS: 15.0000 mg | ORAL_TABLET | Freq: Every day | ORAL | 0 refills | Status: DC

## 2022-11-18 NOTE — Patient Instructions (Signed)
-   Start meloxicam 15 mg daily x2 weeks.  If still having pain after 2 weeks, complete 3rd-week of meloxicam. May use remaining meloxicam as needed once daily for pain control.  Do not to use additional NSAIDs while taking meloxicam.  May use Tylenol [phone] mg 2 to 3 times a day for breakthrough pain. Knee HEP 4 week follow up

## 2022-11-19 ENCOUNTER — Other Ambulatory Visit: Payer: Self-pay | Admitting: Hematology and Oncology

## 2022-11-19 DIAGNOSIS — D649 Anemia, unspecified: Secondary | ICD-10-CM

## 2022-11-19 DIAGNOSIS — D508 Other iron deficiency anemias: Secondary | ICD-10-CM

## 2022-11-19 NOTE — Progress Notes (Unsigned)
[facility] [phone]   [phone]  PROGRESS NOTE  Patient Care Team: Pincus Sanes, MD as PCP - General (Internal Medicine) Jens Som Madolyn Frieze, MD as PCP - Cardiology (Cardiology) [place], Heather, OD as Consulting Physician (Optometry) Erlene Quan, Vinnie Level, [facility] (Inactive) (Pharmacist)   CHIEF COMPLAINTS/PURPOSE OF CONSULTATION:  Normocytic Anemia of Unclear Etiology  HISTORY OF PRESENTING ILLNESS:  Jonathon Armstrong 72 y.o. male returns for a follow up for normocytic anemia/ iron deficiency anemia. He is unaccompanied for this visit.   On exam today, Jonathon Armstrong reports he has been well overall in the room since her last visit.  He reports his energy is about a 9 out of 10 and his appetite is excellent.  He is not having any lightheadedness, dizziness, or shortness of breath.  He reports he has not had any issues with bleeding, bruising, or dark stools.  He notes that he did recently undergo a colonoscopy and endoscopy which did show a polyp at the juncture between his small bowel and his stomach.  It was not clear if that was a source of bleeding.  He notes he is not having any dark stools or sticky tarry stools.  He reports that he did have to eat a soft diet for about 1-1/2-week after the procedure.  He notes that the iron pills you are taking is not causing any trouble.  He is not having any stomach upset, constipation.  He takes vitamin C with it.  Overall he feels well with no questions concerns or complaints today.  Overall he is at his baseline level of health.  He denies fevers, chills, sweats, shortness of breath, chest pain or cough. He has no other complaints. Rest of the 10 point ROS is below.   MEDICAL HISTORY:  Past Medical History:  Diagnosis Date   Allergy    seasonal   Asthma 11/28/17   Asystole (HCC)    "vagal response"post nasal polypectomy   Complication of anesthesia 11-28-04   Coded x2 after nasal polyp removal (Believes vagal nerve  response)   Diabetes mellitus without complication (HCC)    pre but take metformin   Diverticulosis of colon    FH: colonic polyps    hx of   Hyperlipidemia    Hypertension    Mitral regurgitation    mild & aortic Valve calcification on 2d echo;sbe prophylaxis   Parotitis, acute 29-Nov-2011   Seasonal rhinitis    Sleep apnea    CPAP    SURGICAL HISTORY: Past Surgical History:  Procedure Laterality Date   APPENDECTOMY     asytole post nasal polypectomy  03/24/2006   colonoscopy with polypectomy  03/24/2008   Dr Juanda Chance   ESOPHAGOGASTRODUODENOSCOPY (EGD) WITH PROPOFOL N/A 11/10/2022   Procedure: ESOPHAGOGASTRODUODENOSCOPY (EGD) WITH PROPOFOL;  Surgeon: Napoleon Form, MD;  Location: [facility];  Service: Gastroenterology;  Laterality: N/A;   HEMOSTASIS CLIP PLACEMENT  11/10/2022   Procedure: HEMOSTASIS CLIP PLACEMENT;  Surgeon: Napoleon Form, MD;  Location: [facility];  Service: Gastroenterology;;   heriorraphy     x2   HERNIA REPAIR     INTERCOSTAL NERVE BLOCK Left 08/16/2019   Procedure: Intercostal Nerve Block;  Surgeon: Corliss Skains, MD;  [facility]: [facility] OR;  Service: Thoracic;  Laterality: Left;   LUNG LOBECTOMY Left    LLL   MIDDLE EAR SURGERY     TM replacement   NASAL SINUS SURGERY  1994, [id number]   X3   NODE DISSECTION  08/16/2019   Procedure: Node Dissection;  Surgeon: Corliss Skains, MD;  Location: [facility];  Service: Thoracic;;   POLYPECTOMY  11/10/2022   Procedure: POLYPECTOMY;  Surgeon: Napoleon Form, MD;  Location: [facility];  Service: Gastroenterology;;   TONSILLECTOMY AND ADENOIDECTOMY     VIDEO BRONCHOSCOPY N/A 08/16/2019   Procedure: VIDEO BRONCHOSCOPY;  Surgeon: Corliss Skains, MD;  Location: [facility] OR;  Service: Thoracic;  Laterality: N/A;    SOCIAL HISTORY: Social History   Socioeconomic History   Marital status: Married    Spouse name: Not on file   Number of children: 2   Years of education: 18   Highest education  level: Not on file  Occupational History   Occupation: Retired    Associate Professor: RETIRED  Tobacco Use   Smoking status: Never   Smokeless tobacco: Never  Vaping Use   Vaping status: Never Used  Substance and Sexual Activity   Alcohol use: Yes    Alcohol/week: 2.0 standard drinks of alcohol    Types: 1 Glasses of wine, 1 Shots of liquor per week    Comment: socially < 2/ week   Drug use: No   Sexual activity: Yes  Other Topics Concern   Not on file  Social History Narrative   Fun: Water, swimming, [employer]; Volunteer work    Denies abuse and feels safe at home.   Regular exercise      Social Determinants of Health   Financial Resource Strain: Low Risk  (05/29/2021)   Overall Financial Resource Strain (CARDIA)    Difficulty of Paying Living Expenses: Not hard at all  Food Insecurity: No Food Insecurity (05/29/2021)   Hunger Vital Sign    Worried About Running Out of Food in the Last Year: Never true    Ran Out of Food in the Last Year: Never true  Transportation Needs: No Transportation Needs (05/29/2021)   PRAPARE - Administrator, Civil Service (Medical): No    Lack of Transportation (Non-Medical): No  Physical Activity: Sufficiently Active (05/29/2021)   Exercise Vital Sign    Days of Exercise per Week: 5 days    Minutes of Exercise per Session: 30 min  Stress: No Stress Concern Present (05/29/2021)   [employer] of Occupational Health - Occupational Stress Questionnaire    Feeling of Stress : Not at all  Social Connections: Socially Integrated (05/29/2021)   Social Connection and Isolation Panel [NHANES]    Frequency of Communication with Friends and Family: More than three times a week    Frequency of Social Gatherings with Friends and Family: More than three times a week    Attends Religious Services: More than 4 times per year    Active Member of [employer] or Organizations: Yes    Attends Engineer, structural: More than 4 times per year    Marital  Status: Married  Catering manager Violence: Not At Risk (05/29/2021)   Humiliation, Afraid, Rape, and Kick questionnaire    Fear of Current or Ex-Partner: No    Emotionally Abused: No    Physically Abused: No    Sexually Abused: No    FAMILY HISTORY: Family History  Problem Relation Age of Onset   Cancer Mother        liver &multiple myeloma   Heart disease Mother        CABG, Angina   Hyperlipidemia Mother    Hypertension Father    Heart disease Father  MI in 62"s   Dementia Father    COPD Sister    Other Sister        perforated bowel   Other Sister        cns aneurysm   Cancer Maternal Grandfather        bone   Diabetes Neg Hx    Stroke Neg Hx    Colon cancer Neg Hx     ALLERGIES:  is allergic to diltiazem hcl, diltiazem hcl, and penicillins.  MEDICATIONS:  Current Outpatient Medications  Medication Sig Dispense Refill   aspirin EC 81 MG tablet Take 1 tablet (81 mg total) by mouth daily. Swallow whole. 90 tablet 3   Cholecalciferol (VITAMIN D-3 PO) Take by mouth daily.     Cyanocobalamin (VITAMIN B-12 IJ) Inject 1,000 mcg as directed every 30 (thirty) days.     cyanocobalamin (VITAMIN B12) 1000 MCG tablet Take 1,000 mcg by mouth daily.     FEROSUL 325 (65 Fe) MG tablet TAKE ONE TABLET BY MOUTH DAILY WITH BREAKFAST 30 tablet 3   JARDIANCE 25 MG TABS tablet Take 1 tablet (25 mg total) by mouth daily before breakfast. 90 tablet 1   labetalol (NORMODYNE) 200 MG tablet Take 1 tablet (200 mg total) by mouth 2 (two) times daily. 180 tablet 0   meloxicam (MOBIC) 15 MG tablet Take 1 tablet (15 mg total) by mouth daily. 30 tablet 0   metFORMIN (GLUCOPHAGE) 500 MG tablet TAKE TWO TABLETS BY MOUTH TWICE DAILY WITH FOOD 360 tablet 1   Omega-3 Fatty Acids (FISH OIL PO) Take by mouth.     pantoprazole (PROTONIX) 40 MG tablet Take 1 tablet (40 mg total) by mouth 2 (two) times daily before a meal. 60 tablet 5   rosuvastatin (CRESTOR) 40 MG tablet Take 1 tablet (40 mg total) by  mouth daily. 90 tablet 1   Semaglutide (RYBELSUS) 7 MG TABS TAKE 1 TABLET BY MOUTH EVERY DAY. FOLLOW-UP APPOINTMENT IS DUE 30 tablet 0   spironolactone (ALDACTONE) 25 MG tablet TAKE ONE TABLET ONCE DAILY 90 tablet 1   telmisartan (MICARDIS) 80 MG tablet Take 1 tablet (80 mg total) by mouth daily. 90 tablet 0   No current facility-administered medications for this visit.    REVIEW OF SYSTEMS:   Constitutional: ( - ) fevers, ( - )  chills , ( - ) night sweats Eyes: ( - ) blurriness of vision, ( - ) double vision, ( - ) watery eyes Ears, nose, mouth, throat, and face: ( - ) mucositis, ( - ) sore throat Respiratory: ( - ) cough, ( - ) dyspnea, ( - ) wheezes Cardiovascular: ( - ) palpitation, ( - ) chest discomfort, ( - ) lower extremity swelling Gastrointestinal:  ( - ) nausea, ( - ) heartburn, ( - ) change in bowel habits Skin: ( - ) abnormal skin rashes Lymphatics: ( - ) new lymphadenopathy, ( - ) easy bruising Neurological: ( - ) numbness, ( - ) tingling, ( - ) new weaknesses Behavioral/Psych: ( - ) mood change, ( - ) new changes  All other systems were reviewed with the patient and are negative.  PHYSICAL EXAMINATION: ECOG PERFORMANCE STATUS: 0 - Asymptomatic  Vitals:   11/20/22 1049  BP: 131/73  Pulse: 68  Resp: 13  Temp: 97.7 F (36.5 C)  SpO2: 98%   Filed Weights   11/20/22 1049  Weight: 220 lb (99.8 kg)    GENERAL: well appearing male in NAD  SKIN: skin  color, texture, turgor are normal, no rashes or significant lesions EYES: conjunctiva are pink and non-injected, sclera clear LUNGS: clear to auscultation and percussion with normal breathing effort HEART: regular rate & rhythm and no murmurs and no lower extremity edema Musculoskeletal: no cyanosis of digits and no clubbing  PSYCH: alert & oriented x 3, fluent speech NEURO: no focal motor/sensory deficits  LABORATORY DATA:  I have reviewed the data as listed    Latest Ref Rng & Units 11/20/2022   10:25 AM  09/18/2022    9:25 AM 08/07/2022    8:53 AM  CBC  WBC 4.0 - 10.5 K/uL 5.9  7.5  5.4   Hemoglobin 13.0 - 17.0 g/dL 13.2  44.0  10.2   Hematocrit 39.0 - 52.0 % 32.3  34.4  32.7   Platelets 150 - 400 K/uL 145  168  145        Latest Ref Rng & Units 11/20/2022   10:25 AM 06/25/2022   10:00 AM 03/25/2022   12:43 PM  CMP  Glucose 70 - 99 mg/dL 725  366  440   BUN 8 - 23 mg/dL 26  22  18    Creatinine 0.61 - 1.24 mg/dL 3.47  4.25  9.56   Sodium 135 - 145 mmol/L 139  140  140   Potassium 3.5 - 5.1 mmol/L 4.7  4.3  4.2   Chloride 98 - 111 mmol/L 106  107  106   CO2 22 - 32 mmol/L 25  23  25    Calcium 8.9 - 10.3 mg/dL 9.9  38.7  56.4   Total Protein 6.5 - 8.1 g/dL 7.0  7.0  7.1   Total Bilirubin 0.3 - 1.2 mg/dL 0.5  0.5  0.4   Alkaline Phos 38 - 126 U/L 61  62  59   AST 15 - 41 U/L 31  18  18    ALT 0 - 44 U/L 37  18  21    ASSESSMENT & PLAN Jonathon Armstrong is a 72 y.o. male returns for a follow up for iron deficiency anemia.   #Normocytic anemia: #Iron deficiency anemia: --Etiology unknown. Underwent EGD and colonoscopy on 06/18/2022. EGD showed single mucosal nodule in GE junction that was negative for dysplasia or malignancy. Otherwise normal EGD.  Colonoscopy showed several polyps that were negative for dysplasia or malignancy, moderate diverticulosis without evidence of bleeding, non-bleeding external and internal hemorrhoids.  --Currently on PO iron daily without any toxicities. --Labs from today show persistent anemia with Hgb 11.7, WBC 7.5, MCV 90.5, Plt 168 --if iron levels are persistently low, recommend IV iron to help bolster his iron levels.  --Given the persistence of his normocytic anemia without clear iron deficiency I would recommend considering a bone marrow biopsy.  We have not found any clear reason why his hemoglobin is below normal limits. --Patient opts for observation at this time and declines bone marrow biopsy unless absolutely necessary.  If hemoglobin is  consistently below 11 would recommend consideration of this procedure. --RTC in 6 months with 58-month interval labs.  #Thrombocytopenia-resolved --Etiology unknown but possible immune mediated process since immature platelet fraction was elevated on 03/25/2022. Remaining workup was negative for vitamin B12 and folate deficiencies or hepatitis B/C. --Platelet counts are back to normal, appear to fluctuate from low normal to slightly low. --No further workup required at this time.   No orders of the defined types were placed in this encounter.   All questions were answered. The patient knows  to call the clinic with any problems, questions or concerns.  I have spent a total of 30 minutes minutes of face-to-face and non-face-to-face time, preparing to see the patient, performing a medically appropriate examination, counseling and educating the patient, ordering meds,  documenting clinical information in the electronic health record, and care coordination.   Ulysees Barns, MD Department of Hematology/Oncology [facility] Phone: [phone] Pager: [phone] Email: [email or web address]

## 2022-11-20 ENCOUNTER — Inpatient Hospital Stay (HOSPITAL_BASED_OUTPATIENT_CLINIC_OR_DEPARTMENT_OTHER): Payer: Medicare Other | Admitting: Hematology and Oncology

## 2022-11-20 ENCOUNTER — Other Ambulatory Visit: Payer: Self-pay | Admitting: Internal Medicine

## 2022-11-20 ENCOUNTER — Inpatient Hospital Stay: Payer: Medicare Other | Attending: Physician Assistant

## 2022-11-20 VITALS — BP 131/73 | HR 68 | Temp 97.7°F | Resp 13 | Wt 220.0 lb

## 2022-11-20 DIAGNOSIS — D696 Thrombocytopenia, unspecified: Secondary | ICD-10-CM | POA: Diagnosis not present

## 2022-11-20 DIAGNOSIS — D508 Other iron deficiency anemias: Secondary | ICD-10-CM

## 2022-11-20 DIAGNOSIS — D649 Anemia, unspecified: Secondary | ICD-10-CM

## 2022-11-20 DIAGNOSIS — D509 Iron deficiency anemia, unspecified: Secondary | ICD-10-CM | POA: Diagnosis not present

## 2022-11-20 LAB — CMP (CANCER CENTER ONLY)
ALT: 37 U/L (ref 0–44)
AST: 31 U/L (ref 15–41)
Albumin: 4.7 g/dL (ref 3.5–5.0)
Alkaline Phosphatase: 61 U/L (ref 38–126)
Anion gap: 8 (ref 5–15)
BUN: 26 mg/dL — ABNORMAL HIGH (ref 8–23)
CO2: 25 mmol/L (ref 22–32)
Calcium: 9.9 mg/dL (ref 8.9–10.3)
Chloride: 106 mmol/L (ref 98–111)
Creatinine: 1.46 mg/dL — ABNORMAL HIGH (ref 0.61–1.24)
GFR, Estimated: 50 mL/min — ABNORMAL LOW (ref >60)
Glucose, Bld: 182 mg/dL — ABNORMAL HIGH (ref 70–99)
Potassium: 4.7 mmol/L (ref 3.5–5.1)
Sodium: 139 mmol/L (ref 135–145)
Total Bilirubin: 0.5 mg/dL (ref 0.3–1.2)
Total Protein: 7 g/dL (ref 6.5–8.1)

## 2022-11-20 LAB — CBC WITH DIFFERENTIAL (CANCER CENTER ONLY)
Abs Immature Granulocytes: 0.03 10*3/uL (ref 0.00–0.07)
Basophils Absolute: 0 10*3/uL (ref 0.0–0.1)
Basophils Relative: 1 %
Eosinophils Absolute: 0.1 10*3/uL (ref 0.0–0.5)
Eosinophils Relative: 2 %
HCT: 32.3 % — ABNORMAL LOW (ref 39.0–52.0)
Hemoglobin: 11.1 g/dL — ABNORMAL LOW (ref 13.0–17.0)
Immature Granulocytes: 1 %
Lymphocytes Relative: 26 %
Lymphs Abs: 1.5 10*3/uL (ref 0.7–4.0)
MCH: 31 pg (ref 26.0–34.0)
MCHC: 34.4 g/dL (ref 30.0–36.0)
MCV: 90.2 fL (ref 80.0–100.0)
Monocytes Absolute: 0.6 10*3/uL (ref 0.1–1.0)
Monocytes Relative: 9 %
Neutro Abs: 3.6 10*3/uL (ref 1.7–7.7)
Neutrophils Relative %: 61 %
Platelet Count: 145 10*3/uL — ABNORMAL LOW (ref 150–400)
RBC: 3.58 MIL/uL — ABNORMAL LOW (ref 4.22–5.81)
RDW: 14.1 % (ref 11.5–15.5)
WBC Count: 5.9 10*3/uL (ref 4.0–10.5)
nRBC: 0 % (ref 0.0–0.2)

## 2022-11-20 LAB — RETIC PANEL
Immature Retic Fract: 23.1 % — ABNORMAL HIGH (ref 2.3–15.9)
RBC.: 3.51 MIL/uL — ABNORMAL LOW (ref 4.22–5.81)
Retic Count, Absolute: 76.2 10*3/uL (ref 19.0–186.0)
Retic Ct Pct: 2.2 % (ref 0.4–3.1)
Reticulocyte Hemoglobin: 33.9 pg (ref >27.9)

## 2022-11-20 LAB — IRON AND IRON BINDING CAPACITY (CC-WL,HP ONLY)
Iron: 84 ug/dL (ref 45–182)
Saturation Ratios: 20 % (ref 17.9–39.5)
TIBC: 414 ug/dL (ref 250–450)
UIBC: 330 ug/dL

## 2022-11-20 LAB — FERRITIN: Ferritin: 107 ng/mL (ref 24–336)

## 2022-11-26 ENCOUNTER — Telehealth: Payer: Self-pay | Admitting: Internal Medicine

## 2022-11-26 ENCOUNTER — Ambulatory Visit (INDEPENDENT_AMBULATORY_CARE_PROVIDER_SITE_OTHER): Payer: Medicare Other

## 2022-11-26 DIAGNOSIS — E538 Deficiency of other specified B group vitamins: Secondary | ICD-10-CM | POA: Diagnosis not present

## 2022-11-26 MED ORDER — CYANOCOBALAMIN 1000 MCG/ML IJ SOLN
1000.0000 ug | Freq: Once | INTRAMUSCULAR | Status: AC
Start: 2022-11-26 — End: 2022-11-26
  Administered 2022-11-26: 1000 ug via INTRAMUSCULAR

## 2022-11-26 NOTE — Progress Notes (Signed)
After obtaining consent, and per orders of Dr. Lawerance Bach, injection of B12 given by Daryll Brod. Patient instructed to report any adverse reaction to the office Immediately.

## 2022-11-26 NOTE — Telephone Encounter (Signed)
Patient said he received a notification in MyChart that he needs labs done. He was unaware of this and would like to know what it's about. Patient would like a call back at [phone].

## 2022-11-26 NOTE — Telephone Encounter (Signed)
There are no upcoming lab appointments however he does have an upcoming B12 appointment on nurse (message left with this info).  He has also been told to contact office to set up his 6 month follow up appointment as he is past due and was suppose to come back in June.

## 2022-12-09 NOTE — Progress Notes (Signed)
HPI: FU hyperlipidemia and CAD. I saw him previously for bradycardia in Feb 06, 2007. Holter monitor showed sinus rhythm with brief PAT and PVC. Last echocardiogram 05-Feb-2009 showed normal LV function and mild mitral regurgitation.  Calcium score January 2021 1368.  There was also note of a 13 mm posterior left lower lobe nodule and 103-month repeat chest CT, PET/CT or tissue sampling recommended. Nuclear study January 2021 showed ejection fraction 58% with normal perfusion.  Patient subsequently had left lower lobe lobectomy for lung cancer.  Since last seen the patient denies any dyspnea on exertion, orthopnea, PND, pedal edema, palpitations, syncope or chest pain.   Current Outpatient Medications  Medication Sig Dispense Refill   aspirin EC 81 MG tablet Take 1 tablet (81 mg total) by mouth daily. Swallow whole. 90 tablet 3   Cholecalciferol (VITAMIN D-3 PO) Take by mouth daily.     Cyanocobalamin (VITAMIN B-12 IJ) Inject 1,000 mcg as directed every 30 (thirty) days.     cyanocobalamin (VITAMIN B12) 1000 MCG tablet Take 1,000 mcg by mouth daily.     FEROSUL 325 (65 Fe) MG tablet TAKE ONE TABLET BY MOUTH DAILY WITH BREAKFAST 30 tablet 3   JARDIANCE 25 MG TABS tablet Take 1 tablet (25 mg total) by mouth daily before breakfast. 90 tablet 1   labetalol (NORMODYNE) 200 MG tablet Take 1 tablet (200 mg total) by mouth 2 (two) times daily. 180 tablet 0   meloxicam (MOBIC) 15 MG tablet Take 1 tablet (15 mg total) by mouth daily. 30 tablet 0   metFORMIN (GLUCOPHAGE) 500 MG tablet TAKE TWO TABLETS BY MOUTH TWICE DAILY WITH FOOD 360 tablet 1   Omega-3 Fatty Acids (FISH OIL PO) Take by mouth.     pantoprazole (PROTONIX) 40 MG tablet Take 1 tablet (40 mg total) by mouth 2 (two) times daily before a meal. 60 tablet 5   rosuvastatin (CRESTOR) 40 MG tablet Take 1 tablet (40 mg total) by mouth daily. 90 tablet 1   Semaglutide (RYBELSUS) 7 MG TABS TAKE 1 TABLET BY MOUTH EVERY DAY. FOLLOW-UP APPOINTMENT IS DUE 30 tablet 0    spironolactone (ALDACTONE) 25 MG tablet TAKE ONE TABLET ONCE DAILY 90 tablet 1   telmisartan (MICARDIS) 80 MG tablet Take 1 tablet (80 mg total) by mouth daily. 90 tablet 0   No current facility-administered medications for this visit.     Past Medical History:  Diagnosis Date   Allergy    seasonal   Asthma 02-05-2018   Asystole (HCC)    "vagal response"post nasal polypectomy   Complication of anesthesia 2005-02-05   Coded x2 after nasal polyp removal (Believes vagal nerve response)   Diabetes mellitus without complication (HCC)    pre but take metformin   Diverticulosis of colon    FH: colonic polyps    hx of   Hyperlipidemia    Hypertension    Mitral regurgitation    mild & aortic Valve calcification on 2d echo;sbe prophylaxis   Parotitis, acute 02/06/2012   Seasonal rhinitis    Sleep apnea    CPAP    Past Surgical History:  Procedure Laterality Date   APPENDECTOMY     asytole post nasal polypectomy  03/24/2006   colonoscopy with polypectomy  03/24/2008   Dr Juanda Chance   ESOPHAGOGASTRODUODENOSCOPY (EGD) WITH PROPOFOL N/A 11/10/2022   Procedure: ESOPHAGOGASTRODUODENOSCOPY (EGD) WITH PROPOFOL;  Surgeon: Napoleon Form, MD;  Location: [facility];  Service: Gastroenterology;  Laterality: N/A;   HEMOSTASIS CLIP PLACEMENT  11/10/2022   Procedure: HEMOSTASIS CLIP PLACEMENT;  Surgeon: Napoleon Form, MD;  Location: [facility];  Service: Gastroenterology;;   heriorraphy     x2   HERNIA REPAIR     INTERCOSTAL NERVE BLOCK Left 08/16/2019   Procedure: Intercostal Nerve Block;  Surgeon: Corliss Skains, MD;  Location: [facility];  Service: Thoracic;  Laterality: Left;   LUNG LOBECTOMY Left    LLL   MIDDLE EAR SURGERY     TM replacement   NASAL SINUS SURGERY  1994, [id number],[id number]   X3   NODE DISSECTION  08/16/2019   Procedure: Node Dissection;  Surgeon: Corliss Skains, MD;  Location: [facility];  Service: Thoracic;;   POLYPECTOMY  11/10/2022   Procedure: POLYPECTOMY;  Surgeon:  Napoleon Form, MD;  Location: [facility];  Service: Gastroenterology;;   TONSILLECTOMY AND ADENOIDECTOMY     VIDEO BRONCHOSCOPY N/A 08/16/2019   Procedure: VIDEO BRONCHOSCOPY;  Surgeon: Corliss Skains, MD;  Location: [facility];  Service: Thoracic;  Laterality: N/A;    Social History   Socioeconomic History   Marital status: Married    Spouse name: Not on file   Number of children: 2   Years of education: 18   Highest education level: Not on file  Occupational History   Occupation: Retired    Associate Professor: RETIRED  Tobacco Use   Smoking status: Never   Smokeless tobacco: Never  Vaping Use   Vaping status: Never Used  Substance and Sexual Activity   Alcohol use: Yes    Alcohol/week: 2.0 standard drinks of alcohol    Types: 1 Glasses of wine, 1 Shots of liquor per week    Comment: socially < 2/ week   Drug use: No   Sexual activity: Yes  Other Topics Concern   Not on file  Social History Narrative   Fun: Water, swimming, [employer]; Volunteer work    Denies abuse and feels safe at home.   Regular exercise      Social Determinants of Health   Financial Resource Strain: Low Risk  (05/29/2021)   Overall Financial Resource Strain (CARDIA)    Difficulty of Paying Living Expenses: Not hard at all  Food Insecurity: No Food Insecurity (05/29/2021)   Hunger Vital Sign    Worried About Running Out of Food in the Last Year: Never true    Ran Out of Food in the Last Year: Never true  Transportation Needs: No Transportation Needs (05/29/2021)   PRAPARE - Administrator, Civil Service (Medical): No    Lack of Transportation (Non-Medical): No  Physical Activity: Sufficiently Active (05/29/2021)   Exercise Vital Sign    Days of Exercise per Week: 5 days    Minutes of Exercise per Session: 30 min  Stress: No Stress Concern Present (05/29/2021)   [employer] of Occupational Health - Occupational Stress Questionnaire    Feeling of Stress : Not at all  Social  Connections: Socially Integrated (05/29/2021)   Social Connection and Isolation Panel [NHANES]    Frequency of Communication with Friends and Family: More than three times a week    Frequency of Social Gatherings with Friends and Family: More than three times a week    Attends Religious Services: More than 4 times per year    Active Member of [employer] or Organizations: Yes    Attends Banker Meetings: More than 4 times per year    Marital Status: Married  Catering manager Violence: Not At Risk (05/29/2021)  Humiliation, Afraid, Rape, and Kick questionnaire    Fear of Current or Ex-Partner: No    Emotionally Abused: No    Physically Abused: No    Sexually Abused: No    Family History  Problem Relation Age of Onset   Cancer Mother        liver &multiple myeloma   Heart disease Mother        CABG, Angina   Hyperlipidemia Mother    Hypertension Father    Heart disease Father        MI in 60"s   Dementia Father    COPD Sister    Other Sister        perforated bowel   Other Sister        cns aneurysm   Cancer Maternal Grandfather        bone   Diabetes Neg Hx    Stroke Neg Hx    Colon cancer Neg Hx     ROS: no fevers or chills, productive cough, hemoptysis, dysphasia, odynophagia, melena, hematochezia, dysuria, hematuria, rash, seizure activity, orthopnea, PND, pedal edema, claudication. Remaining systems are negative.  Physical Exam: Well-developed well-nourished in no acute distress.  Skin is warm and dry.  HEENT is normal.  Neck is supple.  Chest is clear to auscultation with normal expansion.  Cardiovascular exam is regular rate and rhythm.  Abdominal exam nontender or distended. No masses palpated. Extremities show no edema. neuro grossly intact  EKG Interpretation Date/Time:  Tuesday December 23 2022 09:45:37 EDT Ventricular Rate:  69 PR Interval:  218 QRS Duration:  74 QT Interval:  414 QTC Calculation: 443 R Axis:   61  Text Interpretation: Sinus  rhythm with 1st degree A-V block Confirmed by Olga Millers ([id number]) on 12/23/2022 9:47:23 AM    A/P  1 coronary calcification-patient denies chest pain.  Previous functional study showed no ischemia.  Continue medical therapy with aspirin and statin.  2 hypertension-patient's blood pressure is controlled.  Continue present medical regimen.  3 hyperlipidemia-continue Crestor.  4 history of bradycardia-this occurred in the postoperative setting and patient has had no recurrences with no syncope.  Will follow.  Olga Millers, MD

## 2022-12-16 ENCOUNTER — Ambulatory Visit: Payer: Medicare Other | Admitting: Sports Medicine

## 2022-12-21 ENCOUNTER — Encounter: Payer: Self-pay | Admitting: Internal Medicine

## 2022-12-21 NOTE — Patient Instructions (Addendum)
      Blood work was ordered.   The lab is on the first floor.    Medications changes include :   none    A referral was ordered and someone will call you to schedule an appointment.     Return in about 6 months (around 06/21/2023) for follow up.

## 2022-12-21 NOTE — Progress Notes (Unsigned)
Subjective:    Patient ID: Jonathon Armstrong, male    DOB: Jul 17, 1950, 72 y.o.   MRN: [id number]     HPI Jonathon Armstrong is here for follow up of his chronic medical problems.  Overall doing well, but stress level has been a little bit higher.  Typically when he is more stressed out he does turn to food.  He has been indulging more and is concerned his blood work will reflect it.  Taking iron, B12, vit C daily.  Had one iron infusion.    Medications and allergies reviewed with patient and updated if appropriate.  Current Outpatient Medications on File Prior to Visit  Medication Sig Dispense Refill   aspirin EC 81 MG tablet Take 1 tablet (81 mg total) by mouth daily. Swallow whole. 90 tablet 3   Cholecalciferol (VITAMIN D-3 PO) Take by mouth daily.     Cyanocobalamin (VITAMIN B-12 IJ) Inject 1,000 mcg as directed every 30 (thirty) days.     FEROSUL 325 (65 Fe) MG tablet TAKE ONE TABLET BY MOUTH DAILY WITH BREAKFAST 30 tablet 3   labetalol (NORMODYNE) 200 MG tablet Take 1 tablet (200 mg total) by mouth 2 (two) times daily. 180 tablet 0   metFORMIN (GLUCOPHAGE) 500 MG tablet TAKE TWO TABLETS BY MOUTH TWICE DAILY WITH FOOD 360 tablet 1   Omega-3 Fatty Acids (FISH OIL PO) Take by mouth.     pantoprazole (PROTONIX) 40 MG tablet Take 1 tablet (40 mg total) by mouth 2 (two) times daily before a meal. 60 tablet 5   Semaglutide (RYBELSUS) 7 MG TABS TAKE 1 TABLET BY MOUTH EVERY DAY. FOLLOW-UP APPOINTMENT IS DUE 30 tablet 0   spironolactone (ALDACTONE) 25 MG tablet TAKE ONE TABLET ONCE DAILY 90 tablet 1   telmisartan (MICARDIS) 80 MG tablet Take 1 tablet (80 mg total) by mouth daily. 90 tablet 0   rosuvastatin (CRESTOR) 40 MG tablet Take 1 tablet (40 mg total) by mouth daily. 90 tablet 1   No current facility-administered medications on file prior to visit.     Review of Systems  Constitutional:  Negative for fatigue and fever.  Respiratory:  Negative for cough, shortness of breath and  wheezing.   Cardiovascular:  Negative for chest pain, palpitations and leg swelling.  Neurological:  Negative for light-headedness and headaches.       Objective:   Vitals:   12/22/22 1045  BP: 126/78  Pulse: 67  Temp: 98 F (36.7 C)  SpO2: 98%   BP Readings from Last 3 Encounters:  12/22/22 126/78  11/20/22 131/73  11/10/22 (!) 111/59   Wt Readings from Last 3 Encounters:  12/22/22 216 lb (98 kg)  11/20/22 220 lb (99.8 kg)  11/18/22 218 lb (98.9 kg)   Body mass index is 30.99 kg/m.    Physical Exam Constitutional:      General: He is not in acute distress.    Appearance: Normal appearance. He is not ill-appearing.  HENT:     Head: Normocephalic and atraumatic.  Eyes:     Conjunctiva/sclera: Conjunctivae normal.  Cardiovascular:     Rate and Rhythm: Normal rate and regular rhythm.     Heart sounds: Normal heart sounds.  Pulmonary:     Effort: Pulmonary effort is normal. No respiratory distress.     Breath sounds: Normal breath sounds. No wheezing or rales.  Musculoskeletal:     Right lower leg: No edema.     Left lower leg: No edema.  Skin:    General: Skin is warm and dry.     Findings: No rash.  Neurological:     Mental Status: He is alert. Mental status is at baseline.  Psychiatric:        Mood and Affect: Mood normal.        Lab Results  Component Value Date   WBC 5.9 11/20/2022   HGB 11.1 (L) 11/20/2022   HCT 32.3 (L) 11/20/2022   PLT 145 (L) 11/20/2022   GLUCOSE 182 (H) 11/20/2022   CHOL 86 02/25/2022   TRIG 215.0 (H) 02/25/2022   HDL 30.90 (L) 02/25/2022   LDLDIRECT 27.0 02/25/2022   LDLCALC 32 09/20/2020   ALT 37 11/20/2022   AST 31 11/20/2022   NA 139 11/20/2022   K 4.7 11/20/2022   CL 106 11/20/2022   CREATININE 1.46 (H) 11/20/2022   BUN 26 (H) 11/20/2022   CO2 25 11/20/2022   TSH 1.41 11/12/2018   PSA 2.61 11/12/2018   INR 1.0 08/15/2019   HGBA1C 7.2 (H) 02/25/2022   MICROALBUR 2.3 (H) 02/25/2022     Assessment & Plan:     See Problem List for Assessment and Plan of chronic medical problems.

## 2022-12-22 ENCOUNTER — Ambulatory Visit (INDEPENDENT_AMBULATORY_CARE_PROVIDER_SITE_OTHER): Payer: Medicare Other | Admitting: Internal Medicine

## 2022-12-22 VITALS — BP 126/78 | HR 67 | Temp 98.0°F | Ht 70.0 in | Wt 216.0 lb

## 2022-12-22 DIAGNOSIS — D649 Anemia, unspecified: Secondary | ICD-10-CM | POA: Diagnosis not present

## 2022-12-22 DIAGNOSIS — I10 Essential (primary) hypertension: Secondary | ICD-10-CM | POA: Diagnosis not present

## 2022-12-22 DIAGNOSIS — E1165 Type 2 diabetes mellitus with hyperglycemia: Secondary | ICD-10-CM

## 2022-12-22 DIAGNOSIS — E782 Mixed hyperlipidemia: Secondary | ICD-10-CM

## 2022-12-22 DIAGNOSIS — Z7984 Long term (current) use of oral hypoglycemic drugs: Secondary | ICD-10-CM

## 2022-12-22 DIAGNOSIS — E538 Deficiency of other specified B group vitamins: Secondary | ICD-10-CM

## 2022-12-22 LAB — CBC WITH DIFFERENTIAL/PLATELET
Basophils Absolute: 0 10*3/uL (ref 0.0–0.1)
Basophils Relative: 0.4 % (ref 0.0–3.0)
Eosinophils Absolute: 0.1 10*3/uL (ref 0.0–0.7)
Eosinophils Relative: 2.8 % (ref 0.0–5.0)
HCT: 35.3 % — ABNORMAL LOW (ref 39.0–52.0)
Hemoglobin: 11.7 g/dL — ABNORMAL LOW (ref 13.0–17.0)
Lymphocytes Relative: 31 % (ref 12.0–46.0)
Lymphs Abs: 1.6 10*3/uL (ref 0.7–4.0)
MCHC: 33.2 g/dL (ref 30.0–36.0)
MCV: 91 fL (ref 78.0–100.0)
Monocytes Absolute: 0.5 10*3/uL (ref 0.1–1.0)
Monocytes Relative: 8.8 % (ref 3.0–12.0)
Neutro Abs: 3 10*3/uL (ref 1.4–7.7)
Neutrophils Relative %: 57 % (ref 43.0–77.0)
Platelets: 170 10*3/uL (ref 150.0–400.0)
RBC: 3.88 Mil/uL — ABNORMAL LOW (ref 4.22–5.81)
RDW: 14.8 % (ref 11.5–15.5)
WBC: 5.2 10*3/uL (ref 4.0–10.5)

## 2022-12-22 LAB — IBC PANEL
Iron: 80 ug/dL (ref 42–165)
Saturation Ratios: 19.9 % — ABNORMAL LOW (ref 20.0–50.0)
TIBC: 401.8 ug/dL (ref 250.0–450.0)
Transferrin: 287 mg/dL (ref 212.0–360.0)

## 2022-12-22 LAB — LIPID PANEL
Cholesterol: 95 mg/dL (ref 0–200)
HDL: 32.3 mg/dL — ABNORMAL LOW (ref >39.00)
LDL Cholesterol: 16 mg/dL (ref 0–99)
NonHDL: 63.15
Total CHOL/HDL Ratio: 3
Triglycerides: 238 mg/dL — ABNORMAL HIGH (ref 0.0–149.0)
VLDL: 47.6 mg/dL — ABNORMAL HIGH (ref 0.0–40.0)

## 2022-12-22 LAB — COMPREHENSIVE METABOLIC PANEL
ALT: 19 U/L (ref 0–53)
AST: 17 U/L (ref 0–37)
Albumin: 4.7 g/dL (ref 3.5–5.2)
Alkaline Phosphatase: 61 U/L (ref 39–117)
BUN: 23 mg/dL (ref 6–23)
CO2: 23 meq/L (ref 19–32)
Calcium: 9.9 mg/dL (ref 8.4–10.5)
Chloride: 107 meq/L (ref 96–112)
Creatinine, Ser: 1.34 mg/dL (ref 0.40–1.50)
GFR: 52.59 mL/min — ABNORMAL LOW (ref >60.00)
Glucose, Bld: 165 mg/dL — ABNORMAL HIGH (ref 70–99)
Potassium: 4.4 meq/L (ref 3.5–5.1)
Sodium: 141 meq/L (ref 135–145)
Total Bilirubin: 0.4 mg/dL (ref 0.2–1.2)
Total Protein: 7 g/dL (ref 6.0–8.3)

## 2022-12-22 LAB — MICROALBUMIN / CREATININE URINE RATIO
Creatinine,U: 74.6 mg/dL
Microalb Creat Ratio: 1.9 mg/g (ref 0.0–30.0)
Microalb, Ur: 1.4 mg/dL (ref 0.0–1.9)

## 2022-12-22 LAB — FERRITIN: Ferritin: 87.7 ng/mL (ref 22.0–322.0)

## 2022-12-22 LAB — HEMOGLOBIN A1C: Hgb A1c MFr Bld: 7.3 % — ABNORMAL HIGH (ref 4.6–6.5)

## 2022-12-22 MED ORDER — EMPAGLIFLOZIN 25 MG PO TABS: 25.0000 mg | ORAL_TABLET | Freq: Every day | ORAL | 1 refills | Status: DC

## 2022-12-22 NOTE — Assessment & Plan Note (Signed)
Chronic Mild, stable Iron deficiency Following with hematology Thrombocytopenia resolved On oral iron Hematology considering bone marrow biopsy-he would like to just monitor for now Will check CBC, iron panel today

## 2022-12-22 NOTE — Assessment & Plan Note (Signed)
Chronic Most recent B12 level is normal Continue B12 supplementation

## 2022-12-22 NOTE — Assessment & Plan Note (Signed)
Chronic Blood pressure well controlled CMP Continue labetalol 200 mg twice daily, spironolactone 25 mg daily, telmisartan 80 mg daily

## 2022-12-22 NOTE — Assessment & Plan Note (Addendum)
Chronic  Lab Results  Component Value Date   HGBA1C 7.2 (H) 02/25/2022   Sugars not ideally controlled-goal less than 7.0% Stress level has been higher and he has not been eating well so expect his A1c to be higher Check A1c Continue Jardiance 25 mg daily, metformin 1000 mg twice daily and Rybelsus 7 mg daily Discussed options if his A1c is not at goal Stressed regular exercise, diabetic diet

## 2022-12-22 NOTE — Assessment & Plan Note (Signed)
Chronic Regular exercise and healthy diet encouraged Lab Results  Component Value Date   LDLCALC 32 09/20/2020   Check lipid panel Continue Crestor 40 mg daily

## 2022-12-23 ENCOUNTER — Ambulatory Visit: Payer: Medicare Other | Attending: Cardiology | Admitting: Cardiology

## 2022-12-23 ENCOUNTER — Encounter: Payer: Self-pay | Admitting: Cardiology

## 2022-12-23 VITALS — BP 110/68 | Ht 70.0 in | Wt 218.6 lb

## 2022-12-23 DIAGNOSIS — R931 Abnormal findings on diagnostic imaging of heart and coronary circulation: Secondary | ICD-10-CM | POA: Diagnosis not present

## 2022-12-23 DIAGNOSIS — I10 Essential (primary) hypertension: Secondary | ICD-10-CM | POA: Insufficient documentation

## 2022-12-23 DIAGNOSIS — E785 Hyperlipidemia, unspecified: Secondary | ICD-10-CM | POA: Diagnosis not present

## 2022-12-23 NOTE — Patient Instructions (Signed)

## 2022-12-24 ENCOUNTER — Other Ambulatory Visit: Payer: Self-pay | Admitting: Physician Assistant

## 2022-12-26 ENCOUNTER — Ambulatory Visit (INDEPENDENT_AMBULATORY_CARE_PROVIDER_SITE_OTHER): Payer: Medicare Other

## 2022-12-26 ENCOUNTER — Encounter: Payer: Self-pay | Admitting: Internal Medicine

## 2022-12-26 DIAGNOSIS — E538 Deficiency of other specified B group vitamins: Secondary | ICD-10-CM | POA: Diagnosis not present

## 2022-12-26 MED ORDER — CYANOCOBALAMIN 1000 MCG/ML IJ SOLN
1000.0000 ug | Freq: Once | INTRAMUSCULAR | Status: AC
Start: 2022-12-26 — End: 2022-12-26
  Administered 2022-12-26: 1000 ug via INTRAMUSCULAR

## 2022-12-26 NOTE — Progress Notes (Signed)
Pt was given B12 injection w/o any complications. 

## 2022-12-30 ENCOUNTER — Other Ambulatory Visit: Payer: Self-pay

## 2022-12-30 MED ORDER — RYBELSUS 14 MG PO TABS: 14.0000 mg | ORAL_TABLET | Freq: Every day | ORAL | 2 refills | Status: DC

## 2023-01-16 ENCOUNTER — Telehealth: Payer: Self-pay | Admitting: Hematology and Oncology

## 2023-01-16 DIAGNOSIS — Z23 Encounter for immunization: Secondary | ICD-10-CM | POA: Diagnosis not present

## 2023-01-16 NOTE — Telephone Encounter (Signed)
Patient is aware of rescheduled appointment times/dates for lab appointment

## 2023-01-24 DIAGNOSIS — Z23 Encounter for immunization: Secondary | ICD-10-CM | POA: Diagnosis not present

## 2023-01-26 ENCOUNTER — Ambulatory Visit (INDEPENDENT_AMBULATORY_CARE_PROVIDER_SITE_OTHER): Payer: Medicare Other

## 2023-01-26 DIAGNOSIS — E538 Deficiency of other specified B group vitamins: Secondary | ICD-10-CM

## 2023-01-26 MED ORDER — CYANOCOBALAMIN 1000 MCG/ML IJ SOLN
1000.0000 ug | Freq: Once | INTRAMUSCULAR | Status: AC
Start: 2023-01-26 — End: 2023-01-26
  Administered 2023-01-26: 1000 ug via INTRAMUSCULAR

## 2023-01-26 NOTE — Progress Notes (Signed)
Pt was given B12 injection with no complications.

## 2023-02-03 ENCOUNTER — Other Ambulatory Visit: Payer: Self-pay | Admitting: Cardiology

## 2023-02-03 DIAGNOSIS — E7800 Pure hypercholesterolemia, unspecified: Secondary | ICD-10-CM

## 2023-02-03 DIAGNOSIS — R931 Abnormal findings on diagnostic imaging of heart and coronary circulation: Secondary | ICD-10-CM

## 2023-02-06 ENCOUNTER — Other Ambulatory Visit: Payer: Self-pay

## 2023-02-06 DIAGNOSIS — K229 Disease of esophagus, unspecified: Secondary | ICD-10-CM

## 2023-02-10 ENCOUNTER — Other Ambulatory Visit: Payer: Self-pay | Admitting: Cardiology

## 2023-02-10 DIAGNOSIS — I10 Essential (primary) hypertension: Secondary | ICD-10-CM

## 2023-02-20 ENCOUNTER — Other Ambulatory Visit: Payer: Medicare Other

## 2023-02-26 ENCOUNTER — Ambulatory Visit: Payer: Medicare Other

## 2023-02-26 DIAGNOSIS — E538 Deficiency of other specified B group vitamins: Secondary | ICD-10-CM | POA: Diagnosis not present

## 2023-02-26 MED ORDER — CYANOCOBALAMIN 1000 MCG/ML IJ SOLN
1000.0000 ug | Freq: Once | INTRAMUSCULAR | Status: AC
  Administered 2023-02-26: 1000 ug via INTRAMUSCULAR

## 2023-02-26 NOTE — Progress Notes (Signed)
Pt was given B12 injection with no complications.

## 2023-03-02 ENCOUNTER — Other Ambulatory Visit: Payer: Self-pay | Admitting: *Deleted

## 2023-03-02 ENCOUNTER — Other Ambulatory Visit: Payer: Self-pay | Admitting: Internal Medicine

## 2023-03-02 DIAGNOSIS — D649 Anemia, unspecified: Secondary | ICD-10-CM

## 2023-03-03 ENCOUNTER — Encounter (HOSPITAL_COMMUNITY): Payer: Self-pay | Admitting: Gastroenterology

## 2023-03-03 ENCOUNTER — Inpatient Hospital Stay: Payer: Medicare Other | Attending: Hematology and Oncology

## 2023-03-03 DIAGNOSIS — D649 Anemia, unspecified: Secondary | ICD-10-CM

## 2023-03-03 DIAGNOSIS — D509 Iron deficiency anemia, unspecified: Secondary | ICD-10-CM | POA: Insufficient documentation

## 2023-03-03 LAB — CBC WITH DIFFERENTIAL (CANCER CENTER ONLY)
Abs Immature Granulocytes: 0.01 10*3/uL (ref 0.00–0.07)
Basophils Absolute: 0 10*3/uL (ref 0.0–0.1)
Basophils Relative: 1 %
Eosinophils Absolute: 0.1 10*3/uL (ref 0.0–0.5)
Eosinophils Relative: 1 %
HCT: 33.4 % — ABNORMAL LOW (ref 39.0–52.0)
Hemoglobin: 11.5 g/dL — ABNORMAL LOW (ref 13.0–17.0)
Immature Granulocytes: 0 %
Lymphocytes Relative: 22 %
Lymphs Abs: 1.3 10*3/uL (ref 0.7–4.0)
MCH: 30.8 pg (ref 26.0–34.0)
MCHC: 34.4 g/dL (ref 30.0–36.0)
MCV: 89.5 fL (ref 80.0–100.0)
Monocytes Absolute: 0.4 10*3/uL (ref 0.1–1.0)
Monocytes Relative: 7 %
Neutro Abs: 4.1 10*3/uL (ref 1.7–7.7)
Neutrophils Relative %: 69 %
Platelet Count: 154 10*3/uL (ref 150–400)
RBC: 3.73 MIL/uL — ABNORMAL LOW (ref 4.22–5.81)
RDW: 13.8 % (ref 11.5–15.5)
WBC Count: 5.9 10*3/uL (ref 4.0–10.5)
nRBC: 0 % (ref 0.0–0.2)

## 2023-03-03 LAB — IRON AND IRON BINDING CAPACITY (CC-WL,HP ONLY)
Iron: 74 ug/dL (ref 45–182)
Saturation Ratios: 20 % (ref 17.9–39.5)
TIBC: 377 ug/dL (ref 250–450)
UIBC: 303 ug/dL (ref 117–376)

## 2023-03-03 LAB — CMP (CANCER CENTER ONLY)
ALT: 24 U/L (ref 0–44)
AST: 21 U/L (ref 15–41)
Albumin: 4.6 g/dL (ref 3.5–5.0)
Alkaline Phosphatase: 66 U/L (ref 38–126)
Anion gap: 8 (ref 5–15)
BUN: 21 mg/dL (ref 8–23)
CO2: 25 mmol/L (ref 22–32)
Calcium: 10.1 mg/dL (ref 8.9–10.3)
Chloride: 107 mmol/L (ref 98–111)
Creatinine: 1.55 mg/dL — ABNORMAL HIGH (ref 0.61–1.24)
GFR, Estimated: 47 mL/min — ABNORMAL LOW (ref >60)
Glucose, Bld: 170 mg/dL — ABNORMAL HIGH (ref 70–99)
Potassium: 4.1 mmol/L (ref 3.5–5.1)
Sodium: 140 mmol/L (ref 135–145)
Total Bilirubin: 0.5 mg/dL (ref <1.2)
Total Protein: 6.8 g/dL (ref 6.5–8.1)

## 2023-03-03 LAB — FERRITIN: Ferritin: 75 ng/mL (ref 24–336)

## 2023-03-03 LAB — RETIC PANEL
Immature Retic Fract: 15.5 % (ref 2.3–15.9)
RBC.: 3.79 MIL/uL — ABNORMAL LOW (ref 4.22–5.81)
Retic Count, Absolute: 53.8 10*3/uL (ref 19.0–186.0)
Retic Ct Pct: 1.4 % (ref 0.4–3.1)
Reticulocyte Hemoglobin: 32.9 pg (ref >27.9)

## 2023-03-03 NOTE — Progress Notes (Signed)
Attempted to obtain medical history for pre op call via telephone, unable to reach at this time. HIPAA compliant voicemail message left requesting return call to pre surgical testing department.

## 2023-03-09 ENCOUNTER — Encounter (HOSPITAL_COMMUNITY): Payer: Self-pay | Admitting: Gastroenterology

## 2023-03-09 ENCOUNTER — Ambulatory Visit (HOSPITAL_COMMUNITY): Payer: Medicare Other | Admitting: Anesthesiology

## 2023-03-09 ENCOUNTER — Encounter (HOSPITAL_COMMUNITY)
Admission: RE | Disposition: A | Discharge status: Home / Self Care | Payer: Self-pay | Source: Ambulatory Visit | Attending: Gastroenterology

## 2023-03-09 ENCOUNTER — Ambulatory Visit (HOSPITAL_COMMUNITY)
Admission: RE | Admit: 2023-03-09 | Discharge: 2023-03-09 | Disposition: A | Discharge status: Home / Self Care | Payer: Medicare Other | Source: Ambulatory Visit | Attending: Gastroenterology | Admitting: Gastroenterology

## 2023-03-09 ENCOUNTER — Other Ambulatory Visit: Payer: Self-pay

## 2023-03-09 DIAGNOSIS — Z79899 Other long term (current) drug therapy: Secondary | ICD-10-CM | POA: Diagnosis not present

## 2023-03-09 DIAGNOSIS — D649 Anemia, unspecified: Secondary | ICD-10-CM | POA: Diagnosis not present

## 2023-03-09 DIAGNOSIS — E119 Type 2 diabetes mellitus without complications: Secondary | ICD-10-CM | POA: Diagnosis not present

## 2023-03-09 DIAGNOSIS — K319 Disease of stomach and duodenum, unspecified: Secondary | ICD-10-CM | POA: Diagnosis not present

## 2023-03-09 DIAGNOSIS — D509 Iron deficiency anemia, unspecified: Secondary | ICD-10-CM | POA: Diagnosis not present

## 2023-03-09 DIAGNOSIS — K2950 Unspecified chronic gastritis without bleeding: Secondary | ICD-10-CM | POA: Insufficient documentation

## 2023-03-09 DIAGNOSIS — G4730 Sleep apnea, unspecified: Secondary | ICD-10-CM | POA: Insufficient documentation

## 2023-03-09 DIAGNOSIS — I10 Essential (primary) hypertension: Secondary | ICD-10-CM | POA: Insufficient documentation

## 2023-03-09 DIAGNOSIS — K2990 Gastroduodenitis, unspecified, without bleeding: Secondary | ICD-10-CM

## 2023-03-09 DIAGNOSIS — K2100 Gastro-esophageal reflux disease with esophagitis, without bleeding: Secondary | ICD-10-CM | POA: Diagnosis not present

## 2023-03-09 DIAGNOSIS — Z8249 Family history of ischemic heart disease and other diseases of the circulatory system: Secondary | ICD-10-CM | POA: Diagnosis not present

## 2023-03-09 DIAGNOSIS — Z7984 Long term (current) use of oral hypoglycemic drugs: Secondary | ICD-10-CM | POA: Diagnosis not present

## 2023-03-09 DIAGNOSIS — K229 Disease of esophagus, unspecified: Secondary | ICD-10-CM

## 2023-03-09 DIAGNOSIS — Z7985 Long-term (current) use of injectable non-insulin antidiabetic drugs: Secondary | ICD-10-CM | POA: Insufficient documentation

## 2023-03-09 DIAGNOSIS — K8689 Other specified diseases of pancreas: Secondary | ICD-10-CM | POA: Diagnosis not present

## 2023-03-09 DIAGNOSIS — K2289 Other specified disease of esophagus: Secondary | ICD-10-CM | POA: Insufficient documentation

## 2023-03-09 DIAGNOSIS — J45909 Unspecified asthma, uncomplicated: Secondary | ICD-10-CM | POA: Diagnosis not present

## 2023-03-09 DIAGNOSIS — K2970 Gastritis, unspecified, without bleeding: Secondary | ICD-10-CM | POA: Diagnosis not present

## 2023-03-09 HISTORY — PX: BIOPSY: SHX5522

## 2023-03-09 HISTORY — PX: ESOPHAGOGASTRODUODENOSCOPY (EGD) WITH PROPOFOL: SHX5813

## 2023-03-09 LAB — GLUCOSE, CAPILLARY: Glucose-Capillary: 128 mg/dL — ABNORMAL HIGH (ref 70–99)

## 2023-03-09 SURGERY — ESOPHAGOGASTRODUODENOSCOPY (EGD) WITH PROPOFOL
Anesthesia: Monitor Anesthesia Care

## 2023-03-09 MED ORDER — SODIUM CHLORIDE 0.9 % IV BOLUS
INTRAVENOUS | Status: AC | PRN
  Administered 2023-03-09: 500 mL via INTRAVENOUS

## 2023-03-09 MED ORDER — PROPOFOL 500 MG/50ML IV EMUL
INTRAVENOUS | Status: DC | PRN
  Administered 2023-03-09: 200 ug/kg/min via INTRAVENOUS
  Administered 2023-03-09 (×2): 30 mg via INTRAVENOUS

## 2023-03-09 MED ORDER — SODIUM CHLORIDE 0.9 % IV SOLN: INTRAVENOUS | Status: DC

## 2023-03-09 SURGICAL SUPPLY — 14 items
BLOCK BITE 60FR ADLT L/F BLUE (MISCELLANEOUS) ×3 IMPLANT
ELECT REM PT RETURN 9FT ADLT (ELECTROSURGICAL)
ELECTRODE REM PT RTRN 9FT ADLT (ELECTROSURGICAL) IMPLANT
FORCEP RJ3 GP 1.8X160 W-NEEDLE (CUTTING FORCEPS) IMPLANT
FORCEPS BIOP RAD 4 LRG CAP 4 (CUTTING FORCEPS) IMPLANT
NDL SCLEROTHERAPY 25GX240 (NEEDLE) IMPLANT
NEEDLE SCLEROTHERAPY 25GX240 (NEEDLE)
PROBE APC STR FIRE (PROBE) IMPLANT
PROBE INJECTION GOLD 7FR (MISCELLANEOUS) IMPLANT
SNARE SHORT THROW 13M SML OVAL (MISCELLANEOUS) IMPLANT
SYR 50ML LL SCALE MARK (SYRINGE) IMPLANT
TUBING ENDO SMARTCAP PENTAX (MISCELLANEOUS) ×6 IMPLANT
TUBING IRRIGATION ENDOGATOR (MISCELLANEOUS) ×3 IMPLANT
WATER STERILE IRR 1000ML POUR (IV SOLUTION) IMPLANT

## 2023-03-09 NOTE — Op Note (Signed)
[facility] Patient Name: Jonathon Armstrong Procedure Date: 03/09/2023 MRN: [id number] Attending MD: Napoleon Form , MD, [id number] Date of Birth: Jan 17, 1951 CSN: [id number] Age: 72 Admit Type: Outpatient Procedure:                Upper GI endoscopy Indications:              Gastroesophageal mucosal nodule, Follow-up of                            reflux esophagitis Providers:                Napoleon Form, MD, Suzy Bouchard, RN, Rhodia Albright, Technician, Faustina Mbumina, Technician Referring MD:              Medicines:                Monitored Anesthesia Care Complications:            No immediate complications. Estimated Blood Loss:     Estimated blood loss was minimal. Procedure:                Pre-Anesthesia Assessment:                           - Prior to the procedure, a History and Physical                            was performed, and patient medications and                            allergies were reviewed. The patient's tolerance of                            previous anesthesia was also reviewed. The risks                            and benefits of the procedure and the sedation                            options and risks were discussed with the patient.                            All questions were answered, and informed consent                            was obtained. Prior Anticoagulants: The patient has                            taken no anticoagulant or antiplatelet agents. ASA                            Grade Assessment: II - A patient with mild systemic  disease. After reviewing the risks and benefits,                            the patient was deemed in satisfactory condition to                            undergo the procedure.                           After obtaining informed consent, the endoscope was                            passed under direct vision. Throughout the                             procedure, the patient's blood pressure, pulse, and                            oxygen saturations were monitored continuously. The                            [id number]-H190 ([id number]) Olympus endoscope was introduced                            through the mouth, and advanced to the second part                            of duodenum. The upper GI endoscopy was                            accomplished without difficulty. The patient                            tolerated the procedure well. Scope In: Scope Out: Findings:      LA Grade B (one or more mucosal breaks greater than 5 mm, not extending       between the tops of two mucosal folds) esophagitis with no bleeding was       found 38 to 40 cm from the incisors. Biopsies were taken with a cold       forceps for histology.      There were esophageal mucosal changes suspicious for short-segment       Barrett's esophagus present at the gastroesophageal junction 38 to 40 cm       from the incisors. The maximum longitudinal extent of these mucosal       changes was 2 cm in length. Mucosa was biopsied with a cold forceps for       histology in a targeted manner at intervals of 1 cm at the       gastroesophageal junction. One specimen bottle was sent to pathology.      Patchy mild inflammation characterized by congestion (edema), erythema       and friability was found in the entire examined stomach. Biopsies were       taken with a cold forceps for histology.      The cardia and gastric fundus were normal on retroflexion.  The first portion of the duodenum and second portion of the duodenum       were normal. Impression:               - LA Grade B reflux esophagitis with no bleeding.                            Biopsied.                           - Esophageal mucosal changes suspicious for                            short-segment Barrett's esophagus. Biopsied.                           - Gastritis. Biopsied.                           -  Normal first portion of the duodenum and second                            portion of the duodenum. Moderate Sedation:      N/A Recommendation:           - Resume previous diet.                           - Continue present medications.                           - Await pathology results.                           - Follow an antireflux regimen.                           - Use Protonix (pantoprazole) 40 mg PO BID for 3                            months and then decrease to 40mg  daily.                           - Return to GI office in 4 months. Procedure Code(s):        --- Professional ---                           [id number], Esophagogastroduodenoscopy, flexible,                            transoral; with biopsy, single or multiple Diagnosis Code(s):        --- Professional ---                           K21.00, Gastro-esophageal reflux disease with                            esophagitis, without bleeding  K22.89, Other specified disease of esophagus                           K29.70, Gastritis, unspecified, without bleeding CPT copyright 2022 American Medical Association. All rights reserved. The codes documented in this report are preliminary and upon coder review may  be revised to meet current compliance requirements. Napoleon Form, MD 03/09/2023 12:18:52 PM This report has been signed electronically. Number of Addenda: 0

## 2023-03-09 NOTE — Transfer of Care (Signed)
Immediate Anesthesia Transfer of Care Note  Patient: Jonathon Armstrong  Procedure(s) Performed: ESOPHAGOGASTRODUODENOSCOPY (EGD) WITH PROPOFOL BIOPSY  Patient Location: PACU  Anesthesia Type:MAC  Level of Consciousness: sedated  Airway & Oxygen Therapy: Patient Spontanous Breathing  Post-op Assessment: Report given to RN  Post vital signs: Reviewed and stable  Last Vitals:  Vitals Value Taken Time  BP 102/44 03/09/23 1215  Temp    Pulse 59 03/09/23 1218  Resp 20 03/09/23 1218  SpO2 100 % 03/09/23 1218  Vitals shown include unfiled device data.  Last Pain:  Vitals:   03/09/23 1031  TempSrc: Temporal  PainSc: 0-No pain         Complications: No notable events documented.

## 2023-03-09 NOTE — Discharge Instructions (Signed)
YOU HAD AN ENDOSCOPIC PROCEDURE TODAY: Refer to the procedure report and other information in the discharge instructions given to you for any specific questions about what was found during the examination. If this information does not answer your questions, please call [place] office at [phone] to clarify.   YOU SHOULD EXPECT: Some feelings of bloating in the abdomen. Passage of more gas than usual. Walking can help get rid of the air that was put into your GI tract during the procedure and reduce the bloating. If you had a lower endoscopy (such as a colonoscopy or flexible sigmoidoscopy) you may notice spotting of blood in your stool or on the toilet paper. Some abdominal soreness may be present for a day or two, also.  DIET: Your first meal following the procedure should be a light meal and then it is ok to progress to your normal diet. A half-sandwich or bowl of soup is an example of a good first meal. Heavy or fried foods are harder to digest and may make you feel nauseous or bloated. Drink plenty of fluids but you should avoid alcoholic beverages for 24 hours. If you had a esophageal dilation, please see attached instructions for diet.    ACTIVITY: Your care partner should take you home directly after the procedure. You should plan to take it easy, moving slowly for the rest of the day. You can resume normal activity the day after the procedure however YOU SHOULD NOT DRIVE, use power tools, machinery or perform tasks that involve climbing or major physical exertion for 24 hours (because of the sedation medicines used during the test).   SYMPTOMS TO REPORT IMMEDIATELY: A gastroenterologist can be reached at any hour. Please call [phone]  for any of the following symptoms:  Following upper endoscopy (EGD, EUS, ERCP, esophageal dilation) Vomiting of blood or coffee ground material  New, significant abdominal pain  New, significant chest pain or pain under the shoulder blades  Painful or  persistently difficult swallowing  New shortness of breath  Black, tarry-looking or red, bloody stools  FOLLOW UP:  If any biopsies were taken you will be contacted by phone or by letter within the next 1-3 weeks. Call [phone]  if you have not heard about the biopsies in 3 weeks.  Please also call with any specific questions about appointments or follow up tests.

## 2023-03-09 NOTE — H&P (Signed)
[facility] History and Physical   Primary Care Physician:  Pincus Sanes, MD   Reason for Procedure:   Gastro esophageal junction mucosal nodule resection  Plan:    EGD with EMR     HPI: Jonathon Armstrong is a 72 y.o. male here for EGD with EMR /biopsies of gastro esophageal mucosal nodule.  The risks and benefits as well as alternatives of endoscopic procedure(s) have been discussed and reviewed. All questions answered. The patient agrees to proceed.    Past Medical History:  Diagnosis Date   Allergy    seasonal   Asthma 04-03-18   Asystole (HCC)    "vagal response"post nasal polypectomy   Complication of anesthesia Apr 03, 2005   Coded x2 after nasal polyp removal (Believes vagal nerve response)   Diabetes mellitus without complication (HCC)    pre but take metformin   Diverticulosis of colon    FH: colonic polyps    hx of   Hyperlipidemia    Hypertension    Mitral regurgitation    mild & aortic Valve calcification on 2d echo;sbe prophylaxis   Parotitis, acute 04-03-12   Seasonal rhinitis    Sleep apnea    CPAP    Past Surgical History:  Procedure Laterality Date   APPENDECTOMY     asytole post nasal polypectomy  03/24/2006   colonoscopy with polypectomy  03/24/2008   Dr Juanda Chance   ESOPHAGOGASTRODUODENOSCOPY (EGD) WITH PROPOFOL N/A 11/10/2022   Procedure: ESOPHAGOGASTRODUODENOSCOPY (EGD) WITH PROPOFOL;  Surgeon: Napoleon Form, MD;  Location: [facility];  Service: Gastroenterology;  Laterality: N/A;   HEMOSTASIS CLIP PLACEMENT  11/10/2022   Procedure: HEMOSTASIS CLIP PLACEMENT;  Surgeon: Napoleon Form, MD;  Location: [facility];  Service: Gastroenterology;;   heriorraphy     x2   HERNIA REPAIR     INTERCOSTAL NERVE BLOCK Left 08/16/2019   Procedure: Intercostal Nerve Block;  Surgeon: Corliss Skains, MD;  Location: [facility] OR;  Service: Thoracic;  Laterality: Left;   LUNG LOBECTOMY Left    LLL   MIDDLE EAR SURGERY     TM replacement   NASAL  SINUS SURGERY  1994, [id number]   X3   NODE DISSECTION  08/16/2019   Procedure: Node Dissection;  Surgeon: Corliss Skains, MD;  Location: [facility] OR;  Service: Thoracic;;   POLYPECTOMY  11/10/2022   Procedure: POLYPECTOMY;  Surgeon: Napoleon Form, MD;  Location: [facility];  Service: Gastroenterology;;   TONSILLECTOMY AND ADENOIDECTOMY     VIDEO BRONCHOSCOPY N/A 08/16/2019   Procedure: VIDEO BRONCHOSCOPY;  Surgeon: Corliss Skains, MD;  Location: [facility] OR;  Service: Thoracic;  Laterality: N/A;    Prior to Admission medications   Medication Sig Start Date End Date Taking? Authorizing Provider  aspirin EC 81 MG tablet Take 1 tablet (81 mg total) by mouth daily. Swallow whole. 06/21/20  Yes Lewayne Bunting, MD  Cholecalciferol (VITAMIN D-3 PO) Take by mouth daily.   Yes [provider]  Cyanocobalamin (VITAMIN B-12 IJ) Inject 1,000 mcg as directed every 30 (thirty) days.   Yes [provider]  labetalol (NORMODYNE) 200 MG tablet Take 1 tablet (200 mg total) by mouth 2 (two) times daily. Must keep upcoming appointment for additional refills 02/11/23  Yes Crenshaw, Madolyn Frieze, MD  metFORMIN (GLUCOPHAGE) 500 MG tablet TAKE TWO TABLETS BY MOUTH TWICE DAILY WITH FOOD 03/03/23  Yes Burns, Bobette Mo, MD  rosuvastatin (CRESTOR) 40 MG tablet Take 1 tablet (40 mg total) by mouth daily. 02/04/23 05/05/23  Yes Lewayne Bunting, MD  spironolactone (ALDACTONE) 25 MG tablet TAKE ONE TABLET ONCE DAILY 08/20/22  Yes Burns, Bobette Mo, MD  telmisartan (MICARDIS) 80 MG tablet Take 1 tablet (80 mg total) by mouth daily. 02/11/23  Yes Lewayne Bunting, MD  empagliflozin (JARDIANCE) 25 MG TABS tablet Take 1 tablet (25 mg total) by mouth daily. 12/22/22   Pincus Sanes, MD  FEROSUL 325 (65 Fe) MG tablet TAKE ONE TABLET BY MOUTH DAILY WITH BREAKFAST 12/24/22   Jaci Standard, MD  Omega-3 Fatty Acids (FISH OIL PO) Take by mouth.    [provider]  pantoprazole (PROTONIX) 40 MG tablet Take  1 tablet (40 mg total) by mouth 2 (two) times daily before a meal. 07/04/22   Amadou Katzenstein, Eleonore Chiquito, MD  Semaglutide (RYBELSUS) 14 MG TABS Take 1 tablet (14 mg total) by mouth daily. 12/30/22   Burns, Bobette Mo, MD  Semaglutide (RYBELSUS) 7 MG TABS TAKE 1 TABLET BY MOUTH EVERY DAY. FOLLOW-UP APPOINTMENT IS DUE 11/20/22   Pincus Sanes, MD    Current Facility-Administered Medications  Medication Dose Route Frequency Provider Last Rate Last Admin   0.9 %  sodium chloride infusion   Intravenous Continuous Raziyah Vanvleck, Eleonore Chiquito, MD        Allergies as of 02/06/2023 - Review Complete 12/23/2022  Allergen Reaction Noted   Diltiazem hcl Cough 01/19/2018   Diltiazem hcl Other (See Comments) and Cough    Penicillins      Family History  Problem Relation Age of Onset   Cancer Mother        liver &multiple myeloma   Heart disease Mother        CABG, Angina   Hyperlipidemia Mother    Hypertension Father    Heart disease Father        MI in 73"s   Dementia Father    COPD Sister    Other Sister        perforated bowel   Other Sister        cns aneurysm   Cancer Maternal Grandfather        bone   Diabetes Neg Hx    Stroke Neg Hx    Colon cancer Neg Hx     Social History   Socioeconomic History   Marital status: Married    Spouse name: Not on file   Number of children: 2   Years of education: 18   Highest education level: Master's degree (e.g., MA, MS, MEng, MEd, MSW, MBA)  Occupational History   Occupation: Retired    Associate Professor: RETIRED  Tobacco Use   Smoking status: Never   Smokeless tobacco: Never  Vaping Use   Vaping status: Never Used  Substance and Sexual Activity   Alcohol use: Yes    Alcohol/week: 2.0 standard drinks of alcohol    Types: 1 Glasses of wine, 1 Shots of liquor per week    Comment: socially < 2/ week   Drug use: No   Sexual activity: Yes  Other Topics Concern   Not on file  Social History Narrative   Fun: Water, swimming, [employer]; Volunteer work     Denies abuse and feels safe at home.   Regular exercise      Social Drivers of Health   Financial Resource Strain: Low Risk  (12/15/2022)   Overall Financial Resource Strain (CARDIA)    Difficulty of Paying Living Expenses: Not hard at all  Food Insecurity: No Food Insecurity (12/15/2022)  Hunger Vital Sign    Worried About Running Out of Food in the Last Year: Never true    Ran Out of Food in the Last Year: Never true  Transportation Needs: No Transportation Needs (12/15/2022)   PRAPARE - Administrator, Civil Service (Medical): No    Lack of Transportation (Non-Medical): No  Physical Activity: Sufficiently Active (12/15/2022)   Exercise Vital Sign    Days of Exercise per Week: 3 days    Minutes of Exercise per Session: 60 min  Stress: No Stress Concern Present (12/15/2022)   [employer] of Occupational Health - Occupational Stress Questionnaire    Feeling of Stress : Not at all  Social Connections: Socially Integrated (12/15/2022)   Social Connection and Isolation Panel [NHANES]    Frequency of Communication with Friends and Family: More than three times a week    Frequency of Social Gatherings with Friends and Family: More than three times a week    Attends Religious Services: More than 4 times per year    Active Member of [employer] or Organizations: Yes    Attends Engineer, structural: More than 4 times per year    Marital Status: Married  Catering manager Violence: Not At Risk (05/29/2021)   Humiliation, Afraid, Rape, and Kick questionnaire    Fear of Current or Ex-Partner: No    Emotionally Abused: No    Physically Abused: No    Sexually Abused: No    Review of Systems:  All other review of systems negative except as mentioned in the HPI.  Physical Exam: Vital signs in last 24 hours: Temp:  [97.8 F (36.6 C)] 97.8 F (36.6 C) (12/16 1031) Pulse Rate:  [60] 60 (12/16 1031) Resp:  [12] 12 (12/16 1031) BP: (141)/(82) 141/82 (12/16 1031) SpO2:  [99  %] 99 % (12/16 1031) Weight:  [97.5 kg] 97.5 kg (12/16 1031)   General:   Alert, NAD Lungs:  Clear .   Heart:  Regular rate and rhythm Abdomen:  Soft, nontender and nondistended. Neuro/Psych:  Alert and cooperative. Normal mood and affect. A and O x 3   K. Scherry Ran , MD [phone]

## 2023-03-09 NOTE — Anesthesia Preprocedure Evaluation (Addendum)
Anesthesia Evaluation  Patient identified by MRN, date of birth, ID band Patient awake    Reviewed: Allergy & Precautions, NPO status , Patient's Chart, lab work & pertinent test results  Airway Mallampati: II  TM Distance: >3 FB Neck ROM: Full    Dental no notable dental hx.    Pulmonary asthma , sleep apnea and Continuous Positive Airway Pressure Ventilation    Pulmonary exam normal        Cardiovascular hypertension, Pt. on home beta blockers and Pt. on medications  Rhythm:Regular Rate:Normal     Neuro/Psych negative neurological ROS  negative psych ROS   GI/Hepatic Neg liver ROS,GERD  Medicated,,  Endo/Other  diabetes, Type 2, Insulin Dependent, Oral Hypoglycemic Agents    Renal/GU   negative genitourinary   Musculoskeletal   Abdominal Normal abdominal exam  (+)   Peds  Hematology  (+) Blood dyscrasia, anemia Lab Results      Component                Value               Date                      WBC                      5.9                 03/03/2023                HGB                      11.5 (L)            03/03/2023                HCT                      33.4 (L)            03/03/2023                MCV                      89.5                03/03/2023                PLT                      154                 03/03/2023              Anesthesia Other Findings   Reproductive/Obstetrics                             Anesthesia Physical Anesthesia Plan  ASA: 3  Anesthesia Plan: MAC   Post-op Pain Management:    Induction: Intravenous  PONV Risk Score and Plan: Propofol infusion and Treatment may vary due to age or medical condition  Airway Management Planned: Simple Face Mask and Nasal Cannula  Additional Equipment: None  Intra-op Plan:   Post-operative Plan:   Informed Consent: I have reviewed the patients History and Physical, chart, labs and discussed the  procedure including the risks, benefits and alternatives for the proposed anesthesia  with the patient or authorized representative who has indicated his/her understanding and acceptance.     Dental advisory given  Plan Discussed with:   Anesthesia Plan Comments:        Anesthesia Quick Evaluation

## 2023-03-10 LAB — SURGICAL PATHOLOGY

## 2023-03-10 NOTE — Anesthesia Postprocedure Evaluation (Signed)
Anesthesia Post Note  Patient: Travez Yaccarino [place]akenbush  Procedure(s) Performed: ESOPHAGOGASTRODUODENOSCOPY (EGD) WITH PROPOFOL BIOPSY     Patient location during evaluation: PACU Anesthesia Type: MAC Level of consciousness: awake and alert Pain management: pain level controlled Vital Signs Assessment: post-procedure vital signs reviewed and stable Respiratory status: spontaneous breathing, nonlabored ventilation, respiratory function stable and patient connected to nasal cannula oxygen Cardiovascular status: stable and blood pressure returned to baseline Postop Assessment: no apparent nausea or vomiting Anesthetic complications: no   No notable events documented.  Last Vitals:  Vitals:   03/09/23 1242 03/09/23 1250  BP: 119/67 116/68  Pulse: (!) 57 (!) 57  Resp: 15 19  Temp:    SpO2: 97% 98%    Last Pain:  Vitals:   03/09/23 1236  TempSrc:   PainSc: 0-No pain                 Earl Lites P Tobie Perdue

## 2023-03-12 ENCOUNTER — Encounter (HOSPITAL_COMMUNITY): Payer: Self-pay | Admitting: Gastroenterology

## 2023-03-19 ENCOUNTER — Encounter: Payer: Self-pay | Admitting: Gastroenterology

## 2023-03-20 ENCOUNTER — Other Ambulatory Visit: Payer: Self-pay | Admitting: Internal Medicine

## 2023-03-23 ENCOUNTER — Ambulatory Visit: Payer: Medicare Other

## 2023-03-30 ENCOUNTER — Ambulatory Visit (INDEPENDENT_AMBULATORY_CARE_PROVIDER_SITE_OTHER): Payer: Medicare Other

## 2023-03-30 DIAGNOSIS — E538 Deficiency of other specified B group vitamins: Secondary | ICD-10-CM

## 2023-03-30 MED ORDER — CYANOCOBALAMIN 1000 MCG/ML IJ SOLN
1000.0000 ug | Freq: Once | INTRAMUSCULAR | Status: AC
  Administered 2023-03-30: 1000 ug via INTRAMUSCULAR

## 2023-03-30 NOTE — Progress Notes (Signed)
 Patient visits today for their b-12 injection. Patient informed of what they had received and tolerated injection well. Patient informed to reach back out to the office if needed.

## 2023-04-05 ENCOUNTER — Other Ambulatory Visit: Payer: Self-pay | Admitting: Internal Medicine

## 2023-04-26 DIAGNOSIS — R509 Fever, unspecified: Secondary | ICD-10-CM | POA: Diagnosis not present

## 2023-04-26 DIAGNOSIS — R051 Acute cough: Secondary | ICD-10-CM | POA: Diagnosis not present

## 2023-04-26 DIAGNOSIS — J189 Pneumonia, unspecified organism: Secondary | ICD-10-CM | POA: Diagnosis not present

## 2023-04-28 ENCOUNTER — Other Ambulatory Visit: Payer: Self-pay | Admitting: Acute Care

## 2023-04-28 ENCOUNTER — Ambulatory Visit (INDEPENDENT_AMBULATORY_CARE_PROVIDER_SITE_OTHER): Payer: Medicare Other | Admitting: Acute Care

## 2023-04-28 ENCOUNTER — Encounter: Payer: Self-pay | Admitting: Acute Care

## 2023-04-28 VITALS — BP 118/72 | HR 72 | Temp 98.6°F | Ht 68.5 in | Wt 210.0 lb

## 2023-04-28 DIAGNOSIS — Z85118 Personal history of other malignant neoplasm of bronchus and lung: Secondary | ICD-10-CM

## 2023-04-28 DIAGNOSIS — J069 Acute upper respiratory infection, unspecified: Secondary | ICD-10-CM

## 2023-04-28 DIAGNOSIS — R0600 Dyspnea, unspecified: Secondary | ICD-10-CM

## 2023-04-28 DIAGNOSIS — J111 Influenza due to unidentified influenza virus with other respiratory manifestations: Secondary | ICD-10-CM

## 2023-04-28 MED ORDER — HYDROCODONE BIT-HOMATROP MBR 5-1.5 MG/5ML PO SOLN: 5.0000 mL | Freq: Four times a day (QID) | ORAL | 0 refills | Status: DC | PRN

## 2023-04-28 MED ORDER — ALBUTEROL SULFATE HFA 108 (90 BASE) MCG/ACT IN AERS: 2.0000 | INHALATION_SPRAY | Freq: Four times a day (QID) | RESPIRATORY_TRACT | 2 refills | Status: DC | PRN

## 2023-04-28 NOTE — Progress Notes (Signed)
 History of Present Illness Jonathon Armstrong is a 73 y.o. male never smoker with past medical history of diabetes, hypertension, hyperlipidemia.  Had a CT coronary of the heart which revealed a lower lobe pulmonary nodule. This nodule had low grade PET avidity and patient had bronchoscopy, robotic assisted left wedge resection and  left lower lobectomy 07/2019. Biopsy was positive for stage I adenocarcinoma. He is currently followed by Dr. Shyrl with surveillance CT's . He was seen by Dr. Brenna during his lung nodule work up. Last seen in our office 10/04/2021 for acute cough.  He is here today for an acute visit for diagnosis of flu on 04/25/2023.   04/28/2023 Pt. Presents for acute visit. He states he started to have upper respiratory and flu like symptoms 04/24/2023 at lunch time. He had a scratchy throat. His symptoms worsened overnight, and he had body aches, cough. Had low grade fever Saturday 2/1. Sunday 2/2 he woke up and realized he needed to go to urgent care . He had done a Covid Test Sunday prior to Urgent care visit ,which was negative. He had the flu vaccine in October. He had a CXR, and Flu test. He was positive for flu and CXR showed Streaky right basilar opacities, likely represents atelectasis or aspiration, with superimposed infection not excluded. He was treated with Doxycycline and a prednisone  taper . He states he is much improved  with the fever resolving by Sunday afternoon. However, he continues to experience a cough, which he attributes more to sinus drainage rather than pulmonary issues. Last day with fever was 04/26/2023. Cough is better  but not gone. We will send in a prescription for Hycodan cough syrup to use as needed, as cough has been keeping him awake at night. We discussed isolation for 5 days after symptoms started, which will be until this coming Wednesday.   He has regular surveillance screening through oncology as follow up for his adenocarcinoma.  Test  Results: CXR 04/26/2023 Streaky right basilar opacities, likely represents atelectasis or aspiration, with superimposed infection not excluded.      Latest Ref Rng & Units 03/03/2023    1:30 PM 12/22/2022   11:28 AM 11/20/2022   10:25 AM  CBC  WBC 4.0 - 10.5 K/uL 5.9  5.2  5.9   Hemoglobin 13.0 - 17.0 g/dL 88.4  88.2  88.8   Hematocrit 39.0 - 52.0 % 33.4  35.3  32.3   Platelets 150 - 400 K/uL 154  170.0  145        Latest Ref Rng & Units 03/03/2023    1:30 PM 12/22/2022   11:28 AM 11/20/2022   10:25 AM  BMP  Glucose 70 - 99 mg/dL 829  834  817   BUN 8 - 23 mg/dL 21  23  26    Creatinine 0.61 - 1.24 mg/dL 8.44  8.65  8.53   Sodium 135 - 145 mmol/L 140  141  139   Potassium 3.5 - 5.1 mmol/L 4.1  4.4  4.7   Chloride 98 - 111 mmol/L 107  107  106   CO2 22 - 32 mmol/L 25  23  25    Calcium  8.9 - 10.3 mg/dL 89.8  9.9  9.9     BNP No results found for: BNP  ProBNP No results found for: PROBNP  PFT    Component Value Date/Time   FEV1PRE 2.47 08/12/2019 1447   FEV1POST 2.67 08/12/2019 1447   FVCPRE 3.13 08/12/2019 1447   FVCPOST  3.31 08/12/2019 1447   TLC 5.67 08/12/2019 1447   DLCOUNC 21.81 08/12/2019 1447   PREFEV1FVCRT 79 08/12/2019 1447   PSTFEV1FVCRT 81 08/12/2019 1447    No results found.   Past medical hx Past Medical History:  Diagnosis Date   Allergy    seasonal   Asthma 2018/03/12   Asystole (HCC)    vagal responsepost nasal polypectomy   Complication of anesthesia 12-Mar-2005   Coded x2 after nasal polyp removal (Believes vagal nerve response)   Diabetes mellitus without complication (HCC)    pre but take metformin    Diverticulosis of colon    FH: colonic polyps    hx of   Hyperlipidemia    Hypertension    Mitral regurgitation    mild & aortic Valve calcification on 2d echo;sbe prophylaxis   Parotitis, acute 03/12/2012   Seasonal rhinitis    Sleep apnea    CPAP     Social History   Tobacco Use   Smoking status: Never   Smokeless tobacco: Never  Vaping  Use   Vaping status: Never Used  Substance Use Topics   Alcohol use: Yes    Alcohol/week: 2.0 standard drinks of alcohol    Types: 1 Glasses of wine, 1 Shots of liquor per week    Comment: socially < 2/ week   Drug use: No    Mr.Ma reports that he has never smoked. He has never used smokeless tobacco. He reports current alcohol use of about 2.0 standard drinks of alcohol per week. He reports that he does not use drugs.  Tobacco Cessation: Never smoker    Past surgical hx, Family hx, Social hx all reviewed.  Current Outpatient Medications on File Prior to Visit  Medication Sig   aspirin  EC 81 MG tablet Take 1 tablet (81 mg total) by mouth daily. Swallow whole.   Cholecalciferol (VITAMIN D-3 PO) Take by mouth daily.   Cyanocobalamin  (VITAMIN B-12 IJ) Inject 1,000 mcg as directed every 30 (thirty) days.   doxycycline (VIBRAMYCIN) 100 MG capsule Take 100 mg by mouth 2 (two) times daily.   empagliflozin  (JARDIANCE ) 25 MG TABS tablet Take 1 tablet (25 mg total) by mouth daily.   FEROSUL 325 (65 Fe) MG tablet TAKE ONE TABLET BY MOUTH DAILY WITH BREAKFAST   labetalol  (NORMODYNE ) 200 MG tablet Take 1 tablet (200 mg total) by mouth 2 (two) times daily. Must keep upcoming appointment for additional refills   metFORMIN  (GLUCOPHAGE ) 500 MG tablet TAKE TWO TABLETS BY MOUTH TWICE DAILY WITH FOOD   Omega-3 Fatty Acids (FISH OIL PO) Take by mouth.   pantoprazole  (PROTONIX ) 40 MG tablet Take 1 tablet (40 mg total) by mouth 2 (two) times daily before a meal.   predniSONE  (DELTASONE ) 20 MG tablet Take 40 mg by mouth daily.   rosuvastatin  (CRESTOR ) 40 MG tablet Take 1 tablet (40 mg total) by mouth daily.   RYBELSUS  14 MG TABS TAKE 1 TABLET(14 MG) BY MOUTH DAILY   spironolactone  (ALDACTONE ) 25 MG tablet TAKE ONE TABLET ONCE DAILY   telmisartan  (MICARDIS ) 80 MG tablet Take 1 tablet (80 mg total) by mouth daily.   No current facility-administered medications on file prior to visit.     Allergies   Allergen Reactions   Diltiazem Hcl Cough   Diltiazem Hcl Other (See Comments) and Cough    ? symptoms   Penicillins     Rash with Amoxicillin  with Mono in college    Review Of Systems:  Constitutional:   No  weight loss, night sweats,  +Fevers, chills, fatigue, or  lassitude.  HEENT:   No headaches,  Difficulty swallowing,  Tooth/dental problems,   + Sore throat,                No sneezing, itching, ear ache, +nasal congestion, +post nasal drip,   CV:  No chest pain,  Orthopnea, PND, swelling in lower extremities, anasarca, dizziness, palpitations, syncope.   GI  No heartburn, indigestion, abdominal pain, nausea, vomiting, diarrhea, change in bowel habits, loss of appetite, bloody stools.   Resp: No shortness of breath with exertion or at rest.  No excess mucus, + productive cough,  No non-productive cough,  No coughing up of blood.  No change in color of mucus.  No wheezing.  No chest wall deformity  Skin: no rash or lesions.  GU: no dysuria, change in color of urine, no urgency or frequency.  No flank pain, no hematuria   MS:  No joint pain or swelling.  No decreased range of motion.  No back pain.+ Body aches  Psych:  No change in mood or affect. No depression or anxiety.  No memory loss.   Vital Signs BP 118/72   Pulse 72   Temp 98.6 F (37 C) (Oral)   Ht 5' 8.5 (1.74 m)   Wt 210 lb (95.3 kg)   SpO2 97%   BMI 31.47 kg/m    Physical Exam:  General- No distress,  A&Ox3, pleasant ENT: No sinus tenderness, TM clear, pale nasal mucosa, no oral exudate,no post nasal drip, no LAN Cardiac: S1, S2, regular rate and rhythm, no murmur Chest: No wheeze/ rales/ dullness; no accessory muscle use, no nasal flaring, no sternal retractions Abd.: Soft Non-tender, ND, BS +, Body mass index is 31.47 kg/m.  Ext: No clubbing cyanosis, edema Neuro:  normal strength, MAE x 4, A&O x 3 Skin: No rashes, warm and dry, No obvious lesions  Psych: normal mood and  behavior   Assessment/Plan Respiratory Infection Presented with cough, sore throat, and low-grade fever starting on 04/24/2023. Diagnosed with respiratory infection at urgent care on 04/26/2023. Chest X-ray showed streaky right basilar opacity. Currently on prednisone  and doxycycline with noted improvement. Still experiencing cough, possibly due to postnasal drip. -Continue and finish prednisone  and doxycycline as prescribed. -Prescribe Hycodan for cough. -Do not drive if sleepy -Prescribe Albuterol  inhaler for use as needed for coughing spasms. -Order chest X-ray in 4 weeks to ensure resolution of opacity. -Follow up 06/09/2023 as scheduled -Call sooner if needed.   History of Lung Cancer Patient had wedge resection for adenocarcinoma. No current symptoms or concerns related to this history. - Continue surveillance CT imaging per Dr. Shyrl  General Health Maintenance -Advise patient to isolate until fever has been gone for 24 hours and symptoms have improved, typically 5 days from onset.  I spent 35 minutes dedicated to the care of this patient on the date of this encounter to include pre-visit review of records, face-to-face time with the patient discussing conditions above, post visit ordering of testing, clinical documentation with the electronic health record, making appropriate referrals as documented, and communicating necessary information to the patient's healthcare team.   Lauraine JULIANNA Lites, NP 04/28/2023  10:11 AM

## 2023-04-28 NOTE — Patient Instructions (Addendum)
 It is good to see you today. I am glad you are feeling better. Finish your Doxycycline and Prednisone  taper. I have sent in prescriptions for Hycodan Cough syrup and renewed your Albuterol  inhaler.  Do not drive if you have taken the hycodan cough syrup. Do not drive if sleepy.  We will do a CXR and follow up OV  in 1 month to ensure you are better.  Keep the 06/23/2023 appointment . Call if you do not continue to improve. Please contact office for sooner follow up if symptoms do not improve or worsen or seek emergency care

## 2023-05-05 ENCOUNTER — Ambulatory Visit (INDEPENDENT_AMBULATORY_CARE_PROVIDER_SITE_OTHER): Payer: Medicare Other | Admitting: Acute Care

## 2023-05-05 ENCOUNTER — Other Ambulatory Visit: Payer: Self-pay

## 2023-05-05 ENCOUNTER — Encounter: Payer: Self-pay | Admitting: Acute Care

## 2023-05-05 VITALS — BP 98/64 | HR 67 | Temp 98.1°F | Ht 68.5 in | Wt 203.8 lb

## 2023-05-05 DIAGNOSIS — G9331 Postviral fatigue syndrome: Secondary | ICD-10-CM | POA: Diagnosis not present

## 2023-05-05 DIAGNOSIS — Z85118 Personal history of other malignant neoplasm of bronchus and lung: Secondary | ICD-10-CM

## 2023-05-05 DIAGNOSIS — R062 Wheezing: Secondary | ICD-10-CM | POA: Diagnosis not present

## 2023-05-05 DIAGNOSIS — J189 Pneumonia, unspecified organism: Secondary | ICD-10-CM

## 2023-05-05 MED ORDER — PREDNISONE 10 MG PO TABS: ORAL_TABLET | ORAL | 0 refills | Status: DC

## 2023-05-05 NOTE — Progress Notes (Signed)
History of Present Illness Jonathon Armstrong is a 73 y.o. male never smoker with past medical history of diabetes, hypertension, hyperlipidemia.  Had a CT coronary of the heart which revealed a lower lobe pulmonary nodule. This nodule had low grade PET avidity and patient had bronchoscopy, robotic assisted left wedge resection and  left lower lobectomy 07/2019. Biopsy was positive for stage I adenocarcinoma. He is currently followed by Dr. Cliffton Asters with surveillance CT's . He was seen by Dr. Tonia Brooms during his lung nodule work up. Last seen in our office 10/04/2021 for acute cough.  He was seen in our office for an acute visit after  diagnosis of flu on 04/28/2023.He was treated with Doxycycline and prednisone . He presents again today for an acute visit for slow to resolve symptoms.     05/05/2023 Pt. Presents for acute visit. He states he completed his Doxycycline and Prednisone taper that was prescribed by his urgent care clinic. Pt states he feels he is not getting better from the flu. He is wheezing, and his cough has increased, he has post nasal drip. No fever or body aches, but + fatigue. He feels this is post viral . He states the sinus drainage is clear for the most part.  As he is not displaying signs of infection, we will retreat with prednisone taper for his wheezing , and follow up with CXR in 3-4 weeks to ensure improvement. I have asked him to call if he develops fever, worsening shortness of breath,  or has any change in color of secretions. He has verbalized understanding.  Test Results: CXR 04/26/2023 Streaky right basilar opacities, likely represents atelectasis or aspiration, with superimposed infection not excluded.     Latest Ref Rng & Units 03/03/2023    1:30 PM 12/22/2022   11:28 AM 11/20/2022   10:25 AM  CBC  WBC 4.0 - 10.5 K/uL 5.9  5.2  5.9   Hemoglobin 13.0 - 17.0 g/dL 16.1  09.6  04.5   Hematocrit 39.0 - 52.0 % 33.4  35.3  32.3   Platelets 150 - 400 K/uL 154  170.0  145         Latest Ref Rng & Units 03/03/2023    1:30 PM 12/22/2022   11:28 AM 11/20/2022   10:25 AM  BMP  Glucose 70 - 99 mg/dL 409  811  914   BUN 8 - 23 mg/dL 21  23  26    Creatinine 0.61 - 1.24 mg/dL 7.82  9.56  2.13   Sodium 135 - 145 mmol/L 140  141  139   Potassium 3.5 - 5.1 mmol/L 4.1  4.4  4.7   Chloride 98 - 111 mmol/L 107  107  106   CO2 22 - 32 mmol/L 25  23  25    Calcium 8.9 - 10.3 mg/dL 08.6  9.9  9.9     BNP No results found for: "BNP"  ProBNP No results found for: "PROBNP"  PFT    Component Value Date/Time   FEV1PRE 2.47 08/12/2019 1447   FEV1POST 2.67 08/12/2019 1447   FVCPRE 3.13 08/12/2019 1447   FVCPOST 3.31 08/12/2019 1447   TLC 5.67 08/12/2019 1447   DLCOUNC 21.81 08/12/2019 1447   PREFEV1FVCRT 79 08/12/2019 1447   PSTFEV1FVCRT 81 08/12/2019 1447    No results found.   Past medical hx Past Medical History:  Diagnosis Date   Allergy    seasonal   Asthma 2019   Asystole (HCC)    "vagal  response"post nasal polypectomy   Complication of anesthesia May 17, 2004   Coded x2 after nasal polyp removal (Believes vagal nerve response)   Diabetes mellitus without complication (HCC)    pre but take metformin   Diverticulosis of colon    FH: colonic polyps    hx of   Hyperlipidemia    Hypertension    Mitral regurgitation    mild & aortic Valve calcification on 2d echo;sbe prophylaxis   Parotitis, acute 18-May-2011   Seasonal rhinitis    Sleep apnea    CPAP     Social History   Tobacco Use   Smoking status: Never   Smokeless tobacco: Never  Vaping Use   Vaping status: Never Used  Substance Use Topics   Alcohol use: Yes    Alcohol/week: 2.0 standard drinks of alcohol    Types: 1 Glasses of wine, 1 Shots of liquor per week    Comment: socially < 2/ week   Drug use: No    Mr.Nairn reports that he has never smoked. He has never used smokeless tobacco. He reports current alcohol use of about 2.0 standard drinks of alcohol per week. He reports that he does  not use drugs.  Tobacco Cessation: Never smoker    Past surgical hx, Family hx, Social hx all reviewed.  Current Outpatient Medications on File Prior to Visit  Medication Sig   albuterol (VENTOLIN HFA) 108 (90 Base) MCG/ACT inhaler Inhale 2 puffs into the lungs every 6 (six) hours as needed for wheezing or shortness of breath.   aspirin EC 81 MG tablet Take 1 tablet (81 mg total) by mouth daily. Swallow whole.   Cholecalciferol (VITAMIN D-3 PO) Take by mouth daily.   Cyanocobalamin (VITAMIN B-12 IJ) Inject 1,000 mcg as directed every 30 (thirty) days.   doxycycline (VIBRAMYCIN) 100 MG capsule Take 100 mg by mouth 2 (two) times daily.   empagliflozin (JARDIANCE) 25 MG TABS tablet Take 1 tablet (25 mg total) by mouth daily.   FEROSUL 325 (65 Fe) MG tablet TAKE ONE TABLET BY MOUTH DAILY WITH BREAKFAST   HYDROcodone bit-homatropine (HYCODAN) 5-1.5 MG/5ML syrup Take 5 mLs by mouth every 6 (six) hours as needed for cough.   labetalol (NORMODYNE) 200 MG tablet Take 1 tablet (200 mg total) by mouth 2 (two) times daily. Must keep upcoming appointment for additional refills   metFORMIN (GLUCOPHAGE) 500 MG tablet TAKE TWO TABLETS BY MOUTH TWICE DAILY WITH FOOD   Omega-3 Fatty Acids (FISH OIL PO) Take by mouth.   pantoprazole (PROTONIX) 40 MG tablet Take 1 tablet (40 mg total) by mouth 2 (two) times daily before a meal.   rosuvastatin (CRESTOR) 40 MG tablet Take 1 tablet (40 mg total) by mouth daily.   RYBELSUS 14 MG TABS TAKE 1 TABLET(14 MG) BY MOUTH DAILY   spironolactone (ALDACTONE) 25 MG tablet TAKE ONE TABLET ONCE DAILY   telmisartan (MICARDIS) 80 MG tablet Take 1 tablet (80 mg total) by mouth daily.   No current facility-administered medications on file prior to visit.     Allergies  Allergen Reactions   Diltiazem Hcl Cough   Diltiazem Hcl Other (See Comments) and Cough    ? symptoms   Penicillins     Rash with Amoxicillin with Mono in college    Review Of Systems:  Constitutional:    No  weight loss, night sweats,  Fevers, chills,+ fatigue, or  lassitude.  HEENT:   No headaches,  Difficulty swallowing,  Tooth/dental problems, or  Sore throat,  No sneezing, itching, ear ache, +nasal congestion, +post nasal drip,   CV:  No chest pain,  Orthopnea, PND, swelling in lower extremities, anasarca, dizziness, palpitations, syncope.   GI  No heartburn, indigestion, abdominal pain, nausea, vomiting, diarrhea, change in bowel habits, loss of appetite, bloody stools.   Resp: No shortness of breath with exertion or at rest.  + excess mucus, + productive cough,  No non-productive cough,  No coughing up of blood.  No change in color of mucus.  + wheezing.  No chest wall deformity  Skin: no rash or lesions.  GU: no dysuria, change in color of urine, no urgency or frequency.  No flank pain, no hematuria   MS:  No joint pain or swelling.  No decreased range of motion.  No back pain.  Psych:  No change in mood or affect. No depression or anxiety.  No memory loss.   Vital Signs BP 98/64   Pulse 67   Temp 98.1 F (36.7 C) (Oral)   Ht 5' 8.5" (1.74 m)   Wt 203 lb 12.8 oz (92.4 kg)   SpO2 96% Comment: room air  BMI 30.54 kg/m    Physical Exam:  General- No distress,  A&Ox3, pleasant ENT: No sinus tenderness, TM clear, edematous nasal mucosa, no oral exudate,+ post nasal drip, no LAN Cardiac: S1, S2, regular rate and rhythm, no murmur Chest: No wheeze/ rales/ dullness; no accessory muscle use, no nasal flaring, no sternal retractions, Left lower lobe rhonchi Abd.: Soft Non-tender, ND, BS +, Body mass index is 30.54 kg/m.  Ext: No clubbing cyanosis, edema Neuro:  normal strength, MAE x 4, A&O x 3 Skin: No rashes, warm and dry, No lesions  Psych: normal mood and behavior   Assessment/Plan Slow to resolve post viral syndome Flu diagnosis 04/25/2023, with infiltrates on CRX Suspected viral pneumonia Treated with Doxycycline and pred taper 2/1-05/02/2023 Plan I  am sorry you are having a hard time getting over this flu. I will repeat your prednisone taper. Prednisone taper; 10 mg tablets: 4 tabs x 2 days, 3 tabs x 2 days, 2 tabs x 2 days 1 tab x 2 days then stop.  Call if you develop discolored secretions, fever, or worsening shortness of breath.  Use OTC anti-histamines for post nasal drip.  Continue Albuterol as needed for shortness of breath or wheezing. Follow up 06/09/2023 as is scheduled with CXR prior by 3/15 so results will be read. Call if you need [place] sooner. No school volunteering x 2 weeks, or after you have fully recovered.  Please contact office for sooner follow up if symptoms do not improve or worsen or seek emergency care     I spent 25 minutes dedicated to the care of this patient on the date of this encounter to include pre-visit review of records, face-to-face time with the patient discussing conditions above, post visit ordering of testing, clinical documentation with the electronic health record, making appropriate referrals as documented, and communicating necessary information to the patient's healthcare team.    Bevelyn Ngo, NP 05/05/2023  2:46 PM

## 2023-05-05 NOTE — Patient Instructions (Addendum)
It is good to see you today. I am sorry you are having a hard time getting over this flu. I will repeat your prednisone taper. Prednisone taper; 10 mg tablets: 4 tabs x 2 days, 3 tabs x 2 days, 2 tabs x 2 days 1 tab x 2 days then stop.  Call if you develop discolored secretion, fever, or worsening shortness of breath.  Use OTC anti-histamines for post nasal drip.  Continue Albuterol as needed for shortness of breath or wheezing. Follow up 06/09/2023 as is scheduled with CXR prior by 3/15 so results will be read. Call if you need [place] sooner. No school volunteering x 2 weeks, or after you have fully recovered.  Please contact office for sooner follow up if symptoms do not improve or worsen or seek emergency care

## 2023-05-18 ENCOUNTER — Ambulatory Visit (INDEPENDENT_AMBULATORY_CARE_PROVIDER_SITE_OTHER): Payer: Medicare Other

## 2023-05-18 DIAGNOSIS — E538 Deficiency of other specified B group vitamins: Secondary | ICD-10-CM | POA: Diagnosis not present

## 2023-05-18 MED ORDER — CYANOCOBALAMIN 1000 MCG/ML IJ SOLN
1000.0000 ug | Freq: Once | INTRAMUSCULAR | Status: AC
  Administered 2023-05-18: 1000 ug via INTRAMUSCULAR

## 2023-05-18 NOTE — Progress Notes (Signed)
 After obtaining consent, and per orders of Dr. Lawerance Bach, injection of B12 given by Ferdie Ping. Patient instructed to report any adverse reaction to me immediately.

## 2023-05-21 ENCOUNTER — Other Ambulatory Visit: Payer: Self-pay | Admitting: Hematology and Oncology

## 2023-05-21 ENCOUNTER — Inpatient Hospital Stay: Payer: Medicare Other | Admitting: Hematology and Oncology

## 2023-05-21 ENCOUNTER — Inpatient Hospital Stay: Payer: Medicare Other | Attending: Hematology and Oncology

## 2023-05-21 VITALS — BP 114/71 | HR 80 | Temp 97.9°F | Resp 16 | Wt 210.9 lb

## 2023-05-21 DIAGNOSIS — Z79899 Other long term (current) drug therapy: Secondary | ICD-10-CM | POA: Insufficient documentation

## 2023-05-21 DIAGNOSIS — D509 Iron deficiency anemia, unspecified: Secondary | ICD-10-CM | POA: Insufficient documentation

## 2023-05-21 DIAGNOSIS — D649 Anemia, unspecified: Secondary | ICD-10-CM

## 2023-05-21 DIAGNOSIS — Z862 Personal history of diseases of the blood and blood-forming organs and certain disorders involving the immune mechanism: Secondary | ICD-10-CM | POA: Insufficient documentation

## 2023-05-21 DIAGNOSIS — D508 Other iron deficiency anemias: Secondary | ICD-10-CM

## 2023-05-21 LAB — CMP (CANCER CENTER ONLY)
ALT: 22 U/L (ref 0–44)
AST: 16 U/L (ref 15–41)
Albumin: 4.3 g/dL (ref 3.5–5.0)
Alkaline Phosphatase: 62 U/L (ref 38–126)
Anion gap: 8 (ref 5–15)
BUN: 18 mg/dL (ref 8–23)
CO2: 26 mmol/L (ref 22–32)
Calcium: 9.6 mg/dL (ref 8.9–10.3)
Chloride: 106 mmol/L (ref 98–111)
Creatinine: 1.3 mg/dL — ABNORMAL HIGH (ref 0.61–1.24)
GFR, Estimated: 58 mL/min — ABNORMAL LOW (ref >60)
Glucose, Bld: 185 mg/dL — ABNORMAL HIGH (ref 70–99)
Potassium: 4.6 mmol/L (ref 3.5–5.1)
Sodium: 140 mmol/L (ref 135–145)
Total Bilirubin: 0.4 mg/dL (ref 0.0–1.2)
Total Protein: 6.5 g/dL (ref 6.5–8.1)

## 2023-05-21 LAB — CBC WITH DIFFERENTIAL (CANCER CENTER ONLY)
Abs Immature Granulocytes: 0.02 10*3/uL (ref 0.00–0.07)
Basophils Absolute: 0 10*3/uL (ref 0.0–0.1)
Basophils Relative: 0 %
Eosinophils Absolute: 0.1 10*3/uL (ref 0.0–0.5)
Eosinophils Relative: 2 %
HCT: 33.9 % — ABNORMAL LOW (ref 39.0–52.0)
Hemoglobin: 11.4 g/dL — ABNORMAL LOW (ref 13.0–17.0)
Immature Granulocytes: 0 %
Lymphocytes Relative: 29 %
Lymphs Abs: 2 10*3/uL (ref 0.7–4.0)
MCH: 30 pg (ref 26.0–34.0)
MCHC: 33.6 g/dL (ref 30.0–36.0)
MCV: 89.2 fL (ref 80.0–100.0)
Monocytes Absolute: 0.4 10*3/uL (ref 0.1–1.0)
Monocytes Relative: 6 %
Neutro Abs: 4.3 10*3/uL (ref 1.7–7.7)
Neutrophils Relative %: 63 %
Platelet Count: 138 10*3/uL — ABNORMAL LOW (ref 150–400)
RBC: 3.8 MIL/uL — ABNORMAL LOW (ref 4.22–5.81)
RDW: 14.3 % (ref 11.5–15.5)
WBC Count: 6.9 10*3/uL (ref 4.0–10.5)
nRBC: 0 % (ref 0.0–0.2)

## 2023-05-21 LAB — RETIC PANEL
Immature Retic Fract: 13.6 % (ref 2.3–15.9)
RBC.: 3.7 MIL/uL — ABNORMAL LOW (ref 4.22–5.81)
Retic Count, Absolute: 46.3 10*3/uL (ref 19.0–186.0)
Retic Ct Pct: 1.3 % (ref 0.4–3.1)
Reticulocyte Hemoglobin: 33.6 pg (ref >27.9)

## 2023-05-21 LAB — FERRITIN: Ferritin: 136 ng/mL (ref 24–336)

## 2023-05-21 LAB — IRON AND IRON BINDING CAPACITY (CC-WL,HP ONLY)
Iron: 80 ug/dL (ref 45–182)
Saturation Ratios: 22 % (ref 17.9–39.5)
TIBC: 360 ug/dL (ref 250–450)
UIBC: 280 ug/dL (ref 117–376)

## 2023-05-21 MED ORDER — FERROUS SULFATE 325 (65 FE) MG PO TABS: 325.0000 mg | ORAL_TABLET | Freq: Every day | ORAL | 2 refills | Status: AC

## 2023-05-21 NOTE — Progress Notes (Signed)
 [facility] [phone]   [phone]  PROGRESS NOTE  Patient Care Team: Jonathon Sanes, MD as PCP - General (Internal Medicine) Jonathon Som Madolyn Frieze, MD as PCP - Cardiology (Cardiology) Jonathon Armstrong, OD as Consulting Physician (Optometry) Jonathon Armstrong, Jonathon Armstrong, [facility] (Inactive) (Pharmacist)   CHIEF COMPLAINTS/PURPOSE OF CONSULTATION:  Normocytic Anemia of Unclear Etiology  HISTORY OF PRESENTING ILLNESS:  Jonathon Armstrong 73 y.o. male returns for a follow up for normocytic anemia/ iron deficiency anemia. He is unaccompanied for this visit.   On exam today, Jonathon Armstrong reports he did have an episode about 3 weeks ago where he developed the flu and subsequent pneumonia.  He reports that his symptoms have subsequently resolved.  He reports he did have a cough and was "sick as a dog".  He did have fever, coughing, and bodyaches as well as lack of energy.  He did not take Tamiflu.  He reports that he was on antibiotics, antihistamines, and cough syrup.  He reports that his energy levels have otherwise been good except with the exception of the infection.  He is not having any lightheadedness, dizziness, shortness of breath.  His appetite is strong though he did lose a lot of weight during the flu episode.  He reports that otherwise he remains physically active and has no questions concerns or complaints today.  He denies fevers, chills, sweats, shortness of breath, chest pain or cough. Rest of the 10 point ROS is below.   MEDICAL HISTORY:  Past Medical History:  Diagnosis Date   Allergy    seasonal   Asthma 2017-06-01   Asystole (HCC)    "vagal response"post nasal polypectomy   Complication of anesthesia 06/01/04   Coded x2 after nasal polyp removal (Believes vagal nerve response)   Diabetes mellitus without complication (HCC)    pre but take metformin   Diverticulosis of colon    FH: colonic polyps    hx of   Hyperlipidemia    Hypertension    Mitral regurgitation     mild & aortic Valve calcification on 2d echo;sbe prophylaxis   Parotitis, acute 02-Jun-2011   Seasonal rhinitis    Sleep apnea    CPAP    SURGICAL HISTORY: Past Surgical History:  Procedure Laterality Date   APPENDECTOMY     asytole post nasal polypectomy  03/24/2006   BIOPSY  03/09/2023   Procedure: BIOPSY;  Surgeon: Jonathon Form, MD;  Location: [facility];  Service: Gastroenterology;;   colonoscopy with polypectomy  03/24/2008   Dr Jonathon Armstrong   ESOPHAGOGASTRODUODENOSCOPY (EGD) WITH PROPOFOL N/A 11/10/2022   Procedure: ESOPHAGOGASTRODUODENOSCOPY (EGD) WITH PROPOFOL;  Surgeon: Jonathon Form, MD;  Location: [facility];  Service: Gastroenterology;  Laterality: N/A;   ESOPHAGOGASTRODUODENOSCOPY (EGD) WITH PROPOFOL N/A 03/09/2023   Procedure: ESOPHAGOGASTRODUODENOSCOPY (EGD) WITH PROPOFOL;  Surgeon: Jonathon Form, MD;  Location: [facility];  Service: Gastroenterology;  Laterality: N/A;   HEMOSTASIS CLIP PLACEMENT  11/10/2022   Procedure: HEMOSTASIS CLIP PLACEMENT;  Surgeon: Jonathon Form, MD;  Location: [facility];  Service: Gastroenterology;;   heriorraphy     x2   HERNIA REPAIR     INTERCOSTAL NERVE BLOCK Left 08/16/2019   Procedure: Intercostal Nerve Block;  Surgeon: Jonathon Skains, MD;  Location: [facility] OR;  Service: Thoracic;  Laterality: Left;   LUNG LOBECTOMY Left    LLL   MIDDLE EAR SURGERY     TM replacement   NASAL SINUS SURGERY  1994, [id number]   X3  NODE DISSECTION  08/16/2019   Procedure: Node Dissection;  Surgeon: Jonathon Skains, MD;  Location: [facility] OR;  Service: Thoracic;;   POLYPECTOMY  11/10/2022   Procedure: POLYPECTOMY;  Surgeon: Jonathon Form, MD;  Location: [facility];  Service: Gastroenterology;;   TONSILLECTOMY AND ADENOIDECTOMY     VIDEO BRONCHOSCOPY N/A 08/16/2019   Procedure: VIDEO BRONCHOSCOPY;  Surgeon: Jonathon Skains, MD;  Location: [facility] OR;  Service: Thoracic;  Laterality: N/A;    SOCIAL HISTORY: Social  History   Socioeconomic History   Marital status: Married    Spouse name: Not on file   Number of children: 2   Years of education: 18   Highest education Armstrong: Master's degree (e.g., MA, MS, MEng, MEd, MSW, MBA)  Occupational History   Occupation: Retired    Associate Professor: RETIRED  Tobacco Use   Smoking status: Never   Smokeless tobacco: Never  Vaping Use   Vaping status: Never Used  Substance and Sexual Activity   Alcohol use: Yes    Alcohol/week: 2.0 standard drinks of alcohol    Types: 1 Glasses of wine, 1 Shots of liquor per week    Comment: socially < 2/ week   Drug use: No   Sexual activity: Yes  Other Topics Concern   Not on file  Social History Narrative   Fun: Water, swimming, [employer]; Volunteer work    Denies abuse and feels safe at home.   Regular exercise      Social Drivers of Health   Financial Resource Strain: Low Risk  (12/15/2022)   Overall Financial Resource Strain (CARDIA)    Difficulty of Paying Living Expenses: Not hard at all  Food Insecurity: No Food Insecurity (12/15/2022)   Hunger Vital Sign    Worried About Running Out of Food in the Last Year: Never true    Ran Out of Food in the Last Year: Never true  Transportation Needs: No Transportation Needs (12/15/2022)   PRAPARE - Administrator, Civil Service (Medical): No    Lack of Transportation (Non-Medical): No  Physical Activity: Sufficiently Active (12/15/2022)   Exercise Vital Sign    Days of Exercise per Week: 3 days    Minutes of Exercise per Session: 60 min  Stress: No Stress Concern Present (12/15/2022)   [employer] of Occupational Health - Occupational Stress Questionnaire    Feeling of Stress : Not at all  Social Connections: Socially Integrated (12/15/2022)   Social Connection and Isolation Panel [NHANES]    Frequency of Communication with Friends and Family: More than three times a week    Frequency of Social Gatherings with Friends and Family: More than three times  a week    Attends Religious Services: More than 4 times per year    Active Member of [employer] or Organizations: Yes    Attends Engineer, structural: More than 4 times per year    Marital Status: Married  Catering manager Violence: Not At Risk (05/29/2021)   Humiliation, Afraid, Rape, and Kick questionnaire    Fear of Current or Ex-Partner: No    Emotionally Abused: No    Physically Abused: No    Sexually Abused: No    FAMILY HISTORY: Family History  Problem Relation Age of Onset   Cancer Mother        liver &multiple myeloma   Heart disease Mother        CABG, Angina   Hyperlipidemia Mother    Hypertension Father  Heart disease Father        MI in 30"s   Dementia Father    COPD Sister    Other Sister        perforated bowel   Other Sister        cns aneurysm   Cancer Maternal Grandfather        bone   Diabetes Neg Hx    Stroke Neg Hx    Colon cancer Neg Hx     ALLERGIES:  is allergic to diltiazem hcl, diltiazem hcl, and penicillins.  MEDICATIONS:  Current Outpatient Medications  Medication Sig Dispense Refill   albuterol (VENTOLIN HFA) 108 (90 Base) MCG/ACT inhaler Inhale 2 puffs into the lungs every 6 (six) hours as needed for wheezing or shortness of breath. 8 g 2   aspirin EC 81 MG tablet Take 1 tablet (81 mg total) by mouth daily. Swallow whole. 90 tablet 3   Cholecalciferol (VITAMIN D-3 PO) Take by mouth daily.     Cyanocobalamin (VITAMIN B-12 IJ) Inject 1,000 mcg as directed every 30 (thirty) days.     doxycycline (VIBRAMYCIN) 100 MG capsule Take 100 mg by mouth 2 (two) times daily.     empagliflozin (JARDIANCE) 25 MG TABS tablet Take 1 tablet (25 mg total) by mouth daily. 90 tablet 1   ferrous sulfate (FEROSUL) 325 (65 FE) MG tablet Take 1 tablet (325 mg total) by mouth daily with breakfast. 90 tablet 2   HYDROcodone bit-homatropine (HYCODAN) 5-1.5 MG/5ML syrup Take 5 mLs by mouth every 6 (six) hours as needed for cough. 240 mL 0   labetalol (NORMODYNE)  200 MG tablet Take 1 tablet (200 mg total) by mouth 2 (two) times daily. Must keep upcoming appointment for additional refills 180 tablet 3   metFORMIN (GLUCOPHAGE) 500 MG tablet TAKE TWO TABLETS BY MOUTH TWICE DAILY WITH FOOD 360 tablet 1   Omega-3 Fatty Acids (FISH OIL PO) Take by mouth.     pantoprazole (PROTONIX) 40 MG tablet Take 1 tablet (40 mg total) by mouth 2 (two) times daily before a meal. 60 tablet 5   predniSONE (DELTASONE) 10 MG tablet Prednisone taper; 10 mg tablets: 4 tabs x 2 days, 3 tabs x 2 days, 2 tabs x 2 days 1 tab x 2 days then stop. 20 tablet 0   rosuvastatin (CRESTOR) 40 MG tablet Take 1 tablet (40 mg total) by mouth daily. 90 tablet 3   RYBELSUS 14 MG TABS TAKE 1 TABLET(14 MG) BY MOUTH DAILY 30 tablet 2   spironolactone (ALDACTONE) 25 MG tablet TAKE ONE TABLET ONCE DAILY 90 tablet 1   telmisartan (MICARDIS) 80 MG tablet Take 1 tablet (80 mg total) by mouth daily. 90 tablet 3   No current facility-administered medications for this visit.    REVIEW OF SYSTEMS:   Constitutional: ( - ) fevers, ( - )  chills , ( - ) night sweats Eyes: ( - ) blurriness of vision, ( - ) double vision, ( - ) watery eyes Ears, nose, mouth, throat, and face: ( - ) mucositis, ( - ) sore throat Respiratory: ( - ) cough, ( - ) dyspnea, ( - ) wheezes Cardiovascular: ( - ) palpitation, ( - ) chest discomfort, ( - ) lower extremity swelling Gastrointestinal:  ( - ) nausea, ( - ) heartburn, ( - ) change in bowel habits Skin: ( - ) abnormal skin rashes Lymphatics: ( - ) new lymphadenopathy, ( - ) easy bruising Neurological: ( - ) numbness, ( - )  tingling, ( - ) new weaknesses Behavioral/Psych: ( - ) mood change, ( - ) new changes  All other systems were reviewed with the patient and are negative.  PHYSICAL EXAMINATION: ECOG PERFORMANCE STATUS: 0 - Asymptomatic  Vitals:   05/21/23 1027  BP: 114/71  Pulse: 80  Resp: 16  Temp: 97.9 F (36.6 C)  SpO2: 96%    Filed Weights   05/21/23 1027   Weight: 210 lb 14.4 oz (95.7 kg)     GENERAL: well appearing male in NAD  SKIN: skin color, texture, turgor are normal, no rashes or significant lesions EYES: conjunctiva are pink and non-injected, sclera clear LUNGS: clear to auscultation and percussion with normal breathing effort HEART: regular rate & rhythm and no murmurs and no lower extremity edema Musculoskeletal: no cyanosis of digits and no clubbing  PSYCH: alert & oriented x 3, fluent speech NEURO: no focal motor/sensory deficits  LABORATORY DATA:  I have reviewed the data as listed    Latest Ref Rng & Units 05/21/2023    9:51 AM 03/03/2023    1:30 PM 12/22/2022   11:28 AM  CBC  WBC 4.0 - 10.5 K/uL 6.9  5.9  5.2   Hemoglobin 13.0 - 17.0 g/dL 29.5  62.1  30.8   Hematocrit 39.0 - 52.0 % 33.9  33.4  35.3   Platelets 150 - 400 K/uL 138  154  170.0        Latest Ref Rng & Units 05/21/2023    9:51 AM 03/03/2023    1:30 PM 12/22/2022   11:28 AM  CMP  Glucose 70 - 99 mg/dL 657  846  962   BUN 8 - 23 mg/dL 18  21  23    Creatinine 0.61 - 1.24 mg/dL 9.52  8.41  3.24   Sodium 135 - 145 mmol/L 140  140  141   Potassium 3.5 - 5.1 mmol/L 4.6  4.1  4.4   Chloride 98 - 111 mmol/L 106  107  107   CO2 22 - 32 mmol/L 26  25  23    Calcium 8.9 - 10.3 mg/dL 9.6  40.1  9.9   Total Protein 6.5 - 8.1 g/dL 6.5  6.8  7.0   Total Bilirubin 0.0 - 1.2 mg/dL 0.4  0.5  0.4   Alkaline Phos 38 - 126 U/L 62  66  61   AST 15 - 41 U/L 16  21  17    ALT 0 - 44 U/L 22  24  19     ASSESSMENT & PLAN Jonathon Armstrong is a 73 y.o. male returns for a follow up for iron deficiency anemia.   #Normocytic anemia: #Iron deficiency anemia: --Etiology unknown. Underwent EGD and colonoscopy on 06/18/2022. EGD showed single mucosal nodule in GE junction that was negative for dysplasia or malignancy. Otherwise normal EGD.  Colonoscopy showed several polyps that were negative for dysplasia or malignancy, moderate diverticulosis without evidence of bleeding,  non-bleeding external and internal hemorrhoids.  --Currently on PO iron daily without any difficulty --Labs from today show persistent anemia with Hgb 11.4, white blood cell 6.9, MCV 89.2, platelets 138 --if iron levels are persistently low, recommend IV iron to help bolster his iron levels.  --Given the persistence of his normocytic anemia without clear iron deficiency I would recommend considering a bone marrow biopsy.  We have not found any clear reason why his hemoglobin is below normal limits. --Patient opts for observation at this time and declines bone marrow biopsy unless absolutely  necessary.  If hemoglobin is consistently below 11 would recommend consideration of this procedure. --RTC in 6 months with 51-month interval labs.  #Thrombocytopenia-resolved --Etiology unknown but possible immune mediated process since immature platelet fraction was elevated on 03/25/2022. Remaining workup was negative for vitamin B12 and folate deficiencies or hepatitis B/C. --Platelet counts are back to normal, appear to fluctuate from low normal to slightly low. --No further workup required at this time.   No orders of the defined types were placed in this encounter.   All questions were answered. The patient knows to call the clinic with any problems, questions or concerns.  I have spent a total of 30 minutes minutes of face-to-face and non-face-to-face time, preparing to see the patient, performing a medically appropriate examination, counseling and educating the patient, ordering meds,  documenting clinical information in the electronic health record, and care coordination.   Ulysees Barns, MD Department of Hematology/Oncology [facility] at [facility] Phone: [phone] Pager: [phone] Email: [email or web address]

## 2023-05-23 ENCOUNTER — Encounter: Payer: Self-pay | Admitting: Physician Assistant

## 2023-05-29 ENCOUNTER — Ambulatory Visit

## 2023-05-29 DIAGNOSIS — J189 Pneumonia, unspecified organism: Secondary | ICD-10-CM

## 2023-05-29 DIAGNOSIS — R918 Other nonspecific abnormal finding of lung field: Secondary | ICD-10-CM | POA: Diagnosis not present

## 2023-06-01 ENCOUNTER — Other Ambulatory Visit: Payer: Self-pay | Admitting: Internal Medicine

## 2023-06-05 ENCOUNTER — Ambulatory Visit: Payer: Medicare Other | Admitting: Acute Care

## 2023-06-09 ENCOUNTER — Ambulatory Visit: Payer: Medicare Other | Admitting: Acute Care

## 2023-06-09 ENCOUNTER — Encounter: Payer: Self-pay | Admitting: Acute Care

## 2023-06-09 VITALS — BP 106/68 | HR 66 | Ht 68.0 in | Wt 208.6 lb

## 2023-06-09 DIAGNOSIS — Z85118 Personal history of other malignant neoplasm of bronchus and lung: Secondary | ICD-10-CM

## 2023-06-09 DIAGNOSIS — J189 Pneumonia, unspecified organism: Secondary | ICD-10-CM | POA: Diagnosis not present

## 2023-06-09 NOTE — Progress Notes (Signed)
 History of Present Illness Jonathon Armstrong is a 73 y.o. male 73 y.o. male never smoker with past medical history of diabetes, hypertension, hyperlipidemia.  Had a CT coronary of the heart which revealed a lower lobe pulmonary nodule. This nodule had low grade PET avidity and patient had bronchoscopy, robotic assisted left wedge resection and  left lower lobectomy 07/2019. Biopsy was positive for stage I adenocarcinoma. He is currently followed by Dr. Cliffton Asters with surveillance CT's . He was seen by Dr. Tonia Brooms during his lung nodule work up. Last seen in our office 10/04/2021 for acute cough.  He was seen  04/25/2023 for an acute visit for flu. He finished a previously prescribed prednisone and Doxycycline treatment , and was seen again 05/05/2023 for continued wheezing, and his cough has increased, he has post nasal drip. We treated with a prednisone taper, and he had a follow up CXR to ensure the pneumonia was resolved. He is here today for follow up.   06/09/2023 I patient presents for follow-up after slow to resolve pneumonia.  Patient was seen February fourth 2025 and then again May 05, 2023, as he was not improving after antibiotics.  February 11 patient was prescribed a prednisone taper which significantly improved his respiratory status and addressed his dyspnea.  He completed the treatment.  He presents today for follow-up chest x-ray and also to ensure that he was fully recovered from his postviral pneumonia.  Patient states he is back to baseline.  He has his energy back.  We reviewed the chest x-ray that was done May 29, 2023.  This showed unchanged right basilar linear opacities that are likely atelectasis or scarring.    We discussed that should he develop any further symptoms I would like him to call us so we can address them early on.  Patient remains under CT chest surveillance after robotic assisted left wedge resection and  left lower lobectomy 07/2019.  He has follow-up with Dr.  Cliffton Asters in July 2025.  Patient states that Dr. Cliffton Asters has been ordering his surveillance scans therefore we will follow-up with him regarding CT review.  Patient had no further questions or concerns at completion of the office visit.  He knows to give [place] a call if he needs [place].  Test Results: CXR 05/29/2023 Normal lung volumes. Unchanged right basilar linear opacities. No pleural effusion or pneumothorax. The heart size and mediastinal contours are within normal limits. No acute osseous abnormality.   IMPRESSION: Unchanged right basilar linear opacities, likely atelectasis/scarring. No focal consolidations.    Latest Ref Rng & Units 05/21/2023    9:51 AM 03/03/2023    1:30 PM 12/22/2022   11:28 AM  CBC  WBC 4.0 - 10.5 K/uL 6.9  5.9  5.2   Hemoglobin 13.0 - 17.0 g/dL 16.1  09.6  04.5   Hematocrit 39.0 - 52.0 % 33.9  33.4  35.3   Platelets 150 - 400 K/uL 138  154  170.0        Latest Ref Rng & Units 05/21/2023    9:51 AM 03/03/2023    1:30 PM 12/22/2022   11:28 AM  BMP  Glucose 70 - 99 mg/dL 409  811  914   BUN 8 - 23 mg/dL 18  21  23    Creatinine 0.61 - 1.24 mg/dL 7.82  9.56  2.13   Sodium 135 - 145 mmol/L 140  140  141   Potassium 3.5 - 5.1 mmol/L 4.6  4.1  4.4  Chloride 98 - 111 mmol/L 106  107  107   CO2 22 - 32 mmol/L 26  25  23    Calcium 8.9 - 10.3 mg/dL 9.6  66.0  9.9     BNP No results found for: "BNP"  ProBNP No results found for: "PROBNP"  PFT    Component Value Date/Time   FEV1PRE 2.47 08/12/2019 1447   FEV1POST 2.67 08/12/2019 1447   FVCPRE 3.13 08/12/2019 1447   FVCPOST 3.31 08/12/2019 1447   TLC 5.67 08/12/2019 1447   DLCOUNC 21.81 08/12/2019 1447   PREFEV1FVCRT 79 08/12/2019 1447   PSTFEV1FVCRT 81 08/12/2019 1447    DG Chest 2 View Result Date: 06/08/2023 CLINICAL DATA:  Follow-up pneumonia EXAM: CHEST - 2 VIEW COMPARISON:  CT chest dated 10/10/2022, chest radiograph dated 04/13/2020 FINDINGS: Normal lung volumes. Unchanged right basilar linear  opacities. No pleural effusion or pneumothorax. The heart size and mediastinal contours are within normal limits. No acute osseous abnormality. IMPRESSION: Unchanged right basilar linear opacities, likely atelectasis/scarring. No focal consolidations. Electronically Signed   By: Agustin Cree M.D.   On: 06/08/2023 16:05     Past medical hx Past Medical History:  Diagnosis Date   Allergy    seasonal   Asthma June 23, 2017   Asystole (HCC)    "vagal response"post nasal polypectomy   Complication of anesthesia 06-23-04   Coded x2 after nasal polyp removal (Believes vagal nerve response)   Diabetes mellitus without complication (HCC)    pre but take metformin   Diverticulosis of colon    FH: colonic polyps    hx of   Hyperlipidemia    Hypertension    Mitral regurgitation    mild & aortic Valve calcification on 2d echo;sbe prophylaxis   Parotitis, acute 06/24/11   Seasonal rhinitis    Sleep apnea    CPAP     Social History   Tobacco Use   Smoking status: Never    Passive exposure: Past   Smokeless tobacco: Never  Vaping Use   Vaping status: Never Used  Substance Use Topics   Alcohol use: Yes    Alcohol/week: 2.0 standard drinks of alcohol    Types: 1 Glasses of wine, 1 Shots of liquor per week    Comment: socially < 2/ week   Drug use: No    Jonathon Armstrong reports that he has never smoked. He has been exposed to tobacco smoke. He has never used smokeless tobacco. He reports current alcohol use of about 2.0 standard drinks of alcohol per week. He reports that he does not use drugs.  Tobacco Cessation: Never smoker    Past surgical hx, Family hx, Social hx all reviewed.  Current Outpatient Medications on File Prior to Visit  Medication Sig   albuterol (VENTOLIN HFA) 108 (90 Base) MCG/ACT inhaler Inhale 2 puffs into the lungs every 6 (six) hours as needed for wheezing or shortness of breath.   aspirin EC 81 MG tablet Take 1 tablet (81 mg total) by mouth daily. Swallow whole.   Cholecalciferol  (VITAMIN D-3 PO) Take by mouth daily.   Cyanocobalamin (VITAMIN B-12 IJ) Inject 1,000 mcg as directed every 30 (thirty) days.   doxycycline (VIBRAMYCIN) 100 MG capsule Take 100 mg by mouth 2 (two) times daily.   empagliflozin (JARDIANCE) 25 MG TABS tablet Take 1 tablet (25 mg total) by mouth daily.   ferrous sulfate (FEROSUL) 325 (65 FE) MG tablet Take 1 tablet (325 mg total) by mouth daily with breakfast.   labetalol (NORMODYNE)  200 MG tablet Take 1 tablet (200 mg total) by mouth 2 (two) times daily. Must keep upcoming appointment for additional refills   metFORMIN (GLUCOPHAGE) 500 MG tablet TAKE TWO TABLETS BY MOUTH TWICE DAILY WITH FOOD   Omega-3 Fatty Acids (FISH OIL PO) Take by mouth.   RYBELSUS 14 MG TABS TAKE 1 TABLET(14 MG) BY MOUTH DAILY   spironolactone (ALDACTONE) 25 MG tablet TAKE ONE TABLET ONCE DAILY   telmisartan (MICARDIS) 80 MG tablet Take 1 tablet (80 mg total) by mouth daily.   rosuvastatin (CRESTOR) 40 MG tablet Take 1 tablet (40 mg total) by mouth daily.   No current facility-administered medications on file prior to visit.     Allergies  Allergen Reactions   Diltiazem Hcl Cough   Diltiazem Hcl Other (See Comments) and Cough    ? symptoms   Penicillins     Rash with Amoxicillin with Mono in college    Review Of Systems:  Constitutional:   No  weight loss, night sweats,  Fevers, chills, fatigue, or  lassitude.  HEENT:   No headaches,  Difficulty swallowing,  Tooth/dental problems, or  Sore throat,                No sneezing, itching, ear ache, nasal congestion, post nasal drip,   CV:  No chest pain,  Orthopnea, PND, swelling in lower extremities, anasarca, dizziness, palpitations, syncope.   GI  No heartburn, indigestion, abdominal pain, nausea, vomiting, diarrhea, change in bowel habits, loss of appetite, bloody stools.   Resp: No shortness of breath with exertion or at rest.  No excess mucus, no productive cough,  No non-productive cough,  No coughing up of  blood.  No change in color of mucus.  No wheezing.  No chest wall deformity  Skin: no rash or lesions.  GU: no dysuria, change in color of urine, no urgency or frequency.  No flank pain, no hematuria   MS:  No joint pain or swelling.  No decreased range of motion.  No back pain.  Psych:  No change in mood or affect. No depression or anxiety.  No memory loss.   Vital Signs BP 106/68 (BP Location: Left Arm, Patient Position: Sitting, Cuff Size: Large)   Pulse 66   Ht 5\' 8"  (1.727 m)   Wt 208 lb 9.6 oz (94.6 kg)   SpO2 99%   BMI 31.72 kg/m    Physical Exam:  General- No distress,  A&Ox3, pleasant ENT: No sinus tenderness, TM clear, pale nasal mucosa, no oral exudate,no post nasal drip, no LAN Cardiac: S1, S2, regular rate and rhythm, no murmur Chest: No wheeze/ rales/ dullness; no accessory muscle use, no nasal flaring, no sternal retractions Abd.: Soft Non-tender, nondistended, bowel sounds positive,Body mass index is 31.72 kg/m.  Ext: No clubbing cyanosis, edema Neuro:  normal strength, moving all extremities x 4, alert and oriented x 3, appropriate Skin: No rashes, warm and dry, no obvious lesions Psych: normal mood and behavior   Assessment/Plan History of lung cancer  SP resection  Surveillance for lung nodules   On annual surveillance for lung nodules. Last CT in July showed no new nodules. Continued vigilance necessary.   - Continue annual CT scan surveillance. Next scan due in July 2025.   - Coordinate with Dr. Cliffton Asters for CT scan order.    Right basilar opacities Slow to resolve pneumonia, post viral   Chest x-ray shows unchanged right basilar opacities, likely atelectasis or scarring. Asymptomatic and clinically improved  after antibiotics and pred taper.   - Monitor for new symptoms and return for evaluation if new symptoms.  I spent 30 minutes dedicated to the care of this patient on the date of this encounter to include pre-visit review of records,  face-to-face time with the patient discussing conditions above, post visit ordering of testing, clinical documentation with the electronic health record, making appropriate referrals as documented, and communicating necessary information to the patient's healthcare team.    Bevelyn Ngo, NP 06/09/2023  9:07 AM

## 2023-06-09 NOTE — Patient Instructions (Addendum)
 It is good to see you today. I am so glad you are better.  CXR shows some scarring, which we will continue to watch with your surveillance CT's.  Next scan is due 09/2023. Dr. Cliffton Asters has been ordering these.  Please call for any additional breathing issues.  Let us know if you need [place] sooner. Please contact office for sooner follow up if symptoms do not improve or worsen or seek emergency care.

## 2023-06-23 ENCOUNTER — Ambulatory Visit (INDEPENDENT_AMBULATORY_CARE_PROVIDER_SITE_OTHER): Payer: Medicare Other

## 2023-06-23 VITALS — Ht 68.0 in | Wt 208.0 lb

## 2023-06-23 DIAGNOSIS — Z0000 Encounter for general adult medical examination without abnormal findings: Secondary | ICD-10-CM

## 2023-06-23 NOTE — Patient Instructions (Signed)
 Jonathon Armstrong , Thank you for taking time to come for your Medicare Wellness Visit. I appreciate your ongoing commitment to your health goals. Please review the following plan we discussed and let me know if I can assist you in the future.   Referrals/Orders/Follow-Ups/Clinician Recommendations: It was nice to talk with you today.  You are due for a tetanus vaccine and a eye examination.  You also are due for a foot exam and an A1C check, which will be done during your next visit with Dr. Lawerance Bach.    This is a list of the screening recommended for you and due dates:  Health Maintenance  Topic Date Due   DTaP/Tdap/Td vaccine (2 - Td or Tdap) 03/24/2014   Complete foot exam   03/03/2019   Eye exam for diabetics  08/15/2022   Medicare Annual Wellness Visit  06/19/2023   Hemoglobin A1C  06/21/2023   COVID-19 Vaccine (6 - Pfizer risk 2024-25 season) 07/24/2023   Flu Shot  10/23/2023   Yearly kidney health urinalysis for diabetes  12/22/2023   Yearly kidney function blood test for diabetes  05/20/2024   Pneumonia Vaccine  Completed   Hepatitis C Screening  Completed   HPV Vaccine  Aged Out   Colon Cancer Screening  Discontinued   Zoster (Shingles) Vaccine  Discontinued    Advanced directives: (Copy Requested) Please bring a copy of your health care power of attorney and living will to the office to be added to your chart at your convenience. You can mail to [facility] [address] [place], [place] [address] or email to [email or web address]  Next Medicare Annual Wellness Visit scheduled for next year: Yes

## 2023-06-23 NOTE — Progress Notes (Signed)
 Subjective:   Jonathon Armstrong is a 73 y.o. who presents for a Medicare Wellness preventive visit.  Visit Complete: Virtual I connected with  Jonathon Armstrong on 06/23/23 by a video and audio enabled telemedicine application and verified that I am speaking with the correct person using two identifiers.  Patient Location: Home  Provider Location: Home Office  I discussed the limitations of evaluation and management by telemedicine. The patient expressed understanding and agreed to proceed.  Vital Signs: Because this visit was a virtual/telehealth visit, some criteria may be missing or patient reported. Any vitals not documented were not able to be obtained and vitals that have been documented are patient reported.  Persons Participating in Visit: Patient.  AWV Questionnaire: Yes: Patient Medicare AWV questionnaire was completed by the patient on 06/16/2023; I have confirmed that all information answered by patient is correct and no changes since this date.  Cardiac Risk Factors include: advanced age (>7men, >25 women);male gender;hypertension;dyslipidemia     Objective:    Today's Vitals   06/23/23 0934  Weight: 208 lb (94.3 kg)  Height: 5\' 8"  (1.727 m)   Body mass index is 31.63 kg/m.     06/23/2023    9:42 AM 03/09/2023   10:28 AM 11/10/2022    8:01 AM 05/29/2021   11:16 AM 05/28/2020   10:24 AM 08/18/2019    2:45 PM 08/16/2019    8:25 AM  Advanced Directives  Does Patient Have a Medical Advance Directive? Yes Yes Yes Yes Yes Yes Yes  Type of Estate agent of [place];Living will  Healthcare Power of [employer];Living will Living will;Healthcare Power of Attorney Living will;Healthcare Power of [employer] Power of Attorney Healthcare Power of Attorney  Does patient want to make changes to medical advance directive?    No - Patient declined No - Patient declined No - Patient declined   Copy of Healthcare Power of Attorney in Chart? No - copy  requested  Yes - validated most recent copy scanned in chart (See row information) No - copy requested No - copy requested No - copy requested     Current Medications (verified) Outpatient Encounter Medications as of 06/23/2023  Medication Sig   albuterol (VENTOLIN HFA) 108 (90 Base) MCG/ACT inhaler Inhale 2 puffs into the lungs every 6 (six) hours as needed for wheezing or shortness of breath.   aspirin EC 81 MG tablet Take 1 tablet (81 mg total) by mouth daily. Swallow whole.   Cholecalciferol (VITAMIN D-3 PO) Take by mouth daily.   Cyanocobalamin (VITAMIN B-12 IJ) Inject 1,000 mcg as directed every 30 (thirty) days.   doxycycline (VIBRAMYCIN) 100 MG capsule Take 100 mg by mouth 2 (two) times daily.   empagliflozin (JARDIANCE) 25 MG TABS tablet Take 1 tablet (25 mg total) by mouth daily.   ferrous sulfate (FEROSUL) 325 (65 FE) MG tablet Take 1 tablet (325 mg total) by mouth daily with breakfast.   labetalol (NORMODYNE) 200 MG tablet Take 1 tablet (200 mg total) by mouth 2 (two) times daily. Must keep upcoming appointment for additional refills   metFORMIN (GLUCOPHAGE) 500 MG tablet TAKE TWO TABLETS BY MOUTH TWICE DAILY WITH FOOD   Omega-3 Fatty Acids (FISH OIL PO) Take by mouth.   RYBELSUS 14 MG TABS TAKE 1 TABLET(14 MG) BY MOUTH DAILY   spironolactone (ALDACTONE) 25 MG tablet TAKE ONE TABLET ONCE DAILY   telmisartan (MICARDIS) 80 MG tablet Take 1 tablet (80 mg total) by mouth daily.   rosuvastatin (  CRESTOR) 40 MG tablet Take 1 tablet (40 mg total) by mouth daily.   No facility-administered encounter medications on file as of 06/23/2023.    Allergies (verified) Diltiazem hcl, Diltiazem hcl, and Penicillins   History: Past Medical History:  Diagnosis Date   Allergy    seasonal   Asthma 07/24/17   Asystole (HCC)    "vagal response"post nasal polypectomy   Complication of anesthesia Jul 24, 2004   Coded x2 after nasal polyp removal (Believes vagal nerve response)   Diabetes mellitus without  complication (HCC)    pre but take metformin   Diverticulosis of colon    FH: colonic polyps    hx of   Hyperlipidemia    Hypertension    Mitral regurgitation    mild & aortic Valve calcification on 2d echo;sbe prophylaxis   Parotitis, acute Jul 25, 2011   Seasonal rhinitis    Sleep apnea    CPAP   Past Surgical History:  Procedure Laterality Date   APPENDECTOMY     asytole post nasal polypectomy  03/24/2006   BIOPSY  03/09/2023   Procedure: BIOPSY;  Surgeon: Napoleon Form, MD;  Location: [facility];  Service: Gastroenterology;;   colonoscopy with polypectomy  03/24/2008   Dr Juanda Chance   ESOPHAGOGASTRODUODENOSCOPY (EGD) WITH PROPOFOL N/A 11/10/2022   Procedure: ESOPHAGOGASTRODUODENOSCOPY (EGD) WITH PROPOFOL;  Surgeon: Napoleon Form, MD;  Location: [facility];  Service: Gastroenterology;  Laterality: N/A;   ESOPHAGOGASTRODUODENOSCOPY (EGD) WITH PROPOFOL N/A 03/09/2023   Procedure: ESOPHAGOGASTRODUODENOSCOPY (EGD) WITH PROPOFOL;  Surgeon: Napoleon Form, MD;  Location: [facility];  Service: Gastroenterology;  Laterality: N/A;   HEMOSTASIS CLIP PLACEMENT  11/10/2022   Procedure: HEMOSTASIS CLIP PLACEMENT;  Surgeon: Napoleon Form, MD;  Location: [facility];  Service: Gastroenterology;;   heriorraphy     x2   HERNIA REPAIR     INTERCOSTAL NERVE BLOCK Left 08/16/2019   Procedure: Intercostal Nerve Block;  Surgeon: Corliss Skains, MD;  Location: [facility];  Service: Thoracic;  Laterality: Left;   LUNG LOBECTOMY Left    LLL   MIDDLE EAR SURGERY     TM replacement   NASAL SINUS SURGERY  07-24-92, [id number]   X3   NODE DISSECTION  08/16/2019   Procedure: Node Dissection;  Surgeon: Corliss Skains, MD;  Location: [facility];  Service: Thoracic;;   POLYPECTOMY  11/10/2022   Procedure: POLYPECTOMY;  Surgeon: Napoleon Form, MD;  Location: [facility];  Service: Gastroenterology;;   TONSILLECTOMY AND ADENOIDECTOMY     VIDEO BRONCHOSCOPY N/A 08/16/2019   Procedure:  VIDEO BRONCHOSCOPY;  Surgeon: Corliss Skains, MD;  Location: [facility];  Service: Thoracic;  Laterality: N/A;   Family History  Problem Relation Age of Onset   Cancer Mother        liver &multiple myeloma   Heart disease Mother        CABG, Angina   Hyperlipidemia Mother    Hypertension Father    Heart disease Father        MI in 56"s   Dementia Father    COPD Sister    Other Sister        perforated bowel   Other Sister        cns aneurysm   Cancer Maternal Grandfather        bone   Diabetes Neg Hx    Stroke Neg Hx    Colon cancer Neg Hx    Social History   Socioeconomic History   Marital status: Married  Spouse name: Johnny Bridge   Number of children: 2   Years of education: 58   Highest education level: Master's degree (e.g., MA, MS, MEng, MEd, MSW, MBA)  Occupational History   Occupation: Retired    Associate Professor: RETIRED  Tobacco Use   Smoking status: Never    Passive exposure: Past   Smokeless tobacco: Never  Vaping Use   Vaping status: Never Used  Substance and Sexual Activity   Alcohol use: Yes    Alcohol/week: 2.0 standard drinks of alcohol    Types: 1 Glasses of wine, 1 Shots of liquor per week    Comment: socially < 2/ week   Drug use: No   Sexual activity: Yes  Other Topics Concern   Not on file  Social History Narrative   Fun: Water, swimming, [employer]; Volunteer work    Denies abuse and feels safe at home.   Regular exercise      Lives with wife      Social Drivers of Health   Financial Resource Strain: Low Risk  (06/16/2023)   Overall Financial Resource Strain (CARDIA)    Difficulty of Paying Living Expenses: Not hard at all  Food Insecurity: No Food Insecurity (06/16/2023)   Hunger Vital Sign    Worried About Running Out of Food in the Last Year: Never true    Ran Out of Food in the Last Year: Never true  Transportation Needs: No Transportation Needs (06/16/2023)   PRAPARE - Administrator, Civil Service (Medical): No    Lack of  Transportation (Non-Medical): No  Physical Activity: Sufficiently Active (06/16/2023)   Exercise Vital Sign    Days of Exercise per Week: 3 days    Minutes of Exercise per Session: 60 min  Stress: No Stress Concern Present (06/16/2023)   [employer] of Occupational Health - Occupational Stress Questionnaire    Feeling of Stress : Not at all  Social Connections: Socially Integrated (06/16/2023)   Social Connection and Isolation Panel [NHANES]    Frequency of Communication with Friends and Family: More than three times a week    Frequency of Social Gatherings with Friends and Family: More than three times a week    Attends Religious Services: More than 4 times per year    Active Member of [employer] or Organizations: Yes    Attends Engineer, structural: More than 4 times per year    Marital Status: Married    Tobacco Counseling Counseling given: Not Answered    Clinical Intake:  Pre-visit preparation completed: Yes  Pain : No/denies pain     BMI - recorded: 31.63 Nutritional Status: BMI > 30  Obese Nutritional Risks: None Diabetes: Yes CBG done?: No Did pt. bring in CBG monitor from home?: No  Lab Results  Component Value Date   HGBA1C 7.3 (H) 12/22/2022   HGBA1C 7.2 (H) 02/25/2022   HGBA1C 7.1 (H) 11/05/2021     How often do you need to have someone help you when you read instructions, pamphlets, or other written materials from your doctor or pharmacy?: 1 - Never  Interpreter Needed?: No  Information entered by :: Vanisha Whiten, RMA   Activities of Daily Living     06/23/2023    9:38 AM  In your present state of health, do you have any difficulty performing the following activities:  Hearing? 0  Vision? 0  Difficulty concentrating or making decisions? 0  Walking or climbing stairs? 0  Dressing or bathing? 0  Doing  errands, shopping? 0  Preparing Food and eating ? N  Using the Toilet? N  In the past six months, have you accidently leaked urine? N   Do you have problems with loss of bowel control? N  Managing your Medications? N  Managing your Finances? N  Housekeeping or managing your Housekeeping? N    Patient Care Team: Pincus Sanes, MD as PCP - General (Internal Medicine) Jens Som Madolyn Frieze, MD as PCP - Cardiology (Cardiology) [place], Heather, OD as Consulting Physician (Optometry) Szabat, Vinnie Level, [facility] (Inactive) (Pharmacist)  Indicate any recent Medical Services you may have received from other than [facility] providers in the past year (date may be approximate).     Assessment:   This is a routine wellness examination for [place].  Hearing/Vision screen Hearing Screening - Comments:: Denies hearing difficulties   Vision Screening - Comments:: Wears eyeglasses   Goals Addressed               This Visit's Progress     Patient Stated (pt-stated)        A1C to decrease some.       Depression Screen     06/23/2023    9:44 AM 06/23/2023    9:43 AM 06/19/2022   10:25 AM 03/04/2022    8:00 AM 11/11/2021    8:06 AM 09/09/2021   11:40 AM 08/08/2021    2:49 PM  PHQ 2/9 Scores  PHQ - 2 Score 0 3 0 0 0 0 2  PHQ- 9 Score 0  0 0 0 1 8    Fall Risk     06/23/2023    9:42 AM 06/09/2023    8:34 AM 04/28/2023   10:04 AM 06/19/2022   10:25 AM 03/04/2022    8:00 AM  Fall Risk   Falls in the past year? 0 0 0 0 0  Number falls in past yr: 0  0 0 0  Injury with Fall? 0  0 0 0  Risk for fall due to : No Fall Risks   No Fall Risks No Fall Risks  Follow up Falls prevention discussed;Falls evaluation completed   Falls evaluation completed Falls evaluation completed    MEDICARE RISK AT HOME:  Medicare Risk at Home Any stairs in or around the home?: Yes If so, are there any without handrails?: Yes Home free of loose throw rugs in walkways, pet beds, electrical cords, etc?: Yes Adequate lighting in your home to reduce risk of falls?: Yes Life alert?: No Use of a cane, walker or w/c?: No Grab bars in the bathroom?: Yes (in the  shower) Shower chair or bench in shower?: Yes Elevated toilet seat or a handicapped toilet?: Yes  TIMED UP AND GO:  Was the test performed?  No  Cognitive Function: 6CIT completed        06/23/2023   10:06 AM 05/29/2021   11:20 AM  6CIT Screen  What Year? 0 points 0 points  What month? 0 points 0 points  What time? 0 points 0 points  Count back from 20 0 points 0 points  Months in reverse 0 points 0 points  Repeat phrase 0 points 0 points  Total Score 0 points 0 points    Immunizations Immunization History  Administered Date(s) Administered   Fluad Quad(high Dose 65+) 11/12/2018, 01/03/2020   Fluad Trivalent(High Dose 65+) 01/16/2023   Influenza Split 01/10/2011   Influenza Whole 02/18/2010   Influenza, High Dose Seasonal PF 12/25/2016, 01/04/2018, 01/27/2022   Influenza-Unspecified  01/05/2013, 01/23/2015, 02/06/2016   PFIZER(Purple Top)SARS-COV-2 Vaccination 04/18/2019, 05/09/2019, 02/04/2020   Pfizer Covid-19 Vaccine Bivalent Booster 65yrs & up 10/08/2020   Pfizer(Comirnaty)Fall Seasonal Vaccine 12 years and older 01/24/2023   Pneumococcal Conjugate-13 03/28/2015   Pneumococcal Polysaccharide-23 04/30/2016   Respiratory Syncytial Virus Vaccine,Recomb Aduvanted(Arexvy) 02/10/2022   Tdap 03/24/2004   Zoster Recombinant(Shingrix) 09/04/2021    Screening Tests Health Maintenance  Topic Date Due   DTaP/Tdap/Td (2 - Td or Tdap) 03/24/2014   FOOT EXAM  03/03/2019   OPHTHALMOLOGY EXAM  08/15/2022   HEMOGLOBIN A1C  06/21/2023   COVID-19 Vaccine (6 - Pfizer risk 2024-25 season) 07/24/2023   INFLUENZA VACCINE  10/23/2023   Diabetic kidney evaluation - Urine ACR  12/22/2023   Diabetic kidney evaluation - eGFR measurement  05/20/2024   Medicare Annual Wellness (AWV)  06/22/2024   Pneumonia Vaccine 44+ Years old  Completed   Hepatitis C Screening  Completed   HPV VACCINES  Aged Out   Colonoscopy  Discontinued   Zoster Vaccines- Shingrix  Discontinued    Health  Maintenance  Health Maintenance Due  Topic Date Due   DTaP/Tdap/Td (2 - Td or Tdap) 03/24/2014   FOOT EXAM  03/03/2019   OPHTHALMOLOGY EXAM  08/15/2022   HEMOGLOBIN A1C  06/21/2023   Health Maintenance Items Addressed: See Nurse Notes  Additional Screening:  Vision Screening: Recommended annual ophthalmology exams for early detection of glaucoma and other disorders of the eye.  Dental Screening: Recommended annual dental exams for proper oral hygiene  Community Resource Referral / Chronic Care Management: CRR required this visit?  No   CCM required this visit?  No     Plan:     I have personally reviewed and noted the following in the patient's chart:   Medical and social history Use of alcohol, tobacco or illicit drugs  Current medications and supplements including opioid prescriptions. Patient is not currently taking opioid prescriptions. Functional ability and status Nutritional status Physical activity Advanced directives List of other physicians Hospitalizations, surgeries, and ER visits in previous 12 months Vitals Screenings to include cognitive, depression, and falls Referrals and appointments  In addition, I have reviewed and discussed with patient certain preventive protocols, quality metrics, and best practice recommendations. A written personalized care plan for preventive services as well as general preventive health recommendations were provided to patient.     Harlie Buening L Joesiah Lonon, CMA   06/23/2023   After Visit Summary: (MyChart) Due to this being a telephonic visit, the after visit summary with patients personalized plan was offered to patient via MyChart   Notes: Please refer to Routing Comments.

## 2023-06-28 ENCOUNTER — Encounter: Payer: Self-pay | Admitting: Internal Medicine

## 2023-06-28 NOTE — Progress Notes (Unsigned)
      Subjective:    Patient ID: Jonathon Armstrong, male    DOB: Aug 06, 1950, 73 y.o.   MRN: [id number]     HPI Jonathon Armstrong is here for follow up of his chronic medical problems.    Medications and allergies reviewed with patient and updated if appropriate.  Current Outpatient Medications on File Prior to Visit  Medication Sig Dispense Refill   albuterol (VENTOLIN HFA) 108 (90 Base) MCG/ACT inhaler Inhale 2 puffs into the lungs every 6 (six) hours as needed for wheezing or shortness of breath. 8 g 2   aspirin EC 81 MG tablet Take 1 tablet (81 mg total) by mouth daily. Swallow whole. 90 tablet 3   Cholecalciferol (VITAMIN D-3 PO) Take by mouth daily.     Cyanocobalamin (VITAMIN B-12 IJ) Inject 1,000 mcg as directed every 30 (thirty) days.     doxycycline (VIBRAMYCIN) 100 MG capsule Take 100 mg by mouth 2 (two) times daily.     empagliflozin (JARDIANCE) 25 MG TABS tablet Take 1 tablet (25 mg total) by mouth daily. 90 tablet 1   ferrous sulfate (FEROSUL) 325 (65 FE) MG tablet Take 1 tablet (325 mg total) by mouth daily with breakfast. 90 tablet 2   labetalol (NORMODYNE) 200 MG tablet Take 1 tablet (200 mg total) by mouth 2 (two) times daily. Must keep upcoming appointment for additional refills 180 tablet 3   metFORMIN (GLUCOPHAGE) 500 MG tablet TAKE TWO TABLETS BY MOUTH TWICE DAILY WITH FOOD 360 tablet 1   Omega-3 Fatty Acids (FISH OIL PO) Take by mouth.     rosuvastatin (CRESTOR) 40 MG tablet Take 1 tablet (40 mg total) by mouth daily. 90 tablet 3   RYBELSUS 14 MG TABS TAKE 1 TABLET(14 MG) BY MOUTH DAILY 30 tablet 2   spironolactone (ALDACTONE) 25 MG tablet TAKE ONE TABLET ONCE DAILY 90 tablet 1   telmisartan (MICARDIS) 80 MG tablet Take 1 tablet (80 mg total) by mouth daily. 90 tablet 3   No current facility-administered medications on file prior to visit.     Review of Systems     Objective:  There were no vitals filed for this visit. BP Readings from Last 3 Encounters:   06/09/23 106/68  05/21/23 114/71  05/05/23 98/64   Wt Readings from Last 3 Encounters:  06/23/23 208 lb (94.3 kg)  06/09/23 208 lb 9.6 oz (94.6 kg)  05/21/23 210 lb 14.4 oz (95.7 kg)   There is no height or weight on file to calculate BMI.    Physical Exam     Lab Results  Component Value Date   WBC 6.9 05/21/2023   HGB 11.4 (L) 05/21/2023   HCT 33.9 (L) 05/21/2023   PLT 138 (L) 05/21/2023   GLUCOSE 185 (H) 05/21/2023   CHOL 95 12/22/2022   TRIG 238.0 (H) 12/22/2022   HDL 32.30 (L) 12/22/2022   LDLDIRECT 27.0 02/25/2022   LDLCALC 16 12/22/2022   ALT 22 05/21/2023   AST 16 05/21/2023   NA 140 05/21/2023   K 4.6 05/21/2023   CL 106 05/21/2023   CREATININE 1.30 (H) 05/21/2023   BUN 18 05/21/2023   CO2 26 05/21/2023   TSH 1.41 11/12/2018   PSA 2.61 11/12/2018   INR 1.0 08/15/2019   HGBA1C 7.3 (H) 12/22/2022   MICROALBUR 1.4 12/22/2022     Assessment & Plan:    See Problem List for Assessment and Plan of chronic medical problems.

## 2023-06-28 NOTE — Patient Instructions (Addendum)
      Blood work was ordered.       Medications changes include :   None    A referral was ordered and someone will call you to schedule an appointment.     Return in about 6 months (around 12/29/2023) for follow up.

## 2023-06-29 ENCOUNTER — Ambulatory Visit (INDEPENDENT_AMBULATORY_CARE_PROVIDER_SITE_OTHER): Admitting: Internal Medicine

## 2023-06-29 VITALS — BP 104/70 | HR 63 | Temp 98.1°F | Ht 68.0 in | Wt 205.0 lb

## 2023-06-29 DIAGNOSIS — D649 Anemia, unspecified: Secondary | ICD-10-CM | POA: Diagnosis not present

## 2023-06-29 DIAGNOSIS — E782 Mixed hyperlipidemia: Secondary | ICD-10-CM | POA: Diagnosis not present

## 2023-06-29 DIAGNOSIS — E538 Deficiency of other specified B group vitamins: Secondary | ICD-10-CM | POA: Diagnosis not present

## 2023-06-29 DIAGNOSIS — E1165 Type 2 diabetes mellitus with hyperglycemia: Secondary | ICD-10-CM

## 2023-06-29 DIAGNOSIS — Z7984 Long term (current) use of oral hypoglycemic drugs: Secondary | ICD-10-CM | POA: Diagnosis not present

## 2023-06-29 DIAGNOSIS — I10 Essential (primary) hypertension: Secondary | ICD-10-CM | POA: Diagnosis not present

## 2023-06-29 LAB — COMPREHENSIVE METABOLIC PANEL WITH GFR
ALT: 32 U/L (ref 0–53)
AST: 23 U/L (ref 0–37)
Albumin: 4.9 g/dL (ref 3.5–5.2)
Alkaline Phosphatase: 72 U/L (ref 39–117)
BUN: 23 mg/dL (ref 6–23)
CO2: 20 meq/L (ref 19–32)
Calcium: 10.4 mg/dL (ref 8.4–10.5)
Chloride: 109 meq/L (ref 96–112)
Creatinine, Ser: 1.21 mg/dL (ref 0.40–1.50)
GFR: 59.22 mL/min — ABNORMAL LOW (ref >60.00)
Glucose, Bld: 110 mg/dL — ABNORMAL HIGH (ref 70–99)
Potassium: 4.5 meq/L (ref 3.5–5.1)
Sodium: 140 meq/L (ref 135–145)
Total Bilirubin: 0.4 mg/dL (ref 0.2–1.2)
Total Protein: 7.2 g/dL (ref 6.0–8.3)

## 2023-06-29 LAB — LIPID PANEL
Cholesterol: 84 mg/dL (ref 0–200)
HDL: 31.4 mg/dL — ABNORMAL LOW (ref >39.00)
LDL Cholesterol: 11 mg/dL (ref 0–99)
NonHDL: 52.23
Total CHOL/HDL Ratio: 3
Triglycerides: 208 mg/dL — ABNORMAL HIGH (ref 0.0–149.0)
VLDL: 41.6 mg/dL — ABNORMAL HIGH (ref 0.0–40.0)

## 2023-06-29 LAB — HEMOGLOBIN A1C: Hgb A1c MFr Bld: 7.1 % — ABNORMAL HIGH (ref 4.6–6.5)

## 2023-06-29 LAB — VITAMIN B12: Vitamin B-12: 533 pg/mL (ref 211–911)

## 2023-06-29 MED ORDER — SPIRONOLACTONE 25 MG PO TABS: 12.5000 mg | ORAL_TABLET | Freq: Every day | ORAL | 1 refills | Status: AC

## 2023-06-29 NOTE — Assessment & Plan Note (Signed)
 Chronic  Lab Results  Component Value Date   HGBA1C 7.3 (H) 12/22/2022   Sugars not ideally controlled at last visit-goal less than 7.0% Check A1c Continue Jardiance 25 mg daily, metformin 1000 mg twice daily and Rybelsus 14 mg daily Stressed regular exercise, diabetic diet

## 2023-06-29 NOTE — Assessment & Plan Note (Signed)
 Chronic Taking B12 injections monthly Continue B12 supplementation

## 2023-06-29 NOTE — Assessment & Plan Note (Addendum)
 Chronic Mild, stable Iron deficiency - on oral iron Following with hematology Hematology considering bone marrow biopsy-will just monitor for now

## 2023-06-29 NOTE — Assessment & Plan Note (Addendum)
 Chronic Blood pressure well controlled - on the low side CMP Continue labetalol 200 mg twice daily, telmisartan 80 mg daily Decrease spironolactone to 12.5 mg daily

## 2023-06-29 NOTE — Assessment & Plan Note (Signed)
 Chronic Regular exercise and healthy diet encouraged Lab Results  Component Value Date   LDLCALC 16 12/22/2022   Check lipid panel Continue Crestor 40 mg daily

## 2023-07-01 ENCOUNTER — Encounter: Payer: Self-pay | Admitting: Internal Medicine

## 2023-07-03 ENCOUNTER — Encounter: Payer: Self-pay | Admitting: Family Medicine

## 2023-07-03 ENCOUNTER — Ambulatory Visit: Admitting: Family Medicine

## 2023-07-03 VITALS — BP 124/80 | HR 77 | Temp 98.6°F | Ht 68.0 in | Wt 206.6 lb

## 2023-07-03 DIAGNOSIS — J329 Chronic sinusitis, unspecified: Secondary | ICD-10-CM | POA: Diagnosis not present

## 2023-07-03 DIAGNOSIS — R051 Acute cough: Secondary | ICD-10-CM | POA: Diagnosis not present

## 2023-07-03 DIAGNOSIS — B9689 Other specified bacterial agents as the cause of diseases classified elsewhere: Secondary | ICD-10-CM | POA: Insufficient documentation

## 2023-07-03 DIAGNOSIS — R0981 Nasal congestion: Secondary | ICD-10-CM | POA: Diagnosis not present

## 2023-07-03 MED ORDER — HYDROCODONE BIT-HOMATROP MBR 5-1.5 MG/5ML PO SOLN: 5.0000 mL | Freq: Four times a day (QID) | ORAL | 0 refills | Status: DC | PRN

## 2023-07-03 MED ORDER — LEVOFLOXACIN 500 MG PO TABS: 500.0000 mg | ORAL_TABLET | Freq: Every day | ORAL | 0 refills | Status: AC

## 2023-07-03 MED ORDER — PREDNISONE 20 MG PO TABS
40.0000 mg | ORAL_TABLET | Freq: Every day | ORAL | 0 refills | Status: AC
Start: 2023-07-03 — End: 2023-07-08

## 2023-07-03 NOTE — Patient Instructions (Signed)
 I have sent in Levaquin 500mg  for you to take once daily for 7 days.   I have sent in hydrocodone cough syrup for you to take 5 mL once daily in the evening as needed for cough.  This medication may make you sleepy.  Do not drive or operate heavy machinery while taking this medication.  I have sent in prednisone for you to take 2 tablets once daily in the morning with breakfast for the next 5 days.  Follow-up with me for new or worsening symptoms.

## 2023-07-03 NOTE — Assessment & Plan Note (Signed)
 Using Levaquin given complex sinus and pulmonary history, multiple rounds of antibiotics over the last 2 months

## 2023-07-03 NOTE — Addendum Note (Signed)
 Addended by: Sherald Barge on: 07/03/2023 11:02 AM   Modules accepted: Orders

## 2023-07-03 NOTE — Progress Notes (Addendum)
 Acute Office Visit  Subjective:     Patient ID: Jonathon Armstrong, male    DOB: [birth year], 73 y.o.   MRN: [id number]  Chief Complaint  Patient presents with   Sinus Problem    HPI Patient is in today for evaluation of cough, sinus pain and congestion, fatigue, purulent nasal discharge, for the last 7 days. Has tried Zyrtec, Flonase, Mucinex, OTC cough and cold with little relief. Denies known sick contacts. Has been battling URI and sinus symptoms since the end of January when he had the flu and pneumonia. Has been treated with 2 rounds of doxycycline and prednisone in the last 2 months. Pulmonology to follow-up x-ray last month and appears of pneumonia has resolved, x-ray reviewed by me. Denies shortness of breath, wheezing with the exception of coughing fits. Has complex pulmonary history including left lung cancer with lower lobe resection. Denies abdominal pain, nausea, vomiting, diarrhea, rash, fever, chills, other symptoms.  Medical hx as outlined below.  ROS Per HPI      Objective:    BP 124/80 (BP Location: Left Arm, Patient Position: Sitting)   Pulse 77   Temp 98.6 F (37 C) (Temporal)   Ht 5\' 8"  (1.727 m)   Wt 206 lb 9.6 oz (93.7 kg)   SpO2 96%   BMI 31.41 kg/m    Physical Exam Vitals and nursing note reviewed.  Constitutional:      General: He is not in acute distress.    Comments: Appears fatigued  HENT:     Head: Normocephalic and atraumatic.     Nose: Congestion present.     Right Sinus: Maxillary sinus tenderness and frontal sinus tenderness present.     Left Sinus: Maxillary sinus tenderness and frontal sinus tenderness present.     Mouth/Throat:     Mouth: Mucous membranes are moist.     Pharynx: Oropharynx is clear. No oropharyngeal exudate or posterior oropharyngeal erythema.     Comments: Oropharyngeal cobblestoning   Eyes:     Extraocular Movements: Extraocular movements intact.  Cardiovascular:     Rate and Rhythm: Normal rate and  regular rhythm.     Heart sounds: Normal heart sounds.  Pulmonary:     Effort: Pulmonary effort is normal. No respiratory distress.     Breath sounds: Examination of the left-lower field reveals decreased breath sounds. Decreased breath sounds present. No wheezing, rhonchi or rales.     Comments: Productive cough Musculoskeletal:     Cervical back: Normal range of motion.  Lymphadenopathy:     Cervical: Cervical adenopathy present.  Neurological:     General: No focal deficit present.     Mental Status: He is alert and oriented to person, place, and time.     No results found for any visits on 07/03/23.      Assessment & Plan:   Bacterial sinusitis Assessment & Plan: Using Levaquin given complex sinus and pulmonary history, multiple rounds of antibiotics over the last 2 months  Orders: -     levoFLOXacin; Take 1 tablet (500 mg total) by mouth daily for 7 days.  Dispense: 7 tablet; Refill: 0 -     predniSONE; Take 2 tablets (40 mg total) by mouth daily for 5 days.  Dispense: 10 tablet; Refill: 0  Nasal congestion -     predniSONE; Take 2 tablets (40 mg total) by mouth daily for 5 days.  Dispense: 10 tablet; Refill: 0  Acute cough -     predniSONE; Take 2  tablets (40 mg total) by mouth daily for 5 days.  Dispense: 10 tablet; Refill: 0 -     HYDROcodone Bit-Homatrop MBr; Take 5 mLs by mouth every 6 (six) hours as needed for cough.  Dispense: 120 mL; Refill: 0     Meds ordered this encounter  Medications   levofloxacin (LEVAQUIN) 500 MG tablet    Sig: Take 1 tablet (500 mg total) by mouth daily for 7 days.    Dispense:  7 tablet    Refill:  0   predniSONE (DELTASONE) 20 MG tablet    Sig: Take 2 tablets (40 mg total) by mouth daily for 5 days.    Dispense:  10 tablet    Refill:  0   HYDROcodone bit-homatropine (HYCODAN) 5-1.5 MG/5ML syrup    Sig: Take 5 mLs by mouth every 6 (six) hours as needed for cough.    Dispense:  120 mL    Refill:  0    Return if symptoms  worsen or fail to improve.  Sherald Barge, FNP

## 2023-07-05 ENCOUNTER — Other Ambulatory Visit: Payer: Self-pay | Admitting: Internal Medicine

## 2023-07-08 ENCOUNTER — Other Ambulatory Visit: Payer: Self-pay | Admitting: *Deleted

## 2023-07-08 ENCOUNTER — Telehealth: Payer: Self-pay | Admitting: *Deleted

## 2023-07-08 DIAGNOSIS — D649 Anemia, unspecified: Secondary | ICD-10-CM

## 2023-07-08 NOTE — Telephone Encounter (Signed)
 Received call from pt. He states he is feeling very low in energy. He explained that he has been treated for pneumonia x2 in February and sinus infections x 2 in March. He is still feeling the need to take a nap every afternoon. Advised that it is not unusual to take awhile to get back to his baseline given the numerous infections he has had in the last 2 months. He has not had his CBC checked since her was here in February. Offered to give him a lab appt to do a CBC. He is agreeable to this. Appt set up for 07/14/23 @ 10 am

## 2023-07-14 ENCOUNTER — Inpatient Hospital Stay: Attending: Hematology and Oncology

## 2023-07-14 DIAGNOSIS — D509 Iron deficiency anemia, unspecified: Secondary | ICD-10-CM | POA: Insufficient documentation

## 2023-07-14 DIAGNOSIS — D649 Anemia, unspecified: Secondary | ICD-10-CM | POA: Insufficient documentation

## 2023-07-14 LAB — CBC WITH DIFFERENTIAL (CANCER CENTER ONLY)
Abs Immature Granulocytes: 0.07 10*3/uL (ref 0.00–0.07)
Basophils Absolute: 0 10*3/uL (ref 0.0–0.1)
Basophils Relative: 0 %
Eosinophils Absolute: 0.2 10*3/uL (ref 0.0–0.5)
Eosinophils Relative: 3 %
HCT: 32.3 % — ABNORMAL LOW (ref 39.0–52.0)
Hemoglobin: 11.2 g/dL — ABNORMAL LOW (ref 13.0–17.0)
Immature Granulocytes: 1 %
Lymphocytes Relative: 26 %
Lymphs Abs: 2.1 10*3/uL (ref 0.7–4.0)
MCH: 30.5 pg (ref 26.0–34.0)
MCHC: 34.7 g/dL (ref 30.0–36.0)
MCV: 88 fL (ref 80.0–100.0)
Monocytes Absolute: 0.5 10*3/uL (ref 0.1–1.0)
Monocytes Relative: 7 %
Neutro Abs: 5.1 10*3/uL (ref 1.7–7.7)
Neutrophils Relative %: 63 %
Platelet Count: 139 10*3/uL — ABNORMAL LOW (ref 150–400)
RBC: 3.67 MIL/uL — ABNORMAL LOW (ref 4.22–5.81)
RDW: 14.8 % (ref 11.5–15.5)
WBC Count: 8.1 10*3/uL (ref 4.0–10.5)
nRBC: 0 % (ref 0.0–0.2)

## 2023-07-22 ENCOUNTER — Telehealth: Payer: Self-pay | Admitting: *Deleted

## 2023-07-22 NOTE — Telephone Encounter (Signed)
 TCT patient regarding recent lab results. Spoke to him. Advised that his Hgb is stable in the 11s, around his baseline. We will plan to see him back in August 2025. Pt voiced understanding. He states he feels well. And he is aware of his appt in 10/2023

## 2023-07-22 NOTE — Telephone Encounter (Signed)
-----   Message from Rogerio Clay IV sent at 07/21/2023  5:31 PM EDT ----- Please let Mr. Klingerman know that his Hgb is stable in the 11s, around his baseline. We will plan to see him back in August 2025. ----- Message ----- From: Dannis Dy, [facility] [place] Sent: 07/14/2023  10:46 AM EDT To: Ander Bame, MD

## 2023-08-10 ENCOUNTER — Other Ambulatory Visit: Payer: Self-pay | Admitting: Internal Medicine

## 2023-08-27 ENCOUNTER — Other Ambulatory Visit: Payer: Self-pay | Admitting: Thoracic Surgery (Cardiothoracic Vascular Surgery)

## 2023-08-27 DIAGNOSIS — R911 Solitary pulmonary nodule: Secondary | ICD-10-CM

## 2023-09-19 ENCOUNTER — Other Ambulatory Visit: Payer: Self-pay | Admitting: Internal Medicine

## 2023-09-29 ENCOUNTER — Ambulatory Visit (HOSPITAL_COMMUNITY)
Admission: RE | Admit: 2023-09-29 | Discharge: 2023-09-29 | Disposition: A | Discharge status: Home / Self Care | Source: Ambulatory Visit | Attending: Cardiovascular Disease | Admitting: Cardiovascular Disease

## 2023-09-29 DIAGNOSIS — K8020 Calculus of gallbladder without cholecystitis without obstruction: Secondary | ICD-10-CM | POA: Diagnosis not present

## 2023-09-29 DIAGNOSIS — R918 Other nonspecific abnormal finding of lung field: Secondary | ICD-10-CM | POA: Insufficient documentation

## 2023-09-29 DIAGNOSIS — R911 Solitary pulmonary nodule: Secondary | ICD-10-CM | POA: Diagnosis present

## 2023-09-29 DIAGNOSIS — I700 Atherosclerosis of aorta: Secondary | ICD-10-CM | POA: Insufficient documentation

## 2023-09-29 DIAGNOSIS — I2510 Atherosclerotic heart disease of native coronary artery without angina pectoris: Secondary | ICD-10-CM | POA: Diagnosis not present

## 2023-09-29 DIAGNOSIS — I77819 Aortic ectasia, unspecified site: Secondary | ICD-10-CM | POA: Diagnosis not present

## 2023-09-29 DIAGNOSIS — J90 Pleural effusion, not elsewhere classified: Secondary | ICD-10-CM | POA: Diagnosis not present

## 2023-09-29 DIAGNOSIS — I77810 Thoracic aortic ectasia: Secondary | ICD-10-CM | POA: Diagnosis not present

## 2023-10-09 ENCOUNTER — Ambulatory Visit
Attending: Thoracic Surgery (Cardiothoracic Vascular Surgery) | Admitting: Thoracic Surgery (Cardiothoracic Vascular Surgery)

## 2023-10-09 DIAGNOSIS — Z902 Acquired absence of lung [part of]: Secondary | ICD-10-CM | POA: Diagnosis not present

## 2023-10-09 NOTE — Progress Notes (Signed)
      [address]       [place] [address]             [phone]        Patient: Home Provider: Office Consent for Telemedicine visit obtained.   Today's visit was completed via a real-time telehealth (see specific modality noted below). The patient/authorized person provided oral consent at the time of the visit to engage in a telemedicine encounter with the present provider at [facility]. The patient/authorized person was informed of the potential benefits, limitations, and risks of telemedicine. The patient/authorized person expressed understanding that the laws that protect confidentiality also apply to telemedicine. The patient/authorized person acknowledged understanding that telemedicine does not provide emergency services and that he or she would need to call 911 or proceed to the nearest hospital for help if such a need arose.   Total time spent in the clinical discussion 10 minutes.  Telehealth Modality: Phone visit (audio only)  I had a telephone visit with Mr. Pandit.  Overall he is doing well.  He is celebrating his 51th wedding anniversary tomorrow.  He denies any shortness of breath.  I personally reviewed his CT scan from this month.  There is no evidence of any recurrent or new disease.  Given that his surgery was in May 2021 we will with his surveillance to annual scans.  I will see him back in 1 year.

## 2023-10-14 ENCOUNTER — Other Ambulatory Visit: Payer: Self-pay | Admitting: Internal Medicine

## 2023-11-17 ENCOUNTER — Encounter: Payer: Self-pay | Admitting: Internal Medicine

## 2023-11-19 ENCOUNTER — Other Ambulatory Visit: Payer: Self-pay | Admitting: Hematology and Oncology

## 2023-11-19 ENCOUNTER — Inpatient Hospital Stay (HOSPITAL_BASED_OUTPATIENT_CLINIC_OR_DEPARTMENT_OTHER): Payer: Medicare Other | Admitting: Hematology and Oncology

## 2023-11-19 ENCOUNTER — Inpatient Hospital Stay: Payer: Medicare Other | Attending: Hematology and Oncology

## 2023-11-19 DIAGNOSIS — D649 Anemia, unspecified: Secondary | ICD-10-CM

## 2023-11-19 DIAGNOSIS — Z807 Family history of other malignant neoplasms of lymphoid, hematopoietic and related tissues: Secondary | ICD-10-CM | POA: Diagnosis not present

## 2023-11-19 DIAGNOSIS — Z808 Family history of malignant neoplasm of other organs or systems: Secondary | ICD-10-CM | POA: Diagnosis not present

## 2023-11-19 DIAGNOSIS — Z79899 Other long term (current) drug therapy: Secondary | ICD-10-CM | POA: Insufficient documentation

## 2023-11-19 DIAGNOSIS — D696 Thrombocytopenia, unspecified: Secondary | ICD-10-CM | POA: Insufficient documentation

## 2023-11-19 DIAGNOSIS — D508 Other iron deficiency anemias: Secondary | ICD-10-CM | POA: Diagnosis not present

## 2023-11-19 DIAGNOSIS — D509 Iron deficiency anemia, unspecified: Secondary | ICD-10-CM | POA: Diagnosis not present

## 2023-11-19 LAB — CBC WITH DIFFERENTIAL (CANCER CENTER ONLY)
Abs Immature Granulocytes: 0.01 K/uL (ref 0.00–0.07)
Basophils Absolute: 0 K/uL (ref 0.0–0.1)
Basophils Relative: 1 %
Eosinophils Absolute: 0.1 K/uL (ref 0.0–0.5)
Eosinophils Relative: 2 %
HCT: 32.4 % — ABNORMAL LOW (ref 39.0–52.0)
Hemoglobin: 11.2 g/dL — ABNORMAL LOW (ref 13.0–17.0)
Immature Granulocytes: 0 %
Lymphocytes Relative: 26 %
Lymphs Abs: 1.3 K/uL (ref 0.7–4.0)
MCH: 30.6 pg (ref 26.0–34.0)
MCHC: 34.6 g/dL (ref 30.0–36.0)
MCV: 88.5 fL (ref 80.0–100.0)
Monocytes Absolute: 0.4 K/uL (ref 0.1–1.0)
Monocytes Relative: 8 %
Neutro Abs: 3.1 K/uL (ref 1.7–7.7)
Neutrophils Relative %: 63 %
Platelet Count: 143 K/uL — ABNORMAL LOW (ref 150–400)
RBC: 3.66 MIL/uL — ABNORMAL LOW (ref 4.22–5.81)
RDW: 14.1 % (ref 11.5–15.5)
WBC Count: 4.9 K/uL (ref 4.0–10.5)
nRBC: 0 % (ref 0.0–0.2)

## 2023-11-19 LAB — CMP (CANCER CENTER ONLY)
ALT: 18 U/L (ref 0–44)
AST: 16 U/L (ref 15–41)
Albumin: 4.4 g/dL (ref 3.5–5.0)
Alkaline Phosphatase: 61 U/L (ref 38–126)
Anion gap: 6 (ref 5–15)
BUN: 24 mg/dL — ABNORMAL HIGH (ref 8–23)
CO2: 26 mmol/L (ref 22–32)
Calcium: 9.9 mg/dL (ref 8.9–10.3)
Chloride: 108 mmol/L (ref 98–111)
Creatinine: 1.28 mg/dL — ABNORMAL HIGH (ref 0.61–1.24)
GFR, Estimated: 59 mL/min — ABNORMAL LOW (ref >60)
Glucose, Bld: 180 mg/dL — ABNORMAL HIGH (ref 70–99)
Potassium: 4.5 mmol/L (ref 3.5–5.1)
Sodium: 140 mmol/L (ref 135–145)
Total Bilirubin: 0.4 mg/dL (ref 0.0–1.2)
Total Protein: 6.4 g/dL — ABNORMAL LOW (ref 6.5–8.1)

## 2023-11-19 LAB — RETIC PANEL
Immature Retic Fract: 15.1 % (ref 2.3–15.9)
RBC.: 3.7 MIL/uL — ABNORMAL LOW (ref 4.22–5.81)
Retic Count, Absolute: 74 K/uL (ref 19.0–186.0)
Retic Ct Pct: 2 % (ref 0.4–3.1)
Reticulocyte Hemoglobin: 33 pg (ref >27.9)

## 2023-11-19 LAB — IRON AND IRON BINDING CAPACITY (CC-WL,HP ONLY)
Iron: 64 ug/dL (ref 45–182)
Saturation Ratios: 17 % — ABNORMAL LOW (ref 17.9–39.5)
TIBC: 371 ug/dL (ref 250–450)
UIBC: 307 ug/dL (ref 117–376)

## 2023-11-19 LAB — FERRITIN: Ferritin: 112 ng/mL (ref 24–336)

## 2023-11-19 NOTE — Progress Notes (Signed)
 [facility] [phone]   [phone]  PROGRESS NOTE  Patient Care Team: Geofm Glade PARAS, MD as PCP - General (Internal Medicine) Pietro Redell RAMAN, MD as PCP - Cardiology (Cardiology) [place], Heather, OD as Consulting Physician (Optometry) Rozella, Toribio BROCKS, [facility] (Inactive) (Pharmacist)   CHIEF COMPLAINTS/PURPOSE OF CONSULTATION:  Normocytic Anemia of Unclear Etiology  HISTORY OF PRESENTING ILLNESS:  Jonathon Armstrong 73 y.o. male returns for a follow up for normocytic anemia/ iron deficiency anemia. He is unaccompanied for this visit.   On exam today, Jonathon Armstrong reports he has been well overall in the interim since our last visit.  He reports that his energy is strong and his appetite is good.  He is having no lightheadedness, dizziness, or shortness of breath.  He is doing his best to keep up with his very active young grandchildren.  He notes that he is looking forward to an upcoming trip to [place] [place] with his family over the Labor Day holiday.  He reports that he has had no other major changes in his health in the interim since her last visit.  His blood pressure medications were lowered because his BP was getting a little too low.  He is had no issues with bleeding, bruising, or dark stools.  He reports he is tolerating his iron pills well with no constipation or stomach upset.  He also does his best to eat red meat approximately 3-4 times per week.  Overall he feels well and has no additional questions concerns or complaints today.  A full 10 point ROS is otherwise negative.  MEDICAL HISTORY:  Past Medical History:  Diagnosis Date   Allergy    seasonal   Asthma 12/18/17   Asystole (HCC)    vagal responsepost nasal polypectomy   Cancer (HCC) 2019-12-19   Lobe on lower left lung removed   Complication of anesthesia 12-18-04   Coded x2 after nasal polyp removal (Believes vagal nerve response)   Diabetes mellitus without complication (HCC)    pre but take  metformin    Diverticulosis of colon    FH: colonic polyps    hx of   Hyperlipidemia    Hypertension    Mitral regurgitation    mild & aortic Valve calcification on 2d echo;sbe prophylaxis   Parotitis, acute 19-Dec-2011   Seasonal rhinitis    Sleep apnea    CPAP    SURGICAL HISTORY: Past Surgical History:  Procedure Laterality Date   APPENDECTOMY     asytole post nasal polypectomy  03/24/2006   BIOPSY  03/09/2023   Procedure: BIOPSY;  Surgeon: Shila Gustav GAILS, MD;  Location: [facility];  Service: Gastroenterology;;   colonoscopy with polypectomy  03/24/2008   Dr Obie   ESOPHAGOGASTRODUODENOSCOPY (EGD) WITH PROPOFOL  N/A 11/10/2022   Procedure: ESOPHAGOGASTRODUODENOSCOPY (EGD) WITH PROPOFOL ;  Surgeon: Shila Gustav GAILS, MD;  Location: [facility];  Service: Gastroenterology;  Laterality: N/A;   ESOPHAGOGASTRODUODENOSCOPY (EGD) WITH PROPOFOL  N/A 03/09/2023   Procedure: ESOPHAGOGASTRODUODENOSCOPY (EGD) WITH PROPOFOL ;  Surgeon: Shila Gustav GAILS, MD;  Location: [facility];  Service: Gastroenterology;  Laterality: N/A;   HEMOSTASIS CLIP PLACEMENT  11/10/2022   Procedure: HEMOSTASIS CLIP PLACEMENT;  Surgeon: Shila Gustav GAILS, MD;  Location: [facility];  Service: Gastroenterology;;   heriorraphy     x2   HERNIA REPAIR     INTERCOSTAL NERVE BLOCK Left 08/16/2019   Procedure: Intercostal Nerve Block;  Surgeon: Shyrl Linnie KIDD, MD;  Location: [facility];  Service: Thoracic;  Laterality: Left;  LUNG LOBECTOMY Left    LLL   MIDDLE EAR SURGERY     TM replacement   NASAL SINUS SURGERY  1994, [id number],[id number]   X3   NODE DISSECTION  08/16/2019   Procedure: Node Dissection;  Surgeon: Shyrl Linnie KIDD, MD;  Location: [facility];  Service: Thoracic;;   POLYPECTOMY  11/10/2022   Procedure: POLYPECTOMY;  Surgeon: Shila Gustav GAILS, MD;  Location: [facility];  Service: Gastroenterology;;   TONSILLECTOMY AND ADENOIDECTOMY     VIDEO BRONCHOSCOPY N/A 08/16/2019   Procedure: VIDEO  BRONCHOSCOPY;  Surgeon: Shyrl Linnie KIDD, MD;  Location: [facility];  Service: Thoracic;  Laterality: N/A;    SOCIAL HISTORY: Social History   Socioeconomic History   Marital status: Married    Spouse name: [place]   Number of children: 2   Years of education: 18   Highest education level: Master's degree (e.g., MA, MS, MEng, MEd, MSW, MBA)  Occupational History   Occupation: Retired    Associate Professor: RETIRED  Tobacco Use   Smoking status: Never    Passive exposure: Past   Smokeless tobacco: Never  Vaping Use   Vaping status: Never Used  Substance and Sexual Activity   Alcohol use: Yes    Alcohol/week: 2.0 standard drinks of alcohol    Types: 1 Glasses of wine, 1 Shots of liquor per week    Comment: socially < 2/ week   Drug use: No   Sexual activity: Yes  Other Topics Concern   Not on file  Social History Narrative   Fun: Water, swimming, [employer]; Volunteer work    Denies abuse and feels safe at home.   Regular exercise      Lives with wife      Social Drivers of Health   Financial Resource Strain: Low Risk  (06/16/2023)   Overall Financial Resource Strain (CARDIA)    Difficulty of Paying Living Expenses: Not hard at all  Food Insecurity: No Food Insecurity (06/16/2023)   Hunger Vital Sign    Worried About Running Out of Food in the Last Year: Never true    Ran Out of Food in the Last Year: Never true  Transportation Needs: No Transportation Needs (06/16/2023)   PRAPARE - Administrator, Civil Service (Medical): No    Lack of Transportation (Non-Medical): No  Physical Activity: Sufficiently Active (06/16/2023)   Exercise Vital Sign    Days of Exercise per Week: 3 days    Minutes of Exercise per Session: 60 min  Stress: No Stress Concern Present (06/16/2023)   [employer] of Occupational Health - Occupational Stress Questionnaire    Feeling of Stress : Not at all  Social Connections: Socially Integrated (06/16/2023)   Social Connection and Isolation  Panel    Frequency of Communication with Friends and Family: More than three times a week    Frequency of Social Gatherings with Friends and Family: More than three times a week    Attends Religious Services: More than 4 times per year    Active Member of [employer] or Organizations: Yes    Attends Banker Meetings: More than 4 times per year    Marital Status: Married  Catering manager Violence: Not At Risk (06/23/2023)   Humiliation, Afraid, Rape, and Kick questionnaire    Fear of Current or Ex-Partner: No    Emotionally Abused: No    Physically Abused: No    Sexually Abused: No    FAMILY HISTORY: Family History  Problem Relation Age  of Onset   Cancer Mother        liver &multiple myeloma   Heart disease Mother        CABG, Angina   Hyperlipidemia Mother    Hypertension Father    Heart disease Father        MI in 89s   Dementia Father    COPD Sister    Other Sister        perforated bowel   Other Sister        cns aneurysm   Cancer Maternal Grandfather        bone   Diabetes Neg Hx    Stroke Neg Hx    Colon cancer Neg Hx     ALLERGIES:  is allergic to diltiazem hcl, diltiazem hcl, and penicillins.  MEDICATIONS:  Current Outpatient Medications  Medication Sig Dispense Refill   aspirin  EC 81 MG tablet Take 1 tablet (81 mg total) by mouth daily. Swallow whole. 90 tablet 3   Cholecalciferol (VITAMIN D-3 PO) Take by mouth daily.     Cyanocobalamin  (VITAMIN B-12 IJ) Inject 1,000 mcg as directed every 30 (thirty) days.     ferrous sulfate  (FEROSUL) 325 (65 FE) MG tablet Take 1 tablet (325 mg total) by mouth daily with breakfast. 90 tablet 2   HYDROcodone  bit-homatropine (HYCODAN) 5-1.5 MG/5ML syrup Take 5 mLs by mouth every 6 (six) hours as needed for cough. 120 mL 0   JARDIANCE  25 MG TABS tablet TAKE 1 TABLET DAILY 90 tablet 3   labetalol  (NORMODYNE ) 200 MG tablet Take 1 tablet (200 mg total) by mouth 2 (two) times daily. Must keep upcoming appointment for  additional refills 180 tablet 3   metFORMIN  (GLUCOPHAGE ) 500 MG tablet TAKE TWO TABLETS BY MOUTH TWICE DAILY WITH FOOD 360 tablet 1   rosuvastatin  (CRESTOR ) 40 MG tablet Take 1 tablet (40 mg total) by mouth daily. 90 tablet 3   RYBELSUS  14 MG TABS TAKE 1 TABLET(14 MG) BY MOUTH DAILY 30 tablet 2   spironolactone  (ALDACTONE ) 25 MG tablet Take 0.5 tablets (12.5 mg total) by mouth daily. 45 tablet 1   telmisartan  (MICARDIS ) 80 MG tablet Take 1 tablet (80 mg total) by mouth daily. 90 tablet 3   No current facility-administered medications for this visit.    REVIEW OF SYSTEMS:   Constitutional: ( - ) fevers, ( - )  chills , ( - ) night sweats Eyes: ( - ) blurriness of vision, ( - ) double vision, ( - ) watery eyes Ears, nose, mouth, throat, and face: ( - ) mucositis, ( - ) sore throat Respiratory: ( - ) cough, ( - ) dyspnea, ( - ) wheezes Cardiovascular: ( - ) palpitation, ( - ) chest discomfort, ( - ) lower extremity swelling Gastrointestinal:  ( - ) nausea, ( - ) heartburn, ( - ) change in bowel habits Skin: ( - ) abnormal skin rashes Lymphatics: ( - ) new lymphadenopathy, ( - ) easy bruising Neurological: ( - ) numbness, ( - ) tingling, ( - ) new weaknesses Behavioral/Psych: ( - ) mood change, ( - ) new changes  All other systems were reviewed with the patient and are negative.  PHYSICAL EXAMINATION: ECOG PERFORMANCE STATUS: 0 - Asymptomatic  There were no vitals filed for this visit. There were no vitals filed for this visit.  GENERAL: well appearing male in NAD  SKIN: skin color, texture, turgor are normal, no rashes or significant lesions EYES: conjunctiva are pink and non-injected,  sclera clear LUNGS: clear to auscultation and percussion with normal breathing effort HEART: regular rate & rhythm and no murmurs and no lower extremity edema Musculoskeletal: no cyanosis of digits and no clubbing  PSYCH: alert & oriented x 3, fluent speech NEURO: no focal motor/sensory  deficits  LABORATORY DATA:  I have reviewed the data as listed    Latest Ref Rng & Units 11/19/2023   10:41 AM 07/14/2023   10:34 AM 05/21/2023    9:51 AM  CBC  WBC 4.0 - 10.5 K/uL 4.9  8.1  6.9   Hemoglobin 13.0 - 17.0 g/dL 88.7  88.7  88.5   Hematocrit 39.0 - 52.0 % 32.4  32.3  33.9   Platelets 150 - 400 K/uL 143  139  138        Latest Ref Rng & Units 11/19/2023   10:41 AM 06/29/2023   12:13 PM 05/21/2023    9:51 AM  CMP  Glucose 70 - 99 mg/dL 819  889  814   BUN 8 - 23 mg/dL 24  23  18    Creatinine 0.61 - 1.24 mg/dL 8.71  8.78  8.69   Sodium 135 - 145 mmol/L 140  140  140   Potassium 3.5 - 5.1 mmol/L 4.5  4.5  4.6   Chloride 98 - 111 mmol/L 108  109  106   CO2 22 - 32 mmol/L 26  20  26    Calcium  8.9 - 10.3 mg/dL 9.9  89.5  9.6   Total Protein 6.5 - 8.1 g/dL 6.4  7.2  6.5   Total Bilirubin 0.0 - 1.2 mg/dL 0.4  0.4  0.4   Alkaline Phos 38 - 126 U/L 61  72  62   AST 15 - 41 U/L 16  23  16    ALT 0 - 44 U/L 18  32  22    ASSESSMENT & PLAN Jonathon Armstrong is a 73 y.o. male returns for a follow up for iron deficiency anemia.   #Normocytic anemia: #Iron deficiency anemia: --Etiology unknown. Underwent EGD and colonoscopy on 06/18/2022. EGD showed single mucosal nodule in GE junction that was negative for dysplasia or malignancy. Otherwise normal EGD.  Colonoscopy showed several polyps that were negative for dysplasia or malignancy, moderate diverticulosis without evidence of bleeding, non-bleeding external and internal hemorrhoids.  --Currently on PO iron daily without any difficulty --Labs from today show persistent anemia with Hgb 11.2, WBC 4.9, MCV 88.5, Plt 143 --if iron levels are persistently low, recommend IV iron to help bolster his iron levels.  --Given the persistence of his normocytic anemia without clear iron deficiency I would recommend considering a bone marrow biopsy.  We have not found any clear reason why his hemoglobin is below normal limits. --Patient opts for  observation at this time and declines bone marrow biopsy unless absolutely necessary.  If hemoglobin is consistently below 11 would recommend consideration of this procedure. --RTC in 6 months with 8-month interval labs.  #Thrombocytopenia-stable --Etiology unknown but possible immune mediated process since immature platelet fraction was elevated on 03/25/2022. Remaining workup was negative for vitamin B12 and folate deficiencies or hepatitis B/C. --Platelet counts are back to normal, appear to fluctuate from low normal to slightly low. --No further workup required at this time.   No orders of the defined types were placed in this encounter.   All questions were answered. The patient knows to call the clinic with any problems, questions or concerns.  I have spent a total  of 30 minutes minutes of face-to-face and non-face-to-face time, preparing to see the patient, performing a medically appropriate examination, counseling and educating the patient, ordering meds,  documenting clinical information in the electronic health record, and care coordination.   Norleen IVAR Kidney, MD Department of Hematology/Oncology [facility] Phone: [phone] Pager: [phone] Email: [email or web address]

## 2023-11-26 DIAGNOSIS — D1801 Hemangioma of skin and subcutaneous tissue: Secondary | ICD-10-CM | POA: Diagnosis not present

## 2023-11-26 DIAGNOSIS — D2371 Other benign neoplasm of skin of right lower limb, including hip: Secondary | ICD-10-CM | POA: Diagnosis not present

## 2023-11-26 DIAGNOSIS — D225 Melanocytic nevi of trunk: Secondary | ICD-10-CM | POA: Diagnosis not present

## 2023-11-26 DIAGNOSIS — L812 Freckles: Secondary | ICD-10-CM | POA: Diagnosis not present

## 2023-11-26 DIAGNOSIS — L821 Other seborrheic keratosis: Secondary | ICD-10-CM | POA: Diagnosis not present

## 2023-11-26 DIAGNOSIS — D2262 Melanocytic nevi of left upper limb, including shoulder: Secondary | ICD-10-CM | POA: Diagnosis not present

## 2023-11-26 DIAGNOSIS — Z85828 Personal history of other malignant neoplasm of skin: Secondary | ICD-10-CM | POA: Diagnosis not present

## 2023-11-26 DIAGNOSIS — L570 Actinic keratosis: Secondary | ICD-10-CM | POA: Diagnosis not present

## 2023-11-26 DIAGNOSIS — D2261 Melanocytic nevi of right upper limb, including shoulder: Secondary | ICD-10-CM | POA: Diagnosis not present

## 2023-11-26 NOTE — Telephone Encounter (Signed)
**Note De-identified  [place] Obfuscation** Please advise 

## 2023-12-08 DIAGNOSIS — Z23 Encounter for immunization: Secondary | ICD-10-CM | POA: Diagnosis not present

## 2023-12-15 ENCOUNTER — Ambulatory Visit: Admitting: Acute Care

## 2023-12-15 ENCOUNTER — Encounter: Payer: Self-pay | Admitting: Acute Care

## 2023-12-15 VITALS — BP 119/68 | HR 77 | Temp 98.2°F | Wt 213.0 lb

## 2023-12-15 DIAGNOSIS — G4733 Obstructive sleep apnea (adult) (pediatric): Secondary | ICD-10-CM

## 2023-12-15 DIAGNOSIS — Z0000 Encounter for general adult medical examination without abnormal findings: Secondary | ICD-10-CM

## 2023-12-15 DIAGNOSIS — Z85118 Personal history of other malignant neoplasm of bronchus and lung: Secondary | ICD-10-CM

## 2023-12-15 NOTE — Patient Instructions (Addendum)
 It is good to see you today. We have reviewed your down Load results. They show great control of your OSA.  Keep up the good work. We will send in a prescription for a new machine and for all new supplies. Continue using your CPAP each night for at least 4 hours as you have been doing. Follow up in 6-8 weeks after you get your new machine to review down load results to ensure your download looks good. Call us  if you have any issues at all.  See you after you have your new machine.  Please contact office for sooner follow up if symptoms do not improve or worsen or seek emergency care

## 2023-12-15 NOTE — Progress Notes (Signed)
 History of Present Illness Jonathon Armstrong is a 73 y.o. male never smoker with past medical history of diabetes, hypertension, lung cancer , OSA and hyperlipidemia.   12/15/2023 Discussed the use of AI scribe software for clinical note transcription with the patient, who gave verbal consent to proceed.  History of Present Illness Jonathon Armstrong is a 73 year old male never smoker with past medical history of diabetes, hypertension, and hyperlipidemia.  He had a CT coronary of the heart which revealed a lower lobe pulmonary nodule. This nodule had low grade PET avidity and patient had bronchoscopy, robotic assisted left wedge resection and  left lower lobectomy 07/2019. Biopsy was positive for stage I adenocarcinoma. He is currently followed by Jonathon Armstrong with surveillance CT's . He was seen by Jonathon Armstrong during his lung nodule work up. Last seen in our office 10/04/2021 for acute cough.  He was seen  04/25/2023 for an acute visit for flu. He finished a previously prescribed prednisone  and Doxycycline treatment , and was seen again 05/05/2023 for continued wheezing, and his cough has increased, he has post nasal drip. We treated with a prednisone  taper, and he had a follow up CXR to ensure the pneumonia was resolved.   He is here today for follow up of his  obstructive sleep apnea and presents for CPAP supply management and review of therapy.  He has been experiencing issues with his CPAP mask, specifically noting that the rubber part has become stretched and leaks during the night. He attempted to order new supplies but was informed that his account was inactive due to not ordering supplies since 2022, necessitating a new prescription to reactivate his account.  He has been using a ResMed CPAP machine since 2019 with settings of 5 to 15 cm H2O, although he believes it is set at 4 cm H2O. He snores but has no morning headaches, choking, or gasping during sleep. His wife has witnessed  apneas prior to his CPAP initiation.Jonathon Armstrong He requires a new mask, headgear, and filters but does not need climate control tubing.  He had a sleep study in October 2019.  He received a flu vaccine last Tuesday in preparation for a trip to [place] . He inquires about the need for a COVID vaccine and confirms he had an RSV vaccine last year. He reports a past reaction to the shingles vaccine and is unsure about his pneumonia vaccine status, noting he had pneumonia earlier this year.  He shares a personal history of a neurological event two years ago, which was diagnosed as a non-MRI spinal event. He was advised against taking the second shingles shot due to a reaction. He is under the care of specialists at [facility] and [facility] for ongoing management of his symptoms.  We have reviewed his down Load. He has excellent compliance and AHI of 0.4. Excellent response to treatment.  His machine is 74 years old, so we will order a new machine and all supplies, as well as reinstate his account for supplies.       Test Results: AHI of 0.4 Excellent compliance  No break in therapy since initiation in 2019     Latest Ref Rng & Units 11/19/2023   10:41 AM 07/14/2023   10:34 AM 05/21/2023    9:51 AM  CBC  WBC 4.0 - 10.5 K/uL 4.9  8.1  6.9   Hemoglobin 13.0 - 17.0 g/dL 88.7  88.7  88.5   Hematocrit 39.0 - 52.0 % 32.4  32.3  33.9   Platelets 150 - 400 K/uL 143  139  138        Latest Ref Rng & Units 11/19/2023   10:41 AM 06/29/2023   12:13 PM 05/21/2023    9:51 AM  BMP  Glucose 70 - 99 mg/dL 819  889  814   BUN 8 - 23 mg/dL 24  23  18    Creatinine 0.61 - 1.24 mg/dL 8.71  8.78  8.69   Sodium 135 - 145 mmol/L 140  140  140   Potassium 3.5 - 5.1 mmol/L 4.5  4.5  4.6   Chloride 98 - 111 mmol/L 108  109  106   CO2 22 - 32 mmol/L 26  20  26    Calcium  8.9 - 10.3 mg/dL 9.9  89.5  9.6     BNP No results found for: BNP  ProBNP No results found for: PROBNP  PFT    Component Value Date/Time   FEV1PRE 2.47  08/12/2019 1447   FEV1POST 2.67 08/12/2019 1447   FVCPRE 3.13 08/12/2019 1447   FVCPOST 3.31 08/12/2019 1447   TLC 5.67 08/12/2019 1447   DLCOUNC 21.81 08/12/2019 1447   PREFEV1FVCRT 79 08/12/2019 1447   PSTFEV1FVCRT 81 08/12/2019 1447    No results found.   Past medical hx Past Medical History:  Diagnosis Date   Allergy    seasonal   Asthma 01/16/2018   Asystole (HCC)    vagal responsepost nasal polypectomy   Cancer (HCC) 01/17/20   Lobe on lower left lung removed   Complication of anesthesia 01/16/05   Coded x2 after nasal polyp removal (Believes vagal nerve response)   Diabetes mellitus without complication (HCC)    pre but take metformin    Diverticulosis of colon    FH: colonic polyps    hx of   Hyperlipidemia    Hypertension    Mitral regurgitation    mild & aortic Valve calcification on 2d echo;sbe prophylaxis   Parotitis, acute 2012-01-17   Seasonal rhinitis    Sleep apnea    CPAP     Social History   Tobacco Use   Smoking status: Never    Passive exposure: Past   Smokeless tobacco: Never  Vaping Use   Vaping status: Never Used  Substance Use Topics   Alcohol use: Yes    Alcohol/week: 2.0 standard drinks of alcohol    Types: 1 Glasses of wine, 1 Shots of liquor per week    Comment: socially < 2/ week   Drug use: No    JonathonArmstrong reports that he has never smoked. He has been exposed to tobacco smoke. He has never used smokeless tobacco. He reports current alcohol use of about 2.0 standard drinks of alcohol per week. He reports that he does not use drugs.  Tobacco Cessation: Counseling given: Not Answered Never smoker   Past surgical hx, Family hx, Social hx all reviewed.  Current Outpatient Medications on File Prior to Visit  Medication Sig   aspirin  EC 81 MG tablet Take 1 tablet (81 mg total) by mouth daily. Swallow whole.   Cholecalciferol (VITAMIN D-3 PO) Take by mouth daily.   Cyanocobalamin  (VITAMIN B-12 IJ) Inject 1,000 mcg as directed every 30 (thirty)  days.   ferrous sulfate  (FEROSUL) 325 (65 FE) MG tablet Take 1 tablet (325 mg total) by mouth daily with breakfast.   JARDIANCE  25 MG TABS tablet TAKE 1 TABLET DAILY   labetalol  (NORMODYNE ) 200 MG tablet Take 1 tablet (200  mg total) by mouth 2 (two) times daily. Must keep upcoming appointment for additional refills   metFORMIN  (GLUCOPHAGE ) 500 MG tablet TAKE TWO TABLETS BY MOUTH TWICE DAILY WITH FOOD   rosuvastatin  (CRESTOR ) 40 MG tablet Take 1 tablet (40 mg total) by mouth daily.   RYBELSUS  14 MG TABS TAKE 1 TABLET(14 MG) BY MOUTH DAILY   spironolactone  (ALDACTONE ) 25 MG tablet Take 0.5 tablets (12.5 mg total) by mouth daily.   telmisartan  (MICARDIS ) 80 MG tablet Take 1 tablet (80 mg total) by mouth daily.   No current facility-administered medications on file prior to visit.     Allergies  Allergen Reactions   Diltiazem Hcl Cough   Penicillins     Rash with Amoxicillin  with Mono in college    Review Of Systems:  Constitutional:   No  weight loss, night sweats,  Fevers, chills, fatigue, or  lassitude.  HEENT:   No headaches,  Difficulty swallowing,  Tooth/dental problems, or  Sore throat,                No sneezing, itching, ear ache, nasal congestion, post nasal drip,   CV:  No chest pain,  Orthopnea, PND, swelling in lower extremities, anasarca, dizziness, palpitations, syncope.   GI  No heartburn, indigestion, abdominal pain, nausea, vomiting, diarrhea, change in bowel habits, loss of appetite, bloody stools.   Resp: No shortness of breath with exertion or at rest.  No excess mucus, no productive cough,  No non-productive cough,  No coughing up of blood.  No change in color of mucus.  No wheezing.  No chest wall deformity  Skin: no rash or lesions.  GU: no dysuria, change in color of urine, no urgency or frequency.  No flank pain, no hematuria   MS:  No joint pain or swelling.  No decreased range of motion.  No back pain.  Psych:  No change in mood or affect. No depression  or anxiety.  No memory loss.   Vital Signs BP 119/68   Pulse 77   Temp 98.2 F (36.8 C) (Oral)   Wt 213 lb (96.6 kg)   SpO2 95%   BMI 32.39 kg/m    Physical Exam:  General- No distress,  A&Ox3, pleasant ENT: No sinus tenderness, TM clear, pale nasal mucosa, no oral exudate,no post nasal drip, no LAN Cardiac: S1, S2, regular rate and rhythm, no murmur Chest: No wheeze/ rales/ dullness; no accessory muscle use, no nasal flaring, no sternal retractions Abd.: Soft Non-tender, ND, BS +, Body mass index is 32.39 kg/m.  Ext: No clubbing cyanosis, edema, no obvious deformities Neuro:  normal strength, MAE x 4, A&O x 3 Skin: No rashes, warm and dry, NO obvious skin lesions  Psych: normal mood and behavior   Assessment/Plan Assessment & Plan Obstructive sleep apnea Obstructive sleep apnea well-controlled with CPAP. AHI 0.4. No therapy breaks since October 2019.  Occasional mask leaks.  No morning headaches or choking.  Plan - Order new CPAP supplies: mask, headgear, filters. - Order new CPAP machine as his is 73 years old. - Schedule follow-up 6-8 weeks post new CPAP machine for adjustment and efficacy.   General Health Maintenance Discussed vaccinations: flu, COVID-19, RSV, shingles, pneumonia. Received flu vaccine. Considering COVID-19 vaccine. RSV vaccine not needed annually. Advised against second shingles shot due to reaction. Pneumonia vaccine schedule age-dependent. - Consider COVID-19 vaccine if in crowds. - No RSV vaccine needed this year. - Avoid second shingles shot due to reaction. - Check pneumonia  vaccine schedule.  I spent 20 minutes dedicated to the care of this patient on the date of this encounter to include pre-visit review of records, face-to-face time with the patient discussing conditions above, post visit ordering of testing, clinical documentation with the electronic health record, making appropriate referrals as documented, and communicating necessary  information to the patient's healthcare team.   AVS  We have reviewed your down Load results. They show great control of your OSA.  Keep up the good work. We will send in a prescription for a new machine and for all new supplies. Continue using your CPAP each night for at least 4 hours as you have been doing. Follow up in 6-8 weeks after you get your new machine to review down load results to ensure your download looks good. Call us  if you have any issues at all.  See you after you have your new machine.  Please contact office for sooner follow up if symptoms do not improve or worsen or seek emergency care       Lauraine JULIANNA Lites, NP 12/15/2023  2:07 PM

## 2023-12-20 ENCOUNTER — Encounter: Payer: Self-pay | Admitting: Acute Care

## 2023-12-27 DIAGNOSIS — M79671 Pain in right foot: Secondary | ICD-10-CM | POA: Diagnosis not present

## 2023-12-27 DIAGNOSIS — M7661 Achilles tendinitis, right leg: Secondary | ICD-10-CM | POA: Diagnosis not present

## 2023-12-28 ENCOUNTER — Other Ambulatory Visit: Payer: Self-pay | Admitting: Internal Medicine

## 2023-12-28 ENCOUNTER — Ambulatory Visit (INDEPENDENT_AMBULATORY_CARE_PROVIDER_SITE_OTHER): Admitting: Internal Medicine

## 2023-12-28 ENCOUNTER — Ambulatory Visit
Admission: RE | Admit: 2023-12-28 | Discharge: 2023-12-28 | Disposition: A | Discharge status: Home / Self Care | Source: Ambulatory Visit | Attending: Internal Medicine | Admitting: Internal Medicine

## 2023-12-28 ENCOUNTER — Telehealth: Payer: Self-pay

## 2023-12-28 ENCOUNTER — Encounter: Payer: Self-pay | Admitting: Internal Medicine

## 2023-12-28 ENCOUNTER — Ambulatory Visit: Payer: Self-pay

## 2023-12-28 ENCOUNTER — Ambulatory Visit: Payer: Self-pay | Admitting: Internal Medicine

## 2023-12-28 VITALS — BP 120/70 | HR 67 | Temp 97.4°F | Ht 68.0 in | Wt 212.2 lb

## 2023-12-28 DIAGNOSIS — R109 Unspecified abdominal pain: Secondary | ICD-10-CM

## 2023-12-28 DIAGNOSIS — N132 Hydronephrosis with renal and ureteral calculous obstruction: Secondary | ICD-10-CM | POA: Diagnosis not present

## 2023-12-28 DIAGNOSIS — K8020 Calculus of gallbladder without cholecystitis without obstruction: Secondary | ICD-10-CM | POA: Diagnosis not present

## 2023-12-28 DIAGNOSIS — N201 Calculus of ureter: Secondary | ICD-10-CM

## 2023-12-28 LAB — COMPREHENSIVE METABOLIC PANEL WITH GFR
ALT: 16 U/L (ref 0–53)
AST: 15 U/L (ref 0–37)
Albumin: 4.6 g/dL (ref 3.5–5.2)
Alkaline Phosphatase: 74 U/L (ref 39–117)
BUN: 26 mg/dL — ABNORMAL HIGH (ref 6–23)
CO2: 24 meq/L (ref 19–32)
Calcium: 10 mg/dL (ref 8.4–10.5)
Chloride: 105 meq/L (ref 96–112)
Creatinine, Ser: 1.63 mg/dL — ABNORMAL HIGH (ref 0.40–1.50)
GFR: 41.28 mL/min — ABNORMAL LOW (ref >60.00)
Glucose, Bld: 125 mg/dL — ABNORMAL HIGH (ref 70–99)
Potassium: 4.5 meq/L (ref 3.5–5.1)
Sodium: 142 meq/L (ref 135–145)
Total Bilirubin: 0.6 mg/dL (ref 0.2–1.2)
Total Protein: 6.6 g/dL (ref 6.0–8.3)

## 2023-12-28 LAB — LIPASE: Lipase: 20 U/L (ref 11.0–59.0)

## 2023-12-28 LAB — CBC WITH DIFFERENTIAL/PLATELET
Basophils Absolute: 0 K/uL (ref 0.0–0.1)
Basophils Relative: 0.2 % (ref 0.0–3.0)
Eosinophils Absolute: 0 K/uL (ref 0.0–0.7)
Eosinophils Relative: 0.4 % (ref 0.0–5.0)
HCT: 33.8 % — ABNORMAL LOW (ref 39.0–52.0)
Hemoglobin: 11.5 g/dL — ABNORMAL LOW (ref 13.0–17.0)
Lymphocytes Relative: 10.5 % — ABNORMAL LOW (ref 12.0–46.0)
Lymphs Abs: 0.9 K/uL (ref 0.7–4.0)
MCHC: 34.1 g/dL (ref 30.0–36.0)
MCV: 87.6 fl (ref 78.0–100.0)
Monocytes Absolute: 0.6 K/uL (ref 0.1–1.0)
Monocytes Relative: 7.3 % (ref 3.0–12.0)
Neutro Abs: 6.7 K/uL (ref 1.4–7.7)
Neutrophils Relative %: 81.6 % — ABNORMAL HIGH (ref 43.0–77.0)
Platelets: 150 K/uL (ref 150.0–400.0)
RBC: 3.86 Mil/uL — ABNORMAL LOW (ref 4.22–5.81)
RDW: 14.4 % (ref 11.5–15.5)
WBC: 8.2 K/uL (ref 4.0–10.5)

## 2023-12-28 LAB — AMYLASE: Amylase: 48 U/L (ref 27–131)

## 2023-12-28 MED ORDER — CIPROFLOXACIN HCL 250 MG PO TABS: 250.0000 mg | ORAL_TABLET | Freq: Two times a day (BID) | ORAL | 0 refills | Status: AC

## 2023-12-28 MED ORDER — OXYCODONE-ACETAMINOPHEN 5-325 MG PO TABS: 1.0000 | ORAL_TABLET | ORAL | 0 refills | Status: AC | PRN

## 2023-12-28 MED ORDER — IOPAMIDOL (ISOVUE-300) INJECTION 61%
100.0000 mL | Freq: Once | INTRAVENOUS | Status: AC | PRN
  Administered 2023-12-28: 75 mL via INTRAVENOUS

## 2023-12-28 MED ORDER — TAMSULOSIN HCL 0.4 MG PO CAPS: 0.4000 mg | ORAL_CAPSULE | Freq: Every day | ORAL | 3 refills | Status: DC

## 2023-12-28 NOTE — Addendum Note (Signed)
 Addended by: GEOFM GLADE PARAS on: 12/28/2023 07:26 PM   Modules accepted: Level of Service

## 2023-12-28 NOTE — Telephone Encounter (Signed)
 Copied from CRM [id number]. Topic: Clinical - Lab/Test Results >> Dec 28, 2023  4:09 PM Harlene ORN wrote: Reason for CRM: Rep from [facility] called to relay a stat lab report to the Practice. Transferred to the office.

## 2023-12-28 NOTE — Telephone Encounter (Signed)
 FYI Only or Action Required?: FYI only for provider.  Patient was last seen in primary care on 07/03/2023 by Alvia Corean CROME, FNP.  Called Nurse Triage reporting Diarrhea.  Symptoms began several days ago.  Interventions attempted: OTC medications: Gas X.  Symptoms are: gradually worsening.  Triage Disposition: See HCP Within 4 Hours (Or PCP Triage)  Patient/caregiver understands and will follow disposition?: Yes      Copied from CRM [id number]. Topic: Clinical - Red Word Triage >> Dec 28, 2023  8:25 AM Suzen RAMAN wrote: Red Word that prompted transfer to Nurse Triage: PAIN AND DISCOMFORT IN GI SYSTEM ON LEFT SIDE. FOLLOWED BY DIARRHEA AND BLOATING  (STARTED LAST WEDNESDAY) ... REQUESTING AN APPT Reason for Disposition  [1] Constant abdominal pain AND [2] present > 2 hours  Answer Assessment - Initial Assessment Questions Took gas X for symptoms without relief.   1. DIARRHEA SEVERITY: How bad is the diarrhea? How many more stools have you had in the past 24 hours than normal?      0 episodes  2. ONSET: When did the diarrhea begin?      Last Wednesday  3. STOOL DESCRIPTION:  How loose or watery is the diarrhea? What is the stool color? Is there any blood or mucous in the stool?     Mostly like diarrhea denies blood in stool  4. VOMITING: Are you also vomiting? If Yes, ask: How many times in the past 24 hours?      0 episodes   5. ABDOMEN PAIN: Are you having any abdomen pain? If Yes, ask: What does it feel like? (e.g., crampy, dull, intermittent, constant)      Constant pressure with bloating  6. ABDOMEN PAIN SEVERITY: If present, ask: How bad is the pain?  (e.g., Scale 1-10; mild, moderate, or severe)     8 or 9 out of 10  7. ORAL INTAKE: If vomiting, Have you been able to drink liquids? How much liquids have you had in the past 24 hours?     Able to drink fluids  8. HYDRATION: Any signs of dehydration? (e.g., dry mouth [not just dry lips], too  weak to stand, dizziness, new weight loss) When did you last urinate?     Denies but has had a headache  9. EXPOSURE: Have you traveled to a foreign country recently? Have you been exposed to anyone with diarrhea? Could you have eaten any food that was spoiled?     Traveled recently states his wife is not sick  10. OTHER SYMPTOMS: Do you have any other symptoms? (e.g., fever, blood in stool)       Nausea, vomiting, bloating in abdomen area, belching and burping  Protocols used: Diarrhea-A-AH

## 2023-12-28 NOTE — Patient Instructions (Signed)
      Blood work was ordered.        CT scan ordered

## 2023-12-28 NOTE — Assessment & Plan Note (Addendum)
 Acute Started 5 days ago Associated with abdominal bloating, increased gas, belching, episode of dry heaves, couple episodes of diarrhea Pain currently 8/10, abdomen very tender on left side of abdomen and less tender diffusely, positive rebound, negative guarding No urinary symptoms Likely GI related-diverticulitis possible-last colonoscopy showed diverticulosis, pancreatitis, colitis, enteritis, cholecystitis all possible Stat CT abdomen pelvis CBC, CMP, lipase, amylase stat  Depending on above results  Ct scan shows severe left hydronephrosis due to obs 4 mm distal left ureteral stone.  Sent in flomax 1 pill daily, cipro  250 mg bid x 7 days, oxycodone -acet 5-325 mg 1 tab Q 4 hr prn Urgent referral to urology

## 2023-12-28 NOTE — Progress Notes (Addendum)
 Subjective:    Patient ID: Jonathon Armstrong, male    DOB: 07-19-1950, 73 y.o.   MRN: [id number]      HPI Jonathon Armstrong is here for  Chief Complaint  Patient presents with   Acute Visit    GI sx-Diarrhea, nausea, vomiting, bloating and pressure, extreme belching x5 days, no appetite, has taken otc GasX not helping    Last Wednesday symptoms started about 10 pm.  He was out of town sudden severe pain in his left mid-quadrant radiating to central abd.  It felt like gas pain and he was belching a lot.  Past lasted 3-4 hours and then became more tolerable.  Having severe belching, bad breath.  He had dry heaves.      The pain became tolerable.   Again flared up the following night - same symptoms - severe x 3-4 hours.    Gax ex helped a little - lessend the pressure.  Not overeating - no change in meds and did not eat anything out of the ordinary.   Thursday he had a bout of diarrhea - non-bloody, no melena.  Had a small episode of diarrhea two days ago.  No BM since then.   Symptoms returned last night.  No nausea.  Pain now is 8/10.  Has headache today.  Ate dinner last night.  Has not eaten today   Medications and allergies reviewed with patient and updated if appropriate.  Current Outpatient Medications on File Prior to Visit  Medication Sig Dispense Refill   aspirin  EC 81 MG tablet Take 1 tablet (81 mg total) by mouth daily. Swallow whole. 90 tablet 3   Cholecalciferol (VITAMIN D-3 PO) Take by mouth daily.     Cyanocobalamin  (VITAMIN B-12 IJ) Inject 1,000 mcg as directed every 30 (thirty) days.     ferrous sulfate  (FEROSUL) 325 (65 FE) MG tablet Take 1 tablet (325 mg total) by mouth daily with breakfast. 90 tablet 2   JARDIANCE  25 MG TABS tablet TAKE 1 TABLET DAILY 90 tablet 3   labetalol  (NORMODYNE ) 200 MG tablet Take 1 tablet (200 mg total) by mouth 2 (two) times daily. Must keep upcoming appointment for additional refills 180 tablet 3   metFORMIN  (GLUCOPHAGE ) 500 MG tablet  TAKE TWO TABLETS BY MOUTH TWICE DAILY WITH FOOD 360 tablet 1   rosuvastatin  (CRESTOR ) 40 MG tablet Take 1 tablet (40 mg total) by mouth daily. 90 tablet 3   RYBELSUS  14 MG TABS TAKE 1 TABLET(14 MG) BY MOUTH DAILY 30 tablet 2   spironolactone  (ALDACTONE ) 25 MG tablet Take 0.5 tablets (12.5 mg total) by mouth daily. 45 tablet 1   telmisartan  (MICARDIS ) 80 MG tablet Take 1 tablet (80 mg total) by mouth daily. 90 tablet 3   meloxicam  (MOBIC ) 15 MG tablet Take 15 mg by mouth daily as needed. (Patient not taking: Reported on 12/28/2023)     methylPREDNISolone (MEDROL DOSEPAK) 4 MG TBPK tablet Take by mouth as directed. (Patient not taking: Reported on 12/28/2023)     No current facility-administered medications on file prior to visit.    Review of Systems  Constitutional:  Positive for chills (this morning). Negative for fever.  Respiratory:  Negative for shortness of breath.   Cardiovascular:  Negative for chest pain and palpitations.  Gastrointestinal:  Positive for abdominal pain, diarrhea and vomiting (dry heaves). Negative for blood in stool (no melena) and nausea.       Belching, increased gas,  no gerd  Genitourinary:  Negative  for difficulty urinating, dysuria, frequency, hematuria and urgency.  Neurological:  Positive for headaches (this morning). Negative for dizziness and light-headedness.       Objective:   Vitals:   12/28/23 0957  BP: 120/70  Pulse: 67  Temp: (!) 97.4 F (36.3 C)  SpO2: 97%   BP Readings from Last 3 Encounters:  12/28/23 120/70  12/15/23 119/68  07/03/23 124/80   Wt Readings from Last 3 Encounters:  12/28/23 212 lb 3.2 oz (96.3 kg)  12/15/23 213 lb (96.6 kg)  07/03/23 206 lb 9.6 oz (93.7 kg)   Body mass index is 32.26 kg/m.    Physical Exam Constitutional:      General: He is not in acute distress.    Appearance: Normal appearance. He is not ill-appearing.  HENT:     Head: Normocephalic and atraumatic.  Cardiovascular:     Rate and Rhythm:  Normal rate and regular rhythm.  Pulmonary:     Effort: Pulmonary effort is normal.     Breath sounds: Normal breath sounds.  Abdominal:     General: There is no distension.     Palpations: Abdomen is soft. There is no mass.     Tenderness: There is abdominal tenderness (left mostly on left side of abdomen,  less tenderness diffusely). There is rebound. There is no left CVA tenderness.     Hernia: A hernia (umbilical - nontender) is present.  Musculoskeletal:     Right lower leg: No edema.     Left lower leg: No edema.  Skin:    General: Skin is warm and dry.  Neurological:     Mental Status: He is alert.        CT ABDOMEN PELVIS W CONTRAST EXAM: CT ABDOMEN AND PELVIS WITH CONTRAST 12/28/2023 03:22:31 PM  TECHNIQUE: CT of the abdomen and pelvis was performed with the administration of 75 mL of iopamidol (ISOVUE-300) 61 % injection. Multiplanar reformatted images are provided for review. Automated exposure control, iterative reconstruction, and/or weight-based adjustment of the mA/kV was utilized to reduce the radiation dose to as low as reasonably achievable.  COMPARISON: None available.  CLINICAL HISTORY: LLQ abdominal pain; left mid quadrant pain radiating to center x 5 days, has diverticulosis. Isovue 300/75ml from bottle, 25ml wasted; GFR 41/ Creat: 1.63 today reduced dosing used; LLQ pain and bloating in abd x 5 days; Hx diverticulosis; Adenocarcinoma of lung 5/21, surgery only; HTN on meds, DM on Metformin   FINDINGS:  LOWER CHEST: No acute abnormality.  LIVER: No hepatic calculi. The liver is otherwise unremarkable.  GALLBLADDER AND BILE DUCTS: Several small gallstones with gallbladder distention. Trace pericholecystic fluid. No biliary ductal dilatation.  SPLEEN: No acute abnormality.  PANCREAS: No acute abnormality.  ADRENAL GLANDS: No acute abnormality.  KIDNEYS, URETERS AND BLADDER: Severe left hydronephrosis. There is an obstructing  calculus in the distal left ureter measuring 4 mm on image 74 of series 2. This distal left ureteral calculus is approximately 3 cm from the left vesicoureteral junction. No additional left ureteral calculi are identified. No stones in the right kidney or right ureter. Perinephric stranding in the left perirenal space. \  There are bilateral simple fluid-attenuation renal cysts on postcontrast imaging. For example, a 2.5 cm cyst in the right kidney with Hounsfield unit of 5. Per consensus, no follow-up is needed for simple Bosniak type 1 and 2 renal cysts, unless the patient has a malignancy history or risk factors. Urinary bladder is unremarkable.  GI AND BOWEL: There are diverticula  throughout the colon. Stomach demonstrates no acute abnormality. There is no bowel obstruction.  PERITONEUM AND RETROPERITONEUM: No ascites. No free air.  VASCULATURE: Aorta is normal in caliber.  LYMPH NODES: No lymphadenopathy.  REPRODUCTIVE ORGANS: No acute abnormality.  BONES AND SOFT TISSUES: No acute osseous abnormality. No focal soft tissue abnormality.  IMPRESSION: 1. Severe left hydronephrosis due to an obstructing 4 mm distal left ureteral calculus approximately 3 cm from the left vesicoureteral junction with associated left perinephric stranding. No additional left ureteral calculi identified. 2. Cholelithiasis with gallbladder distention and trace pericholecystic fluid. No biliary ductal dilatation. 3. Colonic diverticulosis without evidence of diverticulitis. 4. These results will be called to the ordering clinician or representative by the radiologist assistant, and communication documented in the pacs or clario dashboard  Electronically signed by: Norleen Boxer MD 12/28/2023 04:00 PM EDT RP Workstation: [id number]      Assessment & Plan:    See Problem List for Assessment and Plan of chronic medical problems.

## 2023-12-29 ENCOUNTER — Encounter: Payer: Self-pay | Admitting: Internal Medicine

## 2023-12-31 DIAGNOSIS — N201 Calculus of ureter: Secondary | ICD-10-CM | POA: Diagnosis not present

## 2024-01-13 ENCOUNTER — Other Ambulatory Visit: Payer: Self-pay | Admitting: Internal Medicine

## 2024-01-13 DIAGNOSIS — Z23 Encounter for immunization: Secondary | ICD-10-CM | POA: Diagnosis not present

## 2024-02-08 DIAGNOSIS — M1711 Unilateral primary osteoarthritis, right knee: Secondary | ICD-10-CM | POA: Diagnosis not present

## 2024-02-10 DIAGNOSIS — E119 Type 2 diabetes mellitus without complications: Secondary | ICD-10-CM | POA: Diagnosis not present

## 2024-02-11 ENCOUNTER — Other Ambulatory Visit: Payer: Self-pay | Admitting: Cardiology

## 2024-02-11 DIAGNOSIS — R931 Abnormal findings on diagnostic imaging of heart and coronary circulation: Secondary | ICD-10-CM

## 2024-02-11 DIAGNOSIS — E7800 Pure hypercholesterolemia, unspecified: Secondary | ICD-10-CM

## 2024-02-26 ENCOUNTER — Encounter: Payer: Self-pay | Admitting: Gastroenterology

## 2024-03-04 ENCOUNTER — Telehealth: Payer: Self-pay

## 2024-03-04 ENCOUNTER — Other Ambulatory Visit: Payer: Self-pay | Admitting: Cardiology

## 2024-03-04 NOTE — Telephone Encounter (Signed)
 Dr.Nandigam, This patient is currently scheduled for a recall EGD, however, his last 2 procedures were completed at the hospital.  Does he need to have this procedure at the hospital as well or can he proceed with scheduled endo at [facility]? Please advise Please/thank you Bre, PV RN

## 2024-03-05 ENCOUNTER — Other Ambulatory Visit: Payer: Self-pay | Admitting: Cardiology

## 2024-03-05 DIAGNOSIS — I10 Essential (primary) hypertension: Secondary | ICD-10-CM

## 2024-03-08 ENCOUNTER — Other Ambulatory Visit: Payer: Self-pay | Admitting: Cardiology

## 2024-03-08 DIAGNOSIS — I10 Essential (primary) hypertension: Secondary | ICD-10-CM

## 2024-03-14 ENCOUNTER — Other Ambulatory Visit: Payer: Self-pay | Admitting: Cardiology

## 2024-03-14 DIAGNOSIS — R931 Abnormal findings on diagnostic imaging of heart and coronary circulation: Secondary | ICD-10-CM

## 2024-03-14 DIAGNOSIS — E7800 Pure hypercholesterolemia, unspecified: Secondary | ICD-10-CM

## 2024-03-15 NOTE — Telephone Encounter (Signed)
 He can be scheduled for procedure at [facility].  Thanks

## 2024-03-16 NOTE — Telephone Encounter (Signed)
Noted on PV chart ?

## 2024-03-19 ENCOUNTER — Encounter: Payer: Self-pay | Admitting: Cardiology

## 2024-03-21 ENCOUNTER — Encounter: Payer: Self-pay | Admitting: Cardiology

## 2024-03-21 DIAGNOSIS — I10 Essential (primary) hypertension: Secondary | ICD-10-CM

## 2024-03-21 MED ORDER — TELMISARTAN 80 MG PO TABS: 80.0000 mg | ORAL_TABLET | Freq: Every day | ORAL | 0 refills | Status: AC

## 2024-03-22 ENCOUNTER — Other Ambulatory Visit: Payer: Self-pay | Admitting: Cardiology

## 2024-03-22 ENCOUNTER — Ambulatory Visit: Admitting: *Deleted

## 2024-03-22 VITALS — Ht 70.0 in | Wt 212.0 lb

## 2024-03-22 DIAGNOSIS — K219 Gastro-esophageal reflux disease without esophagitis: Secondary | ICD-10-CM

## 2024-03-22 DIAGNOSIS — D649 Anemia, unspecified: Secondary | ICD-10-CM

## 2024-03-22 NOTE — Progress Notes (Addendum)
 Pt's name and DOB verified at the beginning of the pre-visit with 2 identifiers   Pt denies any difficulty with ambulating,sitting, laying down or rolling side to side  Pt has no issues moving head neck or swallowing  No egg or soy allergy known to patient   One of vagal episode  No FH of Malignant Hyperthermia  Pt is not on home 02   Pt is not on blood thinners   Pt denies issues with constipation   Pt is not on dialysis  Pt denise any abnormal heart rhythms   Pt denies any upcoming cardiac testing  Patient's chart reviewed by Norleen Schillings CNRA prior to pre-visit and patient appropriate for the [facility].  Pre-visit completed and red dot placed by patient's name on their procedure day (on provider's schedule).    Visit in person  Pt states weight is 212 lb  Pt given  both [facility] main # and MD on call # prior to instructions.  Informed pt to come in at the time discussed and is shown on PV instructions.  Pt instructed to use Single[employer] or GoodRx for a price reduction on prep  Instructed pt where to find PV instructions in My Chart and a copy given to pt Instructed pt on all aspects of written instructions including med holds clothing to wear and foods to eat and not eat as well as after procedure legal restrictions and to call MD on call if needed.. Pt states understanding. Instructed pt to review instructions again prior to procedure and call main # given if has any questions or any issues. Pt states they will.

## 2024-03-25 ENCOUNTER — Other Ambulatory Visit: Payer: Self-pay | Admitting: Internal Medicine

## 2024-03-29 ENCOUNTER — Encounter: Payer: Self-pay | Admitting: Physician Assistant

## 2024-04-05 ENCOUNTER — Ambulatory Visit: Admitting: Gastroenterology

## 2024-04-05 ENCOUNTER — Encounter: Payer: Self-pay | Admitting: Gastroenterology

## 2024-04-05 VITALS — BP 122/66 | HR 70 | Temp 97.3°F | Resp 22 | Ht 70.0 in | Wt 212.0 lb

## 2024-04-05 DIAGNOSIS — K219 Gastro-esophageal reflux disease without esophagitis: Secondary | ICD-10-CM | POA: Diagnosis not present

## 2024-04-05 DIAGNOSIS — K2970 Gastritis, unspecified, without bleeding: Secondary | ICD-10-CM

## 2024-04-05 DIAGNOSIS — K2270 Barrett's esophagus without dysplasia: Secondary | ICD-10-CM

## 2024-04-05 DIAGNOSIS — D649 Anemia, unspecified: Secondary | ICD-10-CM

## 2024-04-05 DIAGNOSIS — K2100 Gastro-esophageal reflux disease with esophagitis, without bleeding: Secondary | ICD-10-CM

## 2024-04-05 MED ORDER — SODIUM CHLORIDE 0.9 % IV SOLN: 500.0000 mL | Freq: Once | INTRAVENOUS | Status: DC

## 2024-04-05 MED ORDER — PANTOPRAZOLE SODIUM 40 MG PO TBEC: 40.0000 mg | DELAYED_RELEASE_TABLET | Freq: Every day | ORAL | 3 refills | Status: AC

## 2024-04-05 NOTE — Progress Notes (Unsigned)
 [facility] History and Physical   Primary Care Physician:  Geofm Glade PARAS, MD   Reason for Procedure:  F/u esophagitis, Barrett's esophagus  Plan:    EGD  with possible interventions as needed     HPI: Jonathon Armstrong is a very pleasant 74 y.o. male here for follow up of esophagitis and Barrett's esophagus. S/p removal of esophageal nodule   The risks and benefits as well as alternatives of endoscopic procedure(s) have been discussed and reviewed.  The patient was provided an opportunity to ask questions and all were answered. The patient agreed with the plan and demonstrated an understanding of the instructions.   Past Medical History:  Diagnosis Date   Allergy    seasonal   Asthma 2017/05/11   Asystole (HCC)    vagal responsepost nasal polypectomy   Cancer (HCC) 05-12-2019   Lobe on lower left lung removed   Complication of anesthesia May 11, 2004   Coded x2 after nasal polyp removal (Believes vagal nerve response)   Diabetes mellitus without complication (HCC)    pre but take metformin    Diverticulosis of colon    FH: colonic polyps    hx of   Hyperlipidemia    Hypertension    Mitral regurgitation    mild & aortic Valve calcification on 2d echo;sbe prophylaxis   Parotitis, acute 05-12-2011   Seasonal rhinitis    Sleep apnea    CPAP    Past Surgical History:  Procedure Laterality Date   APPENDECTOMY     asytole post nasal polypectomy  03/24/2006   BIOPSY  03/09/2023   Procedure: BIOPSY;  Surgeon: Shila Gustav GAILS, MD;  Location: [facility];  Service: Gastroenterology;;   colonoscopy with polypectomy  03/24/2008   Dr Obie   ESOPHAGOGASTRODUODENOSCOPY (EGD) WITH PROPOFOL  N/A 11/10/2022   Procedure: ESOPHAGOGASTRODUODENOSCOPY (EGD) WITH PROPOFOL ;  Surgeon: Shila Gustav GAILS, MD;  Location: [facility];  Service: Gastroenterology;  Laterality: N/A;   ESOPHAGOGASTRODUODENOSCOPY (EGD) WITH PROPOFOL  N/A 03/09/2023   Procedure: ESOPHAGOGASTRODUODENOSCOPY (EGD)  WITH PROPOFOL ;  Surgeon: Shila Gustav GAILS, MD;  Location: [facility];  Service: Gastroenterology;  Laterality: N/A;   HEMOSTASIS CLIP PLACEMENT  11/10/2022   Procedure: HEMOSTASIS CLIP PLACEMENT;  Surgeon: Shila Gustav GAILS, MD;  Location: [facility];  Service: Gastroenterology;;   heriorraphy     x2   HERNIA REPAIR     INTERCOSTAL NERVE BLOCK Left 08/16/2019   Procedure: Intercostal Nerve Block;  Surgeon: Shyrl Linnie KIDD, MD;  Location: [facility];  Service: Thoracic;  Laterality: Left;   LUNG LOBECTOMY Left    LLL   MIDDLE EAR SURGERY     TM replacement   NASAL SINUS SURGERY  1994, [id number],[id number]   X3   NODE DISSECTION  08/16/2019   Procedure: Node Dissection;  Surgeon: Shyrl Linnie KIDD, MD;  Location: [facility];  Service: Thoracic;;   POLYPECTOMY  11/10/2022   Procedure: POLYPECTOMY;  Surgeon: Shila Gustav GAILS, MD;  Location: [facility];  Service: Gastroenterology;;   TONSILLECTOMY AND ADENOIDECTOMY     VIDEO BRONCHOSCOPY N/A 08/16/2019   Procedure: VIDEO BRONCHOSCOPY;  Surgeon: Shyrl Linnie KIDD, MD;  Location: [facility];  Service: Thoracic;  Laterality: N/A;    Prior to Admission medications  Medication Sig Start Date End Date Taking? Authorizing Provider  aspirin  EC 81 MG tablet Take 1 tablet (81 mg total) by mouth daily. Swallow whole. 06/21/20   Pietro Redell RAMAN, MD  Cholecalciferol (VITAMIN D-3 PO) Take by mouth daily. Patient not taking: Reported on 03/22/2024  [provider]  Cyanocobalamin  (VITAMIN B-12 IJ) Inject 1,000 mcg as directed every 30 (thirty) days.    [provider]  ferrous sulfate  (FEROSUL) 325 (65 FE) MG tablet Take 1 tablet (325 mg total) by mouth daily with breakfast. 05/21/23   Federico Norleen ONEIDA MADISON, MD  JARDIANCE  25 MG TABS tablet TAKE 1 TABLET DAILY 08/10/23   Geofm Glade PARAS, MD  labetalol  (NORMODYNE ) 200 MG tablet Take 1 tablet (200 mg total) by mouth 2 (two) times daily. 03/23/24   Pietro Redell RAMAN, MD  metFORMIN  (GLUCOPHAGE ) 500  MG tablet TAKE TWO TABLETS BY MOUTH TWICE DAILY WITH FOOD 03/25/24   Geofm Glade PARAS, MD  rosuvastatin  (CRESTOR ) 40 MG tablet TAKE ONE TABLET EVERY DAY 03/14/24   Pietro Redell RAMAN, MD  RYBELSUS  14 MG TABS TAKE 1 TABLET(14 MG) BY MOUTH DAILY 01/13/24   Geofm Glade PARAS, MD  spironolactone  (ALDACTONE ) 25 MG tablet Take 0.5 tablets (12.5 mg total) by mouth daily. 06/29/23   Geofm Glade PARAS, MD  tamsulosin  (FLOMAX ) 0.4 MG CAPS capsule Take 1 capsule (0.4 mg total) by mouth daily. 12/28/23   Geofm Glade PARAS, MD  telmisartan  (MICARDIS ) 80 MG tablet Take 1 tablet (80 mg total) by mouth daily. 03/21/24   Pietro Redell RAMAN, MD    Current Outpatient Medications  Medication Sig Dispense Refill   aspirin  EC 81 MG tablet Take 1 tablet (81 mg total) by mouth daily. Swallow whole. 90 tablet 3   Cholecalciferol (VITAMIN D-3 PO) Take by mouth daily. (Patient not taking: Reported on 03/22/2024)     Cyanocobalamin  (VITAMIN B-12 IJ) Inject 1,000 mcg as directed every 30 (thirty) days.     ferrous sulfate  (FEROSUL) 325 (65 FE) MG tablet Take 1 tablet (325 mg total) by mouth daily with breakfast. 90 tablet 2   JARDIANCE  25 MG TABS tablet TAKE 1 TABLET DAILY 90 tablet 3   labetalol  (NORMODYNE ) 200 MG tablet Take 1 tablet (200 mg total) by mouth 2 (two) times daily. 60 tablet 0   metFORMIN  (GLUCOPHAGE ) 500 MG tablet TAKE TWO TABLETS BY MOUTH TWICE DAILY WITH FOOD 360 tablet 1   rosuvastatin  (CRESTOR ) 40 MG tablet TAKE ONE TABLET EVERY DAY 30 tablet 0   RYBELSUS  14 MG TABS TAKE 1 TABLET(14 MG) BY MOUTH DAILY 30 tablet 2   spironolactone  (ALDACTONE ) 25 MG tablet Take 0.5 tablets (12.5 mg total) by mouth daily. 45 tablet 1   tamsulosin  (FLOMAX ) 0.4 MG CAPS capsule Take 1 capsule (0.4 mg total) by mouth daily. 30 capsule 3   telmisartan  (MICARDIS ) 80 MG tablet Take 1 tablet (80 mg total) by mouth daily. 90 tablet 0   Current Facility-Administered Medications  Medication Dose Route Frequency Provider Last Rate Last Admin   0.9 %   sodium chloride  infusion  500 mL Intravenous Once Albena Comes V, MD        Allergies as of 04/05/2024 - Review Complete 04/05/2024  Allergen Reaction Noted   Diltiazem hcl Cough 01/19/2018   Penicillins      Family History  Problem Relation Age of Onset   Cancer Mother        liver &multiple myeloma   Heart disease Mother        CABG, Angina   Hyperlipidemia Mother    Hypertension Father    Heart disease Father        MI in 69s   Dementia Father    COPD Sister    Other Sister  perforated bowel   Other Sister        cns aneurysm   Cancer Maternal Grandfather        bone   Diabetes Neg Hx    Stroke Neg Hx    Colon cancer Neg Hx    Colon polyps Neg Hx    Stomach cancer Neg Hx    Rectal cancer Neg Hx     Social History   Socioeconomic History   Marital status: Married    Spouse name: [place]   Number of children: 2   Years of education: 18   Highest education level: Master's degree (e.g., MA, MS, MEng, MEd, MSW, MBA)  Occupational History   Occupation: Retired    Associate Professor: RETIRED  Tobacco Use   Smoking status: Never    Passive exposure: Past   Smokeless tobacco: Never  Vaping Use   Vaping status: Never Used  Substance and Sexual Activity   Alcohol use: Yes    Alcohol/week: 2.0 standard drinks of alcohol    Types: 1 Glasses of wine, 1 Shots of liquor per week    Comment: socially < 2/ week   Drug use: No   Sexual activity: Yes  Other Topics Concern   Not on file  Social History Narrative   Fun: Water, swimming, [employer]; Volunteer work    Denies abuse and feels safe at home.   Regular exercise      Lives with wife      Social Drivers of Health   Tobacco Use: Low Risk (03/22/2024)   Patient History    Smoking Tobacco Use: Never    Smokeless Tobacco Use: Never    Passive Exposure: Past  Financial Resource Strain: Low Risk (06/16/2023)   Overall Financial Resource Strain (CARDIA)    Difficulty of Paying Living Expenses: Not hard at  all  Food Insecurity: No Food Insecurity (06/16/2023)   Hunger Vital Sign    Worried About Running Out of Food in the Last Year: Never true    Ran Out of Food in the Last Year: Never true  Transportation Needs: No Transportation Needs (06/16/2023)   PRAPARE - Administrator, Civil Service (Medical): No    Lack of Transportation (Non-Medical): No  Physical Activity: Sufficiently Active (06/16/2023)   Exercise Vital Sign    Days of Exercise per Week: 3 days    Minutes of Exercise per Session: 60 min  Stress: No Stress Concern Present (06/16/2023)   [employer] of Occupational Health - Occupational Stress Questionnaire    Feeling of Stress : Not at all  Social Connections: Socially Integrated (06/16/2023)   Social Connection and Isolation Panel    Frequency of Communication with Friends and Family: More than three times a week    Frequency of Social Gatherings with Friends and Family: More than three times a week    Attends Religious Services: More than 4 times per year    Active Member of Clubs or Organizations: Yes    Attends Banker Meetings: More than 4 times per year    Marital Status: Married  Catering Manager Violence: Not At Risk (06/23/2023)   Humiliation, Afraid, Rape, and Kick questionnaire    Fear of Current or Ex-Partner: No    Emotionally Abused: No    Physically Abused: No    Sexually Abused: No  Depression (PHQ2-9): Low Risk (12/28/2023)   Depression (PHQ2-9)    PHQ-2 Score: 1  Alcohol Screen: Low Risk (06/16/2023)   Alcohol Screen  Last Alcohol Screening Score (AUDIT): 2  Housing: Low Risk (06/16/2023)   Housing Stability Vital Sign    Unable to Pay for Housing in the Last Year: No    Number of Times Moved in the Last Year: 0    Homeless in the Last Year: No  Utilities: Not At Risk (06/23/2023)   AHC Utilities    Threatened with loss of utilities: No  Health Literacy: Adequate Health Literacy (06/23/2023)   B1300 Health Literacy     Frequency of need for help with medical instructions: Never    Review of Systems:  All other review of systems negative except as mentioned in the HPI.  Physical Exam: Vital signs in last 24 hours: BP 125/73   Pulse 70   Temp (!) 97.3 F (36.3 C)   Ht 5' 10 (1.778 m)   Wt 212 lb (96.2 kg)   SpO2 96%   BMI 30.42 kg/m  General:   Alert, NAD Lungs:  Clear .   Heart:  Regular rate and rhythm Abdomen:  Soft, nontender and nondistended. Neuro/Psych:  Alert and cooperative. Normal mood and affect. A and O x 3  Reviewed labs, radiology imaging, old records and pertinent past GI work up  Patient is appropriate for planned procedure(s) and anesthesia in an ambulatory setting   K. Veena Pierre Dellarocco , MD [phone]

## 2024-04-05 NOTE — Progress Notes (Unsigned)
 Called to room to assist during endoscopic procedure.  Patient ID and intended procedure confirmed with present staff. Received instructions for my participation in the procedure from the performing physician.

## 2024-04-05 NOTE — Progress Notes (Unsigned)
 Pt's states no medical or surgical changes since previsit or office visit.

## 2024-04-05 NOTE — Patient Instructions (Signed)

## 2024-04-05 NOTE — Progress Notes (Unsigned)
 Vss nad trans to pacu

## 2024-04-05 NOTE — Op Note (Signed)
 [facility] Patient Name: Jonathon Armstrong Procedure Date: 04/05/2024 11:30 AM MRN: [id number] Endoscopist: Gustav ALONSO Mcgee , MD, [id number] Age: 74 Referring MD:  Date of Birth: [birth year] Gender: Male Account #: [id number] Procedure:                Upper GI endoscopy Indications:              Surveillance for malignancy due to personal history                            of Barrett's esophagus, Follow-up of reflux                            esophagitis Medicines:                Monitored Anesthesia Care Procedure:                Pre-Anesthesia Assessment:                           - Prior to the procedure, a History and Physical                            was performed, and patient medications and                            allergies were reviewed. The patient's tolerance of                            previous anesthesia was also reviewed. The risks                            and benefits of the procedure and the sedation                            options and risks were discussed with the patient.                            All questions were answered, and informed consent                            was obtained. Prior Anticoagulants: The patient has                            taken no anticoagulant or antiplatelet agents. ASA                            Grade Assessment: III - A patient with severe                            systemic disease. After reviewing the risks and                            benefits, the patient was deemed in satisfactory  condition to undergo the procedure.                           After obtaining informed consent, the endoscope was                            passed under direct vision. Throughout the                            procedure, the patient's blood pressure, pulse, and                            oxygen  saturations were monitored continuously. The                            Olympus Scope [id number] was  introduced through the                            mouth, and advanced to the second part of duodenum.                            The upper GI endoscopy was accomplished without                            difficulty. The patient tolerated the procedure                            well. Scope In: Scope Out: Findings:                 There were esophageal mucosal changes consistent                            with short-segment Barrett's esophagus present in                            the lower third of the esophagus. The maximum                            longitudinal extent of these mucosal changes was 3                            cm in length. Mucosa was biopsied with a cold                            forceps for histology in a targeted manner from 37                            to 40 cm from the incisors. One specimen bottle was                            sent to pathology.                           Patchy  mild inflammation characterized by                            congestion (edema), erythema and friability was                            found in the gastric antrum.                           The cardia and gastric fundus were normal on                            retroflexion.                           The examined duodenum was normal. Complications:            No immediate complications. Estimated Blood Loss:     Estimated blood loss was minimal. Impression:               - Esophageal mucosal changes consistent with                            short-segment Barrett's esophagus. Biopsied.                           - Gastritis.                           - Normal examined duodenum. Recommendation:           - Resume previous diet.                           - Continue present medications.                           - Await pathology results.                           - Follow an antireflux regimen.                           - Use Protonix  (pantoprazole ) 40 mg PO daily. Rx                             for 90 days with 3 refills                           - Repeat upper endoscopy in 3 - 5 years for                            surveillance of Barrett's esophagus. Jonathon Armstrong V. Jonathon Mustin, MD 04/05/2024 11:57:15 AM This report has been signed electronically.

## 2024-04-05 NOTE — Progress Notes (Signed)
 "    HPI: FU hyperlipidemia and CAD. I saw him previously for bradycardia in May 08, 2006. Holter monitor showed sinus rhythm with brief PAT and PVC. Last echocardiogram 05-08-2008 showed normal LV function and mild mitral regurgitation.  Calcium  score January 2021 1368.  There was also note of a 13 mm posterior left lower lobe nodule and 57-month repeat chest CT, PET/CT or tissue sampling recommended. Nuclear study January 2021 showed ejection fraction 58% with normal perfusion.  Patient subsequently had left lower lobe lobectomy for lung cancer.  Chest CT July 2025 showed no evidence of recurrent or metastatic disease, dilated ascending aorta at 4 cm.  Since last seen the patient denies any dyspnea on exertion, orthopnea, PND, pedal edema, palpitations, syncope or chest pain.   Current Outpatient Medications  Medication Sig Dispense Refill   aspirin  EC 81 MG tablet Take 1 tablet (81 mg total) by mouth daily. Swallow whole. 90 tablet 3   Cyanocobalamin  (VITAMIN B-12 IJ) Inject 1,000 mcg as directed every 30 (thirty) days.     ferrous sulfate  (FEROSUL) 325 (65 FE) MG tablet Take 1 tablet (325 mg total) by mouth daily with breakfast. 90 tablet 2   JARDIANCE  25 MG TABS tablet TAKE 1 TABLET DAILY 90 tablet 3   labetalol  (NORMODYNE ) 200 MG tablet Take 1 tablet (200 mg total) by mouth 2 (two) times daily. 60 tablet 0   metFORMIN  (GLUCOPHAGE ) 500 MG tablet TAKE TWO TABLETS BY MOUTH TWICE DAILY WITH FOOD 360 tablet 1   pantoprazole  (PROTONIX ) 40 MG tablet Take 1 tablet (40 mg total) by mouth daily. 90 tablet 3   rosuvastatin  (CRESTOR ) 40 MG tablet TAKE ONE TABLET EVERY DAY 30 tablet 0   RYBELSUS  14 MG TABS TAKE 1 TABLET(14 MG) BY MOUTH DAILY 30 tablet 2   spironolactone  (ALDACTONE ) 25 MG tablet Take 0.5 tablets (12.5 mg total) by mouth daily. 45 tablet 1   telmisartan  (MICARDIS ) 80 MG tablet Take 1 tablet (80 mg total) by mouth daily. 90 tablet 0   No current facility-administered medications for this visit.      Past Medical History:  Diagnosis Date   Allergy    seasonal   Asthma 2017/05/08   Asystole (HCC)    vagal responsepost nasal polypectomy   Cancer (HCC) 05/09/2019   Lobe on lower left lung removed   Complication of anesthesia 05/08/2004   Coded x2 after nasal polyp removal (Believes vagal nerve response)   Diabetes mellitus without complication (HCC)    pre but take metformin    Diverticulosis of colon    FH: colonic polyps    hx of   Hyperlipidemia    Hypertension    Mitral regurgitation    mild & aortic Valve calcification on 2d echo;sbe prophylaxis   Parotitis, acute 05-09-2011   Seasonal rhinitis    Sleep apnea    CPAP    Past Surgical History:  Procedure Laterality Date   APPENDECTOMY     asytole post nasal polypectomy  03/24/2006   BIOPSY  03/09/2023   Procedure: BIOPSY;  Surgeon: Shila Gustav GAILS, MD;  Location: [facility];  Service: Gastroenterology;;   colonoscopy with polypectomy  03/24/2008   Dr Obie   ESOPHAGOGASTRODUODENOSCOPY (EGD) WITH PROPOFOL  N/A 11/10/2022   Procedure: ESOPHAGOGASTRODUODENOSCOPY (EGD) WITH PROPOFOL ;  Surgeon: Shila Gustav GAILS, MD;  Location: [facility];  Service: Gastroenterology;  Laterality: N/A;   ESOPHAGOGASTRODUODENOSCOPY (EGD) WITH PROPOFOL  N/A 03/09/2023   Procedure: ESOPHAGOGASTRODUODENOSCOPY (EGD) WITH PROPOFOL ;  Surgeon: Shila Gustav GAILS, MD;  Location: [facility];  Service: Gastroenterology;  Laterality: N/A;   HEMOSTASIS CLIP PLACEMENT  11/10/2022   Procedure: HEMOSTASIS CLIP PLACEMENT;  Surgeon: Shila Gustav GAILS, MD;  Location: [facility];  Service: Gastroenterology;;   heriorraphy     x2   HERNIA REPAIR     INTERCOSTAL NERVE BLOCK Left 08/16/2019   Procedure: Intercostal Nerve Block;  Surgeon: Shyrl Linnie KIDD, MD;  Location: [facility];  Service: Thoracic;  Laterality: Left;   LUNG LOBECTOMY Left    LLL   MIDDLE EAR SURGERY     TM replacement   NASAL SINUS SURGERY  1994, [id number],[id number]   X3   NODE DISSECTION   08/16/2019   Procedure: Node Dissection;  Surgeon: Shyrl Linnie KIDD, MD;  Location: [facility];  Service: Thoracic;;   POLYPECTOMY  11/10/2022   Procedure: POLYPECTOMY;  Surgeon: Shila Gustav GAILS, MD;  Location: [facility];  Service: Gastroenterology;;   TONSILLECTOMY AND ADENOIDECTOMY     VIDEO BRONCHOSCOPY N/A 08/16/2019   Procedure: VIDEO BRONCHOSCOPY;  Surgeon: Shyrl Linnie KIDD, MD;  Location: [facility];  Service: Thoracic;  Laterality: N/A;    Social History   Socioeconomic History   Marital status: Married    Spouse name: [place]   Number of children: 2   Years of education: 18   Highest education level: Master's degree (e.g., MA, MS, MEng, MEd, MSW, MBA)  Occupational History   Occupation: Retired    Associate Professor: RETIRED  Tobacco Use   Smoking status: Never    Passive exposure: Past   Smokeless tobacco: Never  Vaping Use   Vaping status: Never Used  Substance and Sexual Activity   Alcohol use: Yes    Alcohol/week: 2.0 standard drinks of alcohol    Types: 1 Glasses of wine, 1 Shots of liquor per week    Comment: socially < 2/ week   Drug use: No   Sexual activity: Yes  Other Topics Concern   Not on file  Social History Narrative   Fun: Water, swimming, [employer]; Volunteer work    Denies abuse and feels safe at home.   Regular exercise      Lives with wife      Social Drivers of Health   Tobacco Use: Low Risk (04/19/2024)   Patient History    Smoking Tobacco Use: Never    Smokeless Tobacco Use: Never    Passive Exposure: Past  Financial Resource Strain: Low Risk (04/11/2024)   Overall Financial Resource Strain (CARDIA)    Difficulty of Paying Living Expenses: Not hard at all  Food Insecurity: No Food Insecurity (04/11/2024)   Epic    Worried About Programme Researcher, Broadcasting/film/video in the Last Year: Never true    Ran Out of Food in the Last Year: Never true  Transportation Needs: No Transportation Needs (04/11/2024)   Epic    Lack of Transportation (Medical): No    Lack  of Transportation (Non-Medical): No  Physical Activity: Sufficiently Active (04/11/2024)   Exercise Vital Sign    Days of Exercise per Week: 3 days    Minutes of Exercise per Session: 50 min  Stress: No Stress Concern Present (04/11/2024)   [employer] of Occupational Health - Occupational Stress Questionnaire    Feeling of Stress: Not at all  Social Connections: Socially Integrated (04/11/2024)   Social Connection and Isolation Panel    Frequency of Communication with Friends and Family: More than three times a week    Frequency of Social Gatherings with Friends and Family: Three times  a week    Attends Religious Services: More than 4 times per year    Active Member of Clubs or Organizations: Yes    Attends Banker Meetings: More than 4 times per year    Marital Status: Married  Catering Manager Violence: Not At Risk (06/23/2023)   Humiliation, Afraid, Rape, and Kick questionnaire    Fear of Current or Ex-Partner: No    Emotionally Abused: No    Physically Abused: No    Sexually Abused: No  Depression (PHQ2-9): Low Risk (04/12/2024)   Depression (PHQ2-9)    PHQ-2 Score: 0  Alcohol Screen: Low Risk (04/11/2024)   Alcohol Screen    Last Alcohol Screening Score (AUDIT): 2  Housing: Low Risk (04/11/2024)   Epic    Unable to Pay for Housing in the Last Year: No    Number of Times Moved in the Last Year: 0    Homeless in the Last Year: No  Utilities: Not At Risk (06/23/2023)   [employer] Utilities    Threatened with loss of utilities: No  Health Literacy: Adequate Health Literacy (06/23/2023)   B1300 Health Literacy    Frequency of need for help with medical instructions: Never    Family History  Problem Relation Age of Onset   Cancer Mother        liver &multiple myeloma   Heart disease Mother        CABG, Angina   Hyperlipidemia Mother    Hypertension Father    Heart disease Father        MI in 35s   Dementia Father    COPD Sister    Other Sister        perforated  bowel   Other Sister        cns aneurysm   Cancer Maternal Grandfather        bone   Diabetes Neg Hx    Stroke Neg Hx    Colon cancer Neg Hx    Colon polyps Neg Hx    Stomach cancer Neg Hx    Rectal cancer Neg Hx     ROS: no fevers or chills, productive cough, hemoptysis, dysphasia, odynophagia, melena, hematochezia, dysuria, hematuria, rash, seizure activity, orthopnea, PND, pedal edema, claudication. Remaining systems are negative.  Physical Exam: Well-developed well-nourished in no acute distress.  Skin is warm and dry.  HEENT is normal.  Neck is supple.  Chest is clear to auscultation with normal expansion.  Cardiovascular exam is regular rate and rhythm.  Abdominal exam nontender or distended. No masses palpated. Extremities show no edema. neuro grossly intact  EKG Interpretation Date/Time:  Tuesday April 19 2024 09:52:53 EST Ventricular Rate:  67 PR Interval:  204 QRS Duration:  68 QT Interval:  394 QTC Calculation: 416 R Axis:   73  Text Interpretation: Normal sinus rhythm Low voltage QRS Confirmed by Pietro Rogue ([id number]) on 04/19/2024 9:56:33 AM    A/P  1 coronary artery disease/coronary calcification-patient denies chest pain and follow-up functional study previously showed no ischemia.  Plan to continue medical therapy with aspirin  and statin.  2 hyperlipidemia-continue statin.  3 hypertension-blood pressure controlled.  Continue present medications.  4 prior bradycardia-occurred in the postoperative setting with no documented recurrences and no history of syncope.  Rogue Pietro, MD    "

## 2024-04-06 ENCOUNTER — Telehealth: Payer: Self-pay

## 2024-04-06 NOTE — Telephone Encounter (Signed)
" °  Follow up Call-     04/05/2024   10:51 AM 06/18/2022    2:19 PM  Call back number  Post procedure Call Back phone  # [phone]  Permission to leave phone message Yes Yes     Patient questions:  Do you have a fever, pain , or abdominal swelling? No. Pain Score  0 *  Have you tolerated food without any problems? Yes.    Have you been able to return to your normal activities? Yes.    Do you have any questions about your discharge instructions: Diet   No. Medications  No. Follow up visit  No.  Do you have questions or concerns about your Care? No.  Actions: * If pain score is 4 or above: No action needed, pain <4.   "

## 2024-04-07 LAB — SURGICAL PATHOLOGY

## 2024-04-08 ENCOUNTER — Ambulatory Visit: Payer: Self-pay | Admitting: Gastroenterology

## 2024-04-11 ENCOUNTER — Other Ambulatory Visit: Payer: Self-pay | Admitting: Internal Medicine

## 2024-04-12 ENCOUNTER — Encounter: Payer: Self-pay | Admitting: Internal Medicine

## 2024-04-12 ENCOUNTER — Ambulatory Visit: Admitting: Internal Medicine

## 2024-04-12 VITALS — BP 110/72 | HR 80 | Temp 98.4°F | Ht 70.0 in | Wt 208.0 lb

## 2024-04-12 DIAGNOSIS — G4733 Obstructive sleep apnea (adult) (pediatric): Secondary | ICD-10-CM | POA: Diagnosis not present

## 2024-04-12 DIAGNOSIS — D508 Other iron deficiency anemias: Secondary | ICD-10-CM

## 2024-04-12 DIAGNOSIS — N1831 Chronic kidney disease, stage 3a: Secondary | ICD-10-CM | POA: Insufficient documentation

## 2024-04-12 DIAGNOSIS — K219 Gastro-esophageal reflux disease without esophagitis: Secondary | ICD-10-CM

## 2024-04-12 DIAGNOSIS — E538 Deficiency of other specified B group vitamins: Secondary | ICD-10-CM | POA: Diagnosis not present

## 2024-04-12 DIAGNOSIS — E785 Hyperlipidemia, unspecified: Secondary | ICD-10-CM | POA: Diagnosis not present

## 2024-04-12 DIAGNOSIS — I152 Hypertension secondary to endocrine disorders: Secondary | ICD-10-CM | POA: Diagnosis not present

## 2024-04-12 DIAGNOSIS — E1169 Type 2 diabetes mellitus with other specified complication: Secondary | ICD-10-CM

## 2024-04-12 DIAGNOSIS — Z7984 Long term (current) use of oral hypoglycemic drugs: Secondary | ICD-10-CM | POA: Diagnosis not present

## 2024-04-12 DIAGNOSIS — Z87442 Personal history of urinary calculi: Secondary | ICD-10-CM | POA: Insufficient documentation

## 2024-04-12 DIAGNOSIS — E1159 Type 2 diabetes mellitus with other circulatory complications: Secondary | ICD-10-CM | POA: Diagnosis not present

## 2024-04-12 LAB — MICROALBUMIN / CREATININE URINE RATIO
Creatinine,U: 54.2 mg/dL
Microalb Creat Ratio: 20.4 mg/g (ref 0.0–30.0)
Microalb, Ur: 1.1 mg/dL (ref 0.7–1.9)

## 2024-04-12 LAB — COMPREHENSIVE METABOLIC PANEL WITH GFR
ALT: 23 U/L (ref 3–53)
AST: 22 U/L (ref 5–37)
Albumin: 4.6 g/dL (ref 3.5–5.2)
Alkaline Phosphatase: 59 U/L (ref 39–117)
BUN: 21 mg/dL (ref 6–23)
CO2: 27 meq/L (ref 19–32)
Calcium: 10 mg/dL (ref 8.4–10.5)
Chloride: 106 meq/L (ref 96–112)
Creatinine, Ser: 1.33 mg/dL (ref 0.40–1.50)
GFR: 52.58 mL/min — ABNORMAL LOW
Glucose, Bld: 160 mg/dL — ABNORMAL HIGH (ref 70–99)
Potassium: 4.3 meq/L (ref 3.5–5.1)
Sodium: 140 meq/L (ref 135–145)
Total Bilirubin: 0.4 mg/dL (ref 0.2–1.2)
Total Protein: 7 g/dL (ref 6.0–8.3)

## 2024-04-12 LAB — CBC
HCT: 34.1 % — ABNORMAL LOW (ref 39.0–52.0)
Hemoglobin: 11.7 g/dL — ABNORMAL LOW (ref 13.0–17.0)
MCHC: 34.3 g/dL (ref 30.0–36.0)
MCV: 89 fl (ref 78.0–100.0)
Platelets: 149 K/uL — ABNORMAL LOW (ref 150.0–400.0)
RBC: 3.83 Mil/uL — ABNORMAL LOW (ref 4.22–5.81)
RDW: 15 % (ref 11.5–15.5)
WBC: 5.2 K/uL (ref 4.0–10.5)

## 2024-04-12 LAB — LIPID PANEL
Cholesterol: 74 mg/dL (ref 28–200)
HDL: 29.1 mg/dL — ABNORMAL LOW
LDL Cholesterol: -2 mg/dL — ABNORMAL LOW (ref 10–99)
NonHDL: 45.27
Total CHOL/HDL Ratio: 3
Triglycerides: 237 mg/dL — ABNORMAL HIGH (ref 10.0–149.0)
VLDL: 47.4 mg/dL — ABNORMAL HIGH (ref 0.0–40.0)

## 2024-04-12 LAB — HEMOGLOBIN A1C: Hgb A1c MFr Bld: 7 % — ABNORMAL HIGH (ref 4.6–6.5)

## 2024-04-12 LAB — VITAMIN B12: Vitamin B-12: >1500 pg/mL — ABNORMAL HIGH (ref 211–911)

## 2024-04-12 LAB — TSH: TSH: 1.24 u[IU]/mL (ref 0.35–5.50)

## 2024-04-12 MED ORDER — CYANOCOBALAMIN 1000 MCG/ML IJ SOLN
1000.0000 ug | Freq: Once | INTRAMUSCULAR | Status: AC
  Administered 2024-04-12: 1000 ug via INTRAMUSCULAR

## 2024-04-12 NOTE — Patient Instructions (Addendum)
" ° °  B12 injection today   Blood work was ordered.       Medications changes include :   None   Start exercising regularly.     Return in about 6 months (around 10/10/2024) for follow up with me,  one month B12 injection.  "

## 2024-04-12 NOTE — Assessment & Plan Note (Signed)
Chronic Using cpap nightly 

## 2024-04-12 NOTE — Assessment & Plan Note (Addendum)
 Chronic Associated with hyperlipidemia, hypertension Lab Results  Component Value Date   HGBA1C 7.1 (H) 06/29/2023   Sugars not ideally controlled at last visit-goal less than 7.0% Has not been as compliant with a diabetic diet so feels that sugars will be higher Check A1c Continue Jardiance  25 mg daily, metformin  1000 mg twice daily and Rybelsus  14 mg daily Stressed regular exercise, diabetic diet

## 2024-04-12 NOTE — Assessment & Plan Note (Signed)
 Chronic Mild, stage IIIa CBC, CMP, urine albumin/creatinine ratio Avoid NSAIDs, good water intake Low sugar, sodium diet Stressed good BP and sugar control

## 2024-04-12 NOTE — Progress Notes (Signed)
 This     Subjective:    Patient ID: Jonathon Armstrong, male    DOB: 09/20/1950, 74 y.o.   MRN: [id number]     HPI Jonathon Armstrong is here for follow up of his chronic medical problems.  Discussed the use of AI scribe software for clinical note transcription with the patient, who gave verbal consent to proceed.  History of Present Illness Jonathon Armstrong is a 74 year old male who presents for follow-up and medication renewal.  He has a history of kidney stones, with a previous episode causing significant obstruction at the bladder. During that episode, he was prescribed medication to increase urination, a pain pill, and an antibiotic. He experienced mild pain during the night but felt fine by morning. There has been no recurrence of symptoms since then.  He has a history of Achilles tendon issues and was prescribed nitroglycerin patches to increase blood flow to the area. He continues to use a heel insert and reports improvement in symptoms.  He underwent a follow-up endoscopy where a previously removed polyp site was found to have healed normally, with no new polyps observed. He was prescribed a year's supply of anti-acid reflux medication after his doctor explained that he had irritation in his esophagus and precancerous changes, even though he does not experience typical acid reflux symptoms.  He mentions a past lung episode, with a scan last summer showing no new nodules. He was told that the upper lobe was elongating and that there were no new growths on his most recent scan.  He uses a CPAP machine nightly and has faced challenges with equipment suppliers. He has managed to find a new supplier for necessary supplies. No issues with fevers, coughing, wheezing, shortness of breath, chest pain, palpitations, leg swelling, headaches, or lightheadedness.  His exercise routine has been inconsistent, particularly in the fall and winter. He attempts to walk on trails with his wife, who  can manage flat surfaces but is limited to short distances due to her condition. He acknowledges stress from caregiving responsibilities and admits to stress eating.  He is due for a B12 injection, which he has not received in several months. He prefers injections over oral supplements and plans to resume monthly injections.    Medications and allergies reviewed with patient and updated if appropriate.  Medications Ordered Prior to Encounter[1]   Review of Systems  Constitutional:  Negative for fever.  Respiratory:  Negative for cough, shortness of breath and wheezing.   Cardiovascular:  Negative for chest pain, palpitations and leg swelling.  Neurological:  Negative for light-headedness and headaches.       Objective:   Vitals:   04/12/24 1322  BP: 110/72  Pulse: 80  Temp: 98.4 F (36.9 C)  SpO2: 99%   BP Readings from Last 3 Encounters:  04/12/24 110/72  04/05/24 122/66  12/28/23 120/70   Wt Readings from Last 3 Encounters:  04/12/24 208 lb (94.3 kg)  04/05/24 212 lb (96.2 kg)  03/22/24 212 lb (96.2 kg)   Body mass index is 29.84 kg/m.    Physical Exam Constitutional:      General: He is not in acute distress.    Appearance: Normal appearance. He is not ill-appearing.  HENT:     Head: Normocephalic and atraumatic.  Eyes:     Conjunctiva/sclera: Conjunctivae normal.  Cardiovascular:     Rate and Rhythm: Normal rate and regular rhythm.     Heart sounds: Normal heart sounds.  Pulmonary:  Effort: Pulmonary effort is normal. No respiratory distress.     Breath sounds: Normal breath sounds. No wheezing or rales.  Musculoskeletal:     Right lower leg: No edema.     Left lower leg: No edema.  Skin:    General: Skin is warm and dry.     Findings: No rash.  Neurological:     Mental Status: He is alert. Mental status is at baseline.  Psychiatric:        Mood and Affect: Mood normal.        Lab Results  Component Value Date   WBC 8.2 12/28/2023   HGB  11.5 (L) 12/28/2023   HCT 33.8 (L) 12/28/2023   PLT 150.0 12/28/2023   GLUCOSE 125 (H) 12/28/2023   CHOL 84 06/29/2023   TRIG 208.0 (H) 06/29/2023   HDL 31.40 (L) 06/29/2023   LDLDIRECT 27.0 02/25/2022   LDLCALC 11 06/29/2023   ALT 16 12/28/2023   AST 15 12/28/2023   NA 142 12/28/2023   K 4.5 12/28/2023   CL 105 12/28/2023   CREATININE 1.63 (H) 12/28/2023   BUN 26 (H) 12/28/2023   CO2 24 12/28/2023   TSH 1.41 11/12/2018   PSA 2.61 11/12/2018   INR 1.0 08/15/2019   HGBA1C 7.1 (H) 06/29/2023   MICROALBUR 0.6 11/13/2008     Assessment & Plan:    See Problem List for Assessment and Plan of chronic medical problems.       [1]  Current Outpatient Medications on File Prior to Visit  Medication Sig Dispense Refill   aspirin  EC 81 MG tablet Take 1 tablet (81 mg total) by mouth daily. Swallow whole. 90 tablet 3   Cyanocobalamin  (VITAMIN B-12 IJ) Inject 1,000 mcg as directed every 30 (thirty) days.     ferrous sulfate  (FEROSUL) 325 (65 FE) MG tablet Take 1 tablet (325 mg total) by mouth daily with breakfast. 90 tablet 2   JARDIANCE  25 MG TABS tablet TAKE 1 TABLET DAILY 90 tablet 3   labetalol  (NORMODYNE ) 200 MG tablet Take 1 tablet (200 mg total) by mouth 2 (two) times daily. 60 tablet 0   metFORMIN  (GLUCOPHAGE ) 500 MG tablet TAKE TWO TABLETS BY MOUTH TWICE DAILY WITH FOOD 360 tablet 1   pantoprazole  (PROTONIX ) 40 MG tablet Take 1 tablet (40 mg total) by mouth daily. 90 tablet 3   rosuvastatin  (CRESTOR ) 40 MG tablet TAKE ONE TABLET EVERY DAY 30 tablet 0   RYBELSUS  14 MG TABS TAKE 1 TABLET(14 MG) BY MOUTH DAILY 30 tablet 2   spironolactone  (ALDACTONE ) 25 MG tablet Take 0.5 tablets (12.5 mg total) by mouth daily. 45 tablet 1   telmisartan  (MICARDIS ) 80 MG tablet Take 1 tablet (80 mg total) by mouth daily. 90 tablet 0   No current facility-administered medications on file prior to visit.

## 2024-04-12 NOTE — Assessment & Plan Note (Addendum)
 Chronic Mild, stable Iron deficiency - on oral iron Following with hematology

## 2024-04-12 NOTE — Assessment & Plan Note (Addendum)
 Chronic Blood pressure well controlled  CMP, CBC Continue labetalol  200 mg twice daily, telmisartan  80 mg daily,  spironolactone  to 12.5 mg daily

## 2024-04-12 NOTE — Assessment & Plan Note (Addendum)
 Chronic Following with GI Recent EGD with gastritis, Short segment of Barretts Esophagus GERD controlled - never had much GERD Continue pantoprazole  40 mg daily

## 2024-04-12 NOTE — Assessment & Plan Note (Signed)
 History of renal stone 12/2023 No symptoms consistent with active kidney stones Stressed increased hydration

## 2024-04-12 NOTE — Assessment & Plan Note (Addendum)
 Chronic Taking B12 injections monthly - overdue - will give one today Continue B12 supplementation

## 2024-04-12 NOTE — Assessment & Plan Note (Addendum)
 Chronic Regular exercise and healthy diet encouraged Lab Results  Component Value Date   LDLCALC 11 06/29/2023   LDL well-controlled Check lipid panel, CMP, TSH Continue Crestor  40 mg daily

## 2024-04-13 ENCOUNTER — Other Ambulatory Visit: Payer: Self-pay | Admitting: Cardiology

## 2024-04-13 DIAGNOSIS — R931 Abnormal findings on diagnostic imaging of heart and coronary circulation: Secondary | ICD-10-CM

## 2024-04-13 DIAGNOSIS — E7800 Pure hypercholesterolemia, unspecified: Secondary | ICD-10-CM

## 2024-04-14 ENCOUNTER — Ambulatory Visit: Payer: Self-pay | Admitting: Internal Medicine

## 2024-04-14 ENCOUNTER — Other Ambulatory Visit: Payer: Self-pay | Admitting: Internal Medicine

## 2024-04-19 ENCOUNTER — Ambulatory Visit: Admitting: Cardiology

## 2024-04-19 ENCOUNTER — Encounter: Payer: Self-pay | Admitting: Cardiology

## 2024-04-19 VITALS — BP 110/60 | HR 67 | Ht 70.0 in | Wt 217.8 lb

## 2024-04-19 DIAGNOSIS — R931 Abnormal findings on diagnostic imaging of heart and coronary circulation: Secondary | ICD-10-CM | POA: Diagnosis not present

## 2024-04-19 DIAGNOSIS — E785 Hyperlipidemia, unspecified: Secondary | ICD-10-CM | POA: Diagnosis not present

## 2024-04-19 DIAGNOSIS — E7800 Pure hypercholesterolemia, unspecified: Secondary | ICD-10-CM | POA: Insufficient documentation

## 2024-04-19 DIAGNOSIS — I10 Essential (primary) hypertension: Secondary | ICD-10-CM | POA: Insufficient documentation

## 2024-04-19 MED ORDER — ROSUVASTATIN CALCIUM 40 MG PO TABS: 40.0000 mg | ORAL_TABLET | Freq: Every day | ORAL | 3 refills | Status: AC

## 2024-04-19 NOTE — Patient Instructions (Signed)
 Medication Instructions:  No changes *If you need a refill on your cardiac medications before your next appointment, please call your pharmacy*  Lab Work: None ordered If you have labs (blood work) drawn today and your tests are completely normal, you will receive your results only by: MyChart Message (if you have MyChart) OR A paper copy in the mail If you have any lab test that is abnormal or we need to change your treatment, we will call you to review the results.  Testing/Procedures: None ordered  Follow-Up: At [facility], you and your health needs are our priority.  As part of our continuing mission to provide you with exceptional heart care, our providers are all part of one team.  This team includes your primary Cardiologist (physician) and Advanced Practice Providers or APPs (Physician Assistants and Nurse Practitioners) who all work together to provide you with the care you need, when you need it.  Your next appointment:   1 year(s)  Provider:   Redell Shallow, MD    We recommend signing up for the patient portal called MyChart.  Sign up information is provided on this After Visit Summary.  MyChart is used to connect with patients for Virtual Visits (Telemedicine).  Patients are able to view lab/test results, encounter notes, upcoming appointments, etc.  Non-urgent messages can be sent to your provider as well.   To learn more about what you can do with MyChart, go to [email or web address].

## 2024-04-20 NOTE — Telephone Encounter (Signed)
 Lipids Completed on 04/12/24

## 2024-04-21 ENCOUNTER — Other Ambulatory Visit: Payer: Self-pay | Admitting: Cardiology

## 2024-04-22 ENCOUNTER — Other Ambulatory Visit: Payer: Self-pay

## 2024-04-22 MED ORDER — LABETALOL HCL 200 MG PO TABS: 200.0000 mg | ORAL_TABLET | Freq: Two times a day (BID) | ORAL | 3 refills | Status: AC

## 2024-05-18 ENCOUNTER — Ambulatory Visit

## 2024-05-20 ENCOUNTER — Inpatient Hospital Stay

## 2024-05-20 ENCOUNTER — Inpatient Hospital Stay: Admitting: Physician Assistant

## 2024-06-23 ENCOUNTER — Ambulatory Visit

## 2024-07-12 ENCOUNTER — Ambulatory Visit

## 2024-10-12 ENCOUNTER — Ambulatory Visit: Admitting: Internal Medicine
